# Patient Record
Sex: Male | Born: 1955
Health system: Southern US, Community
[De-identification: ages and names within clinical notes are randomized; demographics above are authoritative.]

## PROBLEM LIST (undated history)

## (undated) DIAGNOSIS — Z5189 Encounter for other specified aftercare: Secondary | ICD-10-CM

## (undated) DIAGNOSIS — F329 Major depressive disorder, single episode, unspecified: Secondary | ICD-10-CM

## (undated) DIAGNOSIS — M1990 Unspecified osteoarthritis, unspecified site: Secondary | ICD-10-CM

## (undated) DIAGNOSIS — K219 Gastro-esophageal reflux disease without esophagitis: Secondary | ICD-10-CM

## (undated) DIAGNOSIS — N189 Chronic kidney disease, unspecified: Secondary | ICD-10-CM

## (undated) DIAGNOSIS — D649 Anemia, unspecified: Secondary | ICD-10-CM

## (undated) DIAGNOSIS — F419 Anxiety disorder, unspecified: Secondary | ICD-10-CM

## (undated) DIAGNOSIS — T7840XA Allergy, unspecified, initial encounter: Secondary | ICD-10-CM

## (undated) DIAGNOSIS — I10 Essential (primary) hypertension: Secondary | ICD-10-CM

## (undated) DIAGNOSIS — E785 Hyperlipidemia, unspecified: Secondary | ICD-10-CM

## (undated) DIAGNOSIS — H269 Unspecified cataract: Secondary | ICD-10-CM

## (undated) HISTORY — DX: Anxiety disorder, unspecified: F41.9

## (undated) HISTORY — DX: Hyperlipidemia, unspecified: E78.5

## (undated) HISTORY — DX: Allergy, unspecified, initial encounter: T78.40XA

## (undated) HISTORY — DX: Gastro-esophageal reflux disease without esophagitis: K21.9

## (undated) HISTORY — DX: Chronic kidney disease, unspecified: N18.9

## (undated) HISTORY — DX: Major depressive disorder, single episode, unspecified: F32.9

## (undated) HISTORY — DX: Unspecified osteoarthritis, unspecified site: M19.90

## (undated) HISTORY — DX: Anemia, unspecified: D64.9

## (undated) HISTORY — DX: Encounter for other specified aftercare: Z51.89

## (undated) HISTORY — PX: COLON SURGERY: SHX602

## (undated) HISTORY — DX: Unspecified cataract: H26.9

---
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
## 1999-11-09 DIAGNOSIS — F32A Depression, unspecified: Secondary | ICD-10-CM

## 1999-11-09 HISTORY — DX: Depression, unspecified: F32.A

## 2003-11-09 DIAGNOSIS — I10 Essential (primary) hypertension: Secondary | ICD-10-CM

## 2003-11-09 HISTORY — DX: Essential (primary) hypertension: I10

## 2004-11-08 HISTORY — PX: HERNIA REPAIR: SHX51

## 2011-05-08 ENCOUNTER — Emergency Department (HOSPITAL_COMMUNITY)
Admission: EM | Admit: 2011-05-08 | Discharge: 2011-05-09 | Disposition: A | Discharge status: Home / Self Care | Payer: Self-pay | Attending: Emergency Medicine | Admitting: Emergency Medicine

## 2011-05-08 DIAGNOSIS — L255 Unspecified contact dermatitis due to plants, except food: Secondary | ICD-10-CM | POA: Insufficient documentation

## 2011-05-08 DIAGNOSIS — T622X1A Toxic effect of other ingested (parts of) plant(s), accidental (unintentional), initial encounter: Secondary | ICD-10-CM | POA: Insufficient documentation

## 2011-05-08 DIAGNOSIS — I10 Essential (primary) hypertension: Secondary | ICD-10-CM | POA: Insufficient documentation

## 2011-05-08 DIAGNOSIS — E119 Type 2 diabetes mellitus without complications: Secondary | ICD-10-CM | POA: Insufficient documentation

## 2011-05-20 ENCOUNTER — Encounter: Payer: Self-pay | Admitting: *Deleted

## 2011-05-20 ENCOUNTER — Emergency Department (HOSPITAL_COMMUNITY)
Admission: EM | Admit: 2011-05-20 | Discharge: 2011-05-20 | Disposition: A | Discharge status: Home / Self Care | Payer: Self-pay | Attending: Emergency Medicine | Admitting: Emergency Medicine

## 2011-05-20 DIAGNOSIS — R739 Hyperglycemia, unspecified: Secondary | ICD-10-CM

## 2011-05-20 DIAGNOSIS — E119 Type 2 diabetes mellitus without complications: Secondary | ICD-10-CM | POA: Insufficient documentation

## 2011-05-20 DIAGNOSIS — I10 Essential (primary) hypertension: Secondary | ICD-10-CM | POA: Insufficient documentation

## 2011-05-20 HISTORY — DX: Essential (primary) hypertension: I10

## 2011-05-20 LAB — GLUCOSE, CAPILLARY
Glucose-Capillary: 306 mg/dL — ABNORMAL HIGH (ref 70–99)
Glucose-Capillary: 278 mg/dL — ABNORMAL HIGH (ref 70–99)

## 2011-05-20 LAB — BASIC METABOLIC PANEL WITH GFR
BUN: 22 mg/dL (ref 6–23)
CO2: 30 meq/L (ref 19–32)
Calcium: 9.7 mg/dL (ref 8.4–10.5)
Chloride: 92 meq/L — ABNORMAL LOW (ref 96–112)
Creatinine, Ser: 1.16 mg/dL (ref 0.50–1.35)
GFR calc Af Amer: >60 mL/min
GFR calc non Af Amer: >60 mL/min
Glucose, Bld: 296 mg/dL — ABNORMAL HIGH (ref 70–99)
Potassium: 4.1 meq/L (ref 3.5–5.1)
Sodium: 130 meq/L — ABNORMAL LOW (ref 135–145)

## 2011-05-20 MED ORDER — SODIUM CHLORIDE 0.9 % IV SOLN: Freq: Once | INTRAVENOUS | Status: DC

## 2011-05-20 MED ORDER — METFORMIN HCL 500 MG PO TABS: 500.0000 mg | ORAL_TABLET | Freq: Two times a day (BID) | ORAL | Status: DC

## 2011-05-20 NOTE — ED Notes (Signed)
Seen  Here x 1 wk ago for poison ivy and given rx for prednisone - since taking, increased blood sugar with increased weakness and blurry vision.  Denies n/v/d.

## 2011-05-20 NOTE — ED Provider Notes (Signed)
History     Chief Complaint  Patient presents with  . Fatigue  . Blurred Vision   HPI Comments: Pt seen in ED 1 week ago for rash, dx poison ivy and put on course of prednisone. Prednisone finished but pt c/o worsening/persistent blurred vision, general weakness, polyuria, and polydipsia during this past week. No current meds for DM, but states his blood sugar has been higher than usual since the prednisone as well. No n/v/d, abd pain, ha, dizziness, cp, or sob. Poison ivy resolved.  Patient is a 55 y.o. male presenting with diabetes problem. The history is provided by the patient.  Diabetes Pertinent negatives for hypoglycemia include no dizziness. Associated symptoms include blurred vision, fatigue, polydipsia, polyuria and weakness. Pertinent negatives for diabetes include no chest pain, no foot paresthesias, no foot ulcerations, no visual change and no weight loss. When asked about current treatments, none were reported.    Past Medical History  Diagnosis Date  . Hypertension   . Diabetes mellitus     History reviewed. No pertinent past surgical history.  History reviewed. No pertinent family history.  History  Substance Use Topics  . Smoking status: Never Smoker   . Smokeless tobacco: Not on file  . Alcohol Use: No      Review of Systems  Constitutional: Positive for fatigue. Negative for weight loss.  HENT: Negative for congestion.   Eyes: Positive for blurred vision.  Respiratory: Negative for cough and shortness of breath.   Cardiovascular: Negative for chest pain.  Gastrointestinal: Negative for nausea, vomiting, abdominal pain and diarrhea.  Genitourinary: Positive for polyuria. Negative for dysuria.  Musculoskeletal: Negative for back pain.  Skin: Negative for rash.  Neurological: Positive for weakness. Negative for dizziness.  Hematological: Positive for polydipsia.  All other systems reviewed and are negative.    Physical Exam  BP 137/101  Pulse 75   Temp(Src) 98.8 F (37.1 C) (Oral)  Resp 16  Ht 5\' 8"  (1.727 m)  Wt 208 lb (94.348 kg)  BMI 31.63 kg/m2  SpO2 94%  Physical Exam  Nursing note and vitals reviewed. Constitutional: He is oriented to person, place, and time. He appears well-developed and well-nourished. No distress.  HENT:  Head: Normocephalic.  Mouth/Throat: Mucous membranes are normal.  Eyes:       Normal appearance  Neck: Normal range of motion. Neck supple.  Cardiovascular: Normal rate and regular rhythm.  Exam reveals no gallop and no friction rub.   No murmur heard. Pulmonary/Chest: Effort normal and breath sounds normal. He has no wheezes. He has no rhonchi. He has no rales.  Abdominal: Soft. There is no tenderness.  Musculoskeletal: Normal range of motion.       Normal appearance  Neurological: He is alert and oriented to person, place, and time.       Motor intact in all extremities  Skin: Skin is warm and dry. No rash noted.       Color normal  Psychiatric: He has a normal mood and affect.    ED Course  Procedures  MDM  pt given iv fluids and cbg repeated  cbg improving and pt feels better, will start on metformin and pt will f/u his pcp        Toy Baker, MD 05/20/11 2158

## 2011-05-20 NOTE — ED Notes (Signed)
Pt states he's feeling much better - blurred vision is gone and is not feeling as weak

## 2011-05-20 NOTE — ED Notes (Signed)
Pt placed on monitor.  

## 2013-04-26 ENCOUNTER — Other Ambulatory Visit (HOSPITAL_COMMUNITY): Payer: Self-pay | Admitting: *Deleted

## 2013-04-26 ENCOUNTER — Ambulatory Visit (HOSPITAL_COMMUNITY)
Admission: RE | Admit: 2013-04-26 | Discharge: 2013-04-26 | Disposition: A | Discharge status: Home / Self Care | Payer: Disability Insurance | Source: Ambulatory Visit | Attending: Physical Medicine and Rehabilitation | Admitting: Physical Medicine and Rehabilitation

## 2013-04-26 DIAGNOSIS — M545 Low back pain, unspecified: Secondary | ICD-10-CM | POA: Insufficient documentation

## 2013-04-26 DIAGNOSIS — M25519 Pain in unspecified shoulder: Secondary | ICD-10-CM | POA: Insufficient documentation

## 2013-04-26 DIAGNOSIS — R937 Abnormal findings on diagnostic imaging of other parts of musculoskeletal system: Secondary | ICD-10-CM | POA: Insufficient documentation

## 2013-04-26 DIAGNOSIS — M25569 Pain in unspecified knee: Secondary | ICD-10-CM | POA: Insufficient documentation

## 2016-02-10 ENCOUNTER — Encounter: Payer: Self-pay | Admitting: Family Medicine

## 2016-02-10 ENCOUNTER — Other Ambulatory Visit: Payer: Self-pay

## 2016-02-10 ENCOUNTER — Ambulatory Visit (INDEPENDENT_AMBULATORY_CARE_PROVIDER_SITE_OTHER): Payer: BLUE CROSS/BLUE SHIELD | Admitting: Family Medicine

## 2016-02-10 VITALS — BP 168/100 | HR 86 | Resp 18 | Ht 69.75 in | Wt 212.0 lb

## 2016-02-10 DIAGNOSIS — I10 Essential (primary) hypertension: Secondary | ICD-10-CM

## 2016-02-10 DIAGNOSIS — E1169 Type 2 diabetes mellitus with other specified complication: Secondary | ICD-10-CM | POA: Insufficient documentation

## 2016-02-10 DIAGNOSIS — Z23 Encounter for immunization: Secondary | ICD-10-CM

## 2016-02-10 DIAGNOSIS — K219 Gastro-esophageal reflux disease without esophagitis: Secondary | ICD-10-CM

## 2016-02-10 DIAGNOSIS — F418 Other specified anxiety disorders: Secondary | ICD-10-CM

## 2016-02-10 DIAGNOSIS — M791 Myalgia, unspecified site: Secondary | ICD-10-CM

## 2016-02-10 DIAGNOSIS — Z125 Encounter for screening for malignant neoplasm of prostate: Secondary | ICD-10-CM

## 2016-02-10 DIAGNOSIS — E559 Vitamin D deficiency, unspecified: Secondary | ICD-10-CM

## 2016-02-10 DIAGNOSIS — E790 Hyperuricemia without signs of inflammatory arthritis and tophaceous disease: Secondary | ICD-10-CM | POA: Diagnosis not present

## 2016-02-10 DIAGNOSIS — Z1159 Encounter for screening for other viral diseases: Secondary | ICD-10-CM

## 2016-02-10 DIAGNOSIS — F419 Anxiety disorder, unspecified: Secondary | ICD-10-CM

## 2016-02-10 DIAGNOSIS — M2550 Pain in unspecified joint: Secondary | ICD-10-CM

## 2016-02-10 DIAGNOSIS — Z139 Encounter for screening, unspecified: Secondary | ICD-10-CM

## 2016-02-10 DIAGNOSIS — E119 Type 2 diabetes mellitus without complications: Secondary | ICD-10-CM

## 2016-02-10 DIAGNOSIS — F329 Major depressive disorder, single episode, unspecified: Secondary | ICD-10-CM

## 2016-02-10 DIAGNOSIS — E669 Obesity, unspecified: Secondary | ICD-10-CM

## 2016-02-10 LAB — HEMOGLOBIN A1C
HEMOGLOBIN A1C: 8.4 % — AB (ref <5.7)
MEAN PLASMA GLUCOSE: 194 mg/dL

## 2016-02-10 LAB — COMPLETE METABOLIC PANEL WITH GFR
ALT: 14 U/L (ref 9–46)
AST: 12 U/L (ref 10–35)
Albumin: 4.1 g/dL (ref 3.6–5.1)
Alkaline Phosphatase: 67 U/L (ref 40–115)
BUN: 18 mg/dL (ref 7–25)
CHLORIDE: 104 mmol/L (ref 98–110)
CO2: 28 mmol/L (ref 20–31)
CREATININE: 1.09 mg/dL (ref 0.70–1.33)
Calcium: 9.2 mg/dL (ref 8.6–10.3)
GFR, Est African American: 85 mL/min (ref >=60)
GFR, Est Non African American: 74 mL/min (ref >=60)
GLUCOSE: 138 mg/dL — AB (ref 65–99)
POTASSIUM: 4.4 mmol/L (ref 3.5–5.3)
SODIUM: 139 mmol/L (ref 135–146)
Total Bilirubin: 0.7 mg/dL (ref 0.2–1.2)
Total Protein: 6.9 g/dL (ref 6.1–8.1)

## 2016-02-10 LAB — CBC
HCT: 43.8 % (ref 38.5–50.0)
Hemoglobin: 14.4 g/dL (ref 13.2–17.1)
MCH: 28.7 pg (ref 27.0–33.0)
MCHC: 32.9 g/dL (ref 32.0–36.0)
MCV: 87.3 fL (ref 80.0–100.0)
MPV: 9 fL (ref 7.5–12.5)
PLATELETS: 292 10*3/uL (ref 140–400)
RBC: 5.02 MIL/uL (ref 4.20–5.80)
RDW: 14.7 % (ref 11.0–15.0)
WBC: 7 10*3/uL (ref 3.8–10.8)

## 2016-02-10 LAB — LIPID PANEL
CHOL/HDL RATIO: 2.9 ratio (ref <=5.0)
CHOLESTEROL: 110 mg/dL — AB (ref 125–200)
HDL: 38 mg/dL — ABNORMAL LOW (ref >=40)
LDL Cholesterol: 50 mg/dL (ref <130)
Triglycerides: 110 mg/dL (ref <150)
VLDL: 22 mg/dL (ref <30)

## 2016-02-10 LAB — URIC ACID: Uric Acid, Serum: 6.8 mg/dL (ref 4.0–7.8)

## 2016-02-10 LAB — RHEUMATOID FACTOR: Rhuematoid fact SerPl-aCnc: 59 IU/mL — ABNORMAL HIGH (ref <=14)

## 2016-02-10 LAB — TSH: TSH: 1.61 m[IU]/L (ref 0.40–4.50)

## 2016-02-10 MED ORDER — BUSPIRONE HCL 5 MG PO TABS: 5.0000 mg | ORAL_TABLET | Freq: Two times a day (BID) | ORAL | Status: DC

## 2016-02-10 MED ORDER — FLUOXETINE HCL 20 MG PO TABS: 20.0000 mg | ORAL_TABLET | Freq: Every day | ORAL | Status: DC

## 2016-02-10 MED ORDER — AMLODIPINE BESYLATE 10 MG PO TABS: 10.0000 mg | ORAL_TABLET | Freq: Every day | ORAL | Status: DC

## 2016-02-10 NOTE — Patient Instructions (Addendum)
F/u in 4 weeks , call if you need me sooner  New medication for blood pressure, amlodipine 10 mg dailty  New  For depression and anxiety are fluoxetine and buspar  EKG and urine to be checked today  You are referred for diabetic eye exam  Labs today  Thank you  for choosing [facility]. We consider it a privelige to serve you.  Delivering excellent health care in a caring and  compassionate way is our goal.  Partnering with you,  so that together we can achieve this goal is our strategy.   Pneumonia 23 vaccine today

## 2016-02-11 LAB — MICROALBUMIN / CREATININE URINE RATIO
Creatinine, Urine: 177 mg/dL (ref 20–370)
Microalb Creat Ratio: 629 mcg/mg creat — ABNORMAL HIGH (ref <30)
Microalb, Ur: 111.4 mg/dL

## 2016-02-11 LAB — HEPATITIS C ANTIBODY: HCV AB: REACTIVE — AB

## 2016-02-11 LAB — HIV ANTIBODY (ROUTINE TESTING W REFLEX): HIV: NONREACTIVE

## 2016-02-11 LAB — VITAMIN D 25 HYDROXY (VIT D DEFICIENCY, FRACTURES): VIT D 25 HYDROXY: 13 ng/mL — AB (ref 30–100)

## 2016-02-11 LAB — SEDIMENTATION RATE: Sed Rate: 1 mm/hr (ref 0–20)

## 2016-02-11 LAB — PSA: PSA: 1.65 ng/mL (ref <=4.00)

## 2016-02-12 ENCOUNTER — Encounter: Payer: Self-pay | Admitting: Family Medicine

## 2016-02-12 DIAGNOSIS — M2550 Pain in unspecified joint: Secondary | ICD-10-CM | POA: Insufficient documentation

## 2016-02-12 DIAGNOSIS — K219 Gastro-esophageal reflux disease without esophagitis: Secondary | ICD-10-CM | POA: Insufficient documentation

## 2016-02-12 DIAGNOSIS — Z23 Encounter for immunization: Secondary | ICD-10-CM | POA: Insufficient documentation

## 2016-02-12 LAB — HEPATITIS C RNA QUANTITATIVE: HCV Quantitative: NOT DETECTED IU/mL (ref <15)

## 2016-02-12 NOTE — Assessment & Plan Note (Signed)
Reports being diagnosed in the past with rheumatoid arthritis, c/o pain in multiple joints. Will refer to rheumatologist for evaluation and mangement

## 2016-02-12 NOTE — Assessment & Plan Note (Signed)
Has been treated in the past with fluoxetine and buspar , medication resumed

## 2016-02-12 NOTE — Assessment & Plan Note (Signed)
Controlled, no change in medication  

## 2016-02-12 NOTE — Assessment & Plan Note (Addendum)
Uncontrolled DASH diet and commitment to daily physical activity for a minimum of 30 minutes discussed and encouraged, as a part of hypertension management. The importance of attaining a healthy weight is also discussed. EKG: NSR, no ischemia, mild LVH  BP/Weight 02/10/2016 [id number]  Systolic BP [id number]  Diastolic BP 123XX123 76  Wt. (Lbs) 212 208  BMI 30.63 31.63

## 2016-02-12 NOTE — Assessment & Plan Note (Addendum)
Uncontrolled, pt will be contacted with dose change once results are available Norman Peters is reminded of the importance of commitment to daily physical activity for 30 minutes or more, as able and the need to limit carbohydrate intake to 30 to 60 grams per meal to help with blood sugar control.   The need to take medication as prescribed, test blood sugar as directed, and to call between visits if there is a concern that blood sugar is uncontrolled is also discussed.   Norman Peters is reminded of the importance of daily foot exam, annual eye examination, and good blood sugar, blood pressure and cholesterol control.  Diabetic Labs Latest Ref Rng 02/10/2016 05/20/2011  HbA1c <5.7 % 8.4(H) -  Microalbumin Not estab mg/dL 111.4 -  Micro/Creat Ratio <30 mcg/mg creat 629(H) -  Chol 125 - 200 mg/dL 110(L) -  HDL >=40 mg/dL 38(L) -  Calc LDL <130 mg/dL 50 -  Triglycerides <150 mg/dL 110 -  Creatinine 0.70 - 1.33 mg/dL 1.09 1.16   BP/Weight 02/10/2016 [id number]  Systolic BP XX123456 [id number]  Diastolic BP 123XX123 76  Wt. (Lbs) 212 208  BMI 30.63 31.63   No flowsheet data found.

## 2016-02-12 NOTE — Assessment & Plan Note (Signed)
After obtaining informed consent, the vaccine is  administered by LPN.  

## 2016-02-12 NOTE — Progress Notes (Signed)
Subjective:    Patient ID: Norman Peters, male    DOB: 1955/12/25, 60 y.o.   MRN: [id number]  HPI Pt in to establish care. Medical h/o depression, anxiety, past nicotine ,drug and alcohol use, currently has c/o depression and anxiety, has hypertension and diabetes and c/o sebre joint pain and stiffness affecting multiple joints Denies polyuria, polydipsia, blurred vision , or hypoglycemic episodes.   Review of Systems See HPI Denies recent fever or chills. Denies sinus pressure, nasal congestion, ear pain or sore throat. Denies chest congestion, productive cough or wheezing. Denies chest pains, palpitations and leg swelling Denies abdominal pain, nausea, vomiting,diarrhea or constipation.   Denies dysuria, frequency, hesitancy or incontinence.  Denies headaches, seizures, numbness, or tingling.  Denies skin break down or rash.        Objective:   Physical Exam  BP 168/100 mmHg  Pulse 86  Resp 18  Ht 5' 9.75" (1.772 m)  Wt 212 lb (96.163 kg)  BMI 30.63 kg/m2  SpO2 96% Patient alert and oriented and in no cardiopulmonary distress.  HEENT: No facial asymmetry, EOMI,   oropharynx pink and moist.  Neck supple no JVD, no mass.  Chest: Clear to auscultation bilaterally.  CVS: S1, S2 no murmurs, no S3.Regular rate.  ABD: Soft non tender.   Ext: No edema  MS: decreased  ROM spine, shoulders, hips and knees.  Skin: Intact, no ulcerations or rash noted.  Psych: Good eye contact, normal affect. Memory intact not anxious mildly depressed appearing.  CNS: CN 2-12 intact, power,  normal throughout.no focal deficits noted.       Assessment & Plan:  Hypertension goal BP (blood pressure) < 130/80 Uncontrolled DASH diet and commitment to daily physical activity for a minimum of 30 minutes discussed and encouraged, as a part of hypertension management. The importance of attaining a healthy weight is also discussed. EKG: NSR, no ischemia, mild LVH  BP/Weight 02/10/2016  [id number]  Systolic BP [id number] [id number]  Diastolic BP 123XX123 76  Wt. (Lbs) 212 208  BMI 30.63 31.63        Diabetes mellitus type 2 in obese (HCC) Uncontrolled, pt will be contacted with dose change once results are available Mr. Neer is reminded of the importance of commitment to daily physical activity for 30 minutes or more, as able and the need to limit carbohydrate intake to 30 to 60 grams per meal to help with blood sugar control.   The need to take medication as prescribed, test blood sugar as directed, and to call between visits if there is a concern that blood sugar is uncontrolled is also discussed.   Mr. Habetz is reminded of the importance of daily foot exam, annual eye examination, and good blood sugar, blood pressure and cholesterol control.  Diabetic Labs Latest Ref Rng 02/10/2016 05/20/2011  HbA1c <5.7 % 8.4(H) -  Microalbumin Not estab mg/dL 111.4 -  Micro/Creat Ratio <30 mcg/mg creat 629(H) -  Chol 125 - 200 mg/dL 110(L) -  HDL >=40 mg/dL 38(L) -  Calc LDL <130 mg/dL 50 -  Triglycerides <150 mg/dL 110 -  Creatinine 0.70 - 1.33 mg/dL 1.09 1.16   BP/Weight 02/10/2016 [id number]  Systolic BP XX123456 [id number]  Diastolic BP 123XX123 76  Wt. (Lbs) 212 208  BMI 30.63 31.63   No flowsheet data found.       Anxiety and depression Has been treated in the past with fluoxetine and buspar , medication resumed  Pain in joint involving multiple sites Reports being diagnosed  in the past with rheumatoid arthritis, c/o pain in multiple joints. Will refer to rheumatologist for evaluation and mangement

## 2016-02-26 ENCOUNTER — Other Ambulatory Visit: Payer: Self-pay

## 2016-02-26 DIAGNOSIS — E1169 Type 2 diabetes mellitus with other specified complication: Secondary | ICD-10-CM

## 2016-02-26 DIAGNOSIS — E669 Obesity, unspecified: Principal | ICD-10-CM

## 2016-02-26 MED ORDER — GLIPIZIDE 10 MG PO TABS: 10.0000 mg | ORAL_TABLET | Freq: Two times a day (BID) | ORAL | Status: DC

## 2016-03-08 ENCOUNTER — Telehealth: Payer: Self-pay | Admitting: Family Medicine

## 2016-03-08 NOTE — Telephone Encounter (Signed)
Noted  

## 2016-03-08 NOTE — Telephone Encounter (Signed)
Patient is stating that he has received labs results

## 2016-03-18 ENCOUNTER — Encounter: Payer: Self-pay | Admitting: Family Medicine

## 2016-03-18 ENCOUNTER — Other Ambulatory Visit: Payer: Self-pay | Admitting: Family Medicine

## 2016-03-18 ENCOUNTER — Ambulatory Visit (HOSPITAL_COMMUNITY)
Admission: RE | Admit: 2016-03-18 | Discharge: 2016-03-18 | Disposition: A | Discharge status: Home / Self Care | Payer: BLUE CROSS/BLUE SHIELD | Source: Ambulatory Visit | Attending: Family Medicine | Admitting: Family Medicine

## 2016-03-18 ENCOUNTER — Encounter (INDEPENDENT_AMBULATORY_CARE_PROVIDER_SITE_OTHER): Payer: Self-pay | Admitting: *Deleted

## 2016-03-18 ENCOUNTER — Ambulatory Visit (INDEPENDENT_AMBULATORY_CARE_PROVIDER_SITE_OTHER): Payer: BLUE CROSS/BLUE SHIELD | Admitting: Family Medicine

## 2016-03-18 VITALS — BP 150/94 | HR 78 | Resp 16 | Ht 70.0 in | Wt 211.4 lb

## 2016-03-18 DIAGNOSIS — F418 Other specified anxiety disorders: Secondary | ICD-10-CM

## 2016-03-18 DIAGNOSIS — M5440 Lumbago with sciatica, unspecified side: Secondary | ICD-10-CM | POA: Insufficient documentation

## 2016-03-18 DIAGNOSIS — K219 Gastro-esophageal reflux disease without esophagitis: Secondary | ICD-10-CM

## 2016-03-18 DIAGNOSIS — M5430 Sciatica, unspecified side: Secondary | ICD-10-CM

## 2016-03-18 DIAGNOSIS — Z1211 Encounter for screening for malignant neoplasm of colon: Secondary | ICD-10-CM

## 2016-03-18 DIAGNOSIS — E119 Type 2 diabetes mellitus without complications: Secondary | ICD-10-CM | POA: Diagnosis not present

## 2016-03-18 DIAGNOSIS — M2550 Pain in unspecified joint: Secondary | ICD-10-CM

## 2016-03-18 DIAGNOSIS — M545 Low back pain: Secondary | ICD-10-CM | POA: Diagnosis not present

## 2016-03-18 DIAGNOSIS — E669 Obesity, unspecified: Secondary | ICD-10-CM

## 2016-03-18 DIAGNOSIS — I10 Essential (primary) hypertension: Secondary | ICD-10-CM

## 2016-03-18 DIAGNOSIS — F329 Major depressive disorder, single episode, unspecified: Secondary | ICD-10-CM

## 2016-03-18 DIAGNOSIS — F419 Anxiety disorder, unspecified: Secondary | ICD-10-CM

## 2016-03-18 DIAGNOSIS — E1169 Type 2 diabetes mellitus with other specified complication: Secondary | ICD-10-CM

## 2016-03-18 MED ORDER — GABAPENTIN 300 MG PO CAPS: 300.0000 mg | ORAL_CAPSULE | Freq: Every day | ORAL | Status: DC

## 2016-03-18 MED ORDER — SPIRONOLACTONE 50 MG PO TABS: 50.0000 mg | ORAL_TABLET | Freq: Every day | ORAL | Status: DC

## 2016-03-18 NOTE — Assessment & Plan Note (Signed)
Uncontrolled, add spironolactone DASH diet and commitment to daily physical activity for a minimum of 30 minutes discussed and encouraged, as a part of hypertension management. The importance of attaining a healthy weight is also discussed.  BP/Weight 03/18/2016 02/10/2016 [id number]  Systolic BP Q000111Q [id number]  Diastolic BP 94 123XX123 76  Wt. (Lbs) 211.4 212 208  BMI 30.33 30.63 31.63

## 2016-03-18 NOTE — Assessment & Plan Note (Signed)
Improved, continue  meds 

## 2016-03-18 NOTE — Progress Notes (Signed)
Subjective:    Patient ID: Norman Peters, male    DOB: February 09, 1956, 60 y.o.   MRN: [id number]  HPI   [name]     MRN: [id number]      DOB: November 26, 1955   HPI [name] is here for follow up and re-evaluation of chronic medical conditions, medication management and review of any available recent lab and radiology data.  Preventive health is updated, specifically  Cancer screening and Immunization.   . The PT denies any adverse reactions to current medications since the last visit. Reports fasting sugars around 110, had one low blood sugar episode but realized that he had taken his med and not eaten C/o generalized joint pain, uncontrolled   ROS Denies recent fever or chills. Denies sinus pressure, nasal congestion, ear pain or sore throat. Denies chest congestion, productive cough or wheezing. Denies chest pains, palpitations and leg swelling Denies abdominal pain, nausea, vomiting,diarrhea or constipation.   Denies dysuria, frequency, hesitancy or incontinence. C/o generalized joint pain, swelling and limitation in mobility.Disturbs sleep and had near all recently. Requests cane Denies headaches, seizures, numbness, or tingling. Denies uncontrolled  depression, anxiety or insomnia. Denies skin break down or rash.   PE  BP 150/94 mmHg  Pulse 78  Resp 16  Ht 5\' 10"  (1.778 m)  Wt 211 lb 6.4 oz (95.89 kg)  BMI 30.33 kg/m2  SpO2 98%  Patient alert and oriented and in no cardiopulmonary distress.Much improved affect, smiling, states feels much improved HEENT: No facial asymmetry, EOMI,   oropharynx pink and moist.  Neck supple no JVD, no mass.  Chest: Clear to auscultation bilaterally.  CVS: S1, S2 no murmurs, no S3.Regular rate.  ABD: Soft non tender.   Ext: No edema  MS: decreased ROM spine, shoulders, hips and knees.  Skin: Intact, no ulcerations or rash noted.  Psych: Good eye contact, normal affect. Memory intact not anxious or depressed appearing.  CNS: CN  2-12 intact, power,  normal throughout.no focal deficits noted.   Assessment & Plan  Diabetes mellitus type 2 in obese (HCC) Uncontrolled, but improved on increased  Medication , blood sugar has improved with current dose of meds, FBG avg around 110  [name] is reminded of the importance of commitment to daily physical activity for 30 minutes or more, as able and the need to limit carbohydrate intake to 30 to 60 grams per meal to help with blood sugar control.   The need to take medication as prescribed, test blood sugar as directed, and to call between visits if there is a concern that blood sugar is uncontrolled is also discussed.   Mr. [name] is reminded of the importance of daily foot exam, annual eye examination, and good blood sugar, blood pressure and cholesterol control.  Diabetic Labs Latest Ref Rng 02/10/2016 05/20/2011  HbA1c <5.7 % 8.4(H) -  Microalbumin Not estab mg/dL 111.4 -  Micro/Creat Ratio <30 mcg/mg creat 629(H) -  Chol 125 - 200 mg/dL 110(L) -  HDL >=40 mg/dL 38(L) -  Calc LDL <130 mg/dL 50 -  Triglycerides <150 mg/dL 110 -  Creatinine 0.70 - 1.33 mg/dL 1.09 1.16   BP/Weight 03/18/2016 02/10/2016 [id number]  Systolic BP [id number] [id number]  Diastolic BP 80 123XX123 76  Wt. (Lbs) 211.4 212 208  BMI 30.33 30.63 31.63   Foot/eye exam completion dates 02/10/2016  Foot Form Completion Done         Anxiety and depression Improved, continue meds  Hypertension goal BP (blood pressure) <  130/80 Uncontrolled, add spironolactone DASH diet and commitment to daily physical activity for a minimum of 30 minutes discussed and encouraged, as a part of hypertension management. The importance of attaining a healthy weight is also discussed.  BP/Weight 03/18/2016 02/10/2016 [id number]  Systolic BP Q000111Q [id number] [id number]  Diastolic BP 94 123XX123 76  Wt. (Lbs) 211.4 212 208  BMI 30.33 30.63 31.63        Pain in joint involving multiple sites X rays ordered of affected joints, both shoulders, both  knees and low back Fall precautions discussed rx for cane provided Start bed time gabapentin  Back pain of lumbar region with sciatica Script for cane and x ray of lumbar spine Start bed time gabapentin  GERD (gastroesophageal reflux disease) Controlled, no change in medication   Obesity Deteriorated. Patient re-educated about  the importance of commitment to a  minimum of 150 minutes of exercise per week.  The importance of healthy food choices with portion control discussed. Encouraged to start a food diary, count calories and to consider  joining a support group. Sample diet sheets offered. Goals set by the patient for the next several months.   Weight /BMI 03/18/2016 02/10/2016 05/20/2011  WEIGHT 211 lb 6.4 oz 212 lb 208 lb  HEIGHT 5\' 10"  5' 9.75" 5\' 8"   BMI 30.33 kg/m2 30.63 kg/m2 31.63 kg/m2    Current exercise per week 90 mins        Review of Systems     Objective:   Physical Exam        Assessment & Plan:

## 2016-03-18 NOTE — Assessment & Plan Note (Signed)
Uncontrolled, but improved on increased  Medication , blood sugar has improved with current dose of meds, FBG avg around 110  Norman Peters is reminded of the importance of commitment to daily physical activity for 30 minutes or more, as able and the need to limit carbohydrate intake to 30 to 60 grams per meal to help with blood sugar control.   The need to take medication as prescribed, test blood sugar as directed, and to call between visits if there is a concern that blood sugar is uncontrolled is also discussed.   Norman Peters is reminded of the importance of daily foot exam, annual eye examination, and good blood sugar, blood pressure and cholesterol control.  Diabetic Labs Latest Ref Rng 02/10/2016 05/20/2011  HbA1c <5.7 % 8.4(H) -  Microalbumin Not estab mg/dL 111.4 -  Micro/Creat Ratio <30 mcg/mg creat 629(H) -  Chol 125 - 200 mg/dL 110(L) -  HDL >=40 mg/dL 38(L) -  Calc LDL <130 mg/dL 50 -  Triglycerides <150 mg/dL 110 -  Creatinine 0.70 - 1.33 mg/dL 1.09 1.16   BP/Weight 03/18/2016 02/10/2016 [id number]  Systolic BP [id number] [id number]  Diastolic BP 80 123XX123 76  Wt. (Lbs) 211.4 212 208  BMI 30.33 30.63 31.63   Foot/eye exam completion dates 02/10/2016  Foot Form Completion Done

## 2016-03-18 NOTE — Patient Instructions (Addendum)
Nurse BP check June 13 or later that week, bP is still high  Annual physical exam in early September  Non fast chem 7 and EGFR and hBA1C 3 days before appt  New additional medication for blood pressure is spironolactone one daily, take in the morning with amlodipine  New for arthritis pain is gabapentin one at bedtime, this is sent to your pharmacy.Script for walking cane today, and be careful not to fall! Also take tylenol 500 mg once or twice daily  You are referred for a colonoscopy to Dr Laural Golden , his office will mail you a letter to call in for appt  Xrays are being ordered for both knees, lumbar spine and both shopulders, you may get them today  Thank you  for choosing [facility]. We consider it a privelige to serve you.  Delivering excellent health care in a caring and  compassionate way is our goal.  Partnering with you,  so that together we can achieve this goal is our strategy.

## 2016-03-21 DIAGNOSIS — E669 Obesity, unspecified: Secondary | ICD-10-CM | POA: Insufficient documentation

## 2016-03-21 NOTE — Assessment & Plan Note (Signed)
X rays ordered of affected joints, both shoulders, both knees and low back Fall precautions discussed rx for cane provided Start bed time gabapentin

## 2016-03-21 NOTE — Assessment & Plan Note (Signed)
Controlled, no change in medication  

## 2016-03-21 NOTE — Assessment & Plan Note (Signed)
Script for cane and x ray of lumbar spine Start bed time gabapentin

## 2016-03-21 NOTE — Assessment & Plan Note (Signed)
Deteriorated. Patient re-educated about  the importance of commitment to a  minimum of 150 minutes of exercise per week.  The importance of healthy food choices with portion control discussed. Encouraged to start a food diary, count calories and to consider  joining a support group. Sample diet sheets offered. Goals set by the patient for the next several months.   Weight /BMI 03/18/2016 02/10/2016 05/20/2011  WEIGHT 211 lb 6.4 oz 212 lb 208 lb  HEIGHT 5\' 10"  5' 9.75" 5\' 8"   BMI 30.33 kg/m2 30.63 kg/m2 31.63 kg/m2    Current exercise per week 90 mins

## 2016-03-25 ENCOUNTER — Other Ambulatory Visit (INDEPENDENT_AMBULATORY_CARE_PROVIDER_SITE_OTHER): Payer: Self-pay | Admitting: *Deleted

## 2016-03-25 DIAGNOSIS — Z1211 Encounter for screening for malignant neoplasm of colon: Secondary | ICD-10-CM

## 2016-04-21 ENCOUNTER — Ambulatory Visit: Payer: BLUE CROSS/BLUE SHIELD

## 2016-04-21 VITALS — BP 124/84

## 2016-04-21 DIAGNOSIS — I10 Essential (primary) hypertension: Secondary | ICD-10-CM

## 2016-04-21 LAB — HM DIABETES EYE EXAM

## 2016-04-21 NOTE — Progress Notes (Signed)
BP much improved. Advised to stay on the same meds and keep his follow up appt

## 2016-05-14 ENCOUNTER — Other Ambulatory Visit (INDEPENDENT_AMBULATORY_CARE_PROVIDER_SITE_OTHER): Payer: Self-pay | Admitting: *Deleted

## 2016-05-14 ENCOUNTER — Encounter (INDEPENDENT_AMBULATORY_CARE_PROVIDER_SITE_OTHER): Payer: Self-pay | Admitting: *Deleted

## 2016-05-14 NOTE — Telephone Encounter (Signed)
Patient needs trilyte 

## 2016-05-17 MED ORDER — PEG 3350-KCL-NA BICARB-NACL 420 G PO SOLR: 4000.0000 mL | Freq: Once | ORAL | Status: DC

## 2016-06-01 ENCOUNTER — Other Ambulatory Visit: Payer: Self-pay | Admitting: Family Medicine

## 2016-06-01 DIAGNOSIS — F419 Anxiety disorder, unspecified: Principal | ICD-10-CM

## 2016-06-01 DIAGNOSIS — F329 Major depressive disorder, single episode, unspecified: Secondary | ICD-10-CM

## 2016-06-12 ENCOUNTER — Other Ambulatory Visit: Payer: Self-pay | Admitting: Family Medicine

## 2016-06-12 DIAGNOSIS — F32A Depression, unspecified: Secondary | ICD-10-CM

## 2016-06-12 DIAGNOSIS — F419 Anxiety disorder, unspecified: Principal | ICD-10-CM

## 2016-06-12 DIAGNOSIS — F329 Major depressive disorder, single episode, unspecified: Secondary | ICD-10-CM

## 2016-06-16 ENCOUNTER — Telehealth (INDEPENDENT_AMBULATORY_CARE_PROVIDER_SITE_OTHER): Payer: Self-pay | Admitting: *Deleted

## 2016-06-16 NOTE — Telephone Encounter (Signed)
Referring MD/PCP: simpson   Procedure: tcs  Reason/Indication:  screening  Has patient had this procedure before?  no  If so, when, by whom and where?    Is there a family history of colon cancer?  no  Who?  What age when diagnosed?    Is patient diabetic?   yes      Does patient have prosthetic heart valve or mechanical valve?  no  Do you have a pacemaker?  no  Has patient ever had endocarditis? no  Has patient had joint replacement within last 12 months?  no  Does patient tend to be constipated or take laxatives? no  Does patient have a history of alcohol/drug use?  no  Is patient on Coumadin, Plavix and/or Aspirin? no  Medications: amlodipine 10 mg daily, buspirone 5 mg bid, fluoxetine 20 mg daily, gabapentin 300 mg daily, glipizide 10 mg bid, metformin 500 mg bid, rantidine 300 mg prn, spironolactone 50 mg daily, vit d daily  Allergies: ibuprofen, lisinopril  Medication Adjustment: hold glipizide & metformin evening before & morning of  Procedure date & time: 07/07/16 at 1030

## 2016-06-17 IMAGING — DX DG SHOULDER 2+V*L*
4 series · 4 of 4 positions shown · non-contrast
Comparison: 04/26/2013

CLINICAL DATA: Pain in joints

EXAM:
LEFT SHOULDER - 2+ VIEW

[shoulder grashey]
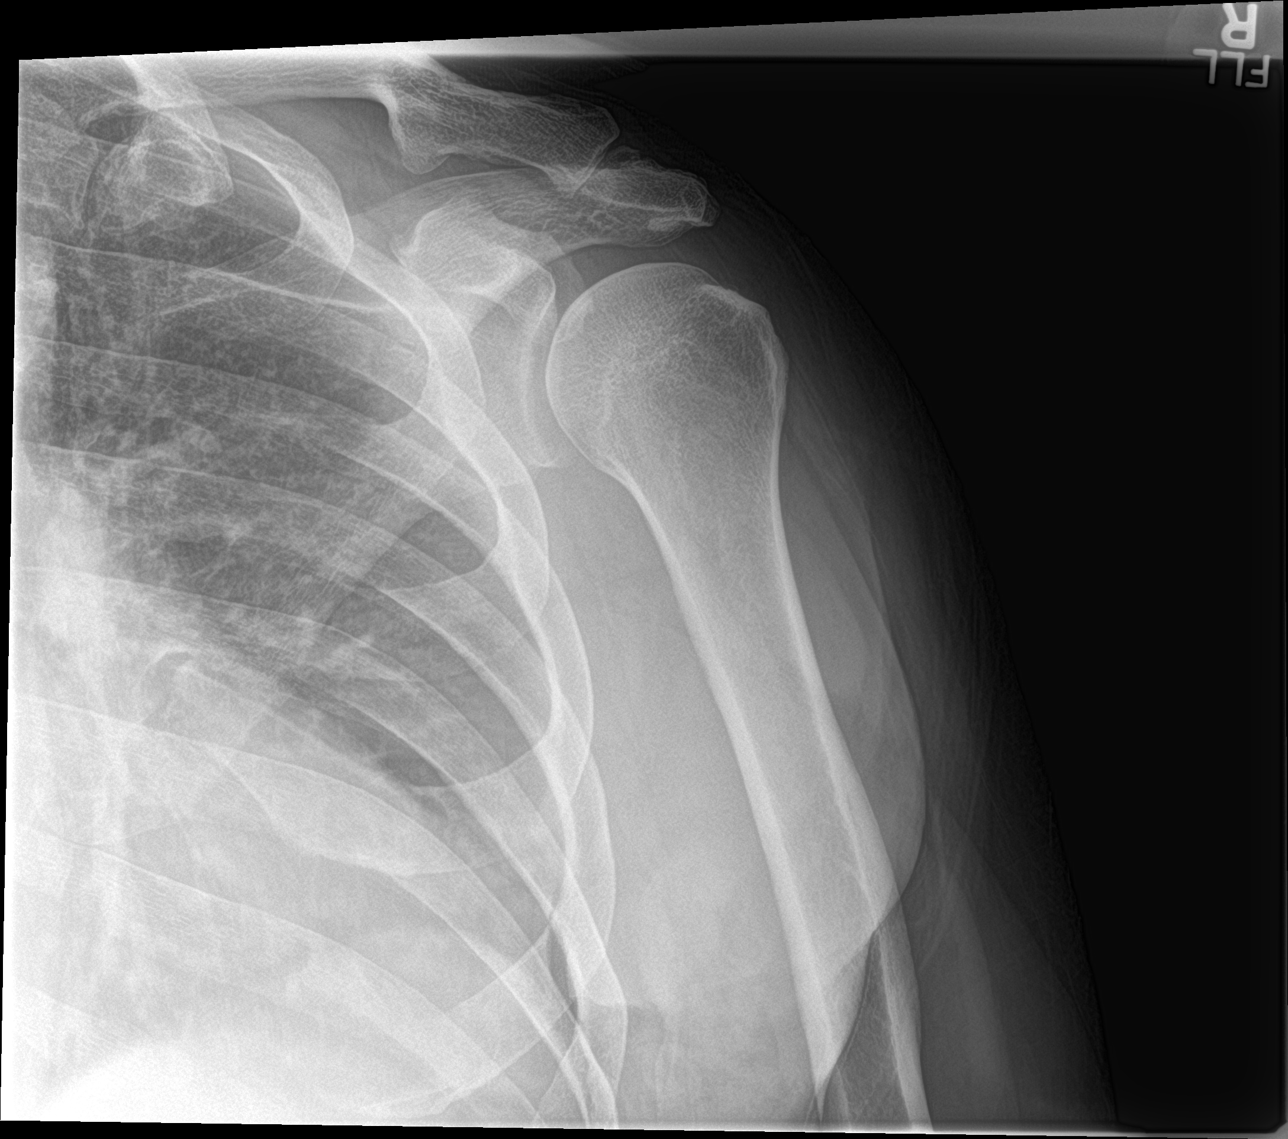

[shoulder y view]
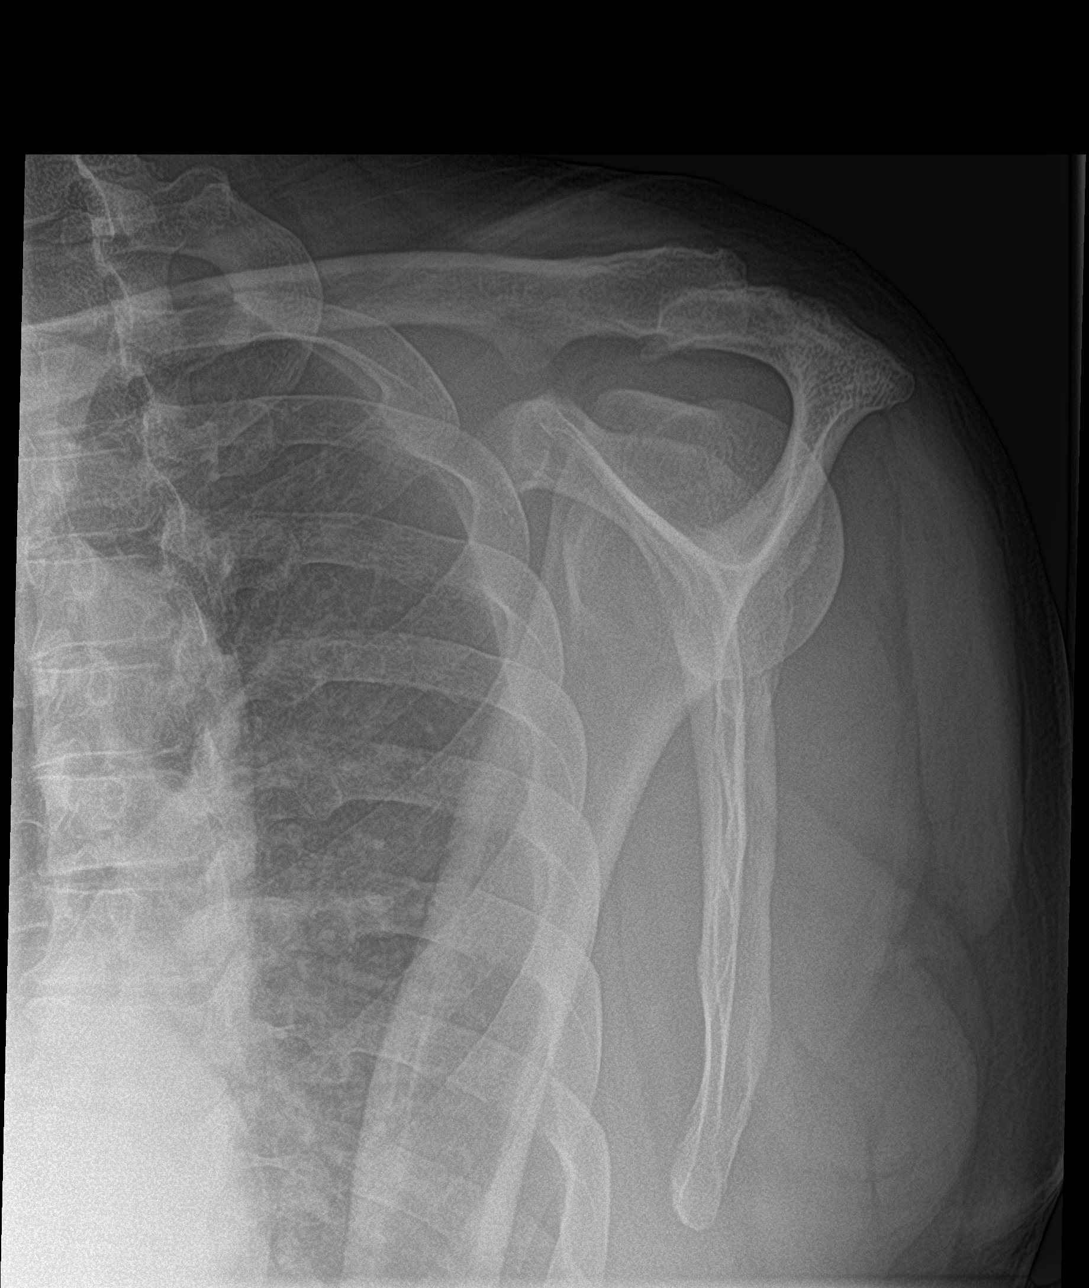

[shoulder ap neutral]
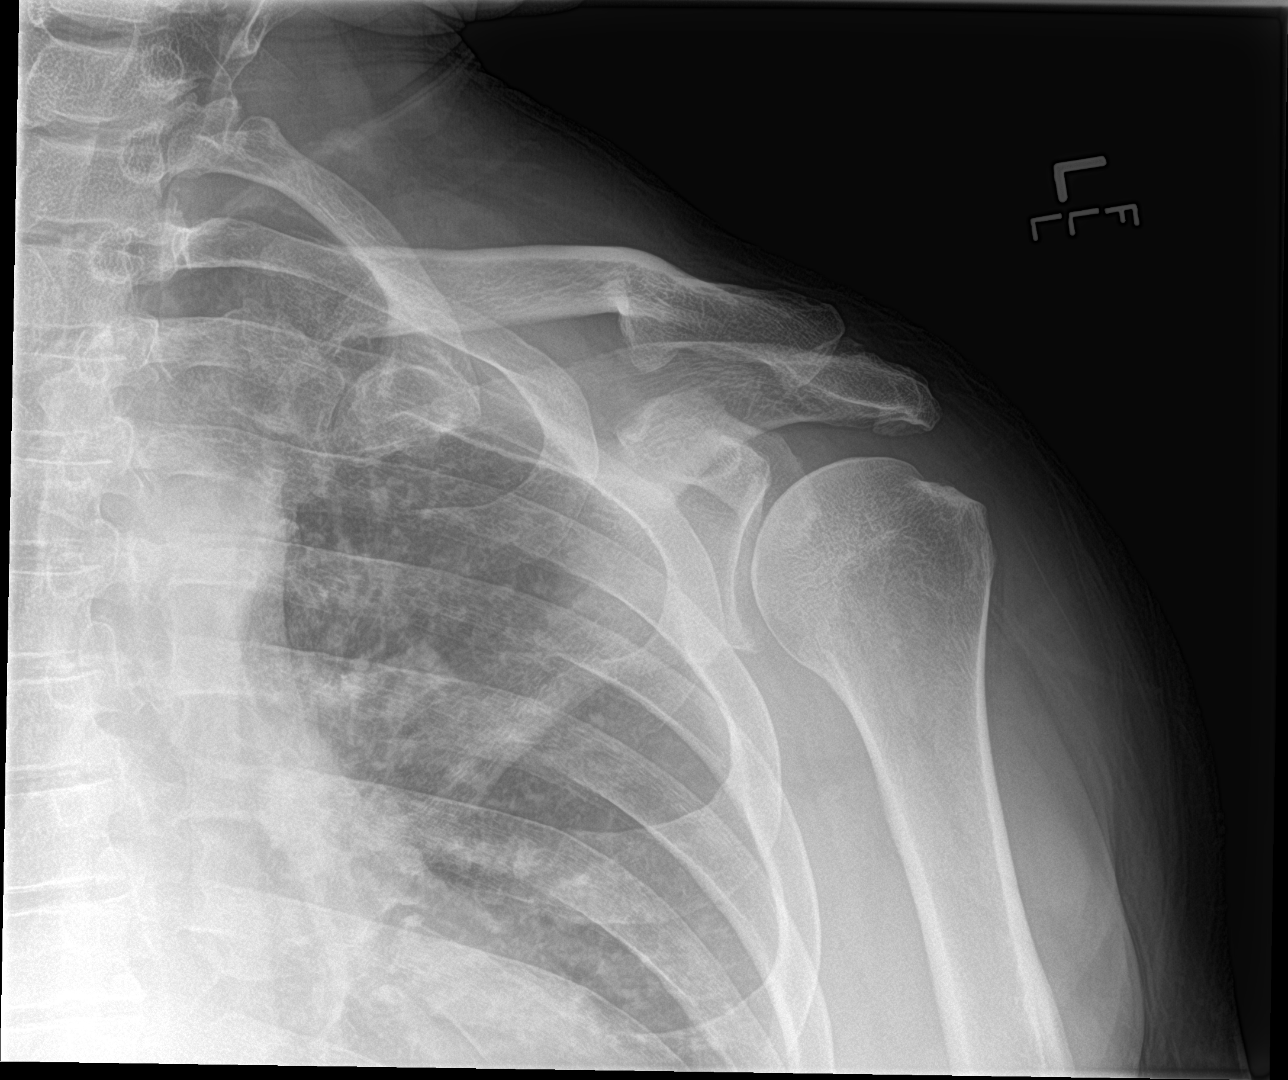

[shoulder axillary]
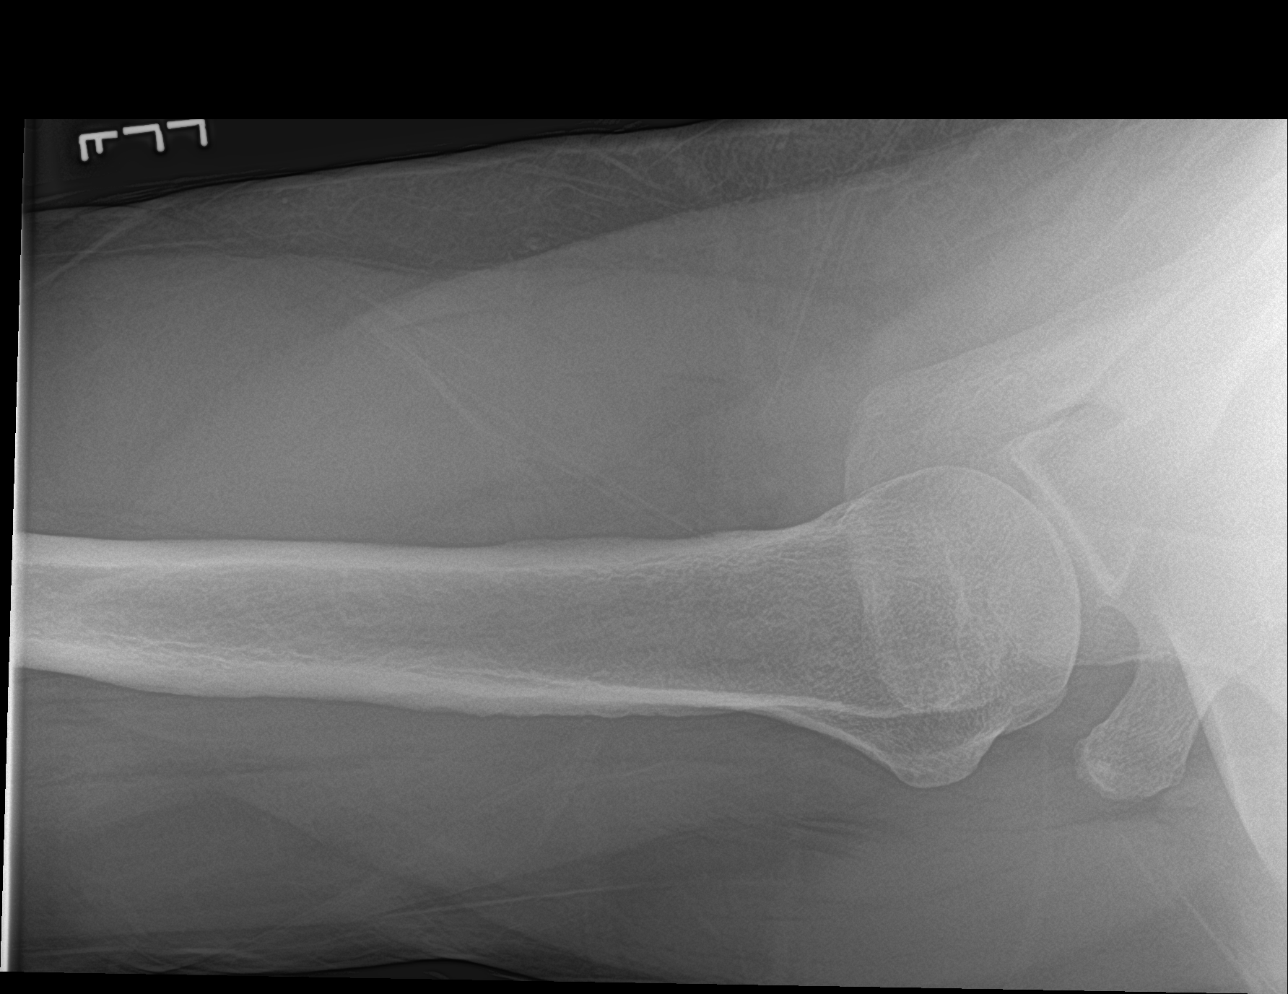

[4 of 4 positions shown; findings below may reference images not displayed]

FINDINGS: Osseous demineralization.

Minimal degenerative changes LEFT AC joint.

No acute fracture, dislocation or bone destruction.

LEFT ribs unremarkable.
IMPRESSION: No acute abnormalities.

Degenerative changes LEFT AC joint.

## 2016-06-17 IMAGING — DX DG SHOULDER 2+V*R*
4 series · 4 of 4 positions shown · non-contrast
Comparison: None

CLINICAL DATA: Pain in joints

EXAM:
RIGHT SHOULDER - 2+ VIEW

[shoulder grashey (1 of 2)]
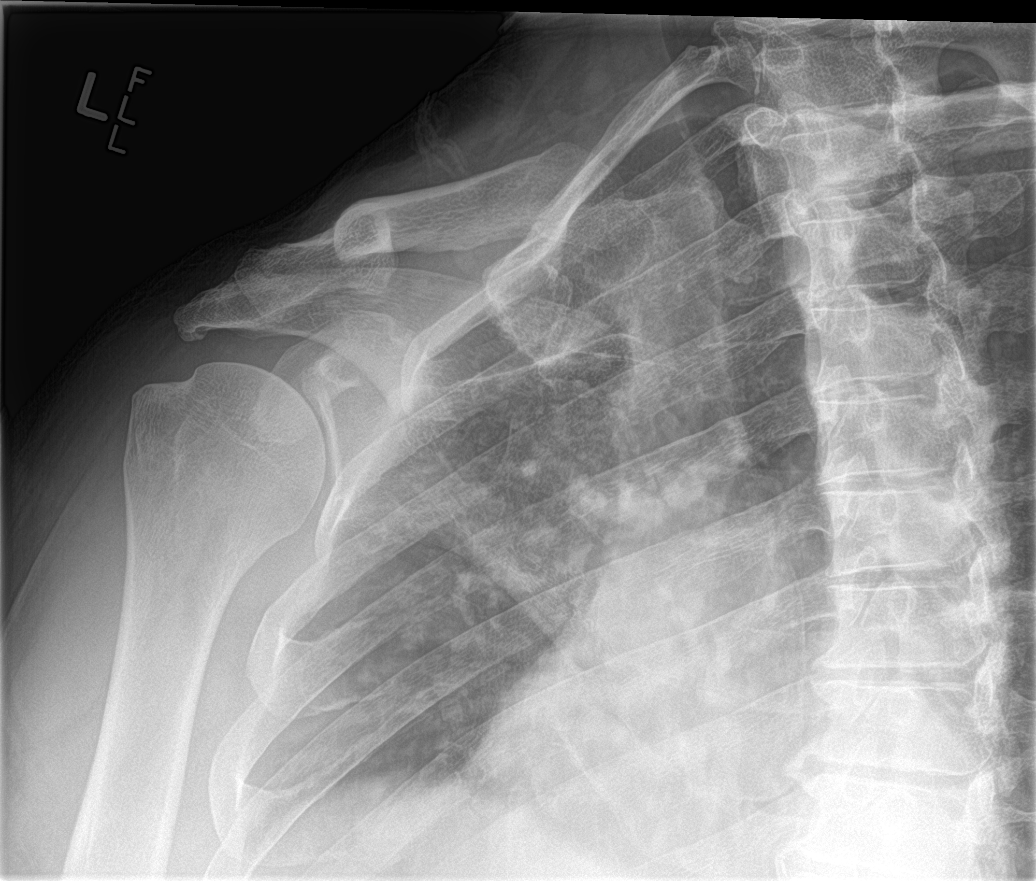

[shoulder y view]
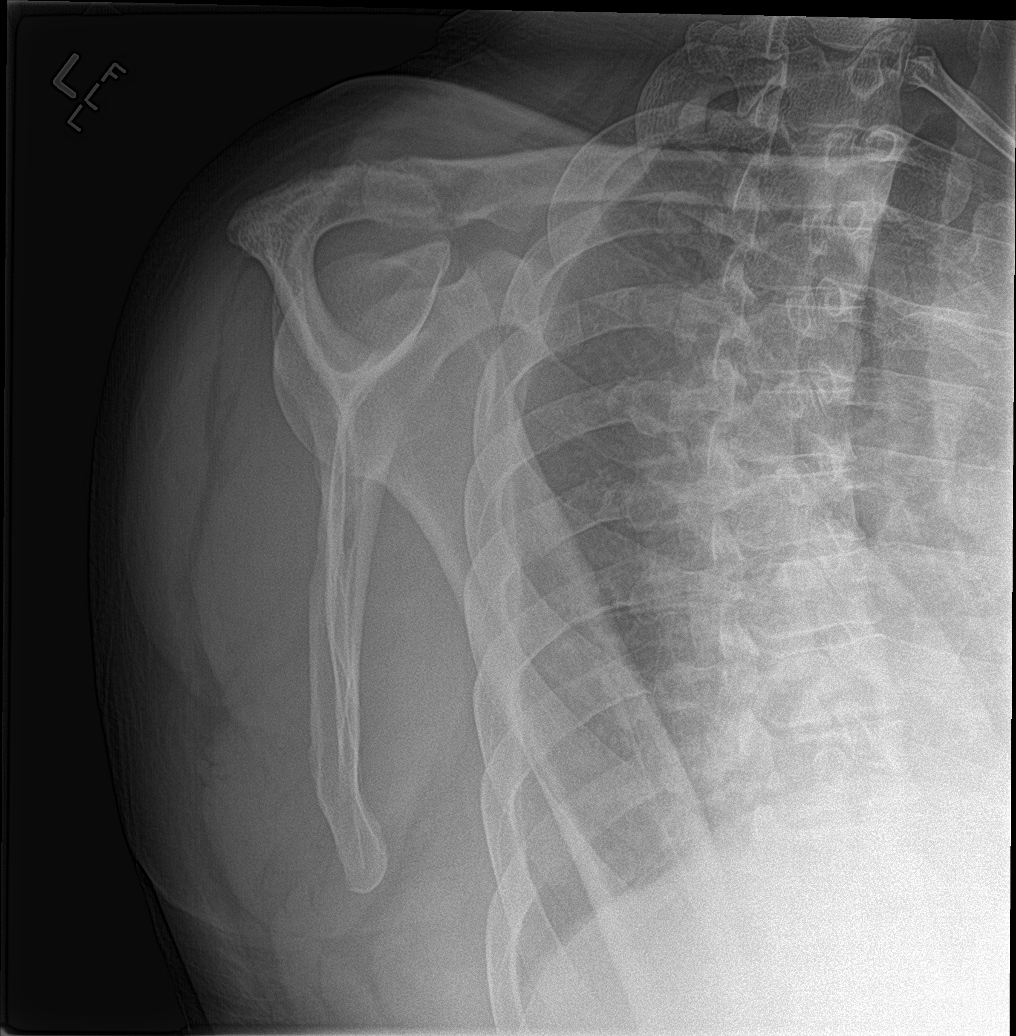

[shoulder grashey (2 of 2)]
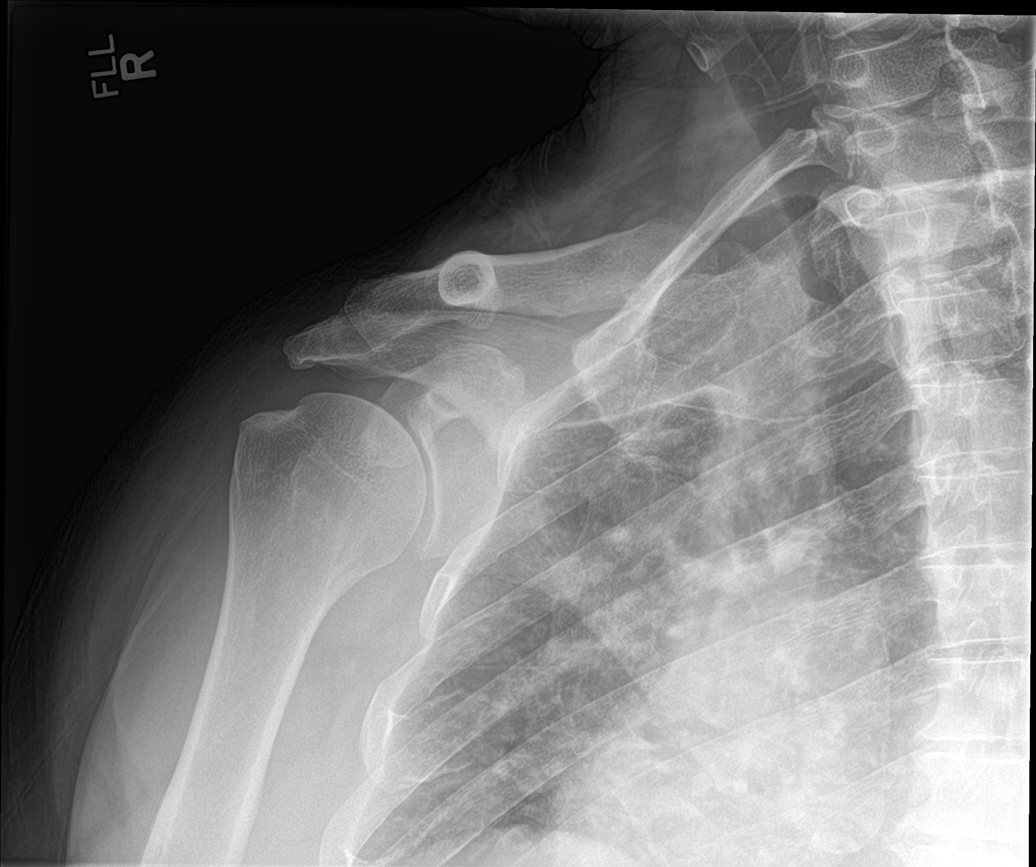

[shoulder axillary]
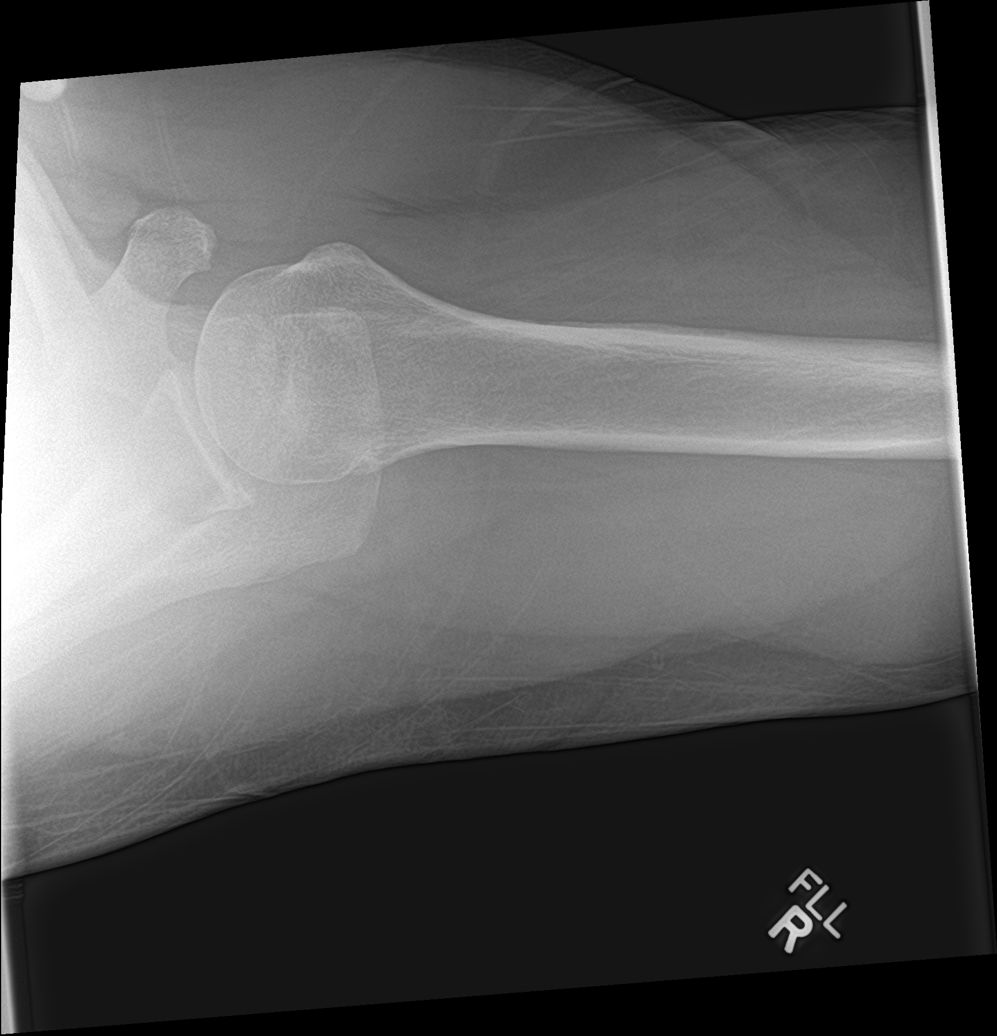

[4 of 4 positions shown; findings below may reference images not displayed]

FINDINGS: Osseous mineralization mildly decreased.

AC joint alignment normal.

Inferior acromial spur formation.

No acute fracture, dislocation, or bone destruction.

Minimal glenohumeral joint space narrowing.

Visualized RIGHT ribs intact.
IMPRESSION: Mild RIGHT glenohumeral degenerative changes.

## 2016-06-17 IMAGING — DX DG LUMBAR SPINE COMPLETE 4+V
5 series · 5 of 5 positions shown · non-contrast
Comparison: 04/26/2013

CLINICAL DATA: Pain joints, low back pain, no injury

EXAM:
LUMBAR SPINE - COMPLETE 4+ VIEW

[l-spine ap]
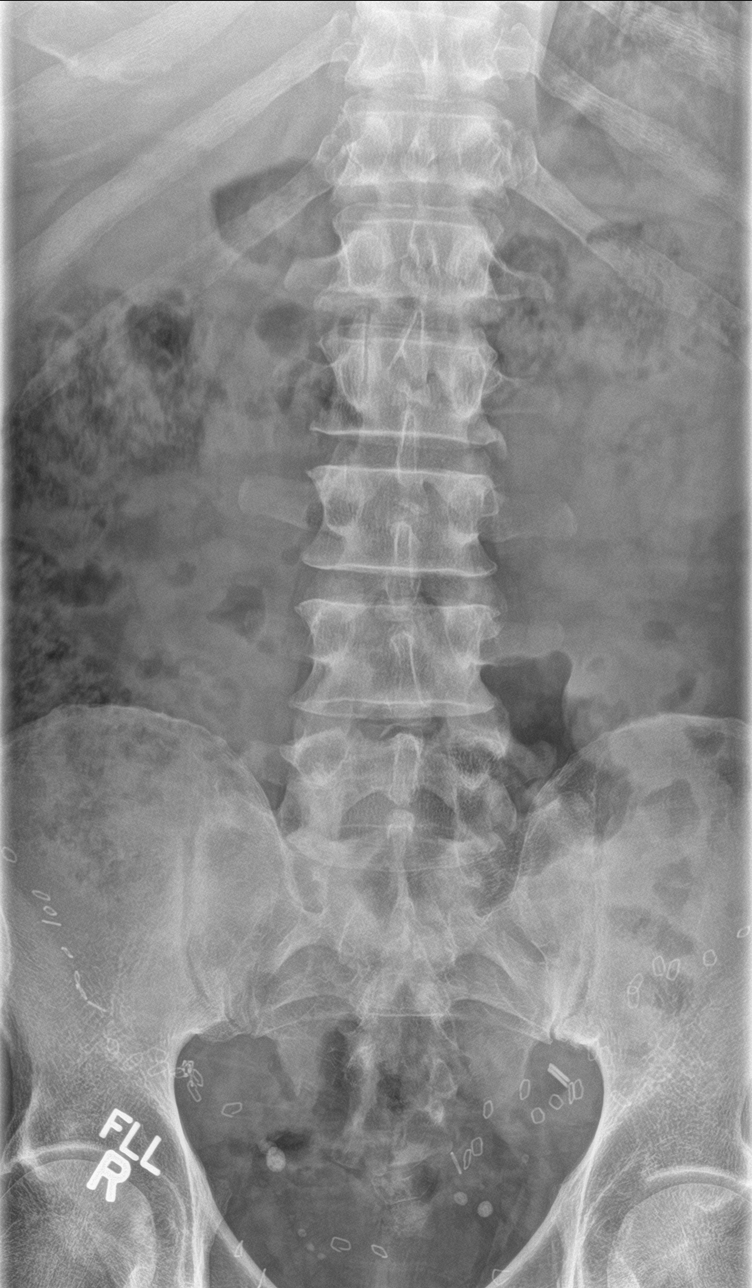

[l-spine obl (1 of 2)]
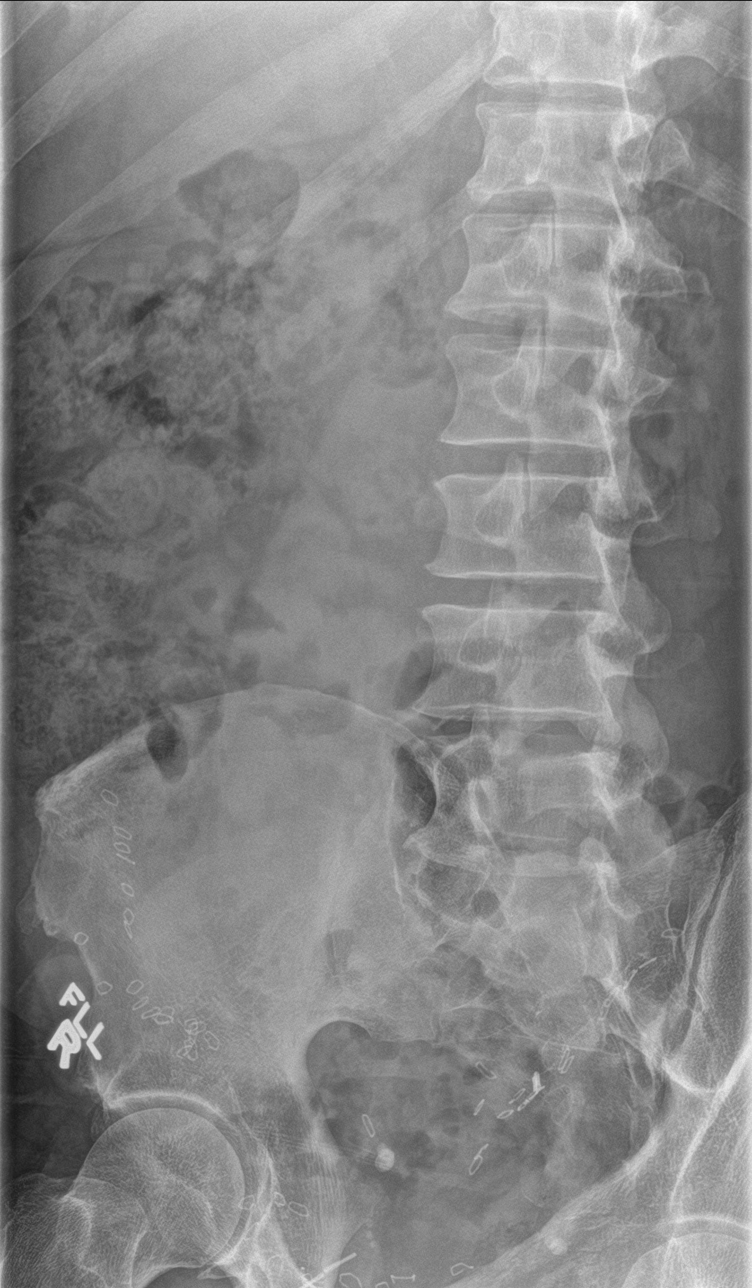

[l-spine obl (2 of 2)]
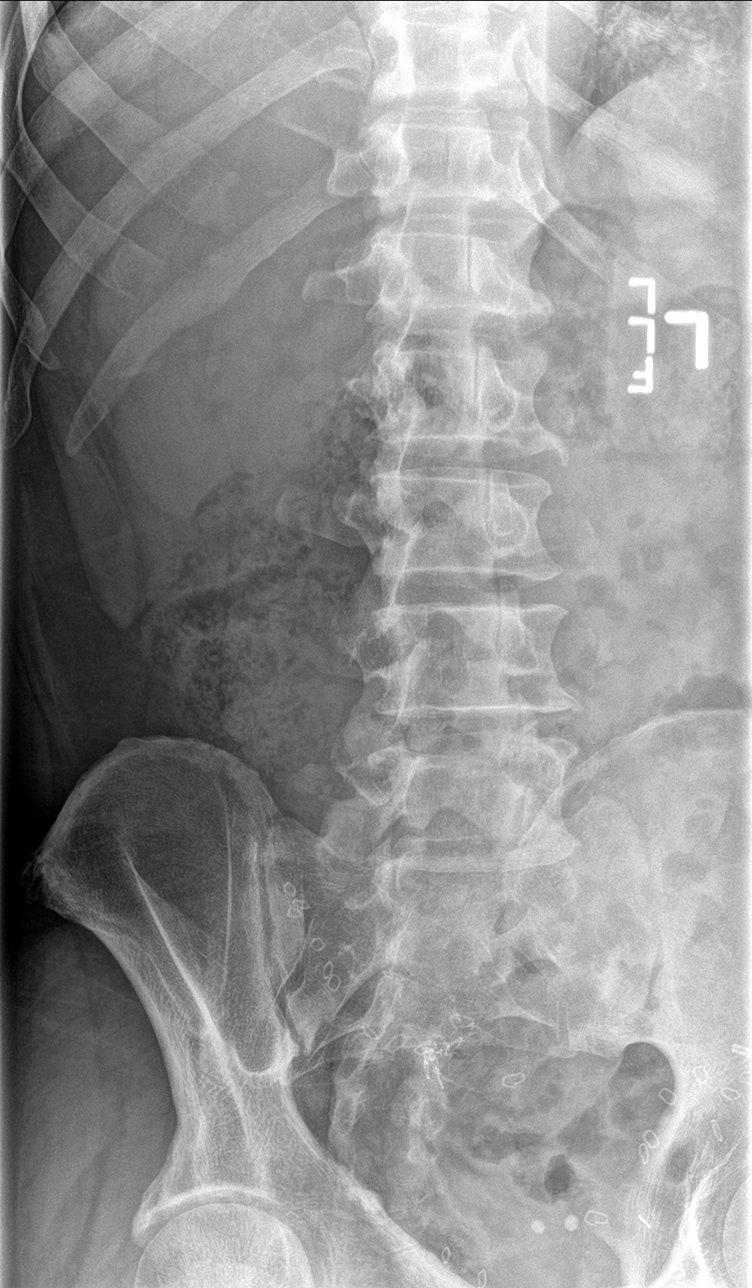

[l-spine lat]
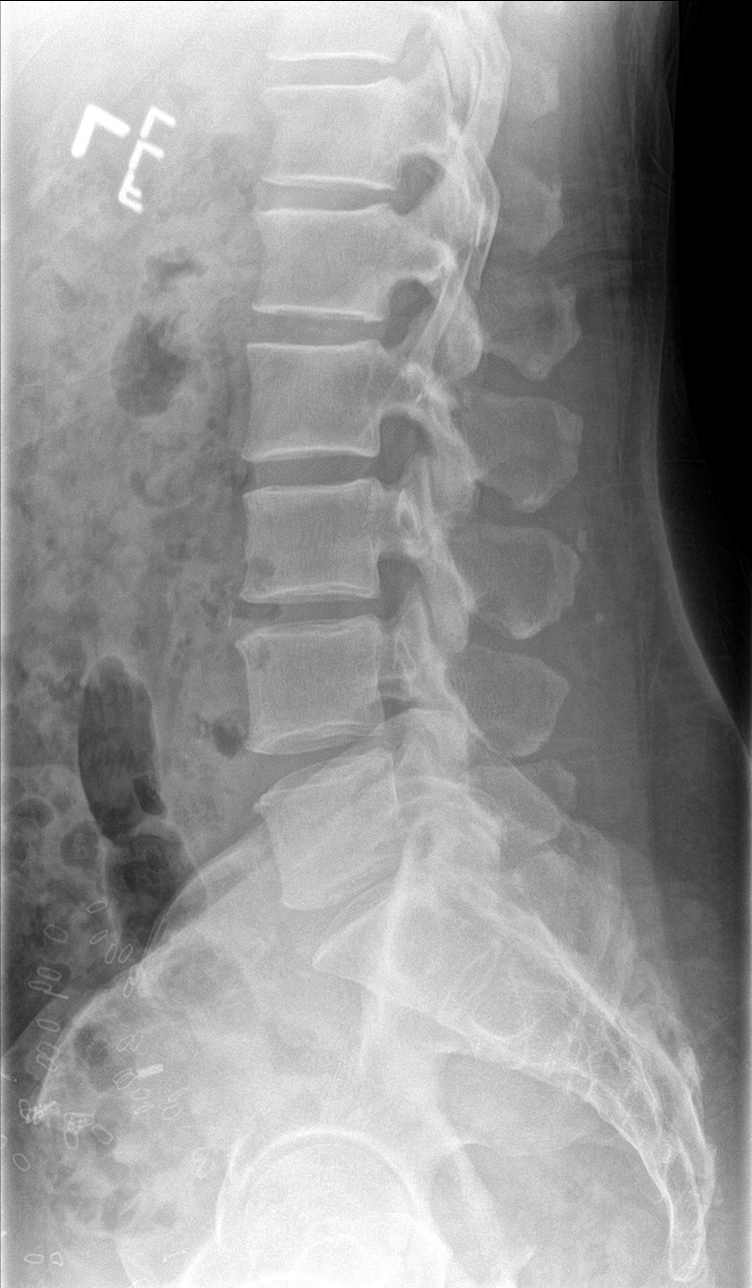

[l-spine spot]
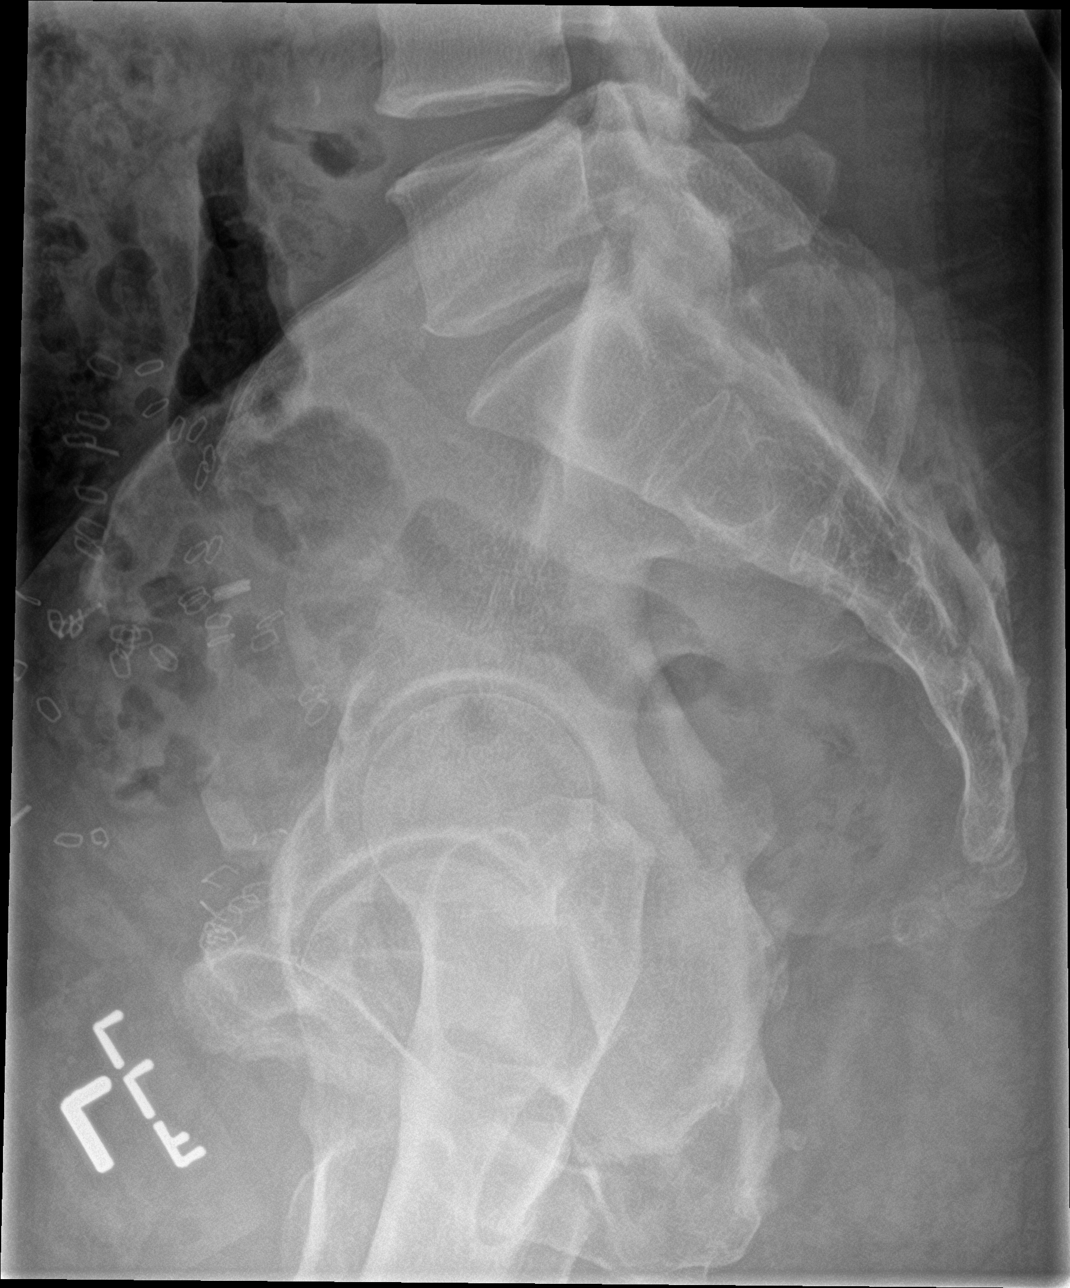

[5 of 5 positions shown; findings below may reference images not displayed]

FINDINGS: 5 non-rib-bearing lumbar vertebra.

Bones appear demineralized.

Vertebral body and disc space heights maintained.

No acute fracture, subluxation or bone destruction.

No spondylolysis.

SI joints symmetric.

Clips in pelvis bilaterally.
IMPRESSION: No acute abnormalities.

## 2016-06-23 NOTE — Telephone Encounter (Signed)
agree

## 2016-07-07 ENCOUNTER — Ambulatory Visit (HOSPITAL_COMMUNITY)
Admission: RE | Admit: 2016-07-07 | Discharge: 2016-07-07 | Disposition: A | Discharge status: Home / Self Care | Payer: BLUE CROSS/BLUE SHIELD | Source: Ambulatory Visit | Attending: Internal Medicine | Admitting: Internal Medicine

## 2016-07-07 ENCOUNTER — Encounter (HOSPITAL_COMMUNITY)
Admission: RE | Disposition: A | Discharge status: Home / Self Care | Payer: Self-pay | Source: Ambulatory Visit | Attending: Internal Medicine

## 2016-07-07 ENCOUNTER — Encounter (HOSPITAL_COMMUNITY): Payer: Self-pay | Admitting: *Deleted

## 2016-07-07 DIAGNOSIS — E119 Type 2 diabetes mellitus without complications: Secondary | ICD-10-CM | POA: Insufficient documentation

## 2016-07-07 DIAGNOSIS — K219 Gastro-esophageal reflux disease without esophagitis: Secondary | ICD-10-CM | POA: Diagnosis not present

## 2016-07-07 DIAGNOSIS — Z7984 Long term (current) use of oral hypoglycemic drugs: Secondary | ICD-10-CM | POA: Diagnosis not present

## 2016-07-07 DIAGNOSIS — F329 Major depressive disorder, single episode, unspecified: Secondary | ICD-10-CM | POA: Insufficient documentation

## 2016-07-07 DIAGNOSIS — D127 Benign neoplasm of rectosigmoid junction: Secondary | ICD-10-CM | POA: Diagnosis not present

## 2016-07-07 DIAGNOSIS — Z79899 Other long term (current) drug therapy: Secondary | ICD-10-CM | POA: Diagnosis not present

## 2016-07-07 DIAGNOSIS — Z1211 Encounter for screening for malignant neoplasm of colon: Secondary | ICD-10-CM

## 2016-07-07 DIAGNOSIS — I10 Essential (primary) hypertension: Secondary | ICD-10-CM | POA: Diagnosis not present

## 2016-07-07 DIAGNOSIS — K635 Polyp of colon: Secondary | ICD-10-CM | POA: Diagnosis not present

## 2016-07-07 DIAGNOSIS — Z87891 Personal history of nicotine dependence: Secondary | ICD-10-CM | POA: Insufficient documentation

## 2016-07-07 DIAGNOSIS — K644 Residual hemorrhoidal skin tags: Secondary | ICD-10-CM | POA: Insufficient documentation

## 2016-07-07 HISTORY — PX: COLONOSCOPY: SHX5424

## 2016-07-07 LAB — GLUCOSE, CAPILLARY: Glucose-Capillary: 143 mg/dL — ABNORMAL HIGH (ref 65–99)

## 2016-07-07 SURGERY — COLONOSCOPY
Anesthesia: Moderate Sedation

## 2016-07-07 MED ORDER — MEPERIDINE HCL 50 MG/ML IJ SOLN
INTRAMUSCULAR | Status: DC
Start: 2016-07-07 — End: 2016-07-07
  Filled 2016-07-07: qty 1

## 2016-07-07 MED ORDER — STERILE WATER FOR IRRIGATION IR SOLN
Status: DC | PRN
  Administered 2016-07-07: 2.5 mL

## 2016-07-07 MED ORDER — MEPERIDINE HCL 50 MG/ML IJ SOLN
INTRAMUSCULAR | Status: DC | PRN
  Administered 2016-07-07: 25 mg
  Administered 2016-07-07: 25 mg via INTRAVENOUS

## 2016-07-07 MED ORDER — MIDAZOLAM HCL 5 MG/5ML IJ SOLN
INTRAMUSCULAR | Status: AC
  Filled 2016-07-07: qty 10

## 2016-07-07 MED ORDER — MIDAZOLAM HCL 5 MG/5ML IJ SOLN
INTRAMUSCULAR | Status: DC | PRN
  Administered 2016-07-07: 2 mg via INTRAVENOUS
  Administered 2016-07-07: 3 mg via INTRAVENOUS
  Administered 2016-07-07: 2 mg via INTRAVENOUS

## 2016-07-07 MED ORDER — SODIUM CHLORIDE 0.9 % IV SOLN
INTRAVENOUS | Status: DC
  Administered 2016-07-07: 09:00:00 via INTRAVENOUS

## 2016-07-07 NOTE — H&P (Signed)
Norman Peters is an 60 y.o. male.   Chief Complaint: Patient is here for colonoscopy. HPI: Patient is 60 year old African-American male who is in for screening colonoscopy. He denies abdominal pain change in bowel habits or rectal bleeding. This is patient's first exam. Family history is negative for CRC.  Past Medical History:  Diagnosis Date  . Depression 2001  . Diabetes mellitus 2010  . GERD (gastroesophageal reflux disease)   . Hypertension 2005    Past Surgical History:  Procedure Laterality Date  . FINGER SURGERY Left 1974   fifth , trauma on job, had to replace/repair finger  . HERNIA REPAIR  [id number]   x3, umbilical and bilateral hernia    Family History  Problem Relation Age of Onset  . Stroke Mother    Social History:  reports that he quit smoking about 5 years ago. He has never used smokeless tobacco. He reports that he does not drink alcohol or use drugs.  Allergies:  Allergies  Allergen Reactions  . Ibuprofen   . Lisinopril Cough  . Strawberry [Berry]     Medications Prior to Admission  Medication Sig Dispense Refill  . amLODipine (NORVASC) 10 MG tablet Take 1 tablet (10 mg total) by mouth daily. 30 tablet 5  . busPIRone (BUSPAR) 5 MG tablet TAKE ONE TABLET BY MOUTH TWICE DAILY 60 tablet 3  . Cholecalciferol (VITAMIN D3) 400 units CAPS Take 2 capsules by mouth daily.    Marland Kitchen FLUoxetine (PROZAC) 20 MG tablet TAKE ONE TABLET BY MOUTH ONCE DAILY 30 tablet 3  . gabapentin (NEURONTIN) 300 MG capsule Take 1 capsule (300 mg total) by mouth at bedtime. 30 capsule 5  . glipiZIDE (GLUCOTROL) 10 MG tablet Take 1 tablet (10 mg total) by mouth 2 (two) times daily before a meal. 60 tablet 3  . metFORMIN (GLUCOPHAGE) 1000 MG tablet Take 1,000 mg by mouth 2 (two) times daily with a meal.    . polyethylene glycol-electrolytes (TRILYTE) 420 g solution Take 4,000 mLs by mouth once. 4000 mL 0  . ranitidine (ZANTAC) 300 MG tablet Take 300 mg by mouth at bedtime.    Marland Kitchen spironolactone  (ALDACTONE) 50 MG tablet Take 1 tablet (50 mg total) by mouth daily. 30 tablet 5    Results for orders placed or performed during the hospital encounter of 07/07/16 (from the past 48 hour(s))  Glucose, capillary     Status: Abnormal   Collection Time: 07/07/16  9:26 AM  Result Value Ref Range   Glucose-Capillary 143 (H) 65 - 99 mg/dL   No results found.  ROS  Blood pressure 139/86, pulse 79, temperature 98.4 F (36.9 C), temperature source Oral, resp. rate 14, height 5\' 8"  (1.727 m), weight 200 lb (90.7 kg), SpO2 96 %. Physical Exam  Constitutional: He appears well-developed and well-nourished.  HENT:  Mouth/Throat: Oropharynx is clear and moist.  Eyes: Conjunctivae are normal. No scleral icterus.  Neck: No thyromegaly present.  Cardiovascular: Normal rate, regular rhythm and normal heart sounds.   No murmur heard. Respiratory: Effort normal and breath sounds normal.  GI: Soft. He exhibits no distension and no mass. There is no tenderness.  Musculoskeletal: He exhibits no edema.  Lymphadenopathy:    He has no cervical adenopathy.  Neurological: He is alert.  Skin: Skin is warm and dry.     Assessment/Plan Average risk screening colonoscopy.  Hildred Laser, MD 07/07/2016, 10:23 AM

## 2016-07-07 NOTE — Discharge Instructions (Signed)
Resume aspirin on 07/08/2016 at usual dose. Resume usual medications and diet. Resume usual diet. No driving for 24 hours. Physician will call with biopsy results.     Colonoscopy, Care After These instructions give you information on caring for yourself after your procedure. Your doctor may also give you more specific instructions. Call your doctor if you have any problems or questions after your procedure. HOME CARE  Do not drive for 24 hours.  Do not sign important papers or use machinery for 24 hours.  You may shower.  You may go back to your usual activities, but go slower for the first 24 hours.  Take rest breaks often during the first 24 hours.  Walk around or use warm packs on your belly (abdomen) if you have belly cramping or gas.  Drink enough fluids to keep your pee (urine) clear or pale yellow.  Resume your normal diet. Avoid heavy or fried foods.  Avoid drinking alcohol for 24 hours or as told by your doctor.  Only take medicines as told by your doctor. If a tissue sample (biopsy) was taken during the procedure:   Do not take aspirin or blood thinners for 7 days, or as told by your doctor.  Do not drink alcohol for 7 days, or as told by your doctor.  Eat soft foods for the first 24 hours. GET HELP IF: You still have a small amount of blood in your poop (stool) 2-3 days after the procedure. GET HELP RIGHT AWAY IF:  You have more than a small amount of blood in your poop.  You see clumps of tissue (blood clots) in your poop.  Your belly is puffy (swollen).  You feel sick to your stomach (nauseous) or throw up (vomit).  You have a fever.  You have belly pain that gets worse and medicine does not help. MAKE SURE YOU:  Understand these instructions.  Will watch your condition.  Will get help right away if you are not doing well or get worse.   This information is not intended to replace advice given to you by your health care provider. Make sure  you discuss any questions you have with your health care provider.   Document Released: 11/27/2010 Document Revised: 10/30/2013 Document Reviewed: 07/02/2013 Elsevier Interactive Patient Education 2016 Elsevier Inc.   Colon Polyps Polyps are lumps of extra tissue growing inside the body. Polyps can grow in the large intestine (colon). Most colon polyps are noncancerous (benign). However, some colon polyps can become cancerous over time. Polyps that are larger than a pea may be harmful. To be safe, caregivers remove and test all polyps. CAUSES  Polyps form when mutations in the genes cause your cells to grow and divide even though no more tissue is needed. RISK FACTORS There are a number of risk factors that can increase your chances of getting colon polyps. They include:  Being older than 50 years.  Family history of colon polyps or colon cancer.  Long-term colon diseases, such as colitis or Crohn disease.  Being overweight.  Smoking.  Being inactive.  Drinking too much alcohol. SYMPTOMS  Most small polyps do not cause symptoms. If symptoms are present, they may include:  Blood in the stool. The stool may look dark red or black.  Constipation or diarrhea that lasts longer than 1 week. DIAGNOSIS People often do not know they have polyps until their caregiver finds them during a regular checkup. Your caregiver can use 4 tests to check for polyps:  Digital rectal exam. The caregiver wears gloves and feels inside the rectum. This test would find polyps only in the rectum.  Barium enema. The caregiver puts a liquid called barium into your rectum before taking X-rays of your colon. Barium makes your colon look white. Polyps are dark, so they are easy to see in the X-ray pictures.  Sigmoidoscopy. A thin, flexible tube (sigmoidoscope) is placed into your rectum. The sigmoidoscope has a light and tiny camera in it. The caregiver uses the sigmoidoscope to look at the last third of your  colon.  Colonoscopy. This test is like sigmoidoscopy, but the caregiver looks at the entire colon. This is the most common method for finding and removing polyps. TREATMENT  Any polyps will be removed during a sigmoidoscopy or colonoscopy. The polyps are then tested for cancer. PREVENTION  To help lower your risk of getting more colon polyps:  Eat plenty of fruits and vegetables. Avoid eating fatty foods.  Do not smoke.  Avoid drinking alcohol.  Exercise every day.  Lose weight if recommended by your caregiver.  Eat plenty of calcium and folate. Foods that are rich in calcium include milk, cheese, and broccoli. Foods that are rich in folate include chickpeas, kidney beans, and spinach. HOME CARE INSTRUCTIONS Keep all follow-up appointments as directed by your caregiver. You may need periodic exams to check for polyps. SEEK MEDICAL CARE IF: You notice bleeding during a bowel movement.   This information is not intended to replace advice given to you by your health care provider. Make sure you discuss any questions you have with your health care provider.   Document Released: 07/21/2004 Document Revised: 11/15/2014 Document Reviewed: 01/04/2012 Elsevier Interactive Patient Education [employer].

## 2016-07-07 NOTE — Op Note (Signed)
[facility] Patient Name: Norman Peters Procedure Date: 07/07/2016 10:22 AM MRN: [id number] Date of Birth: [birth year] Attending MD: Hildred Laser , MD CSN: [id number] Age: 60 Admit Type: Outpatient Procedure:                Colonoscopy Indications:              Screening for colorectal malignant neoplasm Providers:                Hildred Laser, MD, Lurline Del, RN, Isabella Stalling,                            Technician Referring MD:             Norwood Levo. Simpson MD, MD Medicines:                Meperidine 50 mg IV, Midazolam 7 mg IV Complications:            No immediate complications. Estimated Blood Loss:     Estimated blood loss was minimal. Procedure:                Pre-Anesthesia Assessment:                           - Prior to the procedure, a History and Physical                            was performed, and patient medications and                            allergies were reviewed. The patient's tolerance of                            previous anesthesia was also reviewed. The risks                            and benefits of the procedure and the sedation                            options and risks were discussed with the patient.                            All questions were answered, and informed consent                            was obtained. Prior Anticoagulants: The patient                            last took aspirin 1 day prior to the procedure. ASA                            Grade Assessment: III - A patient with severe                            systemic disease. After reviewing the risks and  benefits, the patient was deemed in satisfactory                            condition to undergo the procedure.                           After obtaining informed consent, the colonoscope                            was passed under direct vision. Throughout the                            procedure, the patient's blood pressure, pulse, and                      oxygen saturations were monitored continuously. The                            [id number]) scope was introduced through                            the anus and advanced to the the cecum, identified                            by appendiceal orifice and ileocecal valve. The                            colonoscopy was performed without difficulty. The                            patient tolerated the procedure well. The quality                            of the bowel preparation was good. The ileocecal                            valve, appendiceal orifice, and rectum were                            photographed. Scope In: 10:34:22 AM Scope Out: 10:55:39 AM Scope Withdrawal Time: 0 hours 16 minutes 55 seconds  Total Procedure Duration: 0 hours 21 minutes 17 seconds  Findings:      A 5 mm polyp was found in the recto-sigmoid colon. The polyp was       sessile. The polyp was removed with a cold snare. Resection and       retrieval were complete.      External hemorrhoids were found during retroflexion. The hemorrhoids       were small. Impression:               - One 5 mm polyp at the recto-sigmoid colon,                            removed with a cold snare. Resected and retrieved.                           -  External hemorrhoids. Moderate Sedation:      Moderate (conscious) sedation was administered by the endoscopy nurse       and supervised by the endoscopist. The following parameters were       monitored: oxygen saturation, heart rate, blood pressure, CO2       capnography and response to care. Total physician intraservice time was       27 minutes. Recommendation:           - Patient has a contact number available for                            emergencies. The signs and symptoms of potential                            delayed complications were discussed with the                            patient. Return to normal activities tomorrow.                             Written discharge instructions were provided to the                            patient.                           - Resume previous diet today.                           - Continue present medications.                           - Resume aspirin at prior dose tomorrow.                           - Await pathology results.                           - Repeat colonoscopy for surveillance based on                            pathology results. Procedure Code(s):        --- Professional ---                           [id number], Colonoscopy, flexible; with removal of                            tumor(s), polyp(s), or other lesion(s) by snare                            technique                           99152, Moderate sedation services provided by the  same physician or other qualified health care                            professional performing the diagnostic or                            therapeutic service that the sedation supports,                            requiring the presence of an independent trained                            observer to assist in the monitoring of the                            patient's level of consciousness and physiological                            status; initial 15 minutes of intraservice time,                            patient age 52 years or older                           [phone], Moderate sedation services; each additional                            15 minutes intraservice time Diagnosis Code(s):        --- Professional ---                           Z12.11, Encounter for screening for malignant                            neoplasm of colon                           D12.7, Benign neoplasm of rectosigmoid junction                           K64.4, Residual hemorrhoidal skin tags CPT copyright 2016 American Medical Association. All rights reserved. The codes documented in this report are preliminary and upon coder review may  be  revised to meet current compliance requirements. Hildred Laser, MD Hildred Laser, MD 07/07/2016 11:02:26 AM This report has been signed electronically. Number of Addenda: 0

## 2016-07-13 ENCOUNTER — Encounter (HOSPITAL_COMMUNITY): Payer: Self-pay | Admitting: Internal Medicine

## 2016-07-14 ENCOUNTER — Ambulatory Visit (INDEPENDENT_AMBULATORY_CARE_PROVIDER_SITE_OTHER): Payer: BLUE CROSS/BLUE SHIELD | Admitting: Family Medicine

## 2016-07-14 ENCOUNTER — Encounter: Payer: Self-pay | Admitting: Family Medicine

## 2016-07-14 VITALS — BP 130/80 | HR 89 | Resp 16 | Ht 68.0 in | Wt 205.0 lb

## 2016-07-14 DIAGNOSIS — Z0000 Encounter for general adult medical examination without abnormal findings: Secondary | ICD-10-CM

## 2016-07-14 DIAGNOSIS — Z23 Encounter for immunization: Secondary | ICD-10-CM

## 2016-07-14 NOTE — Progress Notes (Signed)
   Norman Peters     MRN: [id number]      DOB: 08/28/1956   HPI: Patient is in for annual physical exam. No other health concerns are expressed or addressed at the visit. Recent labs, if available are reviewed. Immunization is reviewed , and  updated if needed.    PE; Pleasant male, alert and oriented x 3, in no cardio-pulmonary distress. Afebrile. HEENT No facial trauma or asymetry. Sinuses non tender. EOMI, pupils equally reactive to light. External ears normal, tympanic membranes clear. Oropharynx moist, no exudate. Neck: supple, no adenopathy,JVD or thyromegaly.No bruits.  Chest: Clear to ascultation bilaterally.No crackles or wheezes. Non tender to palpation  Breast: No asymetry,no masses. No nipple discharge or inversion. No axillary or supraclavicular adenopathy  Cardiovascular system; Heart sounds normal,  S1 and  S2 ,no S3.  No murmur, or thrill. Apical beat not displaced Peripheral pulses normal.  Abdomen: Soft, non tender, no organomegaly or masses. No bruits. Bowel sounds normal. No guarding, tenderness or rebound.  Rectal:  Normal sphincter tone. . Prostate smooth and firm    Musculoskeletal exam: Full ROM of spine, hips , knees, decreased in shoulders No deformity ,swelling or crepitus noted. No muscle wasting or atrophy.   Neurologic: Cranial nerves 2 to 12 intact. Power, tone ,sensation and reflexes normal throughout. No disturbance in gait. No tremor.  Skin: Intact, no ulceration, erythema , scaling or rash noted. Pigmentation normal throughout  Psych; Normal mood and affect. Judgement and concentration normal   Assessment & Plan:  Annual physical exam Annual exam as documented. Counseling done  re healthy lifestyle involving commitment to 150 minutes exercise per week, heart healthy diet, and attaining healthy weight.The importance of adequate sleep also discussed. Regular seat belt use and home safety, is also discussed. Changes  in health habits are decided on by the patient with goals and time frames  set for achieving them. Immunization and cancer screening needs are specifically addressed at this visit.   Need for prophylactic vaccination and inoculation against influenza After obtaining informed consent, the vaccine is  administered by LPN.

## 2016-07-14 NOTE — Assessment & Plan Note (Signed)
After obtaining informed consent, the vaccine is  administered by LPN.  

## 2016-07-14 NOTE — Patient Instructions (Signed)
F/u in 4.5 month, call if you need me sooner  Flu vaccine today  HBA1C, chem 7 and eGFR today  It is important that you exercise regularly at least 30 minutes 5 times a week. If you develop chest pain, have severe difficulty breathing, or feel very tired, stop exercising immediately and seek medical attention    ALL the best with Math for your GED, you WILL get through!  Thank you  for choosing [facility]. We consider it a privelige to serve you.  Delivering excellent health care in a caring and  compassionate way is our goal.  Partnering with you,  so that together we can achieve this goal is our strategy.

## 2016-07-14 NOTE — Assessment & Plan Note (Signed)

## 2016-07-30 ENCOUNTER — Other Ambulatory Visit: Payer: Self-pay

## 2016-07-30 MED ORDER — METFORMIN HCL 1000 MG PO TABS: 1000.0000 mg | ORAL_TABLET | Freq: Two times a day (BID) | ORAL | 4 refills | Status: DC

## 2016-08-08 ENCOUNTER — Other Ambulatory Visit: Payer: Self-pay | Admitting: Family Medicine

## 2016-08-08 DIAGNOSIS — I10 Essential (primary) hypertension: Secondary | ICD-10-CM

## 2016-08-24 ENCOUNTER — Other Ambulatory Visit: Payer: Self-pay

## 2016-08-24 DIAGNOSIS — E669 Obesity, unspecified: Principal | ICD-10-CM

## 2016-08-24 DIAGNOSIS — E1169 Type 2 diabetes mellitus with other specified complication: Secondary | ICD-10-CM

## 2016-08-24 MED ORDER — GLUCOSE BLOOD VI STRP
ORAL_STRIP | 0 refills | Status: DC
Start: 2016-08-24 — End: 2016-08-24

## 2016-08-24 MED ORDER — GLUCOSE BLOOD VI STRP: ORAL_STRIP | 0 refills | Status: DC

## 2016-08-25 ENCOUNTER — Other Ambulatory Visit: Payer: Self-pay

## 2016-08-25 MED ORDER — GLUCOSE BLOOD VI STRP: ORAL_STRIP | 0 refills | Status: DC

## 2016-08-31 ENCOUNTER — Other Ambulatory Visit: Payer: Self-pay

## 2016-08-31 MED ORDER — FLUOXETINE HCL 20 MG PO CAPS: 20.0000 mg | ORAL_CAPSULE | Freq: Every day | ORAL | 5 refills | Status: DC

## 2016-09-06 ENCOUNTER — Other Ambulatory Visit: Payer: Self-pay | Admitting: Family Medicine

## 2016-09-06 DIAGNOSIS — I10 Essential (primary) hypertension: Secondary | ICD-10-CM

## 2016-09-15 ENCOUNTER — Other Ambulatory Visit: Payer: Self-pay

## 2016-09-15 DIAGNOSIS — E1169 Type 2 diabetes mellitus with other specified complication: Secondary | ICD-10-CM

## 2016-09-15 DIAGNOSIS — F419 Anxiety disorder, unspecified: Secondary | ICD-10-CM

## 2016-09-15 DIAGNOSIS — M5440 Lumbago with sciatica, unspecified side: Secondary | ICD-10-CM

## 2016-09-15 DIAGNOSIS — F329 Major depressive disorder, single episode, unspecified: Secondary | ICD-10-CM

## 2016-09-15 DIAGNOSIS — I10 Essential (primary) hypertension: Secondary | ICD-10-CM

## 2016-09-15 DIAGNOSIS — E669 Obesity, unspecified: Secondary | ICD-10-CM

## 2016-09-15 DIAGNOSIS — F32A Depression, unspecified: Secondary | ICD-10-CM

## 2016-09-15 MED ORDER — METFORMIN HCL 1000 MG PO TABS: 1000.0000 mg | ORAL_TABLET | Freq: Two times a day (BID) | ORAL | 1 refills | Status: DC

## 2016-09-15 MED ORDER — BUSPIRONE HCL 5 MG PO TABS: 5.0000 mg | ORAL_TABLET | Freq: Two times a day (BID) | ORAL | 1 refills | Status: DC

## 2016-09-15 MED ORDER — AMLODIPINE BESYLATE 10 MG PO TABS: 10.0000 mg | ORAL_TABLET | Freq: Every day | ORAL | 1 refills | Status: DC

## 2016-09-15 MED ORDER — SPIRONOLACTONE 50 MG PO TABS: 50.0000 mg | ORAL_TABLET | Freq: Every day | ORAL | 1 refills | Status: DC

## 2016-09-15 MED ORDER — GLIPIZIDE 10 MG PO TABS: 10.0000 mg | ORAL_TABLET | Freq: Two times a day (BID) | ORAL | 1 refills | Status: DC

## 2016-09-15 MED ORDER — FLUOXETINE HCL 20 MG PO CAPS: 20.0000 mg | ORAL_CAPSULE | Freq: Every day | ORAL | 1 refills | Status: DC

## 2016-09-15 MED ORDER — GABAPENTIN 300 MG PO CAPS: 300.0000 mg | ORAL_CAPSULE | Freq: Every day | ORAL | 1 refills | Status: DC

## 2016-10-06 ENCOUNTER — Encounter: Payer: Self-pay | Admitting: Family Medicine

## 2016-10-06 ENCOUNTER — Ambulatory Visit (INDEPENDENT_AMBULATORY_CARE_PROVIDER_SITE_OTHER): Payer: Commercial Managed Care - HMO | Admitting: Family Medicine

## 2016-10-06 VITALS — BP 140/80 | HR 82 | Resp 16 | Ht 68.0 in | Wt 215.0 lb

## 2016-10-06 DIAGNOSIS — E1169 Type 2 diabetes mellitus with other specified complication: Secondary | ICD-10-CM

## 2016-10-06 DIAGNOSIS — E6609 Other obesity due to excess calories: Secondary | ICD-10-CM | POA: Diagnosis not present

## 2016-10-06 DIAGNOSIS — I10 Essential (primary) hypertension: Secondary | ICD-10-CM

## 2016-10-06 DIAGNOSIS — Z6832 Body mass index (BMI) 32.0-32.9, adult: Secondary | ICD-10-CM

## 2016-10-06 DIAGNOSIS — F329 Major depressive disorder, single episode, unspecified: Secondary | ICD-10-CM

## 2016-10-06 DIAGNOSIS — E669 Obesity, unspecified: Secondary | ICD-10-CM | POA: Diagnosis not present

## 2016-10-06 DIAGNOSIS — E559 Vitamin D deficiency, unspecified: Secondary | ICD-10-CM | POA: Diagnosis not present

## 2016-10-06 DIAGNOSIS — F418 Other specified anxiety disorders: Secondary | ICD-10-CM

## 2016-10-06 DIAGNOSIS — F419 Anxiety disorder, unspecified: Secondary | ICD-10-CM

## 2016-10-06 DIAGNOSIS — M62830 Muscle spasm of back: Secondary | ICD-10-CM | POA: Insufficient documentation

## 2016-10-06 LAB — LIPID PANEL
CHOL/HDL RATIO: 3.3 ratio (ref <5.0)
CHOLESTEROL: 120 mg/dL (ref <200)
HDL: 36 mg/dL — AB (ref >40)
LDL Cholesterol: 48 mg/dL (ref <100)
TRIGLYCERIDES: 182 mg/dL — AB (ref <150)
VLDL: 36 mg/dL — ABNORMAL HIGH (ref <30)

## 2016-10-06 LAB — COMPLETE METABOLIC PANEL WITH GFR
ALBUMIN: 4.2 g/dL (ref 3.6–5.1)
ALT: 12 U/L (ref 9–46)
AST: 11 U/L (ref 10–35)
Alkaline Phosphatase: 66 U/L (ref 40–115)
BILIRUBIN TOTAL: 0.5 mg/dL (ref 0.2–1.2)
BUN: 17 mg/dL (ref 7–25)
CALCIUM: 9.5 mg/dL (ref 8.6–10.3)
CO2: 26 mmol/L (ref 20–31)
CREATININE: 1.27 mg/dL — AB (ref 0.70–1.25)
Chloride: 100 mmol/L (ref 98–110)
GFR, EST AFRICAN AMERICAN: 71 mL/min (ref >=60)
GFR, Est Non African American: 61 mL/min (ref >=60)
Glucose, Bld: 140 mg/dL — ABNORMAL HIGH (ref 65–99)
Potassium: 5 mmol/L (ref 3.5–5.3)
Sodium: 136 mmol/L (ref 135–146)
TOTAL PROTEIN: 7 g/dL (ref 6.1–8.1)

## 2016-10-06 LAB — HEMOGLOBIN A1C
Hgb A1c MFr Bld: 7.3 % — ABNORMAL HIGH (ref <5.7)
Mean Plasma Glucose: 163 mg/dL

## 2016-10-06 MED ORDER — CYCLOBENZAPRINE HCL 10 MG PO TABS: 10.0000 mg | ORAL_TABLET | Freq: Three times a day (TID) | ORAL | 0 refills | Status: DC | PRN

## 2016-10-06 NOTE — Assessment & Plan Note (Signed)
Acute onset following near fall and injury, pain mild to moderate. Imaging study not indicated. Short term use of muscle relaxant and tylenol, call if worsening or not improving

## 2016-10-06 NOTE — Assessment & Plan Note (Signed)
Controlled, no change in medication  

## 2016-10-06 NOTE — Patient Instructions (Addendum)
Welcome to medicare in 3.5 month, call if you need me sooner  Labs today, Vit d, lipid, hBA1c, cmp and eGFR  Please directly contact [facility] staff member for help   Muscle relaxant sent for pulled muscle in back, and use tylenol 500 mg one twice daily  Please work on good  health habits so that your health will improve. 1. Commitment to daily physical activity for 30 to 60  minutes, if you are able to do this.  2. Commitment to wise food choices. Aim for half of your  food intake to be vegetable and fruit, one quarter starchy foods, and one quarter protein. Try to eat on a regular schedule  3 meals per day, snacking between meals should be limited to vegetables or fruits or small portions of nuts. 64 ounces of water per day is generally recommended, unless you have specific health conditions, like heart failure or kidney failure where you will need to limit fluid intake.  3. Commitment to sufficient and a  good quality of physical and mental rest daily, generally between 6 to 8 hours per day.  WITH PERSISTANCE AND PERSEVERANCE, THE IMPOSSIBLE , BECOMES THE NORM!  Thank you  for choosing [facility]. We consider it a privelige to serve you.  Delivering excellent health care in a caring and  compassionate way is our goal.  Partnering with you,  so that together we can achieve this goal is our strategy.

## 2016-10-06 NOTE — Assessment & Plan Note (Signed)
UnControlled, not at goal no change in medication, weight loss needed DASH diet and commitment to daily physical activity for a minimum of 30 minutes discussed and encouraged, as a part of hypertension management. The importance of attaining a healthy weight is also discussed.  BP/Weight 10/06/2016 07/14/2016 07/07/2016 04/21/2016 03/18/2016 02/10/2016 [id number]  Systolic BP [id number] A999333 A999333 Q000111Q [id number] A999333  Diastolic BP 80 80 88 84 94 100 76  Wt. (Lbs) 215 205 200 - 211.4 212 208  BMI 39.32 31.17 30.41 - 30.33 30.63 31.63

## 2016-10-06 NOTE — Progress Notes (Signed)
Norman Peters     MRN: [id number]      DOB: 17-Jan-1956   HPI Norman Peters is here fell down about 4 steps at home, he slid , fell on his buttock, and grabbed  The banister rail with right upper extremity and since then pain has remained and seems to be worsening No upper ext weakness and numbness, rated at a 6 , disturbs his sleep C/o episodes of low blood sugar when he takes glipizide, has more often been taking half the dose and reports fasting sugar around 160, will wait on hBA1C to make med chnage  ROS Denies recent fever or chills. Denies sinus pressure, nasal congestion, ear pain or sore throat. Denies chest congestion, productive cough or wheezing. Denies chest pains, palpitations and leg swelling Denies abdominal pain, nausea, vomiting,diarrhea or constipation.   Denies dysuria, frequency, hesitancy or incontinence. . Denies skin break down or rash.   PE  BP 140/80   Pulse 82   Resp 16   Ht 5\' 8"  (1.727 m)   Wt 215 lb (97.5 kg)   SpO2 96%   BMI 32.69 kg/m     Patient alert and oriented and in no cardiopulmonary distress.Patinet in mild pain  HEENT: No facial asymmetry, EOMI,   oropharynx pink and moist.  Neck supple no JVD, no mass.  Chest: Clear to auscultation bilaterally.  CVS: S1, S2 no murmurs, no S3.Regular rate.  ABD: Soft non tender.   Ext: No edema  MS: Adequate ROM spine, shoulders, hips and knees.Tender over mid and upper back with muscle spasm Skin: Intact, no ulcerations or rash noted.  Psych: Good eye contact, normal affect. Memory intact not anxious or depressed appearing.  CNS: CN 2-12 intact, power,  normal throughout.no focal deficits noted.   Assessment & Plan  Spasm of thoracic back muscle Acute onset following near fall and injury, pain mild to moderate. Imaging study not indicated. Short term use of muscle relaxant and tylenol, call if worsening or not improving  Hypertension goal BP (blood pressure) < 130/80 UnControlled, not at  goal no change in medication, weight loss needed DASH diet and commitment to daily physical activity for a minimum of 30 minutes discussed and encouraged, as a part of hypertension management. The importance of attaining a healthy weight is also discussed.  BP/Weight 10/06/2016 07/14/2016 07/07/2016 04/21/2016 03/18/2016 02/10/2016 [id number]  Systolic BP [id number] A999333 A999333 Q000111Q XX123456 A999333  Diastolic BP 80 80 88 84 94 100 76  Wt. (Lbs) 215 205 200 - 211.4 212 208  BMI 39.32 31.17 30.41 - 30.33 30.63 31.63       Diabetes mellitus type 2 in obese [facility]) Norman Peters is reminded of the importance of commitment to daily physical activity for 30 minutes or more, as able and the need to limit carbohydrate intake to 30 to 60 grams per meal to help with blood sugar control.   The need to take medication as prescribed, test blood sugar as directed, and to call between visits if there is a concern that blood sugar is uncontrolled is also discussed.   Norman Peters is reminded of the importance of daily foot exam, annual eye examination, and good blood sugar, blood pressure and cholesterol control. Uncontrolled, updated lab past due, notes he often takes only 10 mg glipizide as 20 mg causes him to "bottom out"  Diabetic Labs Latest Ref Rng & Units 02/10/2016 05/20/2011  HbA1c <5.7 % 8.4(H) -  Microalbumin Not estab mg/dL 111.4 -  Micro/Creat Ratio <30 mcg/mg creat 629(H) -  Chol 125 - 200 mg/dL 110(L) -  HDL >=40 mg/dL 38(L) -  Calc LDL <130 mg/dL 50 -  Triglycerides <150 mg/dL 110 -  Creatinine 0.70 - 1.33 mg/dL 1.09 1.16   BP/Weight 10/06/2016 07/14/2016 07/07/2016 04/21/2016 03/18/2016 02/10/2016 [id number]  Systolic BP [id number] A999333 A999333 Q000111Q XX123456 A999333  Diastolic BP 80 80 88 84 94 100 76  Wt. (Lbs) 215 205 200 - 211.4 212 208  BMI 39.32 31.17 30.41 - 30.33 30.63 31.63   Foot/eye exam completion dates Latest Ref Rng & Units 04/21/2016 02/10/2016  Eye Exam No Retinopathy No Retinopathy -  Foot Form Completion - - Done         Obesity Deteriorated. Patient re-educated about  the importance of commitment to a  minimum of 150 minutes of exercise per week.  The importance of healthy food choices with portion control discussed. Encouraged to start a food diary, count calories and to consider  joining a support group. Sample diet sheets offered. Goals set by the patient for the next several months.   Weight /BMI 10/06/2016 07/14/2016 07/07/2016  WEIGHT 215 lb 205 lb 200 lb  HEIGHT 5\' 8"  5\' 8"  5\' 8"   BMI 32.69 kg/m2 31.17 kg/m2 30.41 kg/m2        Anxiety and depression Controlled, no change in medication

## 2016-10-06 NOTE — Assessment & Plan Note (Addendum)
Deteriorated. Patient re-educated about  the importance of commitment to a  minimum of 150 minutes of exercise per week.  The importance of healthy food choices with portion control discussed. Encouraged to start a food diary, count calories and to consider  joining a support group. Sample diet sheets offered. Goals set by the patient for the next several months.   Weight /BMI 10/06/2016 07/14/2016 07/07/2016  WEIGHT 215 lb 205 lb 200 lb  HEIGHT 5\' 8"  5\' 8"  5\' 8"   BMI 32.69 kg/m2 31.17 kg/m2 30.41 kg/m2

## 2016-10-06 NOTE — Assessment & Plan Note (Signed)
Norman Peters is reminded of the importance of commitment to daily physical activity for 30 minutes or more, as able and the need to limit carbohydrate intake to 30 to 60 grams per meal to help with blood sugar control.   The need to take medication as prescribed, test blood sugar as directed, and to call between visits if there is a concern that blood sugar is uncontrolled is also discussed.   Norman Peters is reminded of the importance of daily foot exam, annual eye examination, and good blood sugar, blood pressure and cholesterol control. Uncontrolled, updated lab past due, notes he often takes only 10 mg glipizide as 20 mg causes him to "bottom out"  Diabetic Labs Latest Ref Rng & Units 02/10/2016 05/20/2011  HbA1c <5.7 % 8.4(H) -  Microalbumin Not estab mg/dL 111.4 -  Micro/Creat Ratio <30 mcg/mg creat 629(H) -  Chol 125 - 200 mg/dL 110(L) -  HDL >=40 mg/dL 38(L) -  Calc LDL <130 mg/dL 50 -  Triglycerides <150 mg/dL 110 -  Creatinine 0.70 - 1.33 mg/dL 1.09 1.16   BP/Weight 10/06/2016 07/14/2016 07/07/2016 04/21/2016 03/18/2016 02/10/2016 [id number]  Systolic BP [id number]  Diastolic BP 80 80 88 84 94 100 76  Wt. (Lbs) 215 205 200 - 211.4 212 208  BMI 39.32 31.17 30.41 - 30.33 30.63 31.63   Foot/eye exam completion dates Latest Ref Rng & Units 04/21/2016 02/10/2016  Eye Exam No Retinopathy No Retinopathy -  Foot Form Completion - - Done

## 2016-10-07 ENCOUNTER — Other Ambulatory Visit: Payer: Self-pay | Admitting: Family Medicine

## 2016-10-07 LAB — VITAMIN D 25 HYDROXY (VIT D DEFICIENCY, FRACTURES): VIT D 25 HYDROXY: 36 ng/mL (ref 30–100)

## 2016-10-08 ENCOUNTER — Telehealth: Payer: Self-pay

## 2016-10-08 ENCOUNTER — Other Ambulatory Visit: Payer: Self-pay

## 2016-10-08 DIAGNOSIS — E1169 Type 2 diabetes mellitus with other specified complication: Secondary | ICD-10-CM

## 2016-10-08 DIAGNOSIS — I10 Essential (primary) hypertension: Secondary | ICD-10-CM

## 2016-10-08 DIAGNOSIS — E669 Obesity, unspecified: Secondary | ICD-10-CM

## 2016-10-08 MED ORDER — GLIPIZIDE ER 10 MG PO TB24: 10.0000 mg | ORAL_TABLET | Freq: Every day | ORAL | 5 refills | Status: DC

## 2016-10-08 MED ORDER — PRAVASTATIN SODIUM 20 MG PO TABS: 20.0000 mg | ORAL_TABLET | Freq: Every day | ORAL | 5 refills | Status: DC

## 2016-10-08 NOTE — Telephone Encounter (Signed)
-----  Message from Fayrene Helper, MD sent at 10/07/2016 11:44 AM EST ----- Needs to reduce glipizide 10 mg to one daily, he has been doing this most of the time so keep ion that dose , we just need to change and let pharmacy know , script is printed, continue metformin as before twice daily Needs statin for heart protection and cholesterol , I am entering that historically also Vit d improved, keep taking supplement Needs rept fasting lipid, cmp and eGFR and hBA1C in approx 4 months Med changes are entered historically , pls print and send , tx

## 2016-10-25 ENCOUNTER — Other Ambulatory Visit: Payer: Self-pay | Admitting: Family Medicine

## 2016-10-27 ENCOUNTER — Other Ambulatory Visit: Payer: Self-pay | Admitting: Family Medicine

## 2016-11-24 ENCOUNTER — Ambulatory Visit: Payer: BLUE CROSS/BLUE SHIELD | Admitting: Family Medicine

## 2017-01-03 ENCOUNTER — Telehealth: Payer: Self-pay | Admitting: Family Medicine

## 2017-01-03 NOTE — Telephone Encounter (Signed)
Norman Peters is asking for a refill on his medications sent to [facility], please advise?

## 2017-01-06 ENCOUNTER — Other Ambulatory Visit: Payer: Self-pay

## 2017-01-06 DIAGNOSIS — I10 Essential (primary) hypertension: Secondary | ICD-10-CM

## 2017-01-06 DIAGNOSIS — M5440 Lumbago with sciatica, unspecified side: Secondary | ICD-10-CM

## 2017-01-06 MED ORDER — FLUOXETINE HCL 20 MG PO CAPS: 20.0000 mg | ORAL_CAPSULE | Freq: Every day | ORAL | 1 refills | Status: DC

## 2017-01-06 MED ORDER — GABAPENTIN 300 MG PO CAPS: 300.0000 mg | ORAL_CAPSULE | Freq: Every day | ORAL | 1 refills | Status: DC

## 2017-01-06 MED ORDER — METFORMIN HCL 1000 MG PO TABS: 1000.0000 mg | ORAL_TABLET | Freq: Two times a day (BID) | ORAL | 1 refills | Status: DC

## 2017-01-06 MED ORDER — AMLODIPINE BESYLATE 10 MG PO TABS: 10.0000 mg | ORAL_TABLET | Freq: Every day | ORAL | 1 refills | Status: DC

## 2017-01-06 MED ORDER — SPIRONOLACTONE 50 MG PO TABS: 50.0000 mg | ORAL_TABLET | Freq: Every day | ORAL | 1 refills | Status: DC

## 2017-01-06 NOTE — Telephone Encounter (Signed)
Refills sent

## 2017-01-13 ENCOUNTER — Telehealth: Payer: Self-pay | Admitting: Family Medicine

## 2017-01-13 ENCOUNTER — Other Ambulatory Visit: Payer: Self-pay

## 2017-01-13 DIAGNOSIS — F419 Anxiety disorder, unspecified: Principal | ICD-10-CM

## 2017-01-13 DIAGNOSIS — F329 Major depressive disorder, single episode, unspecified: Secondary | ICD-10-CM

## 2017-01-13 MED ORDER — METFORMIN HCL 1000 MG PO TABS: 1000.0000 mg | ORAL_TABLET | Freq: Two times a day (BID) | ORAL | 1 refills | Status: DC

## 2017-01-13 MED ORDER — BUSPIRONE HCL 5 MG PO TABS: 5.0000 mg | ORAL_TABLET | Freq: Two times a day (BID) | ORAL | 1 refills | Status: DC

## 2017-01-13 NOTE — Telephone Encounter (Signed)
Yes meds have been sent

## 2017-01-13 NOTE — Telephone Encounter (Signed)
Norman Peters is asking if his Buspar and Metformin got called into the pharmacy yesterday, please advise?

## 2017-01-25 ENCOUNTER — Ambulatory Visit (INDEPENDENT_AMBULATORY_CARE_PROVIDER_SITE_OTHER): Payer: Medicare HMO | Admitting: Family Medicine

## 2017-01-25 ENCOUNTER — Encounter: Payer: Self-pay | Admitting: Family Medicine

## 2017-01-25 VITALS — BP 128/80 | HR 86 | Resp 16 | Ht 68.0 in | Wt 211.0 lb

## 2017-01-25 DIAGNOSIS — Z125 Encounter for screening for malignant neoplasm of prostate: Secondary | ICD-10-CM

## 2017-01-25 DIAGNOSIS — Z87891 Personal history of nicotine dependence: Secondary | ICD-10-CM

## 2017-01-25 DIAGNOSIS — Z0000 Encounter for general adult medical examination without abnormal findings: Secondary | ICD-10-CM | POA: Diagnosis not present

## 2017-01-25 DIAGNOSIS — I10 Essential (primary) hypertension: Secondary | ICD-10-CM

## 2017-01-25 DIAGNOSIS — E1169 Type 2 diabetes mellitus with other specified complication: Secondary | ICD-10-CM

## 2017-01-25 DIAGNOSIS — E669 Obesity, unspecified: Secondary | ICD-10-CM

## 2017-01-25 NOTE — Assessment & Plan Note (Signed)
Welcome to medicare as documented. ? ?

## 2017-01-25 NOTE — Patient Instructions (Signed)
Annual physical exam in 6 months, call if you need me before  Fasting lipid, cmp and eGFR, hBA1Cand pSA April 5 or after  Start aspirin 81 mg one daily  You are referred for [place] of aorta due to your smoking history , we will call with appt info  Thank you  for choosing [facility]. We consider it a privelige to serve you.  Delivering excellent health care in a caring and  compassionate way is our goal.  Partnering with you,  so that together we can achieve this goal is our strategy.   Use tylenol 500 mg one daily for left shoulder pain.  Area in back is a mole, do not scratch  Commit to daily exercise for 30 minutes and healthy eating

## 2017-01-25 NOTE — Progress Notes (Signed)
Letter given  

## 2017-01-25 NOTE — Progress Notes (Signed)
Preventive Screening-Counseling & Management   Patient present here today for a welcome to medicare  wellness visit.   Current Problems (verified)   Medications Prior to Visit Allergies (verified)   PAST HISTORY  Family History (VERIFIED)  Social History Married, disabled. Worked in [employer]. One child. Former smoker.   Risk Factors  Current exercise habits: Lifts dumbells at home but limited now due to left arm pain that started Sunday   Dietary issues discussed: Encouraged to limit fried and fatty foods and red meat and increase intake of fruits and vegetables    Cardiac risk factors: Type 2 DM,   Depression Screen  (Note: if answer to either of the following is "Yes", a more complete depression screening is indicated)   Over the past two weeks, have you felt down, depressed or hopeless? sometimes Over the past two weeks, have you felt little interest or pleasure in doing things? sometimes Have you lost interest or pleasure in daily life? No  Do you often feel hopeless? No  Do you cry easily over simple problems? No   Activities of Daily Living  In your present state of health, do you have any difficulty performing the following activities?  Driving?: No Managing money?: No Feeding yourself?:No Getting from bed to chair?:No Climbing a flight of stairs?:limited due to chronic back and knee pain Preparing food and eating?:No Bathing or showering?:No Getting dressed?:No Getting to the toilet?:No Using the toilet?:No Moving around from place to place?: uses cane often when walking   Fall Risk Assessment In the past year have you fallen or had a near fall?:twice  Are you currently taking any medications that make you dizzy?:No   Hearing Difficulties: No Do you often ask people to speak up or repeat themselves?:No Do you experience ringing or noises in your ears?:No Do you have difficulty understanding soft or whispered voices?:No  Cognitive  Testing  Alert? Yes Normal Appearance?Yes  Oriented to person? Yes Place? Yes  Time? Yes  Displays appropriate judgment?Yes  Can read the correct time from a watch face? yes Are you having problems remembering things?sometimes he forgets  Advanced Directives have been discussed with the patient?Yes. Will give brochure    List the Names of Other Physician/Practitioners you currently use:  Dr Gershon Crane ([facility])   Indicate any recent Medical Services you may have received from other than [facility] providers in the past year (date may be approximate).     Medicare Attestation  I have personally reviewed:  The patient's medical and social history  Their use of alcohol, tobacco or illicit drugs  Their current medications and supplements  The patient's functional ability including ADLs,fall risks, home safety risks, cognitive, and hearing and visual impairment  Diet and physical activities  Evidence for depression or mood disorders  The patient's weight, height, BMI, and visual acuity have been recorded in the chart. I have made referrals, counseling, and provided education to the patient based on review of the above and I have provided the patient with a written personalized care plan for preventive services.    Physical Exam BP 128/80   Pulse 86   Resp 16   Ht 5\' 8"  (1.727 m)   Wt 211 lb (95.7 kg)   SpO2 97%   BMI 32.08 kg/m    Assessment & Plan:  Welcome to Medicare preventive visit Welcome to medicare as documented

## 2017-02-10 DIAGNOSIS — I10 Essential (primary) hypertension: Secondary | ICD-10-CM | POA: Diagnosis not present

## 2017-02-10 DIAGNOSIS — Z125 Encounter for screening for malignant neoplasm of prostate: Secondary | ICD-10-CM | POA: Diagnosis not present

## 2017-02-10 DIAGNOSIS — E1169 Type 2 diabetes mellitus with other specified complication: Secondary | ICD-10-CM | POA: Diagnosis not present

## 2017-02-10 DIAGNOSIS — E669 Obesity, unspecified: Secondary | ICD-10-CM | POA: Diagnosis not present

## 2017-02-10 LAB — LIPID PANEL
CHOL/HDL RATIO: 2.8 ratio (ref <5.0)
CHOLESTEROL: 89 mg/dL (ref <200)
HDL: 32 mg/dL — ABNORMAL LOW (ref >40)
LDL CALC: 23 mg/dL (ref <100)
Triglycerides: 169 mg/dL — ABNORMAL HIGH (ref <150)
VLDL: 34 mg/dL — AB (ref <30)

## 2017-02-10 LAB — CBC
HCT: 42.9 % (ref 38.5–50.0)
HEMOGLOBIN: 14 g/dL (ref 13.2–17.1)
MCH: 30.1 pg (ref 27.0–33.0)
MCHC: 32.6 g/dL (ref 32.0–36.0)
MCV: 92.3 fL (ref 80.0–100.0)
MPV: 8.7 fL (ref 7.5–12.5)
PLATELETS: 305 10*3/uL (ref 140–400)
RBC: 4.65 MIL/uL (ref 4.20–5.80)
RDW: 13.9 % (ref 11.0–15.0)
WBC: 8.5 10*3/uL (ref 3.8–10.8)

## 2017-02-10 LAB — COMPLETE METABOLIC PANEL WITH GFR
ALT: 13 U/L (ref 9–46)
AST: 10 U/L (ref 10–35)
Albumin: 3.9 g/dL (ref 3.6–5.1)
Alkaline Phosphatase: 72 U/L (ref 40–115)
BUN: 13 mg/dL (ref 7–25)
CHLORIDE: 103 mmol/L (ref 98–110)
CO2: 29 mmol/L (ref 20–31)
Calcium: 9.2 mg/dL (ref 8.6–10.3)
Creat: 1.26 mg/dL — ABNORMAL HIGH (ref 0.70–1.25)
GFR, EST NON AFRICAN AMERICAN: 62 mL/min (ref >=60)
GFR, Est African American: 71 mL/min (ref >=60)
GLUCOSE: 173 mg/dL — AB (ref 65–99)
POTASSIUM: 4.3 mmol/L (ref 3.5–5.3)
SODIUM: 140 mmol/L (ref 135–146)
TOTAL PROTEIN: 6.8 g/dL (ref 6.1–8.1)
Total Bilirubin: 0.5 mg/dL (ref 0.2–1.2)

## 2017-02-10 LAB — PSA: PSA: 1.2 ng/mL (ref <=4.0)

## 2017-02-11 LAB — HEMOGLOBIN A1C
Hgb A1c MFr Bld: 9.2 % — ABNORMAL HIGH (ref <5.7)
MEAN PLASMA GLUCOSE: 217 mg/dL

## 2017-02-14 ENCOUNTER — Other Ambulatory Visit: Payer: Self-pay | Admitting: Family Medicine

## 2017-02-14 ENCOUNTER — Other Ambulatory Visit: Payer: Self-pay

## 2017-02-14 MED ORDER — GLIPIZIDE ER 5 MG PO TB24: 5.0000 mg | ORAL_TABLET | Freq: Every day | ORAL | 4 refills | Status: DC

## 2017-03-08 ENCOUNTER — Other Ambulatory Visit: Payer: Self-pay

## 2017-04-13 ENCOUNTER — Other Ambulatory Visit: Payer: Self-pay

## 2017-04-14 ENCOUNTER — Other Ambulatory Visit: Payer: Self-pay

## 2017-04-14 MED ORDER — GLIPIZIDE ER 10 MG PO TB24: 10.0000 mg | ORAL_TABLET | Freq: Every day | ORAL | 1 refills | Status: DC

## 2017-04-14 MED ORDER — GLIPIZIDE ER 5 MG PO TB24: 5.0000 mg | ORAL_TABLET | Freq: Every day | ORAL | 1 refills | Status: DC

## 2017-05-02 ENCOUNTER — Telehealth: Payer: Self-pay | Admitting: *Deleted

## 2017-05-02 DIAGNOSIS — E669 Obesity, unspecified: Secondary | ICD-10-CM

## 2017-05-02 DIAGNOSIS — E1169 Type 2 diabetes mellitus with other specified complication: Secondary | ICD-10-CM

## 2017-05-02 DIAGNOSIS — I10 Essential (primary) hypertension: Secondary | ICD-10-CM

## 2017-05-02 NOTE — Telephone Encounter (Signed)
Patient left a message on machine stating he received a call about labs a a1c and kidney check, patient is requesting a call back. [phone]

## 2017-05-03 NOTE — Telephone Encounter (Signed)
HRANDIBe needs his HBA1C, chem 7 and EGFR drawn on 05/13/2017 non fASTING PLEASE LET HOIM KNOW 

## 2017-05-06 ENCOUNTER — Other Ambulatory Visit: Payer: Self-pay | Admitting: Family Medicine

## 2017-05-06 NOTE — Telephone Encounter (Signed)
Message left and labs sent

## 2017-05-06 NOTE — Addendum Note (Signed)
Addended by: Eual Fines on: 05/06/2017 10:43 AM   Modules accepted: Orders

## 2017-05-14 DIAGNOSIS — E1169 Type 2 diabetes mellitus with other specified complication: Secondary | ICD-10-CM | POA: Diagnosis not present

## 2017-05-14 DIAGNOSIS — I10 Essential (primary) hypertension: Secondary | ICD-10-CM | POA: Diagnosis not present

## 2017-05-14 DIAGNOSIS — E669 Obesity, unspecified: Secondary | ICD-10-CM | POA: Diagnosis not present

## 2017-05-14 LAB — LIPID PANEL
CHOL/HDL RATIO: 3 ratio (ref <5.0)
Cholesterol: 99 mg/dL (ref <200)
HDL: 33 mg/dL — AB (ref >40)
LDL Cholesterol: 27 mg/dL (ref <100)
TRIGLYCERIDES: 196 mg/dL — AB (ref <150)
VLDL: 39 mg/dL — ABNORMAL HIGH (ref <30)

## 2017-05-15 LAB — CBC
HCT: 43.2 % (ref 38.5–50.0)
Hemoglobin: 14.2 g/dL (ref 13.2–17.1)
MCH: 30.3 pg (ref 27.0–33.0)
MCHC: 32.9 g/dL (ref 32.0–36.0)
MCV: 92.1 fL (ref 80.0–100.0)
MPV: 8.8 fL (ref 7.5–12.5)
PLATELETS: 307 10*3/uL (ref 140–400)
RBC: 4.69 MIL/uL (ref 4.20–5.80)
RDW: 14.7 % (ref 11.0–15.0)
WBC: 8.6 10*3/uL (ref 3.8–10.8)

## 2017-05-17 LAB — HEMOGLOBIN A1C
Hgb A1c MFr Bld: 10.1 % — ABNORMAL HIGH (ref <5.7)
Mean Plasma Glucose: 243 mg/dL

## 2017-05-25 ENCOUNTER — Encounter: Payer: Self-pay | Admitting: Family Medicine

## 2017-05-25 ENCOUNTER — Ambulatory Visit (INDEPENDENT_AMBULATORY_CARE_PROVIDER_SITE_OTHER): Payer: Medicare HMO | Admitting: Family Medicine

## 2017-05-25 ENCOUNTER — Other Ambulatory Visit (HOSPITAL_COMMUNITY)
Admission: AD | Admit: 2017-05-25 | Discharge: 2017-05-25 | Disposition: A | Discharge status: Home / Self Care | Payer: Medicare HMO | Source: Skilled Nursing Facility | Attending: Family Medicine | Admitting: Family Medicine

## 2017-05-25 VITALS — BP 132/82 | HR 93 | Temp 96.6°F | Resp 16 | Ht 68.0 in | Wt 210.8 lb

## 2017-05-25 DIAGNOSIS — F419 Anxiety disorder, unspecified: Secondary | ICD-10-CM | POA: Diagnosis not present

## 2017-05-25 DIAGNOSIS — I10 Essential (primary) hypertension: Secondary | ICD-10-CM | POA: Diagnosis not present

## 2017-05-25 DIAGNOSIS — K219 Gastro-esophageal reflux disease without esophagitis: Secondary | ICD-10-CM | POA: Diagnosis not present

## 2017-05-25 DIAGNOSIS — E1169 Type 2 diabetes mellitus with other specified complication: Secondary | ICD-10-CM

## 2017-05-25 DIAGNOSIS — F329 Major depressive disorder, single episode, unspecified: Secondary | ICD-10-CM | POA: Diagnosis not present

## 2017-05-25 DIAGNOSIS — E669 Obesity, unspecified: Secondary | ICD-10-CM | POA: Diagnosis not present

## 2017-05-25 DIAGNOSIS — M5440 Lumbago with sciatica, unspecified side: Secondary | ICD-10-CM

## 2017-05-25 DIAGNOSIS — E785 Hyperlipidemia, unspecified: Secondary | ICD-10-CM | POA: Diagnosis not present

## 2017-05-25 DIAGNOSIS — E6609 Other obesity due to excess calories: Secondary | ICD-10-CM | POA: Diagnosis not present

## 2017-05-25 MED ORDER — GLIPIZIDE 10 MG PO TABS: 10.0000 mg | ORAL_TABLET | Freq: Two times a day (BID) | ORAL | 1 refills | Status: DC

## 2017-05-25 MED ORDER — SPIRONOLACTONE 50 MG PO TABS: 50.0000 mg | ORAL_TABLET | Freq: Every day | ORAL | 1 refills | Status: DC

## 2017-05-25 MED ORDER — GLIPIZIDE 10 MG PO TABS: 10.0000 mg | ORAL_TABLET | Freq: Two times a day (BID) | ORAL | 3 refills | Status: DC

## 2017-05-25 MED ORDER — METFORMIN HCL 1000 MG PO TABS: 1000.0000 mg | ORAL_TABLET | Freq: Two times a day (BID) | ORAL | 1 refills | Status: DC

## 2017-05-25 MED ORDER — PRAVASTATIN SODIUM 20 MG PO TABS: 20.0000 mg | ORAL_TABLET | Freq: Every day | ORAL | 1 refills | Status: DC

## 2017-05-25 MED ORDER — AMLODIPINE BESYLATE 10 MG PO TABS: 10.0000 mg | ORAL_TABLET | Freq: Every day | ORAL | 1 refills | Status: DC

## 2017-05-25 MED ORDER — BUSPIRONE HCL 5 MG PO TABS: 5.0000 mg | ORAL_TABLET | Freq: Two times a day (BID) | ORAL | 1 refills | Status: DC

## 2017-05-25 NOTE — Patient Instructions (Addendum)
F/u in 3 month, pls get HBA1C, chem 7 and eGFR 1 week before visit , non fast  Glucotrol increased to 10 mg one twice daily  Metformin already at maximum dose  Microalb from office today  You are referred to [place] are referred to diabetic educator VERY IMPORTANT to go!, wife's number [phone] or your number  It is important that you exercise regularly at least 30 minutes 5 times a week. If you develop chest pain, have severe difficulty breathing, or feel very tired, stop exercising immediately and seek medical attention     Pls keep eye exam appt  Test and record TWICE daily blood sugar   Call if too high   Fasting 100 to 130  Bedtime  150 to 180.  Nothing but water after 8 pm  Only drink water   It is important that you exercise regularly at least 30 minutes 5 times a week. If you develop chest pain, have severe difficulty breathing, or feel very tired, stop exercising immediately and seek medical attention   .Thank you  for choosing [facility]. We consider it a privelige to serve you.  Delivering excellent health care in a caring and  compassionate way is our goal.  Partnering with you,  so that together we can achieve this goal is our strategy.

## 2017-05-27 LAB — MICROALBUMIN / CREATININE URINE RATIO
Creatinine, Urine: 159.9 mg/dL
MICROALB UR: 564.4 ug/mL — AB
Microalb Creat Ratio: 353 mg/g creat — ABNORMAL HIGH (ref 0.0–30.0)

## 2017-05-29 ENCOUNTER — Encounter: Payer: Self-pay | Admitting: Family Medicine

## 2017-05-29 DIAGNOSIS — E782 Mixed hyperlipidemia: Secondary | ICD-10-CM | POA: Insufficient documentation

## 2017-05-29 DIAGNOSIS — E785 Hyperlipidemia, unspecified: Secondary | ICD-10-CM | POA: Insufficient documentation

## 2017-05-29 NOTE — Assessment & Plan Note (Signed)
Controlled, no change in medication DASH diet and commitment to daily physical activity for a minimum of 30 minutes discussed and encouraged, as a part of hypertension management. The importance of attaining a healthy weight is also discussed.  BP/Weight 05/25/2017 01/25/2017 10/06/2016 07/14/2016 07/07/2016 04/21/2016 4/44/6190  Systolic BP 122 241 146 431 427 670 110  Diastolic BP 82 80 80 80 88 84 94  Wt. (Lbs) 210.75 211 215 205 200 - 211.4  BMI 32.04 32.08 32.69 31.17 30.41 - 30.33

## 2017-05-29 NOTE — Assessment & Plan Note (Signed)
Controlled, no change in medication  

## 2017-05-29 NOTE — Assessment & Plan Note (Addendum)
Unchanged Patient re-educated about  the importance of commitment to a  minimum of 150 minutes of exercise per week.  The importance of healthy food choices with portion control discussed. Encouraged to start a food diary, count calories and to consider  joining a support group. Sample diet sheets offered. Goals set by the patient for the next several months.   Weight /BMI 05/25/2017 01/25/2017 10/06/2016  WEIGHT 210 lb 12 oz 211 lb 215 lb  HEIGHT 5\' 8"  5\' 8"  5\' 8"   BMI 32.04 kg/m2 32.08 kg/m2 32.69 kg/m2

## 2017-05-29 NOTE — Assessment & Plan Note (Signed)
Hyperlipidemia:Low fat diet discussed and encouraged.   Lipid Panel  Lab Results  Component Value Date   CHOL 99 05/14/2017   HDL 33 (L) 05/14/2017   LDLCALC 27 05/14/2017   TRIG 196 (H) 05/14/2017   CHOLHDL 3.0 05/14/2017   Uncontrolled , needs to reduce fried and fatty foods

## 2017-05-29 NOTE — Assessment & Plan Note (Addendum)
Norman Peters is reminded of the importance of commitment to daily physical activity for 30 minutes or more, as able and the need to limit carbohydrate intake to 30 to 60 grams per meal to help with blood sugar control.   The need to take medication as prescribed, test blood sugar as directed, and to call between visits if there is a concern that blood sugar is uncontrolled is also discussed.   Norman Peters is reminded of the importance of daily foot exam, annual eye examination, and good blood sugar, blood pressure and cholesterol control. Uncontrolled increased dose of glucotrol and refer to DIABETIC EDUSATOR, PT TO CALL WITH QUESTIONS AND BLOOD SUGAR RANGES REVIEWED IN DETAIL  Diabetic Labs Latest Ref Rng & Units 05/25/2017 05/14/2017 02/10/2017 10/06/2016 02/10/2016  HbA1c <5.7 % - 10.1(H) 9.2(H) 7.3(H) 8.4(H)  Microalbumin Not Estab. ug/mL 564.4(H) - - - 111.4  Micro/Creat Ratio 0.0 - 30.0 mg/g creat 353.0(H) - - - 629(H)  Chol <200 mg/dL - 99 89 120 110(L)  HDL >40 mg/dL - 33(L) 32(L) 36(L) 38(L)  Calc LDL <100 mg/dL - 27 23 48 50  Triglycerides <150 mg/dL - 196(H) 169(H) 182(H) 110  Creatinine 0.70 - 1.25 mg/dL - - 1.26(H) 1.27(H) 1.09   BP/Weight 05/25/2017 01/25/2017 10/06/2016 07/14/2016 07/07/2016 04/21/2016 5/67/0141  Systolic BP 030 131 438 887 579 728 206  Diastolic BP 82 80 80 80 88 84 94  Wt. (Lbs) 210.75 211 215 205 200 - 211.4  BMI 32.04 32.08 32.69 31.17 30.41 - 30.33   Foot/eye exam completion dates Latest Ref Rng & Units 05/25/2017 04/21/2016  Eye Exam No Retinopathy - No Retinopathy  Foot Form Completion - Done -

## 2017-05-29 NOTE — Progress Notes (Signed)
Norman Peters     MRN: [id number]      DOB: 02-Dec-1955   HPI Norman Peters is here for follow up and re-evaluation of chronic medical conditions, medication management and review of any available recent lab and radiology data.  Preventive health is updated, specifically  Cancer screening and Immunization.   The PT denies any adverse reactions to current medications since the last visit.  There are no new concerns.  There are no specific complaints  Denies polyuria, polydipsia, blurred vision , or hypoglycemic episodes.   ROS Denies recent fever or chills. Denies sinus pressure, nasal congestion, ear pain or sore throat. Denies chest congestion, productive cough or wheezing. Denies chest pains, palpitations and leg swelling Denies abdominal pain, nausea, vomiting,diarrhea or constipation.   Denies dysuria, frequency, hesitancy or incontinence. Denies joint pain, swelling and limitation in mobility. Denies headaches, seizures, numbness, or tingling. Denies depression, anxiety or insomnia. Denies skin break down or rash.   PE  BP 132/82 (BP Location: Left Arm, Patient Position: Sitting, Cuff Size: Normal)   Pulse 93   Temp (!) 96.6 F (35.9 C) (Other (Comment))   Resp 16   Ht 5\' 8"  (1.727 m)   Wt 210 lb 12 oz (95.6 kg)   SpO2 98%   BMI 32.04 kg/m   Patient alert and oriented and in no cardiopulmonary distress.  HEENT: No facial asymmetry, EOMI,   oropharynx pink and moist.  Neck supple no JVD, no mass.  Chest: Clear to auscultation bilaterally.  CVS: S1, S2 no murmurs, no S3.Regular rate.  ABD: Soft non tender.   Ext: No edema  MS: Adequate ROM spine, shoulders, hips and knees.  Skin: Intact, no ulcerations or rash noted.  Psych: Good eye contact, normal affect. Memory intact not anxious or depressed appearing.  CNS: CN 2-12 intact, power,  normal throughout.no focal deficits noted.   Assessment & Plan  Hypertension goal BP (blood pressure) < 130/80 Controlled,  no change in medication DASH diet and commitment to daily physical activity for a minimum of 30 minutes discussed and encouraged, as a part of hypertension management. The importance of attaining a healthy weight is also discussed.  BP/Weight 05/25/2017 01/25/2017 10/06/2016 07/14/2016 07/07/2016 04/21/2016 3/71/0626  Systolic BP 948 546 270 350 093 818 299  Diastolic BP 82 80 80 80 88 84 94  Wt. (Lbs) 210.75 211 215 205 200 - 211.4  BMI 32.04 32.08 32.69 31.17 30.41 - 30.33       Diabetes mellitus type 2 in obese [facility]) Norman Peters is reminded of the importance of commitment to daily physical activity for 30 minutes or more, as able and the need to limit carbohydrate intake to 30 to 60 grams per meal to help with blood sugar control.   The need to take medication as prescribed, test blood sugar as directed, and to call between visits if there is a concern that blood sugar is uncontrolled is also discussed.   Norman Peters is reminded of the importance of daily foot exam, annual eye examination, and good blood sugar, blood pressure and cholesterol control. Uncontrolled increased dose of glucotrol and refer to DIABETIC EDUSATOR, PT TO CALL WITH QUESTIONS AND BLOOD SUGAR RANGES REVIEWED IN DETAIL  Diabetic Labs Latest Ref Rng & Units 05/25/2017 05/14/2017 02/10/2017 10/06/2016 02/10/2016  HbA1c <5.7 % - 10.1(H) 9.2(H) 7.3(H) 8.4(H)  Microalbumin Not Estab. ug/mL 564.4(H) - - - 111.4  Micro/Creat Ratio 0.0 - 30.0 mg/g creat 353.0(H) - - - 629(H)  Chol <  200 mg/dL - 99 89 120 110(L)  HDL >40 mg/dL - 33(L) 32(L) 36(L) 38(L)  Calc LDL <100 mg/dL - 27 23 48 50  Triglycerides <150 mg/dL - 196(H) 169(H) 182(H) 110  Creatinine 0.70 - 1.25 mg/dL - - 1.26(H) 1.27(H) 1.09   BP/Weight 05/25/2017 01/25/2017 10/06/2016 07/14/2016 07/07/2016 04/21/2016 0/92/3300  Systolic BP 762 263 335 456 256 389 373  Diastolic BP 82 80 80 80 88 84 94  Wt. (Lbs) 210.75 211 215 205 200 - 211.4  BMI 32.04 32.08 32.69 31.17 30.41 - 30.33    Foot/eye exam completion dates Latest Ref Rng & Units 05/25/2017 04/21/2016  Eye Exam No Retinopathy - No Retinopathy  Foot Form Completion - Done -        Obesity Unchanged Patient re-educated about  the importance of commitment to a  minimum of 150 minutes of exercise per week.  The importance of healthy food choices with portion control discussed. Encouraged to start a food diary, count calories and to consider  joining a support group. Sample diet sheets offered. Goals set by the patient for the next several months.   Weight /BMI 05/25/2017 01/25/2017 10/06/2016  WEIGHT 210 lb 12 oz 211 lb 215 lb  HEIGHT 5\' 8"  5\' 8"  5\' 8"   BMI 32.04 kg/m2 32.08 kg/m2 32.69 kg/m2      Anxiety and depression Controlled, no change in medication   Back pain of lumbar region with sciatica Controlled, no change in medication   GERD (gastroesophageal reflux disease) Controlled, no change in medication   Hyperlipidemia LDL goal <100 Hyperlipidemia:Low fat diet discussed and encouraged.   Lipid Panel  Lab Results  Component Value Date   CHOL 99 05/14/2017   HDL 33 (L) 05/14/2017   LDLCALC 27 05/14/2017   TRIG 196 (H) 05/14/2017   CHOLHDL 3.0 05/14/2017   Uncontrolled , needs to reduce fried and fatty foods

## 2017-06-06 ENCOUNTER — Telehealth: Payer: Self-pay

## 2017-06-06 DIAGNOSIS — E1169 Type 2 diabetes mellitus with other specified complication: Secondary | ICD-10-CM

## 2017-06-06 DIAGNOSIS — E669 Obesity, unspecified: Principal | ICD-10-CM

## 2017-06-06 NOTE — Telephone Encounter (Signed)
referred to diabetic ed

## 2017-07-07 ENCOUNTER — Encounter: Payer: Medicare HMO | Attending: Family Medicine | Admitting: Nutrition

## 2017-07-07 VITALS — Ht 68.0 in | Wt 202.0 lb

## 2017-07-07 DIAGNOSIS — E118 Type 2 diabetes mellitus with unspecified complications: Secondary | ICD-10-CM

## 2017-07-07 DIAGNOSIS — Z713 Dietary counseling and surveillance: Secondary | ICD-10-CM | POA: Insufficient documentation

## 2017-07-07 DIAGNOSIS — E119 Type 2 diabetes mellitus without complications: Secondary | ICD-10-CM | POA: Diagnosis not present

## 2017-07-07 DIAGNOSIS — E669 Obesity, unspecified: Secondary | ICD-10-CM

## 2017-07-07 DIAGNOSIS — E1165 Type 2 diabetes mellitus with hyperglycemia: Secondary | ICD-10-CM

## 2017-07-07 DIAGNOSIS — IMO0002 Reserved for concepts with insufficient information to code with codable children: Secondary | ICD-10-CM

## 2017-07-07 NOTE — Progress Notes (Signed)
Diabetes Self-Management Education  Visit Type: First/Initial  Appt. Start Time: 130Appt. End Time: 1430  07/07/2017  [name], identified by name and date of birth, is a 61 y.o. male with a diagnosis of Diabetes: Type 2. Sees Dr. Moshe Peters. Had DM for 10+ years. Didn't take care of his DM for a long time. Has cut out sweets, sodas, junk food. Eating things more baked and broiled now. Eating better quality foods. BS have improved very nicely into the low 100's. He feels better.    Working on increasing exercising.  He is motivated to continue to eat healthier to improve his DM. Testing 3-4 times per day. BS are WNL now. ON Metformin 1000 mg BID and Glipizide 5 mg BID.  Making excellent progress. Avg BS 140's.   ASSESSMENT  Height 5\' 8"  (1.727 m), weight 202 lb (91.6 kg). Body mass index is 30.71 kg/m.      Diabetes Self-Management Education - 07/07/17 1403      Visit Information   Visit Type First/Initial     Initial Visit   Diabetes Type Type 2   Are you currently following a meal plan? No   Are you taking your medications as prescribed? Yes   Date Diagnosed 2008     Health Coping   How would you rate your overall health? Good     Psychosocial Assessment   Patient Belief/Attitude about Diabetes Motivated to manage diabetes   Self-care barriers Low literacy;Impaired vision   Other persons present Patient   Patient Concerns Nutrition/Meal planning;Medication;Monitoring;Healthy Lifestyle;Weight Control   Special Needs None   Preferred Learning Style No preference indicated   Learning Readiness Ready   How often do you need to have someone help you when you read instructions, pamphlets, or other written materials from your doctor or pharmacy? 3 - Sometimes   What is the last grade level you completed in school? 10     Pre-Education Assessment   Patient understands the diabetes disease and treatment process. Needs Instruction   Patient understands incorporating  nutritional management into lifestyle. Needs Instruction   Patient undertands incorporating physical activity into lifestyle. Needs Instruction   Patient understands using medications safely. Needs Instruction   Patient understands monitoring blood glucose, interpreting and using results Needs Instruction   Patient understands prevention, detection, and treatment of acute complications. Needs Instruction   Patient understands prevention, detection, and treatment of chronic complications. Needs Instruction   Patient understands how to develop strategies to address psychosocial issues. Needs Instruction   Patient understands how to develop strategies to promote health/change behavior. Needs Instruction     Complications   Last HgB A1C per patient/outside source 10.1 %   How often do you check your blood sugar? 1-2 times/day   Fasting Blood glucose range (mg/dL) 70-129   Postprandial Blood glucose range (mg/dL) 130-179   Number of hypoglycemic episodes per month 0   Number of hyperglycemic episodes per week 0   Have you had a dilated eye exam in the past 12 months? No  GOIng in the next month   Have you had a dental exam in the past 12 months? No   Are you checking your feet? No   How many days per week are you checking your feet? 7     Dietary Intake   Breakfast SKipped   Lunch watermelon,  saltine crackers,  water or cystal light   Dinner Chicken legs 2, boiled, sardines and crackers -8-10, water   Snack (evening) sometimes  popcorn   Beverage(s) water . crystal light     Exercise   Exercise Type ADL's;Light (walking / raking leaves)   How many days per week to you exercise? 2   How many minutes per day do you exercise? 30   Total minutes per week of exercise 60      Individualized Plan for Diabetes Self-Management Training:   Learning Objective:  Patient will have a greater understanding of diabetes self-management. Patient education plan is to attend individual and/or group  sessions per assessed needs and concerns.   Plan:   Patient Instructions  Goals 1. Follow My Plate 2. Eat 3-4 carb choices per meal 3. Keep drinking only water-5 bottles 4. Increase fresh fruits and vegetables. 5. Walk 30 minutes  3-4 times per week. Prevent low blood sugars Take medication of Metformin after breakfast and after dinner Keep testing blood sugar before breakfast and bedtime and bring bring and log sheets to appts.   Expected Outcomes:  Demonstrated interest in learning. Expect positive outcomes  Education material provided: Living Well with Diabetes, A1C conversion sheet, Meal plan card, My Plate and Carbohydrate counting sheet  If problems or questions, patient to contact team via:  Phone and Email  Future DSME appointment:  3 months

## 2017-07-07 NOTE — Patient Instructions (Signed)
Goals 1. Follow My Plate 2. Eat 3-4 carb choices per meal 3. Keep drinking only water-5 bottles 4. Increase fresh fruits and vegetables. 5. Walk 30 minutes  3-4 times per week. Prevent low blood sugars Take medication of Metformin after breakfast and after dinner Keep testing blood sugar before breakfast and bedtime and bring bring and log sheets to appts.

## 2017-07-13 ENCOUNTER — Ambulatory Visit: Payer: Self-pay | Admitting: Podiatry

## 2017-07-25 ENCOUNTER — Ambulatory Visit: Payer: Medicare HMO | Admitting: Family Medicine

## 2017-08-03 DIAGNOSIS — H5212 Myopia, left eye: Secondary | ICD-10-CM | POA: Diagnosis not present

## 2017-08-03 DIAGNOSIS — H524 Presbyopia: Secondary | ICD-10-CM | POA: Diagnosis not present

## 2017-08-03 DIAGNOSIS — H2513 Age-related nuclear cataract, bilateral: Secondary | ICD-10-CM | POA: Diagnosis not present

## 2017-08-03 DIAGNOSIS — E119 Type 2 diabetes mellitus without complications: Secondary | ICD-10-CM | POA: Diagnosis not present

## 2017-08-03 DIAGNOSIS — H52201 Unspecified astigmatism, right eye: Secondary | ICD-10-CM | POA: Diagnosis not present

## 2017-08-12 ENCOUNTER — Encounter: Payer: Self-pay | Admitting: Podiatry

## 2017-08-12 ENCOUNTER — Ambulatory Visit (INDEPENDENT_AMBULATORY_CARE_PROVIDER_SITE_OTHER): Payer: Medicare HMO | Admitting: Podiatry

## 2017-08-12 VITALS — BP 142/84 | HR 81

## 2017-08-12 DIAGNOSIS — M79674 Pain in right toe(s): Secondary | ICD-10-CM

## 2017-08-12 DIAGNOSIS — L608 Other nail disorders: Secondary | ICD-10-CM | POA: Diagnosis not present

## 2017-08-12 DIAGNOSIS — E119 Type 2 diabetes mellitus without complications: Secondary | ICD-10-CM | POA: Diagnosis not present

## 2017-08-12 DIAGNOSIS — M79675 Pain in left toe(s): Secondary | ICD-10-CM | POA: Diagnosis not present

## 2017-08-12 DIAGNOSIS — B351 Tinea unguium: Secondary | ICD-10-CM

## 2017-08-12 NOTE — Progress Notes (Signed)
   Subjective:    Patient ID: Norman Peters, male    DOB: 1956-04-29, 60 y.o.   MRN: [id number]  HPI this patient presents the office for an evaluation of his diabetic feet.  He states he has diabetes and only an occasional numbness and tingling in his feet.  He states that his nails have grown thick and long and become painful walking and wearing his shoes.  Patient states he is unable to self treat.  Review of Systems  HENT: Positive for rhinorrhea.   Musculoskeletal: Positive for back pain.       Joint pain  Allergic/Immunologic: Positive for food allergies.       Objective:   Physical Exam General Appearance  Alert, conversant and in no acute stress.  Vascular  Dorsalis pedis and posterior pulses are palpable  bilaterally.  Capillary return is within normal limits  Bilaterally. Temperature is within normal limits  Bilaterally  Neurologic  Senn-Weinstein monofilament wire test within normal limits  bilaterally. Muscle power  Within normal limits bilaterally.  Nails Thick disfigured discolored nails with subungual debride bilaterally from hallux to fifth toes bilaterally. Pincer toenails  B/L. No evidence of bacterial infection or drainage bilaterally.  Orthopedic  No limitations of motion of motion feet bilaterally.  No crepitus or effusions noted.  No bony pathology or digital deformities noted.  Skin  normotropic skin with no porokeratosis noted bilaterally.  No signs of infections or ulcers noted.          Assessment & Plan:  Onychomycosis  B/L  Diabetes with no foot complications.  IE  Debridement of nails.  RTC 3 months.   Gardiner Barefoot DPM

## 2017-08-18 ENCOUNTER — Ambulatory Visit: Payer: Medicare HMO | Admitting: Nutrition

## 2017-08-19 ENCOUNTER — Telehealth: Payer: Self-pay | Admitting: Family Medicine

## 2017-08-19 NOTE — Telephone Encounter (Signed)
Patient called re the Rx that he receives from [facility]  He got the metformin , amlodipine, and spironolactone  They left out Prozac 20 mg cap  and Glucotrol 10 mg tab.  (bother generic)     Any questions please call patient cb  [phone]

## 2017-08-22 MED ORDER — GLIPIZIDE 10 MG PO TABS: 10.0000 mg | ORAL_TABLET | Freq: Two times a day (BID) | ORAL | 1 refills | Status: DC

## 2017-08-22 MED ORDER — FLUOXETINE HCL 20 MG PO CAPS: 20.0000 mg | ORAL_CAPSULE | Freq: Every day | ORAL | 1 refills | Status: DC

## 2017-08-24 ENCOUNTER — Other Ambulatory Visit: Payer: Self-pay | Admitting: Family Medicine

## 2017-08-25 ENCOUNTER — Encounter: Payer: Self-pay | Admitting: Family Medicine

## 2017-08-25 ENCOUNTER — Ambulatory Visit (INDEPENDENT_AMBULATORY_CARE_PROVIDER_SITE_OTHER): Payer: Medicare HMO | Admitting: Family Medicine

## 2017-08-25 ENCOUNTER — Other Ambulatory Visit: Payer: Self-pay | Admitting: Family Medicine

## 2017-08-25 VITALS — BP 136/82 | HR 78 | Temp 98.7°F | Resp 16 | Ht 68.0 in | Wt 208.2 lb

## 2017-08-25 DIAGNOSIS — E669 Obesity, unspecified: Secondary | ICD-10-CM

## 2017-08-25 DIAGNOSIS — E785 Hyperlipidemia, unspecified: Secondary | ICD-10-CM

## 2017-08-25 DIAGNOSIS — Z23 Encounter for immunization: Secondary | ICD-10-CM | POA: Diagnosis not present

## 2017-08-25 DIAGNOSIS — I10 Essential (primary) hypertension: Secondary | ICD-10-CM | POA: Diagnosis not present

## 2017-08-25 DIAGNOSIS — E1169 Type 2 diabetes mellitus with other specified complication: Secondary | ICD-10-CM

## 2017-08-25 DIAGNOSIS — F419 Anxiety disorder, unspecified: Secondary | ICD-10-CM

## 2017-08-25 DIAGNOSIS — F329 Major depressive disorder, single episode, unspecified: Secondary | ICD-10-CM

## 2017-08-25 DIAGNOSIS — F32A Depression, unspecified: Secondary | ICD-10-CM

## 2017-08-25 NOTE — Progress Notes (Signed)
Zackari Ruane     MRN: [id number]      DOB: [birth year]   HPI Mr. Kolodziejski is here for follow up and re-evaluation of chronic medical conditions, medication management and review of any available recent lab and radiology data.  Preventive health is updated, specifically  Cancer screening and Immunization.   Questions or concerns regarding consultations or procedures which the PT has had in the interim are  addressed. The PT denies any adverse reactions to current medications since the last visit.  Has had some low blood sugar episodes, tests twice daily and records, bedtime sugars 140 but snacks after this every night ROS Denies recent fever or chills. Denies sinus pressure, nasal congestion, ear pain or sore throat. Denies chest congestion, productive cough or wheezing. Denies chest pains, palpitations and leg swelling Denies abdominal pain, nausea, vomiting,diarrhea or constipation.   Denies dysuria, frequency, hesitancy or incontinence. Denies joint pain, swelling and limitation in mobility. Denies headaches, seizures, numbness, or tingling. Denies depression, anxiety or insomnia. Denies skin break down or rash.   PE  BP 136/82 (BP Location: Left Arm, Patient Position: Sitting, Cuff Size: Normal)   Pulse 78   Temp 98.7 F (37.1 C) (Other (Comment))   Resp 16   Ht 5\' 8"  (1.727 m)   Wt 208 lb 4 oz (94.5 kg)   SpO2 98%   BMI 31.66 kg/m   Patient alert and oriented and in no cardiopulmonary distress.  HEENT: No facial asymmetry, EOMI,   oropharynx pink and moist.  Neck supple no JVD, no mass.  Chest: Clear to auscultation bilaterally.  CVS: S1, S2 no murmurs, no S3.Regular rate.  ABD: Soft non tender.   Ext: No edema  MS: Adequate ROM spine, shoulders, hips and knees.  Skin: Intact, no ulcerations or rash noted.  Psych: Good eye contact, normal affect. Memory intact not anxious or depressed appearing.  CNS: CN 2-12 intact, power,  normal throughout.no focal deficits  noted.   Assessment & Plan  Hypertension goal BP (blood pressure) < 130/80 Optimal control, no med change DASH diet and commitment to daily physical activity for a minimum of 30 minutes discussed and encouraged, as a part of hypertension management. The importance of attaining a healthy weight is also discussed.  BP/Weight 08/25/2017 08/12/2017 07/07/2017 05/25/2017 01/25/2017 82/42/3536 11/11/4313  Systolic BP 400 867 - 619 509 326 712  Diastolic BP 82 84 - 82 80 80 80  Wt. (Lbs) 208.25 - 202 210.75 211 215 205  BMI 31.66 - 30.71 32.04 32.08 32.69 31.17       Hyperlipidemia LDL goal <100 Hyperlipidemia:Low fat diet discussed and encouraged.   Lipid Panel  Lab Results  Component Value Date   CHOL 99 05/14/2017   HDL 33 (L) 05/14/2017   LDLCALC 27 05/14/2017   TRIG 196 (H) 05/14/2017   CHOLHDL 3.0 05/14/2017   Updated lab needed at/ before next visit. Not at goal, needs to reduce fat intake and increase activity  '  Obesity Deteriorated. Patient re-educated about  the importance of commitment to a  minimum of 150 minutes of exercise per week.  The importance of healthy food choices with portion control discussed. Encouraged to start a food diary, count calories and to consider  joining a support group. Sample diet sheets offered. Goals set by the patient for the next several months.   Weight /BMI 08/25/2017 07/07/2017 05/25/2017  WEIGHT 208 lb 4 oz 202 lb 210 lb 12 oz  HEIGHT 5\' 8"  5\' 8"   5\' 8"   BMI 31.66 kg/m2 30.71 kg/m2 32.04 kg/m2      Diabetes mellitus type 2 in obese (HCC) Improved, but still not at goal, will continue to work with diabetic educator , nomedication changes yet Mr. Breithaupt is reminded of the importance of commitment to daily physical activity for 30 minutes or more, as able and the need to limit carbohydrate intake to 30 to 60 grams per meal to help with blood sugar control.   The need to take medication as prescribed, test blood sugar as directed,  and to call between visits if there is a concern that blood sugar is uncontrolled is also discussed.   Mr. Mroczka is reminded of the importance of daily foot exam, annual eye examination, and good blood sugar, blood pressure and cholesterol control.  Diabetic Labs Latest Ref Rng & Units 08/25/2017 05/25/2017 05/14/2017 02/10/2017 10/06/2016  HbA1c <5.7 % of total Hgb 8.5(H) - 10.1(H) 9.2(H) 7.3(H)  Microalbumin Not Estab. ug/mL - 564.4(H) - - -  Micro/Creat Ratio 0.0 - 30.0 mg/g creat - 353.0(H) - - -  Chol <200 mg/dL - - 99 89 120  HDL >40 mg/dL - - 33(L) 32(L) 36(L)  Calc LDL <100 mg/dL - - 27 23 48  Triglycerides <150 mg/dL - - 196(H) 169(H) 182(H)  Creatinine 0.70 - 1.25 mg/dL 1.23 - - 1.26(H) 1.27(H)   BP/Weight 08/25/2017 08/12/2017 07/07/2017 05/25/2017 01/25/2017 12/45/8099 06/10/3824  Systolic BP 053 976 - 734 193 790 240  Diastolic BP 82 84 - 82 80 80 80  Wt. (Lbs) 208.25 - 202 210.75 211 215 205  BMI 31.66 - 30.71 32.04 32.08 32.69 31.17   Foot/eye exam completion dates Latest Ref Rng & Units 05/25/2017 04/21/2016  Eye Exam No Retinopathy - No Retinopathy  Foot Form Completion - Done -        Anxiety and depression Controlled, no change in medication

## 2017-08-25 NOTE — Patient Instructions (Addendum)
Annual physical exam in 3.5  Months, call if you nee me before  HBA1C, chem 7 and EGFr today ( if possible)  Fasting lipid, cmp and EGFR and hBA1C  1 week before appointment in 3.5 months   Flu vaccine today  Please monitor bedtiome snacks and standardize what you eat.  We will call with lab result  You seem to be doing much better  Thanks for choosing [facility], we consider it a privelige to serve you.  It is important that you exercise regularly at least 30 minutes 5 times a week. If you develop chest pain, have severe difficulty breathing, or feel very tired, stop exercising immediately and seek medical attention     

## 2017-08-26 LAB — COMPLETE METABOLIC PANEL WITH GFR
AG RATIO: 1.4 (calc) (ref 1.0–2.5)
ALBUMIN MSPROF: 4.2 g/dL (ref 3.6–5.1)
ALKALINE PHOSPHATASE (APISO): 74 U/L (ref 40–115)
ALT: 17 U/L (ref 9–46)
AST: 11 U/L (ref 10–35)
BUN: 14 mg/dL (ref 7–25)
CO2: 28 mmol/L (ref 20–32)
CREATININE: 1.23 mg/dL (ref 0.70–1.25)
Calcium: 9.8 mg/dL (ref 8.6–10.3)
Chloride: 101 mmol/L (ref 98–110)
GFR, Est African American: 73 mL/min/{1.73_m2} (ref >=60)
GFR, Est Non African American: 63 mL/min/{1.73_m2} (ref >=60)
GLOBULIN: 3 g/dL (ref 1.9–3.7)
Glucose, Bld: 182 mg/dL — ABNORMAL HIGH (ref 65–139)
POTASSIUM: 4.5 mmol/L (ref 3.5–5.3)
SODIUM: 138 mmol/L (ref 135–146)
Total Bilirubin: 0.7 mg/dL (ref 0.2–1.2)
Total Protein: 7.2 g/dL (ref 6.1–8.1)

## 2017-08-26 LAB — HEMOGLOBIN A1C
EAG (MMOL/L): 10.9 (calc)
HEMOGLOBIN A1C: 8.5 %{Hb} — AB (ref <5.7)
MEAN PLASMA GLUCOSE: 197 (calc)

## 2017-08-28 NOTE — Assessment & Plan Note (Signed)
Hyperlipidemia:Low fat diet discussed and encouraged.   Lipid Panel  Lab Results  Component Value Date   CHOL 99 05/14/2017   HDL 33 (L) 05/14/2017   LDLCALC 27 05/14/2017   TRIG 196 (H) 05/14/2017   CHOLHDL 3.0 05/14/2017   Updated lab needed at/ before next visit. Not at goal, needs to reduce fat intake and increase activity  '

## 2017-08-28 NOTE — Assessment & Plan Note (Signed)
Deteriorated. Patient re-educated about  the importance of commitment to a  minimum of 150 minutes of exercise per week.  The importance of healthy food choices with portion control discussed. Encouraged to start a food diary, count calories and to consider  joining a support group. Sample diet sheets offered. Goals set by the patient for the next several months.   Weight /BMI 08/25/2017 07/07/2017 05/25/2017  WEIGHT 208 lb 4 oz 202 lb 210 lb 12 oz  HEIGHT 5\' 8"  5\' 8"  5\' 8"   BMI 31.66 kg/m2 30.71 kg/m2 32.04 kg/m2

## 2017-08-28 NOTE — Assessment & Plan Note (Signed)
Improved, but still not at goal, will continue to work with diabetic educator , nomedication changes yet Norman Peters is reminded of the importance of commitment to daily physical activity for 30 minutes or more, as able and the need to limit carbohydrate intake to 30 to 60 grams per meal to help with blood sugar control.   The need to take medication as prescribed, test blood sugar as directed, and to call between visits if there is a concern that blood sugar is uncontrolled is also discussed.   Norman Peters is reminded of the importance of daily foot exam, annual eye examination, and good blood sugar, blood pressure and cholesterol control.  Diabetic Labs Latest Ref Rng & Units 08/25/2017 05/25/2017 05/14/2017 02/10/2017 10/06/2016  HbA1c <5.7 % of total Hgb 8.5(H) - 10.1(H) 9.2(H) 7.3(H)  Microalbumin Not Estab. ug/mL - 564.4(H) - - -  Micro/Creat Ratio 0.0 - 30.0 mg/g creat - 353.0(H) - - -  Chol <200 mg/dL - - 99 89 120  HDL >40 mg/dL - - 33(L) 32(L) 36(L)  Calc LDL <100 mg/dL - - 27 23 48  Triglycerides <150 mg/dL - - 196(H) 169(H) 182(H)  Creatinine 0.70 - 1.25 mg/dL 1.23 - - 1.26(H) 1.27(H)   BP/Weight 08/25/2017 08/12/2017 07/07/2017 05/25/2017 01/25/2017 00/86/7619 5/0/9326  Systolic BP 712 458 - 099 833 825 053  Diastolic BP 82 84 - 82 80 80 80  Wt. (Lbs) 208.25 - 202 210.75 211 215 205  BMI 31.66 - 30.71 32.04 32.08 32.69 31.17   Foot/eye exam completion dates Latest Ref Rng & Units 05/25/2017 04/21/2016  Eye Exam No Retinopathy - No Retinopathy  Foot Form Completion - Done -

## 2017-08-28 NOTE — Assessment & Plan Note (Signed)
Optimal control, no med change DASH diet and commitment to daily physical activity for a minimum of 30 minutes discussed and encouraged, as a part of hypertension management. The importance of attaining a healthy weight is also discussed.  BP/Weight 08/25/2017 08/12/2017 07/07/2017 05/25/2017 01/25/2017 28/31/5176 11/14/735  Systolic BP 106 269 - 485 462 703 500  Diastolic BP 82 84 - 82 80 80 80  Wt. (Lbs) 208.25 - 202 210.75 211 215 205  BMI 31.66 - 30.71 32.04 32.08 32.69 31.17

## 2017-08-28 NOTE — Assessment & Plan Note (Signed)
Controlled, no change in medication  

## 2017-08-29 ENCOUNTER — Ambulatory Visit: Payer: Medicare HMO | Admitting: Nutrition

## 2017-09-02 ENCOUNTER — Other Ambulatory Visit: Payer: Self-pay | Admitting: Family Medicine

## 2017-09-08 ENCOUNTER — Ambulatory Visit: Payer: Medicare HMO | Admitting: Nutrition

## 2017-10-18 ENCOUNTER — Other Ambulatory Visit: Payer: Self-pay | Admitting: Family Medicine

## 2017-10-18 DIAGNOSIS — I10 Essential (primary) hypertension: Secondary | ICD-10-CM

## 2017-10-18 DIAGNOSIS — F419 Anxiety disorder, unspecified: Principal | ICD-10-CM

## 2017-10-18 DIAGNOSIS — F329 Major depressive disorder, single episode, unspecified: Secondary | ICD-10-CM

## 2017-11-11 ENCOUNTER — Ambulatory Visit: Payer: Medicare HMO | Admitting: Podiatry

## 2017-11-17 ENCOUNTER — Telehealth: Payer: Self-pay | Admitting: Family Medicine

## 2017-11-17 NOTE — Telephone Encounter (Signed)
CALLED PATIENT TO R/S CPE PER R/S LIST FOR 12/01/17

## 2017-11-21 ENCOUNTER — Encounter: Payer: Self-pay | Admitting: Family Medicine

## 2017-11-21 ENCOUNTER — Other Ambulatory Visit: Payer: Self-pay

## 2017-11-21 ENCOUNTER — Ambulatory Visit (INDEPENDENT_AMBULATORY_CARE_PROVIDER_SITE_OTHER): Payer: Medicare HMO | Admitting: Family Medicine

## 2017-11-21 VITALS — BP 124/82 | HR 94 | Resp 16 | Ht 68.0 in | Wt 211.0 lb

## 2017-11-21 DIAGNOSIS — I10 Essential (primary) hypertension: Secondary | ICD-10-CM

## 2017-11-21 DIAGNOSIS — F32A Depression, unspecified: Secondary | ICD-10-CM

## 2017-11-21 DIAGNOSIS — E669 Obesity, unspecified: Secondary | ICD-10-CM

## 2017-11-21 DIAGNOSIS — Z1211 Encounter for screening for malignant neoplasm of colon: Secondary | ICD-10-CM | POA: Diagnosis not present

## 2017-11-21 DIAGNOSIS — F419 Anxiety disorder, unspecified: Secondary | ICD-10-CM

## 2017-11-21 DIAGNOSIS — E785 Hyperlipidemia, unspecified: Secondary | ICD-10-CM

## 2017-11-21 DIAGNOSIS — E1169 Type 2 diabetes mellitus with other specified complication: Secondary | ICD-10-CM | POA: Diagnosis not present

## 2017-11-21 DIAGNOSIS — Z0000 Encounter for general adult medical examination without abnormal findings: Secondary | ICD-10-CM | POA: Diagnosis not present

## 2017-11-21 DIAGNOSIS — F329 Major depressive disorder, single episode, unspecified: Secondary | ICD-10-CM

## 2017-11-21 DIAGNOSIS — F418 Other specified anxiety disorders: Secondary | ICD-10-CM | POA: Diagnosis not present

## 2017-11-21 LAB — POC HEMOCCULT BLD/STL (OFFICE/1-CARD/DIAGNOSTIC): FECAL OCCULT BLD: NEGATIVE

## 2017-11-21 MED ORDER — GLIPIZIDE 10 MG PO TABS: 10.0000 mg | ORAL_TABLET | Freq: Two times a day (BID) | ORAL | 1 refills | Status: DC

## 2017-11-21 MED ORDER — FLUOXETINE HCL 20 MG PO CAPS: 20.0000 mg | ORAL_CAPSULE | Freq: Every day | ORAL | 1 refills | Status: DC

## 2017-11-21 MED ORDER — FLUOXETINE HCL 10 MG PO CAPS: 10.0000 mg | ORAL_CAPSULE | Freq: Every day | ORAL | 3 refills | Status: DC

## 2017-11-21 MED ORDER — PRAVASTATIN SODIUM 20 MG PO TABS: 20.0000 mg | ORAL_TABLET | Freq: Every day | ORAL | 1 refills | Status: DC

## 2017-11-21 NOTE — Assessment & Plan Note (Signed)
Uncontrolled  PHQ 9 score of 18, increase fluopxetine to 30 mg and refer to therapist

## 2017-11-21 NOTE — Patient Instructions (Addendum)
F/u early March, call if you need me sooner  Increase fluoxetine to 30 mg daily, you will get 10 mg capsule additionally sent from [facility] , take with the 20 mg dose   Please watch food choice so blood sugar improves, or else you will need insulin  Drink only water and start exercise 30 minutes daily  You are referred to therapy , the office will call you  Fasting lipid, cmp and eGFR, HBA1C and TSH 5 days before next visit  Thank you  for choosing [facility]. We consider it a privelige to serve you.  Delivering excellent health care in a caring and  compassionate way is our goal.  Partnering with you,  so that together we can achieve this goal is our strategy.

## 2017-11-21 NOTE — Assessment & Plan Note (Signed)

## 2017-11-21 NOTE — Progress Notes (Signed)
Norman Peters     MRN: [id number]      DOB: 05-07-1956   HPI: Patient is in for annual physical exam. Discussed and assessed depression , marital discord, and uncontrolled blood sugar , FBG , ranging from 160 best , often 250, states he overeats because of stress , depression.  Immunization is reviewed , and  Is up to date    PE; BP 124/82   Pulse 94   Resp 16   Ht 5\' 8"  (1.727 m)   Wt 211 lb (95.7 kg)   SpO2 94%   BMI 32.08 kg/m   Pleasant male, alert and oriented x 3, in no cardio-pulmonary distress. Afebrile. HEENT No facial trauma or asymetry. Sinuses non tender. EOMI, pupils equally reactive to light. External ears normal, tympanic membranes clear. Oropharynx moist, no exudate. Neck: supple, no adenopathy,JVD or thyromegaly.No bruits.  Chest: Clear to ascultation bilaterally.No crackles or wheezes. Non tender to palpation  Breast: No asymetry,no masses. No nipple discharge or inversion. No axillary or supraclavicular adenopathy  Cardiovascular system; Heart sounds normal,  S1 and  S2 ,no S3.  No murmur, or thrill. Apical beat not displaced Peripheral pulses normal.  Abdomen: Soft, non tender, no organomegaly or masses. No bruits. Bowel sounds normal. No guarding, tenderness or rebound.  Rectal:  Normal sphincter tone. No hemorrhoids or  masses. guaiac negative stool. Prostate smooth and firm    Musculoskeletal exam: Full ROM of spine, hips , shoulders and knees. No deformity ,swelling or crepitus noted. No muscle wasting or atrophy.   Neurologic: Cranial nerves 2 to 12 intact. Power, tone ,sensation and reflexes normal throughout. No disturbance in gait. No tremor.  Skin: Intact, no ulceration, erythema , scaling or rash noted. Pigmentation normal throughout  Psych; Flat  mood and affect. Judgement and concentration normal  PHQ  9 score of 18, therapy referral, increased fluoxetine dose , and m note to spouse, not suicidal  Or  homicidal   Assessment & Plan:  Annual physical exam Annual exam as documented. Counseling done  re healthy lifestyle involving commitment to 150 minutes exercise per week, heart healthy diet, and attaining healthy weight.The importance of adequate sleep also discussed. Regular seat belt use and home safety, is also discussed. Changes in health habits are decided on by the patient with goals and time frames  set for achieving them. Immunization and cancer screening needs are specifically addressed at this visit.   Diabetes mellitus type 2 in obese (HCC) Uncontrolled, due to poor eating, will check HBA1c early March Norman Peters is reminded of the importance of commitment to daily physical activity for 30 minutes or more, as able and the need to limit carbohydrate intake to 30 to 60 grams per meal to help with blood sugar control.   The need to take medication as prescribed, test blood sugar as directed, and to call between visits if there is a concern that blood sugar is uncontrolled is also discussed.   Norman Peters is reminded of the importance of daily foot exam, annual eye examination, and good blood sugar, blood pressure and cholesterol control.  Diabetic Labs Latest Ref Rng & Units 08/25/2017 05/25/2017 05/14/2017 02/10/2017 10/06/2016  HbA1c <5.7 % of total Hgb 8.5(H) - 10.1(H) 9.2(H) 7.3(H)  Microalbumin Not Estab. ug/mL - 564.4(H) - - -  Micro/Creat Ratio 0.0 - 30.0 mg/g creat - 353.0(H) - - -  Chol <200 mg/dL - - 99 89 120  HDL >40 mg/dL - - 33(L) 32(L) 36(L)  Calc  LDL <100 mg/dL - - 27 23 48  Triglycerides <150 mg/dL - - 196(H) 169(H) 182(H)  Creatinine 0.70 - 1.25 mg/dL 1.23 - - 1.26(H) 1.27(H)   BP/Weight 11/21/2017 08/25/2017 08/12/2017 07/07/2017 05/25/2017 01/25/2017 40/34/7425  Systolic BP 956 387 564 - 332 951 884  Diastolic BP 82 82 84 - 82 80 80  Wt. (Lbs) 211 208.25 - 202 210.75 211 215  BMI 32.08 31.66 - 30.71 32.04 32.08 32.69   Foot/eye exam completion dates Latest Ref Rng  & Units 05/25/2017 04/21/2016  Eye Exam No Retinopathy - No Retinopathy  Foot Form Completion - Done -        Anxiety and depression Uncontrolled  PHQ 9 score of 18, increase fluopxetine to 30 mg and refer to therapist

## 2017-11-21 NOTE — Assessment & Plan Note (Signed)
Uncontrolled, due to poor eating, will check HBA1c early March Norman Peters is reminded of the importance of commitment to daily physical activity for 30 minutes or more, as able and the need to limit carbohydrate intake to 30 to 60 grams per meal to help with blood sugar control.   The need to take medication as prescribed, test blood sugar as directed, and to call between visits if there is a concern that blood sugar is uncontrolled is also discussed.   Norman Peters is reminded of the importance of daily foot exam, annual eye examination, and good blood sugar, blood pressure and cholesterol control.  Diabetic Labs Latest Ref Rng & Units 08/25/2017 05/25/2017 05/14/2017 02/10/2017 10/06/2016  HbA1c <5.7 % of total Hgb 8.5(H) - 10.1(H) 9.2(H) 7.3(H)  Microalbumin Not Estab. ug/mL - 564.4(H) - - -  Micro/Creat Ratio 0.0 - 30.0 mg/g creat - 353.0(H) - - -  Chol <200 mg/dL - - 99 89 120  HDL >40 mg/dL - - 33(L) 32(L) 36(L)  Calc LDL <100 mg/dL - - 27 23 48  Triglycerides <150 mg/dL - - 196(H) 169(H) 182(H)  Creatinine 0.70 - 1.25 mg/dL 1.23 - - 1.26(H) 1.27(H)   BP/Weight 11/21/2017 08/25/2017 08/12/2017 07/07/2017 05/25/2017 01/25/2017 99/37/1696  Systolic BP 789 381 017 - 510 258 527  Diastolic BP 82 82 84 - 82 80 80  Wt. (Lbs) 211 208.25 - 202 210.75 211 215  BMI 32.08 31.66 - 30.71 32.04 32.08 32.69   Foot/eye exam completion dates Latest Ref Rng & Units 05/25/2017 04/21/2016  Eye Exam No Retinopathy - No Retinopathy  Foot Form Completion - Done -

## 2017-11-22 ENCOUNTER — Telehealth: Payer: Self-pay

## 2017-11-22 NOTE — Telephone Encounter (Signed)
Noted. Will send for results

## 2017-11-22 NOTE — Telephone Encounter (Signed)
Had Appt with EYE Dr 08/03/17.  Pt said Dr Chauncey Cruel needed to know this.

## 2017-12-01 ENCOUNTER — Encounter: Payer: Medicare HMO | Admitting: Family Medicine

## 2018-01-31 DIAGNOSIS — I10 Essential (primary) hypertension: Secondary | ICD-10-CM | POA: Diagnosis not present

## 2018-01-31 DIAGNOSIS — E1169 Type 2 diabetes mellitus with other specified complication: Secondary | ICD-10-CM | POA: Diagnosis not present

## 2018-01-31 DIAGNOSIS — E785 Hyperlipidemia, unspecified: Secondary | ICD-10-CM | POA: Diagnosis not present

## 2018-01-31 DIAGNOSIS — E669 Obesity, unspecified: Secondary | ICD-10-CM | POA: Diagnosis not present

## 2018-02-01 LAB — COMPLETE METABOLIC PANEL WITH GFR
AG RATIO: 1.5 (calc) (ref 1.0–2.5)
ALT: 11 U/L (ref 9–46)
AST: 10 U/L (ref 10–35)
Albumin: 4.3 g/dL (ref 3.6–5.1)
Alkaline phosphatase (APISO): 68 U/L (ref 40–115)
BILIRUBIN TOTAL: 0.6 mg/dL (ref 0.2–1.2)
BUN: 16 mg/dL (ref 7–25)
CHLORIDE: 103 mmol/L (ref 98–110)
CO2: 28 mmol/L (ref 20–32)
Calcium: 9.7 mg/dL (ref 8.6–10.3)
Creat: 1.16 mg/dL (ref 0.70–1.25)
GFR, Est African American: 78 mL/min/{1.73_m2} (ref >=60)
GFR, Est Non African American: 68 mL/min/{1.73_m2} (ref >=60)
Globulin: 2.8 g/dL (calc) (ref 1.9–3.7)
Glucose, Bld: 152 mg/dL — ABNORMAL HIGH (ref 65–99)
POTASSIUM: 4.7 mmol/L (ref 3.5–5.3)
Sodium: 136 mmol/L (ref 135–146)
Total Protein: 7.1 g/dL (ref 6.1–8.1)

## 2018-02-01 LAB — LIPID PANEL
CHOL/HDL RATIO: 2.8 (calc) (ref <5.0)
Cholesterol: 94 mg/dL (ref <200)
HDL: 34 mg/dL — AB (ref >40)
LDL CHOLESTEROL (CALC): 37 mg/dL
NON-HDL CHOLESTEROL (CALC): 60 mg/dL (ref <130)
TRIGLYCERIDES: 151 mg/dL — AB (ref <150)

## 2018-02-01 LAB — HEMOGLOBIN A1C
Hgb A1c MFr Bld: 8.4 % of total Hgb — ABNORMAL HIGH (ref <5.7)
Mean Plasma Glucose: 194 (calc)
eAG (mmol/L): 10.8 (calc)

## 2018-02-01 LAB — TSH: TSH: 1.34 mIU/L (ref 0.40–4.50)

## 2018-02-06 ENCOUNTER — Encounter: Payer: Self-pay | Admitting: Family Medicine

## 2018-02-06 ENCOUNTER — Ambulatory Visit (INDEPENDENT_AMBULATORY_CARE_PROVIDER_SITE_OTHER): Payer: Medicare HMO | Admitting: Family Medicine

## 2018-02-06 VITALS — BP 120/80 | HR 86 | Resp 16 | Ht 68.0 in | Wt 210.0 lb

## 2018-02-06 DIAGNOSIS — F329 Major depressive disorder, single episode, unspecified: Secondary | ICD-10-CM | POA: Diagnosis not present

## 2018-02-06 DIAGNOSIS — E6609 Other obesity due to excess calories: Secondary | ICD-10-CM

## 2018-02-06 DIAGNOSIS — E785 Hyperlipidemia, unspecified: Secondary | ICD-10-CM | POA: Diagnosis not present

## 2018-02-06 DIAGNOSIS — F32A Depression, unspecified: Secondary | ICD-10-CM

## 2018-02-06 DIAGNOSIS — F419 Anxiety disorder, unspecified: Secondary | ICD-10-CM | POA: Diagnosis not present

## 2018-02-06 DIAGNOSIS — I10 Essential (primary) hypertension: Secondary | ICD-10-CM

## 2018-02-06 DIAGNOSIS — Z125 Encounter for screening for malignant neoplasm of prostate: Secondary | ICD-10-CM | POA: Diagnosis not present

## 2018-02-06 DIAGNOSIS — E1169 Type 2 diabetes mellitus with other specified complication: Secondary | ICD-10-CM | POA: Diagnosis not present

## 2018-02-06 DIAGNOSIS — E669 Obesity, unspecified: Secondary | ICD-10-CM

## 2018-02-06 MED ORDER — GLIPIZIDE 10 MG PO TABS: 10.0000 mg | ORAL_TABLET | Freq: Every day | ORAL | 3 refills | Status: DC

## 2018-02-06 MED ORDER — PRAVASTATIN SODIUM 10 MG PO TABS: 10.0000 mg | ORAL_TABLET | Freq: Every day | ORAL | 3 refills | Status: DC

## 2018-02-06 MED ORDER — GLIPIZIDE 5 MG PO TABS: ORAL_TABLET | ORAL | 3 refills | Status: DC

## 2018-02-06 NOTE — Patient Instructions (Addendum)
Wellness with nurse past due, please schedule   MD follow up  In 3.5 month  Non fasting HBA1C, chem 7 and  eGFR and PSA 1 week before MD visit  Change in dose of  Glipizide , reduce evening glipizide to 5 mg tablet 1 every evening, continue 10 mg tablet  One every morning, and metformin as before  Dose of cholesterol, medication is reduced when you next get it  May test twice  Daily, fasting and bedtime  Goal for fasting blood sugar ranges from 90 to 130 and 2 hours after any meal or at bedtime should be between 140 to 170.   It is important that you exercise regularly at least 30 minutes 5 times a week. If you develop chest pain, have severe difficulty breathing, or feel very tired, stop exercising immediately and seek medical attention   Continue to control portions for weight loss  Continue  Conversations and positive interactions, help is also a very god thing when we need it

## 2018-02-06 NOTE — Progress Notes (Signed)
Norman Peters     MRN: [id number]      DOB: 1956/06/05   HPI [name] is here for follow up and re-evaluation of chronic medical conditions, medication management and review of any available recent lab and radiology data.  Preventive health is updated, specifically  Cancer screening and Immunization.   Questions or concerns regarding consultations or procedures which the PT has had in the interim are  addressed. The PT denies any adverse reactions to current medications since the last visit.  PTc/o low blood sugar some mornings in February , therefore does not want to continue high dose of glipizide  ROS Denies recent fever or chills. Denies sinus pressure, nasal congestion, ear pain or sore throat. Denies chest congestion, productive cough or wheezing. Denies chest pains, palpitations and leg swelling Denies abdominal pain, nausea, vomiting,diarrhea or constipation.   Denies dysuria, frequency, hesitancy or incontinence. Denies joint pain, swelling and limitation in mobility. Denies headaches, seizures, numbness, or tingling. Denies uncontrolled  depression, anxiety or insomnia.Starting to be less withdrawn from the community and gets out more now. Reports self consciousness about tardive dyskinesia, which is barely visible and which he should not have as he is not on medication to cause this Denies skin break down or rash.   PE  BP 140/80   Pulse 86   Resp 16   Ht '5\' 8"'  (1.727 m)   Wt 210 lb (95.3 kg)   SpO2 97%   BMI 31.93 kg/m   Patient alert and oriented and in no cardiopulmonary distress.  HEENT: No facial asymmetry, EOMI,   oropharynx pink and moist.  Neck supple no JVD, no mass.  Chest: Clear to auscultation bilaterally.  CVS: S1, S2 no murmurs, no S3.Regular rate.  ABD: Soft non tender.   Ext: No edema  MS: Adequate ROM spine, shoulders, hips and knees.  Skin: Intact, no ulcerations or rash noted.  Psych: Good eye contact, normal affect. Memory intact not  anxious or depressed appearing.  CNS: CN 2-12 intact, power,  normal throughout.no focal deficits noted.   Assessment & Plan  Hypertension goal BP (blood pressure) < 130/80 Controlled, no change in medication DASH diet and commitment to daily physical activity for a minimum of 30 minutes discussed and encouraged, as a part of hypertension management. The importance of attaining a healthy weight is also discussed.  BP/Weight 02/06/2018 11/21/2017 08/25/2017 08/12/2017 07/07/2017 05/25/2017 12/20/9415  Systolic BP 408 144 818 563 - 149 702  Diastolic BP 80 82 82 84 - 82 80  Wt. (Lbs) 210 211 208.25 - 202 210.75 211  BMI 31.93 32.08 31.66 - 30.71 32.04 32.08       Hyperlipidemia LDL goal <100 Hyperlipidemia:Low fat diet discussed and encouraged.   Lipid Panel  Lab Results  Component Value Date   CHOL 94 01/31/2018   HDL 34 (L) 01/31/2018   LDLCALC 37 01/31/2018   TRIG 151 (H) 01/31/2018   CHOLHDL 2.8 01/31/2018    Needs to reduce fat and increase exercise, not at goal, crestor dose is reduced   Obesity Deteriorated. Patient re-educated about  the importance of commitment to a  minimum of 150 minutes of exercise per week.  The importance of healthy food choices with portion control discussed. Encouraged to start a food diary, count calories and to consider  joining a support group. Sample diet sheets offered. Goals set by the patient for the next several months.   Weight /BMI 02/06/2018 11/21/2017 08/25/2017  WEIGHT 210 lb 211  lb 208 lb 4 oz  HEIGHT '5\' 8"'  '5\' 8"'  '5\' 8"'   BMI 31.93 kg/m2 32.08 kg/m2 31.66 kg/m2      Diabetes mellitus type 2 in obese (HCC)  Controlled but need to reduce dose of glipizide as describes hypoglycemia. ' [name] is reminded of the importance of commitment to daily physical activity for 30 minutes or more, as able and the need to limit carbohydrate intake to 30 to 60 grams per meal to help with blood sugar control.   The need to take medication  as prescribed, test blood sugar as directed, and to call between visits if there is a concern that blood sugar is uncontrolled is also discussed.   [name] is reminded of the importance of daily foot exam, annual eye examination, and good blood sugar, blood pressure and cholesterol control.  Diabetic Labs Latest Ref Rng & Units 01/31/2018 08/25/2017 05/25/2017 05/14/2017 02/10/2017  HbA1c <5.7 % of total Hgb 8.4(H) 8.5(H) - 10.1(H) 9.2(H)  Microalbumin Not Estab. ug/mL - - 564.4(H) - -  Micro/Creat Ratio 0.0 - 30.0 mg/g creat - - 353.0(H) - -  Chol <200 mg/dL 94 - - 99 89  HDL >40 mg/dL 34(L) - - 33(L) 32(L)  Calc LDL mg/dL (calc) 37 - - 27 23  Triglycerides <150 mg/dL 151(H) - - 196(H) 169(H)  Creatinine 0.70 - 1.25 mg/dL 1.16 1.23 - - 1.26(H)   BP/Weight 02/06/2018 11/21/2017 08/25/2017 08/12/2017 07/07/2017 05/25/2017 3/61/4431  Systolic BP 540 086 761 950 - 932 671  Diastolic BP 80 82 82 84 - 82 80  Wt. (Lbs) 210 211 208.25 - 202 210.75 211  BMI 31.93 32.08 31.66 - 30.71 32.04 32.08   Foot/eye exam completion dates Latest Ref Rng & Units 05/25/2017 04/21/2016  Eye Exam No Retinopathy - No Retinopathy  Foot Form Completion - Done -        Anxiety and depression Controlled on medication. Would benefit from psychotherapy but not interested

## 2018-02-10 ENCOUNTER — Encounter: Payer: Self-pay | Admitting: Family Medicine

## 2018-02-10 NOTE — Assessment & Plan Note (Addendum)
Hyperlipidemia:Low fat diet discussed and encouraged.   Lipid Panel  Lab Results  Component Value Date   CHOL 94 01/31/2018   HDL 34 (L) 01/31/2018   LDLCALC 37 01/31/2018   TRIG 151 (H) 01/31/2018   CHOLHDL 2.8 01/31/2018    Needs to reduce fat and increase exercise, not at goal, crestor dose is reduced

## 2018-02-10 NOTE — Assessment & Plan Note (Signed)
Controlled on medication. Would benefit from psychotherapy but not interested

## 2018-02-10 NOTE — Assessment & Plan Note (Signed)
Controlled, no change in medication DASH diet and commitment to daily physical activity for a minimum of 30 minutes discussed and encouraged, as a part of hypertension management. The importance of attaining a healthy weight is also discussed.  BP/Weight 02/06/2018 11/21/2017 08/25/2017 08/12/2017 07/07/2017 05/25/2017 0/35/2481  Systolic BP 859 093 112 162 - 446 950  Diastolic BP 80 82 82 84 - 82 80  Wt. (Lbs) 210 211 208.25 - 202 210.75 211  BMI 31.93 32.08 31.66 - 30.71 32.04 32.08

## 2018-02-10 NOTE — Assessment & Plan Note (Signed)
Deteriorated. Patient re-educated about  the importance of commitment to a  minimum of 150 minutes of exercise per week.  The importance of healthy food choices with portion control discussed. Encouraged to start a food diary, count calories and to consider  joining a support group. Sample diet sheets offered. Goals set by the patient for the next several months.   Weight /BMI 02/06/2018 11/21/2017 08/25/2017  WEIGHT 210 lb 211 lb 208 lb 4 oz  HEIGHT 5\' 8"  5\' 8"  5\' 8"   BMI 31.93 kg/m2 32.08 kg/m2 31.66 kg/m2

## 2018-02-10 NOTE — Assessment & Plan Note (Signed)
Controlled but need to reduce dose of glipizide as describes hypoglycemia. ' [name] is reminded of the importance of commitment to daily physical activity for 30 minutes or more, as able and the need to limit carbohydrate intake to 30 to 60 grams per meal to help with blood sugar control.   The need to take medication as prescribed, test blood sugar as directed, and to call between visits if there is a concern that blood sugar is uncontrolled is also discussed.   [name] is reminded of the importance of daily foot exam, annual eye examination, and good blood sugar, blood pressure and cholesterol control.  Diabetic Labs Latest Ref Rng & Units 01/31/2018 08/25/2017 05/25/2017 05/14/2017 02/10/2017  HbA1c <5.7 % of total Hgb 8.4(H) 8.5(H) - 10.1(H) 9.2(H)  Microalbumin Not Estab. ug/mL - - 564.4(H) - -  Micro/Creat Ratio 0.0 - 30.0 mg/g creat - - 353.0(H) - -  Chol <200 mg/dL 94 - - 99 89  HDL >40 mg/dL 34(L) - - 33(L) 32(L)  Calc LDL mg/dL (calc) 37 - - 27 23  Triglycerides <150 mg/dL 151(H) - - 196(H) 169(H)  Creatinine 0.70 - 1.25 mg/dL 1.16 1.23 - - 1.26(H)   BP/Weight 02/06/2018 11/21/2017 08/25/2017 08/12/2017 07/07/2017 05/25/2017 11/22/7260  Systolic BP 035 597 416 384 - 536 468  Diastolic BP 80 82 82 84 - 82 80  Wt. (Lbs) 210 211 208.25 - 202 210.75 211  BMI 31.93 32.08 31.66 - 30.71 32.04 32.08   Foot/eye exam completion dates Latest Ref Rng & Units 05/25/2017 04/21/2016  Eye Exam No Retinopathy - No Retinopathy  Foot Form Completion - Done -

## 2018-03-07 ENCOUNTER — Other Ambulatory Visit: Payer: Self-pay | Admitting: Family Medicine

## 2018-03-07 DIAGNOSIS — I10 Essential (primary) hypertension: Secondary | ICD-10-CM

## 2018-03-07 DIAGNOSIS — F419 Anxiety disorder, unspecified: Principal | ICD-10-CM

## 2018-03-07 DIAGNOSIS — F329 Major depressive disorder, single episode, unspecified: Secondary | ICD-10-CM

## 2018-03-10 ENCOUNTER — Ambulatory Visit: Payer: Medicare HMO

## 2018-03-24 ENCOUNTER — Ambulatory Visit (INDEPENDENT_AMBULATORY_CARE_PROVIDER_SITE_OTHER): Payer: Medicare HMO

## 2018-03-24 VITALS — BP 132/82 | HR 86 | Resp 16 | Ht 68.0 in | Wt 213.0 lb

## 2018-03-24 DIAGNOSIS — Z0000 Encounter for general adult medical examination without abnormal findings: Secondary | ICD-10-CM | POA: Diagnosis not present

## 2018-03-24 NOTE — Progress Notes (Signed)
Subjective:   Crawford Tamura is a 62 y.o. male who presents for Medicare Annual/Subsequent preventive examination.  Review of Systems:   Cardiac Risk Factors include: advanced age (>29men, >89 women);diabetes mellitus;dyslipidemia;male gender;hypertension;obesity (BMI >30kg/m2);smoking/ tobacco exposure     Objective:    Vitals: BP 132/82   Pulse 86   Resp 16   Ht 5\' 8"  (1.727 m)   Wt 213 lb (96.6 kg)   SpO2 95%   BMI 32.39 kg/m   Body mass index is 32.39 kg/m.  Advanced Directives 07/07/2017 05/25/2017 07/07/2016  Does Patient Have a Medical Advance Directive? No No No  Would patient like information on creating a medical advance directive? - Yes (Inpatient - patient defers creating a medical advance directive at this time) No - patient declined information    Tobacco Social History   Tobacco Use  Smoking Status Former Smoker  . Last attempt to quit: 01/20/2011  . Years since quitting: 7.1  Smokeless Tobacco Never Used     Counseling given: Not Answered   Clinical Intake:  Pre-visit preparation completed: Yes  Pain : 0-10 Pain Score: 5  Pain Type: Chronic pain Pain Location: Knee Pain Orientation: Right, Left Pain Onset: More than a month ago Pain Frequency: Intermittent Effect of Pain on Daily Activities: tries to exercise on treadmill but seems to make pain worse     Nutritional Status: BMI > 30  Obese Diabetes: Yes CBG done?: No Did pt. bring in CBG monitor from home?: No  How often do you need to have someone help you when you read instructions, pamphlets, or other written materials from your doctor or pharmacy?: 2 - Rarely What is the last grade level you completed in school?: 10th grade   Interpreter Needed?: No  Information entered by :: brandi hudy LPN   Past Medical History:  Diagnosis Date  . Anxiety   . Arthritis   . Depression 2001  . Diabetes mellitus 2010  . GERD (gastroesophageal reflux disease)   . Hyperlipidemia   . Hypertension  2005   Past Surgical History:  Procedure Laterality Date  . COLONOSCOPY N/A 07/07/2016   Procedure: COLONOSCOPY;  Surgeon: Rogene Houston, MD;  Location: [facility];  Service: Endoscopy;  Laterality: N/A;  1030  . FINGER SURGERY Left 1974   fifth , trauma on job, had to replace/repair finger  . HERNIA REPAIR  8891   x3, umbilical and bilateral hernia   Family History  Problem Relation Age of Onset  . Stroke Mother    Social History   Socioeconomic History  . Marital status: Married    Spouse name: Not on file  . Number of children: Not on file  . Years of education: Not on file  . Highest education level: Not on file  Occupational History  . Not on file  Social Needs  . Financial resource strain: Not on file  . Food insecurity:    Worry: Not on file    Inability: Not on file  . Transportation needs:    Medical: Not on file    Non-medical: Not on file  Tobacco Use  . Smoking status: Former Smoker    Last attempt to quit: 01/20/2011    Years since quitting: 7.1  . Smokeless tobacco: Never Used  Substance and Sexual Activity  . Alcohol use: No  . Drug use: No  . Sexual activity: Not Currently  Lifestyle  . Physical activity:    Days per week: Not on file  Minutes per session: Not on file  . Stress: Not on file  Relationships  . Social connections:    Talks on phone: Not on file    Gets together: Not on file    Attends religious service: Not on file    Active member of club or organization: Not on file    Attends meetings of clubs or organizations: Not on file    Relationship status: Not on file  Other Topics Concern  . Not on file  Social History Narrative  . Not on file    Outpatient Encounter Medications as of 03/24/2018  Medication Sig  . amLODipine (NORVASC) 10 MG tablet TAKE 1 TABLET EVERY DAY  . aspirin EC 81 MG tablet Take 81 mg by mouth daily.  . busPIRone (BUSPAR) 5 MG tablet TAKE 1 TABLET TWICE DAILY  . Cholecalciferol (VITAMIN D3) 400 units  CAPS Take 2 capsules by mouth daily.  Marland Kitchen FLUoxetine (PROZAC) 10 MG capsule Take 1 capsule (10 mg total) by mouth daily.  Marland Kitchen FLUoxetine (PROZAC) 20 MG capsule Take 1 capsule (20 mg total) by mouth daily.  Marland Kitchen glipiZIDE (GLUCOTROL) 10 MG tablet Take 1 tablet (10 mg total) by mouth daily before breakfast.  . glipiZIDE (GLUCOTROL) 5 MG tablet One tablet every evening with dinner  . metFORMIN (GLUCOPHAGE) 1000 MG tablet TAKE 1 TABLET TWICE DAILY WITH A MEAL  . pravastatin (PRAVACHOL) 10 MG tablet Take 1 tablet (10 mg total) by mouth daily.  Marland Kitchen spironolactone (ALDACTONE) 50 MG tablet TAKE 1 TABLET EVERY DAY  . TRUE METRIX BLOOD GLUCOSE TEST test strip USE  TO  TEST ONE TIME DAILY  . TRUEPLUS LANCETS 28G MISC USE  TO  TEST ONE TIME DAILY   No facility-administered encounter medications on file as of 03/24/2018.     Activities of Daily Living In your present state of health, do you have any difficulty performing the following activities: 03/24/2018  Hearing? N  Vision? N  Difficulty concentrating or making decisions? N  Walking or climbing stairs? Y  Dressing or bathing? N  Doing errands, shopping? N  Preparing Food and eating ? N  Using the Toilet? N  In the past six months, have you accidently leaked urine? N  Do you have problems with loss of bowel control? N  Managing your Medications? N  Managing your Finances? N  Housekeeping or managing your Housekeeping? N  Some recent data might be hidden    Patient Care Team: Fayrene Helper, MD as PCP - General (Family Medicine)   Assessment:   This is a routine wellness examination for Isiaah.  Exercise Activities and Dietary recommendations Current Exercise Habits: Home exercise routine, Type of exercise: treadmill, Time (Minutes): 20, Frequency (Times/Week): 3(limited from knee pain ), Weekly Exercise (Minutes/Week): 60, Intensity: Moderate  Goals    None      Fall Risk Fall Risk  02/06/2018 07/07/2017 05/25/2017 01/25/2017  Falls in the  past year? Yes No No Yes  Number falls in past yr: 1 - - 2 or more  Injury with Fall? - - - No   Is the patient's home free of loose throw rugs in walkways, pet beds, electrical cords, etc?   yes      Grab bars in the bathroom? yes      Handrails on the stairs?   yes      Adequate lighting?   yes    Depression Screen PHQ 2/9 Scores 11/21/2017 07/07/2017 05/25/2017 01/25/2017  PHQ - 2  Score 6 0 2 2  PHQ- 9 Score 18 - 7 4    Cognitive Function        Immunization History  Administered Date(s) Administered  . Influenza,inj,Quad PF,6+ Mos 07/14/2016, 08/25/2017  . Pneumococcal Polysaccharide-23 02/10/2016    Qualifies for Shingles Vaccine?  Check insurance   Screening Tests Health Maintenance  Topic Date Due  . URINE MICROALBUMIN  05/25/2018  . FOOT EXAM  05/29/2018  . INFLUENZA VACCINE  06/08/2018  . HEMOGLOBIN A1C  08/03/2018  . OPHTHALMOLOGY EXAM  08/17/2018  . PNEUMOCOCCAL POLYSACCHARIDE VACCINE (2) 02/09/2021  . TETANUS/TDAP  01/24/2024  . COLONOSCOPY  07/07/2026  . Hepatitis C Screening  Completed  . HIV Screening  Completed   Cancer Screenings: Lung: Low Dose CT Chest recommended if Age 65-80 years, 30 pack-year currently smoking OR have quit w/in 15years. Patient does qualify. Colorectal: due 2027     Plan:     I have personally reviewed and noted the following in the patient's chart:   . Medical and social history . Use of alcohol, tobacco or illicit drugs  . Current medications and supplements . Functional ability and status . Nutritional status . Physical activity . Advanced directives . List of other physicians . Hospitalizations, surgeries, and ER visits in previous 12 months . Vitals . Screenings to include cognitive, depression, and falls . Referrals and appointments  In addition, I have reviewed and discussed with patient certain preventive protocols, quality metrics, and best practice recommendations. A written personalized care plan for  preventive services as well as general preventive health recommendations were provided to patient.     Kate Sable, LPN, LPN  12/16/221

## 2018-03-24 NOTE — Patient Instructions (Signed)
[name] , Thank you for taking time to come for your Medicare Wellness Visit. I appreciate your ongoing commitment to your health goals. Please review the following plan we discussed and let me know if I can assist you in the future.   Screening recommendations/referrals: Colonoscopy: Due 2027 Recommended yearly ophthalmology/optometry visit for glaucoma screening and checkup Recommended yearly dental visit for hygiene and checkup  Vaccinations: Influenza vaccine: Done  Pneumococcal vaccine: Done  Tdap vaccine: Done Shingles vaccine: ask insurance   Advanced directives: yes, form given  Conditions/risks identified: done  Next appointment: 05/22/2018  Preventive Care 40-64 Years, Male Preventive care refers to lifestyle choices and visits with your health care provider that can promote health and wellness. What does preventive care include?  A yearly physical exam. This is also called an annual well check.  Dental exams once or twice a year.  Routine eye exams. Ask your health care provider how often you should have your eyes checked.  Personal lifestyle choices, including:  Daily care of your teeth and gums.  Regular physical activity.  Eating a healthy diet.  Avoiding tobacco and drug use.  Limiting alcohol use.  Practicing safe sex.  Taking low-dose aspirin every day starting at age 21. What happens during an annual well check? The services and screenings done by your health care provider during your annual well check will depend on your age, overall health, lifestyle risk factors, and family history of disease. Counseling  Your health care provider may ask you questions about your:  Alcohol use.  Tobacco use.  Drug use.  Emotional well-being.  Home and relationship well-being.  Sexual activity.  Eating habits.  Work and work Statistician. Screening  You may have the following tests or measurements:  Height, weight, and BMI.  Blood  pressure.  Lipid and cholesterol levels. These may be checked every 5 years, or more frequently if you are over 44 years old.  Skin check.  Lung cancer screening. You may have this screening every year starting at age 87 if you have a 30-pack-year history of smoking and currently smoke or have quit within the past 15 years.  Fecal occult blood test (FOBT) of the stool. You may have this test every year starting at age 47.  Flexible sigmoidoscopy or colonoscopy. You may have a sigmoidoscopy every 5 years or a colonoscopy every 10 years starting at age 68.  Prostate cancer screening. Recommendations will vary depending on your family history and other risks.  Hepatitis C blood test.  Hepatitis B blood test.  Sexually transmitted disease (STD) testing.  Diabetes screening. This is done by checking your blood sugar (glucose) after you have not eaten for a while (fasting). You may have this done every 1-3 years. Discuss your test results, treatment options, and if necessary, the need for more tests with your health care provider. Vaccines  Your health care provider may recommend certain vaccines, such as:  Influenza vaccine. This is recommended every year.  Tetanus, diphtheria, and acellular pertussis (Tdap, Td) vaccine. You may need a Td booster every 10 years.  Zoster vaccine. You may need this after age 64.  Pneumococcal 13-valent conjugate (PCV13) vaccine. You may need this if you have certain conditions and have not been vaccinated.  Pneumococcal polysaccharide (PPSV23) vaccine. You may need one or two doses if you smoke cigarettes or if you have certain conditions. Talk to your health care provider about which screenings and vaccines you need and how often you need them. This  information is not intended to replace advice given to you by your health care provider. Make sure you discuss any questions you have with your health care provider. Document Released: 11/21/2015 Document  Revised: 07/14/2016 Document Reviewed: 08/26/2015 Elsevier Interactive Patient Education  2017 [place] Prevention in the Home Falls can cause injuries. They can happen to people of all ages. There are many things you can do to make your home safe and to help prevent falls. What can I do on the outside of my home?  Regularly fix the edges of walkways and driveways and fix any cracks.  Remove anything that might make you trip as you walk through a door, such as a raised step or threshold.  Trim any bushes or trees on the path to your home.  Use bright outdoor lighting.  Clear any walking paths of anything that might make someone trip, such as rocks or tools.  Regularly check to see if handrails are loose or broken. Make sure that both sides of any steps have handrails.  Any raised decks and porches should have guardrails on the edges.  Have any leaves, snow, or ice cleared regularly.  Use sand or salt on walking paths during winter.  Clean up any spills in your garage right away. This includes oil or grease spills. What can I do in the bathroom?  Use night lights.  Install grab bars by the toilet and in the tub and shower. Do not use towel bars as grab bars.  Use non-skid mats or decals in the tub or shower.  If you need to sit down in the shower, use a plastic, non-slip stool.  Keep the floor dry. Clean up any water that spills on the floor as soon as it happens.  Remove soap buildup in the tub or shower regularly.  Attach bath mats securely with double-sided non-slip rug tape.  Do not have throw rugs and other things on the floor that can make you trip. What can I do in the bedroom?  Use night lights.  Make sure that you have a light by your bed that is easy to reach.  Do not use any sheets or blankets that are too big for your bed. They should not hang down onto the floor.  Have a firm chair that has side arms. You can use this for support while you  get dressed.  Do not have throw rugs and other things on the floor that can make you trip. What can I do in the kitchen?  Clean up any spills right away.  Avoid walking on wet floors.  Keep items that you use a lot in easy-to-reach places.  If you need to reach something above you, use a strong step stool that has a grab bar.  Keep electrical cords out of the way.  Do not use floor polish or wax that makes floors slippery. If you must use wax, use non-skid floor wax.  Do not have throw rugs and other things on the floor that can make you trip. What can I do with my stairs?  Do not leave any items on the stairs.  Make sure that there are handrails on both sides of the stairs and use them. Fix handrails that are broken or loose. Make sure that handrails are as long as the stairways.  Check any carpeting to make sure that it is firmly attached to the stairs. Fix any carpet that is loose or worn.  Avoid having throw  rugs at the top or bottom of the stairs. If you do have throw rugs, attach them to the floor with carpet tape.  Make sure that you have a light switch at the top of the stairs and the bottom of the stairs. If you do not have them, ask someone to add them for you. What else can I do to help prevent falls?  Wear shoes that:  Do not have high heels.  Have rubber bottoms.  Are comfortable and fit you well.  Are closed at the toe. Do not wear sandals.  If you use a stepladder:  Make sure that it is fully opened. Do not climb a closed stepladder.  Make sure that both sides of the stepladder are locked into place.  Ask someone to hold it for you, if possible.  Clearly mark and make sure that you can see:  Any grab bars or handrails.  First and last steps.  Where the edge of each step is.  Use tools that help you move around (mobility aids) if they are needed. These include:  Canes.  Walkers.  Scooters.  Crutches.  Turn on the lights when you go into  a dark area. Replace any light bulbs as soon as they burn out.  Set up your furniture so you have a clear path. Avoid moving your furniture around.  If any of your floors are uneven, fix them.  If there are any pets around you, be aware of where they are.  Review your medicines with your doctor. Some medicines can make you feel dizzy. This can increase your chance of falling. Ask your doctor what other things that you can do to help prevent falls. This information is not intended to replace advice given to you by your health care provider. Make sure you discuss any questions you have with your health care provider. Document Released: 08/21/2009 Document Revised: 04/01/2016 Document Reviewed: 11/29/2014 Elsevier Interactive Patient Education  2017 [employer].

## 2018-03-27 ENCOUNTER — Telehealth: Payer: Self-pay | Admitting: Family Medicine

## 2018-03-27 ENCOUNTER — Other Ambulatory Visit: Payer: Self-pay | Admitting: Family Medicine

## 2018-03-27 MED ORDER — CEPHALEXIN 500 MG PO CAPS: 500.0000 mg | ORAL_CAPSULE | Freq: Three times a day (TID) | ORAL | 0 refills | Status: DC

## 2018-03-27 NOTE — Telephone Encounter (Signed)
I spoke to the pt he is aware that keflex is sent to the pharmacy, plans to see the dentist

## 2018-03-27 NOTE — Telephone Encounter (Signed)
Pt was here Friday for AWV and complained of bad pain in hs teeth. Stated he is needing to get them pulled but he can't get to his dentist in [place] right now and will try as soon as possible but is asking that you send antibiotic until he is able to do this

## 2018-03-27 NOTE — Telephone Encounter (Signed)
Patient is requesting antibiotic for tooth pain.  He said you have giving him this in the past.

## 2018-04-11 ENCOUNTER — Other Ambulatory Visit: Payer: Self-pay | Admitting: Family Medicine

## 2018-04-20 ENCOUNTER — Telehealth: Payer: Self-pay | Admitting: Family Medicine

## 2018-04-20 NOTE — Telephone Encounter (Signed)
Patient requesting refill for cephalexin 500mg  for severe tooth pain  [phone] Send to [facility] in [place] I also gave him the name/# to my dentist.

## 2018-04-21 ENCOUNTER — Other Ambulatory Visit: Payer: Self-pay | Admitting: Family Medicine

## 2018-04-21 MED ORDER — CEPHALEXIN 500 MG PO CAPS: 500.0000 mg | ORAL_CAPSULE | Freq: Three times a day (TID) | ORAL | 0 refills | Status: DC

## 2018-04-21 NOTE — Telephone Encounter (Signed)
Script sent  

## 2018-04-27 ENCOUNTER — Telehealth: Payer: Self-pay | Admitting: Family Medicine

## 2018-04-27 NOTE — Telephone Encounter (Signed)
[facility], he is scheduled 05/04/18 at 7:30am. He wants you to know they changed his antibiotic to clindamycin 300mg 

## 2018-04-28 NOTE — Telephone Encounter (Signed)
noted 

## 2018-05-22 ENCOUNTER — Ambulatory Visit (INDEPENDENT_AMBULATORY_CARE_PROVIDER_SITE_OTHER): Payer: Medicare HMO | Admitting: Family Medicine

## 2018-05-22 ENCOUNTER — Encounter: Payer: Self-pay | Admitting: Family Medicine

## 2018-05-22 ENCOUNTER — Other Ambulatory Visit: Payer: Self-pay | Admitting: Family Medicine

## 2018-05-22 ENCOUNTER — Other Ambulatory Visit: Payer: Self-pay

## 2018-05-22 VITALS — BP 124/80 | HR 84 | Resp 12 | Ht 68.0 in | Wt 204.0 lb

## 2018-05-22 DIAGNOSIS — E785 Hyperlipidemia, unspecified: Secondary | ICD-10-CM

## 2018-05-22 DIAGNOSIS — I10 Essential (primary) hypertension: Secondary | ICD-10-CM | POA: Diagnosis not present

## 2018-05-22 DIAGNOSIS — F419 Anxiety disorder, unspecified: Secondary | ICD-10-CM | POA: Diagnosis not present

## 2018-05-22 DIAGNOSIS — F329 Major depressive disorder, single episode, unspecified: Secondary | ICD-10-CM | POA: Diagnosis not present

## 2018-05-22 DIAGNOSIS — E1169 Type 2 diabetes mellitus with other specified complication: Secondary | ICD-10-CM | POA: Diagnosis not present

## 2018-05-22 DIAGNOSIS — E669 Obesity, unspecified: Secondary | ICD-10-CM | POA: Diagnosis not present

## 2018-05-22 DIAGNOSIS — E6609 Other obesity due to excess calories: Secondary | ICD-10-CM | POA: Diagnosis not present

## 2018-05-22 DIAGNOSIS — Z125 Encounter for screening for malignant neoplasm of prostate: Secondary | ICD-10-CM | POA: Diagnosis not present

## 2018-05-22 NOTE — Patient Instructions (Addendum)
F/u with MD in 4 months, call if you need me sooner, flu vaccine at that visit  Please get labs when you leave here today,   Please get Fasting labs 1 week before next visit, you will get that sheet today,  keep it for next visit, lipid, cmp and EGFr, and hBA1C, microalb,CBC  Resume exercise.. Ear  Exam  Is norma;l, no abnormal sound s on neck exam  Foot pain and heel pain  Is likely plantar fascitis  And you may heel spur,   Best exercise for you is while sitting because of knees, eg bike riding  Congrats on 8 pound weight loss   Bedtime sugar 130 to 180  Fasting / before any meal 90 to 130

## 2018-05-22 NOTE — Progress Notes (Signed)
Norman Peters     MRN: [id number]      DOB: 07-Jul-1956   HPI Norman Peters is here for follow up and re-evaluation of chronic medical conditions, medication management and review of any available recent lab and radiology data.  Preventive health is updated, specifically  Cancer screening and Immunization.   Questions or concerns regarding consultations or procedures which the PT has had in the interim are  addressed. The PT denies any adverse reactions to current medications since the last visit.  c/o bilateral foot pain esp on the right heel after doing yard work , and after sitting for a while Started 3 weeks ago C/o hearing his heart beating in the left ear x  3 weeks at night   Blood sugars improved. Walking less often Checking blood sugars up to 6 times , has had lows down to  62  ROS Denies recent fever or chills. Denies sinus pressure, nasal congestion, ear pain or sore throat. Denies chest congestion, productive cough or wheezing. Denies chest pains, palpitations and leg swelling Denies abdominal pain, nausea, vomiting,diarrhea or constipation.   Denies dysuria, frequency, hesitancy or incontinence. Denies joint pain, swelling and limitation in mobility. Denies headaches, seizures, numbness, or tingling. Denies uncontrolled  depression, anxiety or insomnia. Denies skin break down or rash.   PE  BP 124/80 (BP Location: Left Arm, Patient Position: Sitting, Cuff Size: Large)   Pulse 84   Ht 5\' 8"  (1.727 m)   Wt 204 lb (92.5 kg)   SpO2 95%   BMI 31.02 kg/m   Patient alert and oriented and in no cardiopulmonary distress.  HEENT: No facial asymmetry, EOMI,   oropharynx pink and moist.  Neck supple no JVD, no mass.  Chest: Clear to auscultation bilaterally.  CVS: S1, S2 no murmurs, no S3.Regular rate.  ABD: Soft non tender.   Ext: No edema  MS: Adequate ROM spine, shoulders, hips and knees.  Skin: Intact, no ulcerations or rash noted.  Psych: Good eye contact,  normal affect. Memory intact not anxious or depressed appearing.  CNS: CN 2-12 intact, power,  normal throughout.no focal deficits noted.   Assessment & Plan  Hypertension goal BP (blood pressure) < 130/80 Controlled, no change in medication DASH diet and commitment to daily physical activity for a minimum of 30 minutes discussed and encouraged, as a part of hypertension management. The importance of attaining a healthy weight is also discussed.  BP/Weight 05/22/2018 03/24/2018 02/06/2018 11/21/2017 08/25/2017 08/12/2017 07/25/9149  Systolic BP 569 794 801 655 374 827 -  Diastolic BP 80 82 80 82 82 84 -  Wt. (Lbs) 204 213 210 211 208.25 - 202  BMI 31.02 32.39 31.93 32.08 31.66 - 30.71       Diabetes mellitus type 2 in obese [facility]) Norman Peters is reminded of the importance of commitment to daily physical activity for 30 minutes or more, as able and the need to limit carbohydrate intake to 30 to 60 grams per meal to help with blood sugar control.   The need to take medication as prescribed, test blood sugar as directed, and to call between visits if there is a concern that blood sugar is uncontrolled is also discussed.   Norman Peters is reminded of the importance of daily foot exam, annual eye examination, and good blood sugar, blood pressure and cholesterol control. Uncontrolled ands deteriorated despite pt report of improved blood sugar, refer back to educator, uncertain if he is able to use his meter correctly also  Diabetic Labs Latest Ref Rng & Units 05/22/2018 01/31/2018 08/25/2017 05/25/2017 05/14/2017  HbA1c <5.7 % of total Hgb 8.7(H) 8.4(H) 8.5(H) - 10.1(H)  Microalbumin Not Estab. ug/mL - - - 564.4(H) -  Micro/Creat Ratio 0.0 - 30.0 mg/g creat - - - 353.0(H) -  Chol <200 mg/dL - 94 - - 99  HDL >40 mg/dL - 34(L) - - 33(L)  Calc LDL mg/dL (calc) - 37 - - 27  Triglycerides <150 mg/dL - 151(H) - - 196(H)  Creatinine 0.70 - 1.25 mg/dL 1.27(H) 1.16 1.23 - -   BP/Weight 05/22/2018 03/24/2018  02/06/2018 11/21/2017 08/25/2017 08/12/2017 07/10/4096  Systolic BP 353 299 242 683 419 622 -  Diastolic BP 80 82 80 82 82 84 -  Wt. (Lbs) 204 213 210 211 208.25 - 202  BMI 31.02 32.39 31.93 32.08 31.66 - 30.71   Foot/eye exam completion dates Latest Ref Rng & Units 05/22/2018 05/25/2017  Eye Exam No Retinopathy - -  Foot Form Completion - Done Done        Obesity Improved, he is applauded on this Patient re-educated about  the importance of commitment to a  minimum of 150 minutes of exercise per week.  The importance of healthy food choices with portion control discussed. Encouraged to start a food diary, count calories and to consider  joining a support group. Sample diet sheets offered. Goals set by the patient for the next several months.   Weight /BMI 05/22/2018 03/24/2018 02/06/2018  WEIGHT 204 lb 213 lb 210 lb  HEIGHT 5\' 8"  5\' 8"  5\' 8"   BMI 31.02 kg/m2 32.39 kg/m2 31.93 kg/m2      Hyperlipidemia LDL goal <100 Hyperlipidemia:Low fat diet discussed and encouraged.   Lipid Panel  Lab Results  Component Value Date   CHOL 94 01/31/2018   HDL 34 (L) 01/31/2018   LDLCALC 37 01/31/2018   TRIG 151 (H) 01/31/2018   CHOLHDL 2.8 01/31/2018   Updated lab needed at/ before next visit.     Anxiety and depression Improved and controlled currently

## 2018-05-23 LAB — BASIC METABOLIC PANEL WITH GFR
BUN/Creatinine Ratio: 14 (calc) (ref 6–22)
BUN: 18 mg/dL (ref 7–25)
CO2: 27 mmol/L (ref 20–32)
CREATININE: 1.27 mg/dL — AB (ref 0.70–1.25)
Calcium: 10.3 mg/dL (ref 8.6–10.3)
Chloride: 99 mmol/L (ref 98–110)
GFR, EST AFRICAN AMERICAN: 70 mL/min/{1.73_m2} (ref >=60)
GFR, EST NON AFRICAN AMERICAN: 60 mL/min/{1.73_m2} (ref >=60)
Glucose, Bld: 137 mg/dL (ref 65–139)
POTASSIUM: 4.9 mmol/L (ref 3.5–5.3)
Sodium: 135 mmol/L (ref 135–146)

## 2018-05-23 LAB — PSA: PSA: 3.2 ng/mL (ref <=4.0)

## 2018-05-23 LAB — HEMOGLOBIN A1C
EAG (MMOL/L): 11.2 (calc)
HEMOGLOBIN A1C: 8.7 %{Hb} — AB (ref <5.7)
MEAN PLASMA GLUCOSE: 203 (calc)

## 2018-05-26 ENCOUNTER — Telehealth: Payer: Self-pay

## 2018-05-26 DIAGNOSIS — E1169 Type 2 diabetes mellitus with other specified complication: Secondary | ICD-10-CM

## 2018-05-26 DIAGNOSIS — E669 Obesity, unspecified: Principal | ICD-10-CM

## 2018-05-26 NOTE — Telephone Encounter (Signed)
Referral for diabetic education entered for uncontrolled diabetes per Dr.Simpson

## 2018-05-28 ENCOUNTER — Encounter: Payer: Self-pay | Admitting: Family Medicine

## 2018-05-28 NOTE — Assessment & Plan Note (Signed)
Improved, he is applauded on this Patient re-educated about  the importance of commitment to a  minimum of 150 minutes of exercise per week.  The importance of healthy food choices with portion control discussed. Encouraged to start a food diary, count calories and to consider  joining a support group. Sample diet sheets offered. Goals set by the patient for the next several months.   Weight /BMI 05/22/2018 03/24/2018 02/06/2018  WEIGHT 204 lb 213 lb 210 lb  HEIGHT 5\' 8"  5\' 8"  5\' 8"   BMI 31.02 kg/m2 32.39 kg/m2 31.93 kg/m2

## 2018-05-28 NOTE — Assessment & Plan Note (Signed)
Hyperlipidemia:Low fat diet discussed and encouraged.   Lipid Panel  Lab Results  Component Value Date   CHOL 94 01/31/2018   HDL 34 (L) 01/31/2018   LDLCALC 37 01/31/2018   TRIG 151 (H) 01/31/2018   CHOLHDL 2.8 01/31/2018   Updated lab needed at/ before next visit.

## 2018-05-28 NOTE — Assessment & Plan Note (Signed)
Controlled, no change in medication DASH diet and commitment to daily physical activity for a minimum of 30 minutes discussed and encouraged, as a part of hypertension management. The importance of attaining a healthy weight is also discussed.  BP/Weight 05/22/2018 03/24/2018 02/06/2018 11/21/2017 08/25/2017 08/12/2017 2/56/3893  Systolic BP 734 287 681 157 262 035 -  Diastolic BP 80 82 80 82 82 84 -  Wt. (Lbs) 204 213 210 211 208.25 - 202  BMI 31.02 32.39 31.93 32.08 31.66 - 30.71

## 2018-05-28 NOTE — Assessment & Plan Note (Signed)
Improved and controlled currently

## 2018-05-28 NOTE — Assessment & Plan Note (Signed)
[name] is reminded of the importance of commitment to daily physical activity for 30 minutes or more, as able and the need to limit carbohydrate intake to 30 to 60 grams per meal to help with blood sugar control.   The need to take medication as prescribed, test blood sugar as directed, and to call between visits if there is a concern that blood sugar is uncontrolled is also discussed.   [name] is reminded of the importance of daily foot exam, annual eye examination, and good blood sugar, blood pressure and cholesterol control. Uncontrolled ands deteriorated despite pt report of improved blood sugar, refer back to educator, uncertain if he is able to use his meter correctly also  Diabetic Labs Latest Ref Rng & Units 05/22/2018 01/31/2018 08/25/2017 05/25/2017 05/14/2017  HbA1c <5.7 % of total Hgb 8.7(H) 8.4(H) 8.5(H) - 10.1(H)  Microalbumin Not Estab. ug/mL - - - 564.4(H) -  Micro/Creat Ratio 0.0 - 30.0 mg/g creat - - - 353.0(H) -  Chol <200 mg/dL - 94 - - 99  HDL >40 mg/dL - 34(L) - - 33(L)  Calc LDL mg/dL (calc) - 37 - - 27  Triglycerides <150 mg/dL - 151(H) - - 196(H)  Creatinine 0.70 - 1.25 mg/dL 1.27(H) 1.16 1.23 - -   BP/Weight 05/22/2018 03/24/2018 02/06/2018 11/21/2017 08/25/2017 08/12/2017 04/04/7823  Systolic BP 235 361 443 154 008 676 -  Diastolic BP 80 82 80 82 82 84 -  Wt. (Lbs) 204 213 210 211 208.25 - 202  BMI 31.02 32.39 31.93 32.08 31.66 - 30.71   Foot/eye exam completion dates Latest Ref Rng & Units 05/22/2018 05/25/2017  Eye Exam No Retinopathy - -  Foot Form Completion - Done Done

## 2018-06-12 ENCOUNTER — Other Ambulatory Visit: Payer: Self-pay | Admitting: Family Medicine

## 2018-06-28 ENCOUNTER — Encounter: Payer: Medicare HMO | Attending: Family Medicine | Admitting: Nutrition

## 2018-06-28 ENCOUNTER — Encounter: Payer: Self-pay | Admitting: Nutrition

## 2018-06-28 VITALS — Ht 68.0 in | Wt 206.0 lb

## 2018-06-28 DIAGNOSIS — Z6831 Body mass index (BMI) 31.0-31.9, adult: Secondary | ICD-10-CM | POA: Insufficient documentation

## 2018-06-28 DIAGNOSIS — E1169 Type 2 diabetes mellitus with other specified complication: Secondary | ICD-10-CM | POA: Insufficient documentation

## 2018-06-28 DIAGNOSIS — E669 Obesity, unspecified: Secondary | ICD-10-CM | POA: Diagnosis not present

## 2018-06-28 DIAGNOSIS — E1165 Type 2 diabetes mellitus with hyperglycemia: Secondary | ICD-10-CM

## 2018-06-28 DIAGNOSIS — Z713 Dietary counseling and surveillance: Secondary | ICD-10-CM | POA: Diagnosis not present

## 2018-06-28 DIAGNOSIS — E118 Type 2 diabetes mellitus with unspecified complications: Secondary | ICD-10-CM

## 2018-06-28 DIAGNOSIS — IMO0002 Reserved for concepts with insufficient information to code with codable children: Secondary | ICD-10-CM

## 2018-06-28 NOTE — Progress Notes (Signed)
Diabetes Self-Management Education  Visit Type:  F/u  Appt. Start Time: 130Appt. End Time: 1430  06/28/2018  [name], identified by name and date of birth, is a 62 y.o. male with a diagnosis of Diabetes:  . Sees Dr. Moshe Cipro.  Glipizide 10 mg in am and 5 mg pm Metformin 100o mg BID. Changes made:  Been exercising an hour a day using the treadmill. Has cut down on sweets, bread, and eaitng brown rice, drinking more water and increasing more fresh vegetables and fruit.  7 day avg 117 mg/dl, 14 day 142 LO/VF64 day 137 mg/dl. Making great progress. Last A1C 8.7% but should be much better next time. Feels better. He notes he tends to get some lower blood sugar before lunch and sometimes in the middle of the night.  Gained 2 lbs He would benefit from reducing Glipizide and increasing Metformin if kidney function is better for greater BS control and wt loss.  Lab Results  Component Value Date   HGBA1C 8.7 (H) 05/22/2018     ASSESSMENT  Wt Readings from Last 3 Encounters:  06/28/18 206 lb (93.4 kg)  05/22/18 204 lb (92.5 kg)  03/24/18 213 lb (96.6 kg)   Ht Readings from Last 3 Encounters:  06/28/18 5\' 8"  (1.727 m)  05/22/18 5\' 8"  (1.727 m)  03/24/18 5\' 8"  (1.727 m)   Body mass index is 31.32 kg/m. @BMIFA @ Facility age limit for growth percentiles is 20 years. Facility age limit for growth percentiles is 20 years. Lipid Panel     Component Value Date/Time   CHOL 94 01/31/2018 0916   TRIG 151 (H) 01/31/2018 0916   HDL 34 (L) 01/31/2018 0916   CHOLHDL 2.8 01/31/2018 0916   VLDL 39 (H) 05/14/2017 0831   LDLCALC 37 01/31/2018 0916   CMP Latest Ref Rng & Units 05/22/2018 01/31/2018 08/25/2017  Glucose 65 - 139 mg/dL 137 152(H) 182(H)  BUN 7 - 25 mg/dL 18 16 14   Creatinine 0.70 - 1.25 mg/dL 1.27(H) 1.16 1.23  Sodium 135 - 146 mmol/L 135 136 138  Potassium 3.5 - 5.3 mmol/L 4.9 4.7 4.5  Chloride 98 - 110 mmol/L 99 103 101  CO2 20 - 32 mmol/L 27 28 28   Calcium 8.6 - 10.3  mg/dL 10.3 9.7 9.8  Total Protein 6.1 - 8.1 g/dL - 7.1 7.2  Total Bilirubin 0.2 - 1.2 mg/dL - 0.6 0.7  Alkaline Phos 40 - 115 U/L - - -  AST 10 - 35 U/L - 10 11  ALT 9 - 46 U/L - 11 17       Individualized Plan for Diabetes Self-Management Training:   Learning Objective:  Patient will have a greater understanding of diabetes self-management. Patient education plan is to attend individual and/or group sessions per assessed needs and concerns.   Plan:  Goals 1. Cut out sweetts 2. Increase exercise 3. Test twice a day, in am and before bed Get A1C down to 7% or less.   Expected Outcomes:    Improved self management of his DM.  Education material provided: Living Well with Diabetes, A1C conversion sheet, Meal plan card, My Plate and Carbohydrate counting sheet  If problems or questions, patient to contact team via:  Phone and Email  Future DSME appointment:  3 months

## 2018-06-28 NOTE — Patient Instructions (Addendum)
Goals 1. Cut out sweetts 2. Increase exercise 3. Test twice a day, in am and before bed Get A1C down to 7% or less.

## 2018-07-24 ENCOUNTER — Telehealth: Payer: Self-pay | Admitting: Family Medicine

## 2018-07-24 ENCOUNTER — Other Ambulatory Visit: Payer: Self-pay | Admitting: Family Medicine

## 2018-07-24 MED ORDER — TRAMADOL HCL 50 MG PO TABS: ORAL_TABLET | ORAL | 0 refills | Status: DC

## 2018-07-24 NOTE — Telephone Encounter (Signed)
Pt stopped by to advise that he is still hurting on both Feet, hands, and knees, we made an appt for Oct 2, but was wanting to know if you could call him in something,  In the morning they hurt bad, and takes him about 10-15 mins to get moving, then during the day if he stops and sits is the samething

## 2018-07-24 NOTE — Telephone Encounter (Signed)
Tramadol once daily as needed is prescribed, and I recommend using ES tylenol one daily also. He may need to see a rheumatologist , will discuss at visit.Please let him know

## 2018-07-26 NOTE — Telephone Encounter (Signed)
Pt aware.

## 2018-07-31 ENCOUNTER — Other Ambulatory Visit: Payer: Self-pay | Admitting: Family Medicine

## 2018-08-09 ENCOUNTER — Ambulatory Visit: Payer: Medicare HMO | Admitting: Family Medicine

## 2018-08-10 ENCOUNTER — Ambulatory Visit: Payer: Medicare HMO | Admitting: Family Medicine

## 2018-08-30 ENCOUNTER — Ambulatory Visit (INDEPENDENT_AMBULATORY_CARE_PROVIDER_SITE_OTHER): Payer: Medicare HMO | Admitting: Family Medicine

## 2018-08-30 ENCOUNTER — Encounter: Payer: Self-pay | Admitting: Family Medicine

## 2018-08-30 ENCOUNTER — Other Ambulatory Visit: Payer: Self-pay

## 2018-08-30 VITALS — BP 120/74 | HR 91 | Resp 12 | Ht 68.0 in | Wt 206.1 lb

## 2018-08-30 DIAGNOSIS — R079 Chest pain, unspecified: Secondary | ICD-10-CM | POA: Insufficient documentation

## 2018-08-30 DIAGNOSIS — Z23 Encounter for immunization: Secondary | ICD-10-CM

## 2018-08-30 DIAGNOSIS — E1169 Type 2 diabetes mellitus with other specified complication: Secondary | ICD-10-CM | POA: Diagnosis not present

## 2018-08-30 DIAGNOSIS — R0789 Other chest pain: Secondary | ICD-10-CM | POA: Diagnosis not present

## 2018-08-30 DIAGNOSIS — E669 Obesity, unspecified: Secondary | ICD-10-CM

## 2018-08-30 DIAGNOSIS — E6609 Other obesity due to excess calories: Secondary | ICD-10-CM

## 2018-08-30 DIAGNOSIS — E785 Hyperlipidemia, unspecified: Secondary | ICD-10-CM | POA: Diagnosis not present

## 2018-08-30 DIAGNOSIS — I10 Essential (primary) hypertension: Secondary | ICD-10-CM | POA: Diagnosis not present

## 2018-08-30 DIAGNOSIS — M940 Chondrocostal junction syndrome [Tietze]: Secondary | ICD-10-CM | POA: Diagnosis not present

## 2018-08-30 NOTE — Patient Instructions (Signed)
F/u as before, call if you need me sooner  Flu vaccine and microalb today from office  [facility] shows no acute heart damage  Take tylenol one tablet twice daily for next 1 week, and stop upper body exercise during this time  You are referred to cardiology, your EKG  Shows no sign of heart damage

## 2018-08-30 NOTE — Assessment & Plan Note (Signed)
Pt

## 2018-08-30 NOTE — Progress Notes (Signed)
Bobie Kistler     MRN: [id number]      DOB: Aug 24, 1956   HPI Mr. Montz is here forfwith a 1 week h/o intermittent substernal and left sided chest pain, worse when he turns the steering wheel  For the first time, concerned about heart attack. No h/o nausea, light headedness, vomit, no f/h of premature CAd, however he is high risk for CAD based on co morbidities.  Denies polyuria, polydipsia, blurred vision , or hypoglycemic episodes. Blood sugar averaging over 160, most mornings still   ROS Denies recent fever or chills. Denies sinus pressure, nasal congestion, ear pain or sore throat. Denies chest congestion, productive cough or wheezing. Denies PND, orthopnea, palpitations and leg swelling Denies abdominal pain, nausea, vomiting,diarrhea or constipation.   Denies dysuria, frequency, hesitancy or incontinence.c/o chronic  joint pain, swelling and limitation in mobility. Denies headaches, seizures, numbness, or tingling. Denies uncontrolled depression, anxiety or insomnia. Denies skin break down or rash.   PE  BP 120/74 (BP Location: Left Arm, Patient Position: Sitting, Cuff Size: Large)   Pulse 91   Resp 12   Ht 5\' 8"  (1.727 m)   Wt 206 lb 1.3 oz (93.5 kg)   SpO2 94% Comment: room air  BMI 31.33 kg/m   Patient alert and oriented and in no cardiopulmonary distress.  HEENT: No facial asymmetry, EOMI,   oropharynx pink and moist.  Neck supple no JVD, no mass.  Chest: Clear to auscultation bilaterally.Reproducible anterior left intercostal junction pain CC2 , 3 4  CVS: S1, S2 no murmurs, no S3.Regular rate. EKG: NSR, rate 91, no LVH , no ischemic changes  ABD: Soft non tender.   Ext: No edema  MS: decreased  ROM spine, shoulders, hips and knees.  Skin: Intact, no ulcerations or rash noted.  Psych: Good eye contact, normal affect. Memory intact not anxious or depressed appearing.  CNS: CN 2-12 intact, power,  normal throughout.no focal deficits noted.   Assessment &  Plan  Chest pain of uncertain etiology Pt   Costochondritis, acute Tylenol for pain relief and discontinue upper body exercise for next 1 week Normal eKG  Need for immunization against influenza After obtaining informed consent, the vaccine is  administered by LPN.   Chest pain with moderate risk of acute coronary syndrome History and exam consistent with costochondritis, however based on co morbidities, pt at moderate risk for CAD, needs cardiology evaluation no  Echo on file, will refer to cardiollogy  Hypertension goal BP (blood pressure) < 130/80 Controlled, no change in medication DASH diet and commitment to daily physical activity for a minimum of 30 minutes discussed and encouraged, as a part of hypertension management. The importance of attaining a healthy weight is also discussed.  BP/Weight 08/30/2018 06/28/2018 05/22/2018 03/24/2018 02/06/2018 11/21/2017 40/98/1191  Systolic BP 478 - 295 621 308 657 846  Diastolic BP 74 - 80 82 80 82 82  Wt. (Lbs) 206.08 206 204 213 210 211 208.25  BMI 31.33 31.32 31.02 32.39 31.93 32.08 31.66       Diabetes mellitus type 2 in obese [facility]) Uncontrolled Mr. Brayman is reminded of the importance of commitment to daily physical activity for 30 minutes or more, as able and the need to limit carbohydrate intake to 30 to 60 grams per meal to help with blood sugar control.   The need to take medication as prescribed, test blood sugar as directed, and to call between visits if there is a concern that blood sugar is uncontrolled  is also discussed.   Mr. Iten is reminded of the importance of daily foot exam, annual eye examination, and good blood sugar, blood pressure and cholesterol control. Updated lab needed at/ before next visit.   Diabetic Labs Latest Ref Rng & Units 08/31/2018 05/22/2018 01/31/2018 08/25/2017 05/25/2017  HbA1c <5.7 % of total Hgb - 8.7(H) 8.4(H) 8.5(H) -  Microalbumin Not Estab. ug/mL 844.2(H) - - - 564.4(H)  Micro/Creat Ratio  0.0 - 30.0 mg/g creat 304.9(H) - - - 353.0(H)  Chol <200 mg/dL - - 94 - -  HDL >40 mg/dL - - 34(L) - -  Calc LDL mg/dL (calc) - - 37 - -  Triglycerides <150 mg/dL - - 151(H) - -  Creatinine 0.70 - 1.25 mg/dL - 1.27(H) 1.16 1.23 -   BP/Weight 08/30/2018 06/28/2018 05/22/2018 03/24/2018 02/06/2018 11/21/2017 81/38/8719  Systolic BP 597 - 471 855 015 868 257  Diastolic BP 74 - 80 82 80 82 82  Wt. (Lbs) 206.08 206 204 213 210 211 208.25  BMI 31.33 31.32 31.02 32.39 31.93 32.08 31.66   Foot/eye exam completion dates Latest Ref Rng & Units 05/22/2018 05/25/2017  Eye Exam No Retinopathy - -  Foot Form Completion - Done Done        Obesity Unchanged Patient re-educated about  the importance of commitment to a  minimum of 150 minutes of exercise per week.  The importance of healthy food choices with portion control discussed. Encouraged to start a food diary, count calories and to consider  joining a support group. Sample diet sheets offered. Goals set by the patient for the next several months.   Weight /BMI 08/30/2018 06/28/2018 05/22/2018  WEIGHT 206 lb 1.3 oz 206 lb 204 lb  HEIGHT 5\' 8"  5\' 8"  5\' 8"   BMI 31.33 kg/m2 31.32 kg/m2 31.02 kg/m2

## 2018-08-30 NOTE — Assessment & Plan Note (Addendum)
Tylenol for pain relief and discontinue upper body exercise for next 1 week Normal eKG

## 2018-08-30 NOTE — Assessment & Plan Note (Signed)
After obtaining informed consent, the vaccine is  administered by LPN.  

## 2018-08-31 ENCOUNTER — Other Ambulatory Visit (HOSPITAL_COMMUNITY)
Admission: RE | Admit: 2018-08-31 | Discharge: 2018-08-31 | Disposition: A | Discharge status: Home / Self Care | Payer: Medicare HMO | Source: Other Acute Inpatient Hospital | Attending: Family Medicine | Admitting: Family Medicine

## 2018-08-31 DIAGNOSIS — E119 Type 2 diabetes mellitus without complications: Secondary | ICD-10-CM | POA: Diagnosis not present

## 2018-09-01 ENCOUNTER — Other Ambulatory Visit: Payer: Self-pay

## 2018-09-01 LAB — MICROALBUMIN / CREATININE URINE RATIO
Creatinine, Urine: 276.9 mg/dL
Microalb Creat Ratio: 304.9 mg/g{creat} — ABNORMAL HIGH (ref 0.0–30.0)
Microalb, Ur: 844.2 ug/mL — ABNORMAL HIGH

## 2018-09-01 MED ORDER — GLUCOSE BLOOD VI STRP: ORAL_STRIP | 3 refills | Status: DC

## 2018-09-03 ENCOUNTER — Encounter: Payer: Self-pay | Admitting: Family Medicine

## 2018-09-03 DIAGNOSIS — R079 Chest pain, unspecified: Secondary | ICD-10-CM | POA: Insufficient documentation

## 2018-09-03 NOTE — Assessment & Plan Note (Signed)
Unchanged Patient re-educated about  the importance of commitment to a  minimum of 150 minutes of exercise per week.  The importance of healthy food choices with portion control discussed. Encouraged to start a food diary, count calories and to consider  joining a support group. Sample diet sheets offered. Goals set by the patient for the next several months.   Weight /BMI 08/30/2018 06/28/2018 05/22/2018  WEIGHT 206 lb 1.3 oz 206 lb 204 lb  HEIGHT 5\' 8"  5\' 8"  5\' 8"   BMI 31.33 kg/m2 31.32 kg/m2 31.02 kg/m2

## 2018-09-03 NOTE — Assessment & Plan Note (Signed)
Uncontrolled Norman Peters is reminded of the importance of commitment to daily physical activity for 30 minutes or more, as able and the need to limit carbohydrate intake to 30 to 60 grams per meal to help with blood sugar control.   The need to take medication as prescribed, test blood sugar as directed, and to call between visits if there is a concern that blood sugar is uncontrolled is also discussed.   Norman Peters is reminded of the importance of daily foot exam, annual eye examination, and good blood sugar, blood pressure and cholesterol control. Updated lab needed at/ before next visit.   Diabetic Labs Latest Ref Rng & Units 08/31/2018 05/22/2018 01/31/2018 08/25/2017 05/25/2017  HbA1c <5.7 % of total Hgb - 8.7(H) 8.4(H) 8.5(H) -  Microalbumin Not Estab. ug/mL 844.2(H) - - - 564.4(H)  Micro/Creat Ratio 0.0 - 30.0 mg/g creat 304.9(H) - - - 353.0(H)  Chol <200 mg/dL - - 94 - -  HDL >40 mg/dL - - 34(L) - -  Calc LDL mg/dL (calc) - - 37 - -  Triglycerides <150 mg/dL - - 151(H) - -  Creatinine 0.70 - 1.25 mg/dL - 1.27(H) 1.16 1.23 -   BP/Weight 08/30/2018 06/28/2018 05/22/2018 03/24/2018 02/06/2018 11/21/2017 77/09/6578  Systolic BP 038 - 333 832 919 166 060  Diastolic BP 74 - 80 82 80 82 82  Wt. (Lbs) 206.08 206 204 213 210 211 208.25  BMI 31.33 31.32 31.02 32.39 31.93 32.08 31.66   Foot/eye exam completion dates Latest Ref Rng & Units 05/22/2018 05/25/2017  Eye Exam No Retinopathy - -  Foot Form Completion - Done Done

## 2018-09-03 NOTE — Assessment & Plan Note (Signed)
Controlled, no change in medication DASH diet and commitment to daily physical activity for a minimum of 30 minutes discussed and encouraged, as a part of hypertension management. The importance of attaining a healthy weight is also discussed.  BP/Weight 08/30/2018 06/28/2018 05/22/2018 03/24/2018 02/06/2018 11/21/2017 37/12/3015  Systolic BP 209 - 106 816 619 694 098  Diastolic BP 74 - 80 82 80 82 82  Wt. (Lbs) 206.08 206 204 213 210 211 208.25  BMI 31.33 31.32 31.02 32.39 31.93 32.08 31.66

## 2018-09-03 NOTE — Assessment & Plan Note (Signed)
History and exam consistent with costochondritis, however based on co morbidities, pt at moderate risk for CAD, needs cardiology evaluation no  Echo on file, will refer to cardiollogy

## 2018-09-04 ENCOUNTER — Other Ambulatory Visit: Payer: Self-pay | Admitting: Family Medicine

## 2018-09-07 ENCOUNTER — Other Ambulatory Visit: Payer: Self-pay | Admitting: Family Medicine

## 2018-09-12 ENCOUNTER — Other Ambulatory Visit: Payer: Self-pay | Admitting: Family Medicine

## 2018-09-12 MED ORDER — TRAMADOL HCL 50 MG PO TABS: ORAL_TABLET | ORAL | 3 refills | Status: DC

## 2018-09-19 ENCOUNTER — Telehealth: Payer: Self-pay | Admitting: Family Medicine

## 2018-09-19 DIAGNOSIS — E785 Hyperlipidemia, unspecified: Secondary | ICD-10-CM | POA: Diagnosis not present

## 2018-09-19 DIAGNOSIS — E1169 Type 2 diabetes mellitus with other specified complication: Secondary | ICD-10-CM | POA: Diagnosis not present

## 2018-09-19 DIAGNOSIS — I10 Essential (primary) hypertension: Secondary | ICD-10-CM | POA: Diagnosis not present

## 2018-09-19 DIAGNOSIS — E669 Obesity, unspecified: Secondary | ICD-10-CM | POA: Diagnosis not present

## 2018-09-19 NOTE — Telephone Encounter (Signed)
Spoke with patient and let him know he has a script at the pharmacy. Pt verbalized understanding.

## 2018-09-19 NOTE — Telephone Encounter (Signed)
Norman Peters is requesting a refill on his traMADol (ULTRAM) 50 MG tablet  Sent to [facility] in [place], please advise?

## 2018-09-20 LAB — COMPLETE METABOLIC PANEL WITH GFR
AG Ratio: 1.4 (calc) (ref 1.0–2.5)
ALT: 12 U/L (ref 9–46)
AST: 10 U/L (ref 10–35)
Albumin: 4.1 g/dL (ref 3.6–5.1)
Alkaline phosphatase (APISO): 73 U/L (ref 40–115)
BILIRUBIN TOTAL: 0.8 mg/dL (ref 0.2–1.2)
BUN: 13 mg/dL (ref 7–25)
CALCIUM: 9.8 mg/dL (ref 8.6–10.3)
CHLORIDE: 99 mmol/L (ref 98–110)
CO2: 29 mmol/L (ref 20–32)
Creat: 1.21 mg/dL (ref 0.70–1.25)
GFR, EST AFRICAN AMERICAN: 74 mL/min/{1.73_m2} (ref >=60)
GFR, EST NON AFRICAN AMERICAN: 64 mL/min/{1.73_m2} (ref >=60)
GLUCOSE: 199 mg/dL — AB (ref 65–99)
Globulin: 3 g/dL (calc) (ref 1.9–3.7)
Potassium: 4.6 mmol/L (ref 3.5–5.3)
Sodium: 136 mmol/L (ref 135–146)
TOTAL PROTEIN: 7.1 g/dL (ref 6.1–8.1)

## 2018-09-20 LAB — LIPID PANEL
CHOLESTEROL: 108 mg/dL (ref <200)
HDL: 36 mg/dL — ABNORMAL LOW (ref >40)
LDL CHOLESTEROL (CALC): 47 mg/dL
Non-HDL Cholesterol (Calc): 72 mg/dL (calc) (ref <130)
Total CHOL/HDL Ratio: 3 (calc) (ref <5.0)
Triglycerides: 172 mg/dL — ABNORMAL HIGH (ref <150)

## 2018-09-20 LAB — CBC
HEMATOCRIT: 39.4 % (ref 38.5–50.0)
Hemoglobin: 13.7 g/dL (ref 13.2–17.1)
MCH: 32.2 pg (ref 27.0–33.0)
MCHC: 34.8 g/dL (ref 32.0–36.0)
MCV: 92.7 fL (ref 80.0–100.0)
MPV: 8.9 fL (ref 7.5–12.5)
Platelets: 308 10*3/uL (ref 140–400)
RBC: 4.25 10*6/uL (ref 4.20–5.80)
RDW: 14.5 % (ref 11.0–15.0)
WBC: 8.8 10*3/uL (ref 3.8–10.8)

## 2018-09-20 LAB — HEMOGLOBIN A1C
EAG (MMOL/L): 11.6 (calc)
Hgb A1c MFr Bld: 8.9 % of total Hgb — ABNORMAL HIGH (ref <5.7)
Mean Plasma Glucose: 209 (calc)

## 2018-09-20 LAB — MICROALBUMIN, URINE: MICROALB UR: 83.1 mg/dL

## 2018-09-25 ENCOUNTER — Encounter: Payer: Self-pay | Admitting: Family Medicine

## 2018-09-25 ENCOUNTER — Ambulatory Visit (INDEPENDENT_AMBULATORY_CARE_PROVIDER_SITE_OTHER): Payer: Medicare HMO | Admitting: Family Medicine

## 2018-09-25 VITALS — BP 130/80 | HR 93 | Resp 16 | Ht 68.0 in | Wt 207.0 lb

## 2018-09-25 DIAGNOSIS — Z6832 Body mass index (BMI) 32.0-32.9, adult: Secondary | ICD-10-CM

## 2018-09-25 DIAGNOSIS — E1169 Type 2 diabetes mellitus with other specified complication: Secondary | ICD-10-CM

## 2018-09-25 DIAGNOSIS — F419 Anxiety disorder, unspecified: Secondary | ICD-10-CM | POA: Diagnosis not present

## 2018-09-25 DIAGNOSIS — I10 Essential (primary) hypertension: Secondary | ICD-10-CM | POA: Diagnosis not present

## 2018-09-25 DIAGNOSIS — E6609 Other obesity due to excess calories: Secondary | ICD-10-CM

## 2018-09-25 DIAGNOSIS — E669 Obesity, unspecified: Secondary | ICD-10-CM | POA: Diagnosis not present

## 2018-09-25 DIAGNOSIS — F329 Major depressive disorder, single episode, unspecified: Secondary | ICD-10-CM | POA: Diagnosis not present

## 2018-09-25 DIAGNOSIS — F32A Depression, unspecified: Secondary | ICD-10-CM

## 2018-09-25 MED ORDER — AMLODIPINE BESYLATE 10 MG PO TABS: 10.0000 mg | ORAL_TABLET | Freq: Every day | ORAL | 1 refills | Status: DC

## 2018-09-25 MED ORDER — METFORMIN HCL 1000 MG PO TABS: ORAL_TABLET | ORAL | 1 refills | Status: DC

## 2018-09-25 MED ORDER — EMPAGLIFLOZIN 25 MG PO TABS: 25.0000 mg | ORAL_TABLET | Freq: Every day | ORAL | 11 refills | Status: DC

## 2018-09-25 MED ORDER — GLIPIZIDE 10 MG PO TABS: 10.0000 mg | ORAL_TABLET | Freq: Two times a day (BID) | ORAL | 1 refills | Status: DC

## 2018-09-25 MED ORDER — SPIRONOLACTONE 50 MG PO TABS: 50.0000 mg | ORAL_TABLET | Freq: Every day | ORAL | 1 refills | Status: DC

## 2018-09-25 MED ORDER — BUSPIRONE HCL 5 MG PO TABS: 5.0000 mg | ORAL_TABLET | Freq: Two times a day (BID) | ORAL | 1 refills | Status: DC

## 2018-09-25 MED ORDER — FLUOXETINE HCL 20 MG PO CAPS: 20.0000 mg | ORAL_CAPSULE | Freq: Every day | ORAL | 0 refills | Status: DC

## 2018-09-25 NOTE — Assessment & Plan Note (Signed)
Notes increased anxiety and stress, now that 4 additional family members currently live in his home, encouraged to take the situation in stride , and put energy into good health habits  No change in medication

## 2018-09-25 NOTE — Progress Notes (Signed)
Norman Peters     MRN: [id number]      DOB: 1956-01-11   HPI Norman Peters is here for follow up and re-evaluation of chronic medical conditions, medication management and review of any available recent lab and radiology data.  Preventive health is updated, specifically  Cancer screening and Immunization.   Questions or concerns regarding consultations or procedures which the PT has had in the interim are  addressed. The PT denies any adverse reactions to current medications since the last visit.  Increased stress and poor eating and food choice , since his  [name], 4 total have moved in Denies polyuria, polydipsia, blurred vision , or hypoglycemic episodes. C/o bilateral foot pain and numbness   ROS Denies recent fever or chills. Denies sinus pressure, nasal congestion, ear pain or sore throat. Denies chest congestion, productive cough or wheezing. Denies chest pains, palpitations and leg swelling Denies abdominal pain, nausea, vomiting,diarrhea or constipation.   Denies dysuria, frequency, hesitancy or incontinence. Denies skin break down or rash.   PE  BP 130/80   Pulse 93   Resp 16   Ht 5\' 8"  (1.727 m)   Wt 207 lb (93.9 kg)   SpO2 94%   BMI 31.47 kg/m   Patient alert and oriented and in no cardiopulmonary distress.  HEENT: No facial asymmetry, EOMI,   oropharynx pink and moist.  Neck supple no JVD, no mass.  Chest: Clear to auscultation bilaterally.  CVS: S1, S2 no murmurs, no S3.Regular rate.  ABD: Soft non tender.   Ext: No edema  MS: Adequate ROM spine, shoulders, hips and knees.  Skin: Intact, no ulcerations or rash noted.  Psych: Good eye contact, normal affect. Memory intact not anxious or depressed appearing.  CNS: CN 2-12 intact, power,  normal throughout.no focal deficits noted.   Assessment & Plan  Hypertension goal BP (blood pressure) < 130/80 Controlled, no change in medication DASH diet and commitment to daily physical activity for a  minimum of 30 minutes discussed and encouraged, as a part of hypertension management. The importance of attaining a healthy weight is also discussed.  BP/Weight 09/25/2018 08/30/2018 06/28/2018 05/22/2018 03/24/2018 02/06/2018 7/82/9562  Systolic BP 130 865 - 784 696 295 284  Diastolic BP 80 74 - 80 82 80 82  Wt. (Lbs) 207 206.08 206 204 213 210 211  BMI 31.47 31.33 31.32 31.02 32.39 31.93 32.08       Obesity Unchanged Patient re-educated about  the importance of commitment to a  minimum of 150 minutes of exercise per week.  The importance of healthy food choices with portion control discussed. Encouraged to start a food diary, count calories and to consider  joining a support group. Sample diet sheets offered. Goals set by the patient for the next several months.   Weight /BMI 09/25/2018 08/30/2018 06/28/2018  WEIGHT 207 lb 206 lb 1.3 oz 206 lb  HEIGHT 5\' 8"  5\' 8"  5\' 8"   BMI 31.47 kg/m2 31.33 kg/m2 31.32 kg/m2      Anxiety and depression Notes increased anxiety and stress, now that 4 additional family members currently live in his home, encouraged to take the situation in stride , and put energy into good health habits  No change in medication  Diabetes mellitus type 2 in obese ([place]) Deteriorated and uncontrolled [name] is reminded of the importance of commitment to daily physical activity for 30 minutes or more, as able and the need to limit carbohydrate intake to 30 to 60 grams per  meal to help with blood sugar control.   The need to take medication as prescribed, test blood sugar as directed, and to call between visits if there is a concern that blood sugar is uncontrolled is also discussed.   [name] is reminded of the importance of daily foot exam, annual eye examination, and good blood sugar, blood pressure and cholesterol control.  Diabetic Labs Latest Ref Rng & Units 09/19/2018 08/31/2018 05/22/2018 01/31/2018 08/25/2017  HbA1c <5.7 % of total Hgb 8.9(H) - 8.7(H)  8.4(H) 8.5(H)  Microalbumin mg/dL 83.1 844.2(H) - - -  Micro/Creat Ratio 0.0 - 30.0 mg/g creat - 304.9(H) - - -  Chol <200 mg/dL 108 - - 94 -  HDL >40 mg/dL 36(L) - - 34(L) -  Calc LDL mg/dL (calc) 47 - - 37 -  Triglycerides <150 mg/dL 172(H) - - 151(H) -  Creatinine 0.70 - 1.25 mg/dL 1.21 - 1.27(H) 1.16 1.23   BP/Weight 09/25/2018 08/30/2018 06/28/2018 05/22/2018 03/24/2018 02/06/2018 01/25/2333  Systolic BP 356 861 - 683 729 021 115  Diastolic BP 80 74 - 80 82 80 82  Wt. (Lbs) 207 206.08 206 204 213 210 211  BMI 31.47 31.33 31.32 31.02 32.39 31.93 32.08   Foot/eye exam completion dates Latest Ref Rng & Units 05/22/2018 05/25/2017  Eye Exam No Retinopathy - -  Foot Form Completion - Done Done

## 2018-09-25 NOTE — Assessment & Plan Note (Signed)
Deteriorated and uncontrolled Norman Peters is reminded of the importance of commitment to daily physical activity for 30 minutes or more, as able and the need to limit carbohydrate intake to 30 to 60 grams per meal to help with blood sugar control.   The need to take medication as prescribed, test blood sugar as directed, and to call between visits if there is a concern that blood sugar is uncontrolled is also discussed.   Norman Peters is reminded of the importance of daily foot exam, annual eye examination, and good blood sugar, blood pressure and cholesterol control.  Diabetic Labs Latest Ref Rng & Units 09/19/2018 08/31/2018 05/22/2018 01/31/2018 08/25/2017  HbA1c <5.7 % of total Hgb 8.9(H) - 8.7(H) 8.4(H) 8.5(H)  Microalbumin mg/dL 83.1 844.2(H) - - -  Micro/Creat Ratio 0.0 - 30.0 mg/g creat - 304.9(H) - - -  Chol <200 mg/dL 108 - - 94 -  HDL >40 mg/dL 36(L) - - 34(L) -  Calc LDL mg/dL (calc) 47 - - 37 -  Triglycerides <150 mg/dL 172(H) - - 151(H) -  Creatinine 0.70 - 1.25 mg/dL 1.21 - 1.27(H) 1.16 1.23   BP/Weight 09/25/2018 08/30/2018 06/28/2018 05/22/2018 03/24/2018 02/06/2018 6/31/4970  Systolic BP 263 785 - 885 027 741 287  Diastolic BP 80 74 - 80 82 80 82  Wt. (Lbs) 207 206.08 206 204 213 210 211  BMI 31.47 31.33 31.32 31.02 32.39 31.93 32.08   Foot/eye exam completion dates Latest Ref Rng & Units 05/22/2018 05/25/2017  Eye Exam No Retinopathy - -  Foot Form Completion - Done Done

## 2018-09-25 NOTE — Assessment & Plan Note (Signed)
Unchanged Patient re-educated about  the importance of commitment to a  minimum of 150 minutes of exercise per week.  The importance of healthy food choices with portion control discussed. Encouraged to start a food diary, count calories and to consider  joining a support group. Sample diet sheets offered. Goals set by the patient for the next several months.   Weight /BMI 09/25/2018 08/30/2018 06/28/2018  WEIGHT 207 lb 206 lb 1.3 oz 206 lb  HEIGHT 5\' 8"  5\' 8"  5\' 8"   BMI 31.47 kg/m2 31.33 kg/m2 31.32 kg/m2

## 2018-09-25 NOTE — Assessment & Plan Note (Signed)
Controlled, no change in medication DASH diet and commitment to daily physical activity for a minimum of 30 minutes discussed and encouraged, as a part of hypertension management. The importance of attaining a healthy weight is also discussed.  BP/Weight 09/25/2018 08/30/2018 06/28/2018 05/22/2018 03/24/2018 02/06/2018 0/98/1191  Systolic BP 478 295 - 621 308 657 846  Diastolic BP 80 74 - 80 82 80 82  Wt. (Lbs) 207 206.08 206 204 213 210 211  BMI 31.47 31.33 31.32 31.02 32.39 31.93 32.08

## 2018-09-25 NOTE — Patient Instructions (Addendum)
Physical exam with MD in 13 weeks, call if you need me sooner  Increase gl;ipizide to 10 mg one two times daily, you may take glipizide 5 mg tablets TWO twice dail till done, then ONLY the 10 mg tablets one twice daily  Burning and pain in feet will improve with your blood sugar  NEED to change food choice  For your your diabetes to be controlled  Please schedule your eye exam at " [facility]" , [address] [place] ( [employer])    HBA1C , non fast chem 7 and EGFR in 12 weeks   Goal for fasting blood sugar ranges from 90 to 130 and 2 hours after any meal or at bedtime should be between 130 to 180.  It is important that you exercise regularly at least 30 minutes 5 times a week. If you develop chest pain, have severe difficulty breathing, or feel very tired, stop exercising immediately and seek medical attention

## 2018-10-03 ENCOUNTER — Ambulatory Visit: Payer: Medicare HMO | Admitting: Nutrition

## 2018-10-17 NOTE — Progress Notes (Signed)
Cardiology Office Note  Date: 10/18/2018   ID: Norman Peters, DOB 1956/07/31, MRN [id number]  PCP: Fayrene Helper, MD  Consulting Cardiologist: Rozann Lesches, MD   Chief Complaint  Patient presents with  . Chest Pain    History of Present Illness: Norman Peters is a 62 y.o. male referred for cardiology consultation by Dr. Moshe Cipro for evaluation of chest pain.  He describes an episode of left-sided chest soreness that occurred approximately 2 months ago in the setting of exercise.  [facility] the [facility] center, walks on the treadmill and sometimes uses a stationary bicycle.  He was using more arm weights and upper body exercises at that point.  It sounds like this may have been related to musculoskeletal soreness, symptoms went away after about a week, he was using Tylenol and tramadol.  Cardiac risk factors include age and gender, hypertension, hyperlipidemia, and type 2 diabetes mellitus.  I reviewed his recent ECG.  He has not undergone any previous ischemic testing.  Current medications include aspirin, Norvasc, Aldactone, and Pravachol.  Recent LDL 47.  Past Medical History:  Diagnosis Date  . Anxiety   . Arthritis   . Depression 2001  . Diabetes mellitus 2010  . GERD (gastroesophageal reflux disease)   . Hyperlipidemia   . Hypertension 2005    Past Surgical History:  Procedure Laterality Date  . COLONOSCOPY N/A 07/07/2016   Procedure: COLONOSCOPY;  Surgeon: Rogene Houston, MD;  Location: [facility];  Service: Endoscopy;  Laterality: N/A;  1030  . FINGER SURGERY Left 1974   fifth , trauma on job, had to replace/repair finger  . HERNIA REPAIR  2694   x3, umbilical and bilateral hernia    Current Outpatient Medications  Medication Sig Dispense Refill  . amLODipine (NORVASC) 10 MG tablet Take 1 tablet (10 mg total) by mouth daily. 90 tablet 1  . aspirin EC 81 MG tablet Take 81 mg by mouth daily.    . busPIRone (BUSPAR) 5 MG tablet Take 1 tablet (5 mg total)  by mouth 2 (two) times daily. 180 tablet 1  . Cholecalciferol (VITAMIN D3) 400 units CAPS Take 2 capsules by mouth daily.    Marland Kitchen FLUoxetine (PROZAC) 10 MG capsule TAKE 1 CAPSULE EVERY DAY (DOSE INCREASE, 30 MG TOTAL) 90 capsule 3  . FLUoxetine (PROZAC) 20 MG capsule Take 1 capsule (20 mg total) by mouth daily. 90 capsule 0  . glipiZIDE (GLUCOTROL) 10 MG tablet Take 1 tablet (10 mg total) by mouth 2 (two) times daily before a meal. 180 tablet 1  . glucose blood (TRUE METRIX BLOOD GLUCOSE TEST) test strip USE  TO  TEST ONE TIME DAILY 100 each 3  . metFORMIN (GLUCOPHAGE) 1000 MG tablet TAKE 1 TABLET TWICE DAILY WITH A MEAL 180 tablet 1  . pravastatin (PRAVACHOL) 10 MG tablet Take 1 tablet (10 mg total) by mouth daily. 90 tablet 3  . spironolactone (ALDACTONE) 50 MG tablet Take 1 tablet (50 mg total) by mouth daily. 90 tablet 1  . traMADol (ULTRAM) 50 MG tablet TAKE 1 TABLET BY MOUTH ONCE DAILY AS NEEDED FOR  UNCONTROLLED  JOINT  PAIN 30 tablet 0  . TRUEPLUS LANCETS 28G MISC USE  TO  TEST ONE TIME DAILY 100 each 3   No current facility-administered medications for this visit.    Allergies:  Ibuprofen; Lisinopril; Other; and Strawberry [berry]   Social History: The patient  reports that he quit smoking about 7 years ago. He has  never used smokeless tobacco. He reports that he does not drink alcohol or use drugs.   Family History: The patient's family history includes Stroke in his mother.   ROS:  Please see the history of present illness. Otherwise, complete review of systems is positive for bilateral arthritic knee pain.  All other systems are reviewed and negative.   Physical Exam: VS:  BP 124/82 (BP Location: Right Arm)   Pulse 90   Ht 5\' 8"  (1.727 m)   Wt 208 lb 9.6 oz (94.6 kg)   SpO2 93%   BMI 31.72 kg/m , BMI Body mass index is 31.72 kg/m.  Wt Readings from Last 3 Encounters:  10/18/18 208 lb 9.6 oz (94.6 kg)  09/25/18 207 lb (93.9 kg)  08/30/18 206 lb 1.3 oz (93.5 kg)      General: Patient appears comfortable at rest.  Using a cane. HEENT: Conjunctiva and lids normal, oropharynx clear. Neck: Supple, no elevated JVP or carotid bruits, no thyromegaly. Lungs: Clear to auscultation, nonlabored breathing at rest. Cardiac: Regular rate and rhythm, no S3 or significant systolic murmur, no pericardial rub. Abdomen: Soft, nontender, bowel sounds present. Extremities: No pitting edema, distal pulses 2+. Skin: Warm and dry. Musculoskeletal: No kyphosis. Neuropsychiatric: Alert and oriented x3, affect grossly appropriate.  ECG: I personally reviewed the tracing from 08/30/2018 which showed sinus rhythm with lead motion artifact.  Recent Labwork: 01/31/2018: TSH 1.34 09/19/2018: ALT 12; AST 10; BUN 13; Creat 1.21; Hemoglobin 13.7; Platelets 308; Potassium 4.6; Sodium 136     Component Value Date/Time   CHOL 108 09/19/2018 0845   TRIG 172 (H) 09/19/2018 0845   HDL 36 (L) 09/19/2018 0845   CHOLHDL 3.0 09/19/2018 0845   VLDL 39 (H) 05/14/2017 0831   LDLCALC 47 09/19/2018 0845   Assessment and Plan:  1.  History of chest pain in a 62 year old male with hypertension, type 2 diabetes mellitus, and hyperlipidemia.  ECG shows no specific changes from October, LDL 47 on statin therapy.  Although the symptoms he experienced based on discussion today sound like they could have been musculoskeletal and inflammatory, he has not undergone any prior ischemic evaluation and with his current pretest probability for CAD, plan is to obtain a [place] for screening.  2.  Essential hypertension, on Aldactone and Norvasc.  3.  Mixed hyperlipidemia on Pravachol with recent LDL 47.  4.  Type 2 diabetes mellitus on Glucotrol and Glucophage.  Hemoglobin A1c 8.9.  Current medicines were reviewed with the patient today.   Orders Placed This Encounter  Procedures  . NM Myocar Multi W/Spect W/Wall Motion / EF    Disposition: Call with test results.  Signed, Satira Sark, MD, [facility] 10/18/2018 9:23 AM    [facility]. [address], [place], [place] [address] Phone: [phone]; Fax: [phone]

## 2018-10-18 ENCOUNTER — Encounter: Payer: Self-pay | Admitting: Cardiology

## 2018-10-18 ENCOUNTER — Ambulatory Visit: Payer: Medicare HMO | Admitting: Cardiology

## 2018-10-18 VITALS — BP 124/82 | HR 90 | Ht 68.0 in | Wt 208.6 lb

## 2018-10-18 DIAGNOSIS — E1165 Type 2 diabetes mellitus with hyperglycemia: Secondary | ICD-10-CM | POA: Diagnosis not present

## 2018-10-18 DIAGNOSIS — I10 Essential (primary) hypertension: Secondary | ICD-10-CM

## 2018-10-18 DIAGNOSIS — R072 Precordial pain: Secondary | ICD-10-CM | POA: Diagnosis not present

## 2018-10-18 DIAGNOSIS — E782 Mixed hyperlipidemia: Secondary | ICD-10-CM | POA: Diagnosis not present

## 2018-10-18 NOTE — Patient Instructions (Signed)
Medication Instructions:  Your physician recommends that you continue on your current medications as directed. Please refer to the Current Medication list given to you today.   Labwork: none  Testing/Procedures: Your physician has requested that you have a lexiscan myoview. For further information please visit [email or web address]. Please follow instruction sheet, as given.    Follow-Up: Your physician recommends that you schedule a follow-up appointment in: as needed - will call with test results    Any Other Special Instructions Will Be Listed Below (If Applicable).     If you need a refill on your cardiac medications before your next appointment, please call your pharmacy.

## 2018-11-10 ENCOUNTER — Encounter (HOSPITAL_COMMUNITY): Payer: Medicare HMO

## 2018-11-22 ENCOUNTER — Ambulatory Visit: Payer: Medicare HMO | Admitting: Nutrition

## 2018-11-22 NOTE — Progress Notes (Deleted)
Diabetes Self-Management Education  Visit Type:  F/u  Appt. Start Time: 1400Appt. End Time: 1430  11/22/2018  [name], identified by name and date of birth, is a 63 y.o. male with a diagnosis of Diabetes:  Marland Kitchen            Sees Dr. Moshe Cipro.  Glipizide 10 mg in am and 5 mg pm Metformin 100o mg BID. Changes made:  Been exercising an hour a day using the treadmill. Has cut down on sweets, bread, and eaitng brown rice, drinking more water and increasing more fresh vegetables and fruit.  7 day avg 117 mg/dl, 14 day 142 RK/YH06 day 137 mg/dl. Making great progress. Last A1C 8.7% but should be much better next time. Feels better. He notes he tends to get some lower blood sugar before lunch and sometimes in the middle of the night.  Gained 2 lbs He would benefit from reducing Glipizide and increasing Metformin if kidney function is better for greater BS control and wt loss.  Lab Results  Component Value Date   HGBA1C 8.9 (H) 09/19/2018     ASSESSMENT  Wt Readings from Last 3 Encounters:  10/18/18 208 lb 9.6 oz (94.6 kg)  09/25/18 207 lb (93.9 kg)  08/30/18 206 lb 1.3 oz (93.5 kg)   Ht Readings from Last 3 Encounters:  10/18/18 5\' 8"  (1.727 m)  09/25/18 5\' 8"  (1.727 m)  08/30/18 5\' 8"  (1.727 m)   There is no height or weight on file to calculate BMI. @BMIFA @ Facility age limit for growth percentiles is 20 years. Facility age limit for growth percentiles is 20 years. Lipid Panel     Component Value Date/Time   CHOL 108 09/19/2018 0845   TRIG 172 (H) 09/19/2018 0845   HDL 36 (L) 09/19/2018 0845   CHOLHDL 3.0 09/19/2018 0845   VLDL 39 (H) 05/14/2017 0831   LDLCALC 47 09/19/2018 0845   CMP Latest Ref Rng & Units 09/19/2018 05/22/2018 01/31/2018  Glucose 65 - 99 mg/dL 199(H) 137 152(H)  BUN 7 - 25 mg/dL 13 18 16   Creatinine 0.70 - 1.25 mg/dL 1.21 1.27(H) 1.16  Sodium 135 - 146 mmol/L 136 135 136  Potassium 3.5 - 5.3 mmol/L 4.6 4.9 4.7  Chloride 98 - 110 mmol/L 99 99  103  CO2 20 - 32 mmol/L 29 27 28   Calcium 8.6 - 10.3 mg/dL 9.8 10.3 9.7  Total Protein 6.1 - 8.1 g/dL 7.1 - 7.1  Total Bilirubin 0.2 - 1.2 mg/dL 0.8 - 0.6  Alkaline Phos 40 - 115 U/L - - -  AST 10 - 35 U/L 10 - 10  ALT 9 - 46 U/L 12 - 11       Individualized Plan for Diabetes Self-Management Training:   Learning Objective:  Patient will have a greater understanding of diabetes self-management. Patient education plan is to attend individual and/or group sessions per assessed needs and concerns.   Plan:  Goals 1. Cut out sweetts 2. Increase exercise 3. Test twice a day, in am and before bed Get A1C down to 7% or less.   Expected Outcomes:    Improved self management of his DM.  Education material provided: Living Well with Diabetes, A1C conversion sheet, Meal plan card, My Plate and Carbohydrate counting sheet  If problems or questions, patient to contact team via:  Phone and Email  Future DSME appointment:  3 months

## 2018-11-23 ENCOUNTER — Encounter: Payer: Self-pay | Admitting: *Deleted

## 2018-11-29 DIAGNOSIS — E669 Obesity, unspecified: Secondary | ICD-10-CM | POA: Diagnosis not present

## 2018-11-29 DIAGNOSIS — I10 Essential (primary) hypertension: Secondary | ICD-10-CM | POA: Diagnosis not present

## 2018-11-29 DIAGNOSIS — E1169 Type 2 diabetes mellitus with other specified complication: Secondary | ICD-10-CM | POA: Diagnosis not present

## 2018-11-30 LAB — BASIC METABOLIC PANEL WITH GFR
BUN / CREAT RATIO: 11 (calc) (ref 6–22)
BUN: 17 mg/dL (ref 7–25)
CHLORIDE: 98 mmol/L (ref 98–110)
CO2: 28 mmol/L (ref 20–32)
Calcium: 9.7 mg/dL (ref 8.6–10.3)
Creat: 1.56 mg/dL — ABNORMAL HIGH (ref 0.70–1.25)
GFR, EST AFRICAN AMERICAN: 54 mL/min/{1.73_m2} — AB (ref >=60)
GFR, Est Non African American: 47 mL/min/{1.73_m2} — ABNORMAL LOW (ref >=60)
Glucose, Bld: 220 mg/dL — ABNORMAL HIGH (ref 65–139)
POTASSIUM: 4.6 mmol/L (ref 3.5–5.3)
Sodium: 137 mmol/L (ref 135–146)

## 2018-11-30 LAB — HEMOGLOBIN A1C
HEMOGLOBIN A1C: 9.1 %{Hb} — AB (ref <5.7)
MEAN PLASMA GLUCOSE: 214 (calc)
eAG (mmol/L): 11.9 (calc)

## 2018-12-11 ENCOUNTER — Encounter: Payer: Medicare HMO | Admitting: Family Medicine

## 2018-12-21 ENCOUNTER — Encounter: Payer: Medicare HMO | Admitting: Family Medicine

## 2019-01-04 ENCOUNTER — Ambulatory Visit (INDEPENDENT_AMBULATORY_CARE_PROVIDER_SITE_OTHER): Payer: Medicare HMO | Admitting: Family Medicine

## 2019-01-04 ENCOUNTER — Encounter: Payer: Self-pay | Admitting: Family Medicine

## 2019-01-04 VITALS — BP 120/84 | HR 90 | Temp 99.2°F | Resp 15 | Ht 68.0 in | Wt 207.0 lb

## 2019-01-04 DIAGNOSIS — E669 Obesity, unspecified: Secondary | ICD-10-CM

## 2019-01-04 DIAGNOSIS — Z0000 Encounter for general adult medical examination without abnormal findings: Secondary | ICD-10-CM | POA: Diagnosis not present

## 2019-01-04 DIAGNOSIS — I10 Essential (primary) hypertension: Secondary | ICD-10-CM | POA: Diagnosis not present

## 2019-01-04 DIAGNOSIS — J320 Chronic maxillary sinusitis: Secondary | ICD-10-CM | POA: Diagnosis not present

## 2019-01-04 DIAGNOSIS — E785 Hyperlipidemia, unspecified: Secondary | ICD-10-CM | POA: Diagnosis not present

## 2019-01-04 DIAGNOSIS — J40 Bronchitis, not specified as acute or chronic: Secondary | ICD-10-CM | POA: Diagnosis not present

## 2019-01-04 DIAGNOSIS — E1169 Type 2 diabetes mellitus with other specified complication: Secondary | ICD-10-CM

## 2019-01-04 LAB — COMPLETE METABOLIC PANEL WITH GFR
AG Ratio: 1.3 (calc) (ref 1.0–2.5)
ALBUMIN MSPROF: 4.2 g/dL (ref 3.6–5.1)
ALKALINE PHOSPHATASE (APISO): 78 U/L (ref 35–144)
ALT: 13 U/L (ref 9–46)
AST: 10 U/L (ref 10–35)
BILIRUBIN TOTAL: 0.4 mg/dL (ref 0.2–1.2)
BUN / CREAT RATIO: 15 (calc) (ref 6–22)
BUN: 19 mg/dL (ref 7–25)
CHLORIDE: 99 mmol/L (ref 98–110)
CO2: 28 mmol/L (ref 20–32)
Calcium: 10.2 mg/dL (ref 8.6–10.3)
Creat: 1.3 mg/dL — ABNORMAL HIGH (ref 0.70–1.25)
GFR, EST AFRICAN AMERICAN: 68 mL/min/{1.73_m2} (ref >=60)
GFR, Est Non African American: 58 mL/min/{1.73_m2} — ABNORMAL LOW (ref >=60)
GLOBULIN: 3.3 g/dL (ref 1.9–3.7)
GLUCOSE: 233 mg/dL — AB (ref 65–99)
Potassium: 4.7 mmol/L (ref 3.5–5.3)
Sodium: 136 mmol/L (ref 135–146)
TOTAL PROTEIN: 7.5 g/dL (ref 6.1–8.1)

## 2019-01-04 LAB — LIPID PANEL
Cholesterol: 112 mg/dL (ref <200)
HDL: 33 mg/dL — ABNORMAL LOW (ref >=40)
LDL CHOLESTEROL (CALC): 46 mg/dL
Non-HDL Cholesterol (Calc): 79 mg/dL (calc) (ref <130)
TRIGLYCERIDES: 313 mg/dL — AB (ref <150)
Total CHOL/HDL Ratio: 3.4 (calc) (ref <5.0)

## 2019-01-04 MED ORDER — GLIPIZIDE 10 MG PO TABS: 10.0000 mg | ORAL_TABLET | Freq: Two times a day (BID) | ORAL | 1 refills | Status: DC

## 2019-01-04 MED ORDER — AMLODIPINE BESYLATE 10 MG PO TABS: 10.0000 mg | ORAL_TABLET | Freq: Every day | ORAL | 1 refills | Status: DC

## 2019-01-04 MED ORDER — PRAVASTATIN SODIUM 10 MG PO TABS: 10.0000 mg | ORAL_TABLET | Freq: Every day | ORAL | 1 refills | Status: DC

## 2019-01-04 MED ORDER — SPIRONOLACTONE 50 MG PO TABS: 50.0000 mg | ORAL_TABLET | Freq: Every day | ORAL | 1 refills | Status: DC

## 2019-01-04 MED ORDER — PENICILLIN V POTASSIUM 500 MG PO TABS: 500.0000 mg | ORAL_TABLET | Freq: Three times a day (TID) | ORAL | 0 refills | Status: DC

## 2019-01-04 MED ORDER — FLUOXETINE HCL 20 MG PO CAPS: 20.0000 mg | ORAL_CAPSULE | Freq: Every day | ORAL | 1 refills | Status: DC

## 2019-01-04 NOTE — Progress Notes (Signed)
   Norman Peters     MRN: [id number]      DOB: April 28, 1956   HPI: Patient is in for annual physical exam. 1 week h/o head and chest congestion with light colored sputum and chest tightness, had been very fatigued Needs to schedule and keep appt with Endo who he was referred to and needs as blood sugar is uncontrolled, also needs to reschedule with Cardiology    PE; BP 120/84   Pulse 90   Temp 99.2 F (37.3 C) (Oral)   Resp 15   Ht 5\' 8"  (1.727 m)   Wt 207 lb (93.9 kg)   SpO2 96%   BMI 31.47 kg/m   Pleasant male, alert and oriented x 3, in no cardio-pulmonary distress. Afebrile. HEENT No facial trauma or asymetry. Sinuses non tender. EOMI, pupils equally reactive to light. External ears normal, tympanic membranes clear. Oropharynx moist, no exudate. Neck: supple, no adenopathy,JVD or thyromegaly.No bruits. Left maxillary sinus tender  Chest: Decreased though adequate air entry , scattered crackles in bases , no wheezes Non tender to palpation  Breast: No asymetry,no masses. No nipple discharge or inversion. No axillary or supraclavicular adenopathy  Cardiovascular system; Heart sounds normal,  S1 and  S2 ,no S3.  No murmur, or thrill. Apical beat not displaced Peripheral pulses normal.  Abdomen: Soft, non tender, no organomegaly or masses. No bruits. Bowel sounds normal. No guarding, tenderness or rebound.    Musculoskeletal exam: Full ROM of spine, hips , shoulders and knees. No deformity ,swelling or crepitus noted. No muscle wasting or atrophy.   Neurologic: Cranial nerves 2 to 12 intact. Power, tone ,sensation and reflexes normal throughout. No disturbance in gait. No tremor.  Skin: Intact, no ulceration, erythema , scaling or rash noted. Pigmentation normal throughout  Psych; Normal mood and affect. Judgement and concentration normal   Assessment & Plan:  Left maxillary sinusitis Penicillin prescribed  Annual physical exam Annual exam as  documented. Counseling done  re healthy lifestyle involving commitment to 150 minutes exercise per week, heart healthy diet, and attaining healthy weight.The importance of adequate sleep also discussed. Regular seat belt use and home safety, is also discussed. Changes in health habits are decided on by the patient with goals and time frames  set for achieving them. Immunization and cancer screening needs are specifically addressed at this visit.   Diabetes mellitus type 2 in obese ([facility]) Uncontrolled , needs to establish with Endo an has been referred in the past , still has not keep appt , needs to, will refer again  Bronchitis 2 week h/o symptoms , no improvement and debilitating, penicillin and decongestant prescribed

## 2019-01-04 NOTE — Assessment & Plan Note (Addendum)
Penicillin prescribed 

## 2019-01-04 NOTE — Patient Instructions (Signed)
F/U last week in April, call if you need me before   Please help pt with rescheduling stress test if able , he may get lab next door and return, if unable I have  Asked him to go to the Cardiology office today and reschedule  You are treated for left maxillary sinusitis and bronchitis , penicillin is prescribed for 1 week cmp , lipid and EGFR today   Non fast HBa1C, chem 7 and EGFR 4/23 or shortly after  It is important that you exercise regularly at least 30 minutes 5 times a week. If you develop chest pain, have severe difficulty breathing, or feel very tired, stop exercising immediately and seek medical attention  Thanks for choosing [facility], we consider it a privelige to serve you.  

## 2019-01-05 ENCOUNTER — Encounter: Payer: Self-pay | Admitting: Family Medicine

## 2019-01-05 NOTE — Assessment & Plan Note (Signed)

## 2019-01-05 NOTE — Assessment & Plan Note (Signed)
Uncontrolled , needs to establish with Endo an has been referred in the past , still has not keep appt , needs to, will refer again

## 2019-01-05 NOTE — Assessment & Plan Note (Signed)
2 week h/o symptoms , no improvement and debilitating, penicillin and decongestant prescribed

## 2019-01-08 ENCOUNTER — Encounter (HOSPITAL_COMMUNITY)
Admission: RE | Admit: 2019-01-08 | Discharge: 2019-01-08 | Disposition: A | Discharge status: Home / Self Care | Payer: Medicare HMO | Source: Ambulatory Visit | Attending: Cardiology | Admitting: Cardiology

## 2019-01-08 ENCOUNTER — Encounter (HOSPITAL_BASED_OUTPATIENT_CLINIC_OR_DEPARTMENT_OTHER)
Admission: RE | Admit: 2019-01-08 | Discharge: 2019-01-08 | Disposition: A | Discharge status: Home / Self Care | Payer: Medicare HMO | Source: Ambulatory Visit | Attending: Cardiology | Admitting: Cardiology

## 2019-01-08 DIAGNOSIS — R072 Precordial pain: Secondary | ICD-10-CM | POA: Insufficient documentation

## 2019-01-08 LAB — NM MYOCAR MULTI W/SPECT W/WALL MOTION / EF
CHL CUP RESTING HR STRESS: 81 {beats}/min
LV dias vol: 78 mL (ref 62–150)
LVSYSVOL: 38 mL
Peak HR: 114 {beats}/min
RATE: 0.43
SDS: 5
SRS: 2
SSS: 7
TID: 1.05

## 2019-01-08 MED ORDER — TECHNETIUM TC 99M TETROFOSMIN IV KIT
10.0000 | PACK | Freq: Once | INTRAVENOUS | Status: AC | PRN
  Administered 2019-01-08: 10.83 via INTRAVENOUS

## 2019-01-08 MED ORDER — TECHNETIUM TC 99M TETROFOSMIN IV KIT
30.0000 | PACK | Freq: Once | INTRAVENOUS | Status: AC | PRN
  Administered 2019-01-08: 30.22 via INTRAVENOUS

## 2019-01-08 MED ORDER — REGADENOSON 0.4 MG/5ML IV SOLN
INTRAVENOUS | Status: AC
  Administered 2019-01-08: 0.4 mg via INTRAVENOUS
  Filled 2019-01-08: qty 5

## 2019-01-08 MED ORDER — SODIUM CHLORIDE 0.9% FLUSH
INTRAVENOUS | Status: AC
  Administered 2019-01-08: 10 mL via INTRAVENOUS
  Filled 2019-01-08: qty 10

## 2019-01-16 ENCOUNTER — Encounter: Payer: Self-pay | Admitting: Family Medicine

## 2019-01-16 ENCOUNTER — Telehealth: Payer: Self-pay

## 2019-01-16 ENCOUNTER — Ambulatory Visit (HOSPITAL_COMMUNITY)
Admission: RE | Admit: 2019-01-16 | Discharge: 2019-01-16 | Disposition: A | Discharge status: Home / Self Care | Payer: Medicare HMO | Source: Ambulatory Visit | Attending: Family Medicine | Admitting: Family Medicine

## 2019-01-16 ENCOUNTER — Ambulatory Visit (INDEPENDENT_AMBULATORY_CARE_PROVIDER_SITE_OTHER): Payer: Medicare HMO | Admitting: Family Medicine

## 2019-01-16 VITALS — BP 140/84 | HR 93 | Temp 99.4°F | Resp 14 | Ht 68.0 in | Wt 207.1 lb

## 2019-01-16 DIAGNOSIS — R509 Fever, unspecified: Secondary | ICD-10-CM | POA: Diagnosis not present

## 2019-01-16 DIAGNOSIS — R05 Cough: Secondary | ICD-10-CM | POA: Diagnosis not present

## 2019-01-16 DIAGNOSIS — R0989 Other specified symptoms and signs involving the circulatory and respiratory systems: Secondary | ICD-10-CM | POA: Insufficient documentation

## 2019-01-16 MED ORDER — GUAIFENESIN ER 600 MG PO TB12: 600.0000 mg | ORAL_TABLET | Freq: Two times a day (BID) | ORAL | 0 refills | Status: AC

## 2019-01-16 MED ORDER — DEXTROMETHORPHAN POLISTIREX ER 30 MG/5ML PO SUER: 60.0000 mg | Freq: Two times a day (BID) | ORAL | 0 refills | Status: DC

## 2019-01-16 NOTE — Patient Instructions (Signed)
    Thank you for coming into the office today. I appreciate the opportunity to provide you with the care for your health and wellness.   Please get xray today. Will call in medication pending the results.  Please pick up medications today at pharmacy and start them.  If xray normal, and still not well by Thursday call back.   DRINKS LOTS OF WATER.  It was a pleasure to see you and I look forward to continuing to work together on your health and well-being. Please do not hesitate to call the office if you need care or have questions about your care.  Have a wonderful day and week.  With Gratitude,  Cherly Beach, DNP, AGNP-BC

## 2019-01-16 NOTE — Progress Notes (Signed)
Acute Office Visit  Subjective:    Patient ID: Norman Peters, male    DOB: 1956-01-22, 63 y.o.   MRN: [id number]  Chief Complaint  Patient presents with  . Cough    since monday night  . Wheezing    HPI Norman Peters is a 63 year old male patient of Dr. Griffin Dakin who presents today with ongoing cough secondary to previous sinusitis.  Norman Peters reports that Monday night he started having cough, With wheezing.  The cough has been ongoing for 2 days.  Reports that he might be being a little bit better today.  Reports that this cough started about 3 to 5 days after he stopped taking penicillin for which she was receiving and taking secondary to having a sinusitis infection.  Reports that her feelings in wooded areas are making it a little bit worse.  Reports that Hall's cough drops have helped.  He finished taking the cough syrup he had previously.  And finish the penicillin in full course.  Reports that he was feeling a little bit better before he got sick again.  Cough is worse at night.  But it does occur throughout the day.  Discomfort has 6 out of 10 coughing.  Reports that he is not feeling as tired as he was.  Reports that he has white to clear discharge from his nasal passages and chest when he coughs.  Biggest complaint today is the ongoing cough.  He denies feeling feverish and chilly. Denies having chest pain, palpitations, leg swelling, wheezing, shortness of breath.  Does report having some left-sided mild facial comfort in the maxillary sinuses.  But overall feels much improved compared to where he was.     Past Medical History:  Diagnosis Date  . Anxiety   . Arthritis   . Depression 2001  . Diabetes mellitus 2010  . GERD (gastroesophageal reflux disease)   . Hyperlipidemia   . Hypertension 2005    Past Surgical History:  Procedure Laterality Date  . COLONOSCOPY N/A 07/07/2016   Procedure: COLONOSCOPY;  Surgeon: Rogene Houston, MD;  Location: [facility];  Service:  Endoscopy;  Laterality: N/A;  1030  . FINGER SURGERY Left 1974   fifth , trauma on job, had to replace/repair finger  . HERNIA REPAIR  6440   x3, umbilical and bilateral hernia    Family History  Problem Relation Age of Onset  . Stroke Mother     Social History   Socioeconomic History  . Marital status: Married    Spouse name: Not on file  . Number of children: Not on file  . Years of education: Not on file  . Highest education level: Not on file  Occupational History  . Not on file  Social Needs  . Financial resource strain: Not on file  . Food insecurity:    Worry: Not on file    Inability: Not on file  . Transportation needs:    Medical: Not on file    Non-medical: Not on file  Tobacco Use  . Smoking status: Former Smoker    Last attempt to quit: 01/20/2011    Years since quitting: 7.9  . Smokeless tobacco: Never Used  Substance and Sexual Activity  . Alcohol use: No  . Drug use: No  . Sexual activity: Not Currently  Lifestyle  . Physical activity:    Days per week: Not on file    Minutes per session: Not on file  . Stress: Not on file  Relationships  .  Social connections:    Talks on phone: Not on file    Gets together: Not on file    Attends religious service: Not on file    Active member of club or organization: Not on file    Attends meetings of clubs or organizations: Not on file    Relationship status: Not on file  . Intimate partner violence:    Fear of current or ex partner: Not on file    Emotionally abused: Not on file    Physically abused: Not on file    Forced sexual activity: Not on file  Other Topics Concern  . Not on file  Social History Narrative  . Not on file    Outpatient Medications Prior to Visit  Medication Sig Dispense Refill  . amLODipine (NORVASC) 10 MG tablet Take 1 tablet (10 mg total) by mouth daily. 90 tablet 1  . aspirin EC 81 MG tablet Take 81 mg by mouth daily.    . busPIRone (BUSPAR) 5 MG tablet Take 1 tablet (5 mg  total) by mouth 2 (two) times daily. 180 tablet 1  . Cholecalciferol (VITAMIN D3) 400 units CAPS Take 2 capsules by mouth daily.    Marland Kitchen FLUoxetine (PROZAC) 10 MG capsule TAKE 1 CAPSULE EVERY DAY (DOSE INCREASE, 30 MG TOTAL) 90 capsule 3  . FLUoxetine (PROZAC) 20 MG capsule Take 1 capsule (20 mg total) by mouth daily. 90 capsule 1  . glipiZIDE (GLUCOTROL) 10 MG tablet Take 1 tablet (10 mg total) by mouth 2 (two) times daily before a meal. 180 tablet 1  . glucose blood (TRUE METRIX BLOOD GLUCOSE TEST) test strip USE  TO  TEST ONE TIME DAILY 100 each 3  . metFORMIN (GLUCOPHAGE) 1000 MG tablet TAKE 1 TABLET TWICE DAILY WITH A MEAL 180 tablet 1  . pravastatin (PRAVACHOL) 10 MG tablet Take 1 tablet (10 mg total) by mouth daily. 90 tablet 1  . spironolactone (ALDACTONE) 50 MG tablet Take 1 tablet (50 mg total) by mouth daily. 90 tablet 1  . traMADol (ULTRAM) 50 MG tablet TAKE 1 TABLET BY MOUTH ONCE DAILY AS NEEDED FOR  UNCONTROLLED  JOINT  PAIN 30 tablet 0  . TRUEPLUS LANCETS 28G MISC USE  TO  TEST ONE TIME DAILY 100 each 3  . penicillin v potassium (VEETID) 500 MG tablet Take 1 tablet (500 mg total) by mouth 3 (three) times daily. (Patient not taking: Reported on 01/16/2019) 21 tablet 0   No facility-administered medications prior to visit.     Allergies  Allergen Reactions  . Ibuprofen Rash  . Lisinopril Cough  . Other Other (See Comments)  . Strawberry [Berry]     Review of Systems  Constitutional: Negative for chills, fever and malaise/fatigue.  HENT: Positive for congestion and sinus pain. Negative for ear discharge, ear pain, hearing loss and sore throat.        Hoarseness   Eyes: Negative for pain and discharge.  Respiratory: Positive for cough and sputum production. Negative for shortness of breath and wheezing.   Cardiovascular: Negative for chest pain, palpitations and leg swelling.  Gastrointestinal: Negative.  Negative for heartburn.  Musculoskeletal: Negative.   Neurological:  Negative for dizziness, weakness and headaches.  Psychiatric/Behavioral: Negative.   All other systems reviewed and are negative.      Objective:    Physical Exam  Constitutional: He is oriented to person, place, and time. He appears well-developed and well-nourished.  HENT:  Head: Normocephalic.  Right Ear: External ear normal.  Left Ear: External ear normal.  Nose: Nose normal.  Mouth/Throat: Oropharynx is clear and moist.  Eyes: Pupils are equal, round, and reactive to light. Conjunctivae are normal. Right eye exhibits no discharge. Left eye exhibits no discharge.  Neck: Normal range of motion. Neck supple.  Cardiovascular: Normal rate, regular rhythm and normal heart sounds.  Pulmonary/Chest: Effort normal. No accessory muscle usage. No respiratory distress. He has decreased breath sounds in the right lower field and the left lower field. Chest wall is not dull to percussion. He exhibits no mass, no tenderness and no bony tenderness.  Musculoskeletal: Normal range of motion.  Lymphadenopathy:    He has no cervical adenopathy.  Neurological: He is alert and oriented to person, place, and time.  Skin: Skin is warm and dry.  Psychiatric: He has a normal mood and affect. His behavior is normal. Judgment and thought content normal.  Nursing note and vitals reviewed.   BP 140/84   Pulse 93   Resp 14   Ht 5\' 8"  (1.727 m)   Wt 207 lb 1.9 oz (93.9 kg)   SpO2 97% Comment: room air  BMI 31.49 kg/m  Wt Readings from Last 3 Encounters:  01/16/19 207 lb 1.9 oz (93.9 kg)  01/04/19 207 lb (93.9 kg)  10/18/18 208 lb 9.6 oz (94.6 kg)       Assessment & Plan:   1. Chest congestion Beginning chest congestion and cough today here in the office.  Concern for secondary infection.  Grade fever today.  Did educate on can linger after having sinus infection, or respiratory infections.  As these can take 7 to 21 days to clear depending on immune system infection level.  Educated that this  could just be a postinfectious cough.  Will check x-ray to make sure that there is no new onset of pneumonia.  Will prescribe medication accordingly.  Educated to drink lots of water to help thin out secretions.  To take Mucinex and Delsym as needed.  - guaiFENesin (MUCINEX) 600 MG 12 hr tablet; Take 1 tablet (600 mg total) by mouth 2 (two) times daily for 10 days.  Dispense: 20 tablet; Refill: 0 - dextromethorphan (DELSYM) 30 MG/5ML liquid; Take 10 mLs (60 mg total) by mouth 2 (two) times daily.  Dispense: 89 mL; Refill: 0 - DG Chest 2 View  2. Fever in adult  Was on penicillin recently for sinusitis.  Reports completing this in full.  Reports new onset cough about 5 days after completing penicillin course.  Denies feeling feverish and chilly at home.  But does have a low-grade fever today here in the office.  Concerned for secondary infection.  Will be getting x-ray as cough here in the office is rather congested and chesty sounding.  Addendum: Chest xray negative    DG Chest 2 View   Follow-up: As needed.   Perlie Mayo, NP

## 2019-01-16 NOTE — Telephone Encounter (Signed)
Called patient to let him know that he could pick up all the scripts except the antibiotic because we don't have the xray results back yet. He verbalized understanding.

## 2019-02-08 ENCOUNTER — Ambulatory Visit: Payer: Medicare HMO | Admitting: "Endocrinology

## 2019-02-09 ENCOUNTER — Ambulatory Visit: Payer: Medicare HMO | Admitting: "Endocrinology

## 2019-02-21 ENCOUNTER — Encounter: Payer: Self-pay | Admitting: *Deleted

## 2019-02-28 ENCOUNTER — Other Ambulatory Visit: Payer: Self-pay | Admitting: Family Medicine

## 2019-02-28 DIAGNOSIS — F419 Anxiety disorder, unspecified: Principal | ICD-10-CM

## 2019-02-28 DIAGNOSIS — F32A Depression, unspecified: Secondary | ICD-10-CM

## 2019-02-28 DIAGNOSIS — F329 Major depressive disorder, single episode, unspecified: Secondary | ICD-10-CM

## 2019-03-07 ENCOUNTER — Ambulatory Visit (INDEPENDENT_AMBULATORY_CARE_PROVIDER_SITE_OTHER): Payer: Medicare HMO | Admitting: Family Medicine

## 2019-03-07 ENCOUNTER — Other Ambulatory Visit: Payer: Self-pay

## 2019-03-07 ENCOUNTER — Encounter: Payer: Self-pay | Admitting: Family Medicine

## 2019-03-07 DIAGNOSIS — R059 Cough, unspecified: Secondary | ICD-10-CM

## 2019-03-07 DIAGNOSIS — I10 Essential (primary) hypertension: Secondary | ICD-10-CM | POA: Diagnosis not present

## 2019-03-07 DIAGNOSIS — J302 Other seasonal allergic rhinitis: Secondary | ICD-10-CM

## 2019-03-07 DIAGNOSIS — R05 Cough: Secondary | ICD-10-CM

## 2019-03-07 NOTE — Progress Notes (Signed)
Virtual Visit via Telephone Note   This visit type was conducted due to national recommendations for restrictions regarding the COVID-19 Pandemic (e.g. social distancing) in an effort to limit this patient's exposure and mitigate transmission in our community.  Due to his co-morbid illnesses, this patient is at least at moderate risk for complications without adequate follow up.  This format is felt to be most appropriate for this patient at this time.  The patient did not have access to video technology/had technical difficulties with video requiring transitioning to audio format only (telephone).  All issues noted in this document were discussed and addressed.  No physical exam could be performed with this format.    Evaluation Performed:  Follow-up visit  Date:  03/07/2019   ID:  Norman Peters, DOB May 01, 1956, MRN [id number]  Patient Location: Home Provider Location: Other:  Telemedicine  Location of Patient: Home Location of Provider: Telehealth Consent was obtain for visit to be over via telehealth. I verified that I am speaking with the correct person using two identifiers.  PCP:  Norman Helper, MD   Chief Complaint:  Follow Up Chronic Conditions/Allergies  History of Present Illness:    Norman Peters is a 63 y.o. male with history of hypertension, GERD, type 2 diabetes, and obesity.  Norman Peters is a patient of Norman Peters who presents today for review of some chronic conditions along with possible ongoing allergy, cough symptoms.  He reports that he is taking his medication well and without issue.  He was recently treated several times this year secondary to sinus and cough issues.  Reports that he still taking the cough syrup as needed.  Reports that he thinks it might be allergy related.  But he is not taking anything for allergies.  There is also question as to whether or not it could be acid reflux related.  But he reports that it usually happens in the morning time  when he wakes up.  He is not waking up with it as much at night.  Hypertension: Reports that he is taking his medication well and without issue.  Denies having any vision changes, headache, chest pain, palpitations, leg swelling.  Is currently maintained on Norvasc, aspirin, Aldactone, is on statin.  Type 2 diabetes: Reports that he is taking his medication as directed.  Does not have any issues or concerns at this time with hypoglycemia, probably did see a, polyphagia, polyuria.  Currently maintained on glipizide, metformin.  Obesity: Reports that he is not really watching what he eats at this time.  But he is trying to maintain his diet so that his blood sugar stays in control.  Overall Norman Peters reports that he is doing well.  His wife is made him a mask so that when they go out they are safe.  He is trying to continue to practice social distancing as he can.  Denies having any signs or symptoms of infection today on the telephone.  The patient does not have symptoms concerning for COVID-19 infection (fever, chills, cough, or new shortness of breath).   Past Medical, Surgical, Social History, Allergies, and Medications have been Reviewed.   Past Medical History:  Diagnosis Date  . Anxiety   . Arthritis   . Depression 2001  . Diabetes mellitus 2010  . GERD (gastroesophageal reflux disease)   . Hyperlipidemia   . Hypertension 2005   Past Surgical History:  Procedure Laterality Date  . COLONOSCOPY N/A 07/07/2016   Procedure:  COLONOSCOPY;  Surgeon: Rogene Houston, MD;  Location: [facility];  Service: Endoscopy;  Laterality: N/A;  1030  . FINGER SURGERY Left 1974   fifth , trauma on job, had to replace/repair finger  . HERNIA REPAIR  9628   x3, umbilical and bilateral hernia     Current Meds  Medication Sig  . amLODipine (NORVASC) 10 MG tablet Take 1 tablet (10 mg total) by mouth daily.  Marland Kitchen aspirin EC 81 MG tablet Take 81 mg by mouth daily.  . busPIRone (BUSPAR) 5 MG tablet  TAKE 1 TABLET TWICE DAILY  . Cholecalciferol (VITAMIN D3) 400 units CAPS Take 2 capsules by mouth daily.  Marland Kitchen dextromethorphan (DELSYM) 30 MG/5ML liquid Take 10 mLs (60 mg total) by mouth 2 (two) times daily.  Marland Kitchen FLUoxetine (PROZAC) 10 MG capsule TAKE 1 CAPSULE EVERY DAY (DOSE INCREASE, 30 MG TOTAL)  . FLUoxetine (PROZAC) 20 MG capsule Take 1 capsule (20 mg total) by mouth daily.  Marland Kitchen glipiZIDE (GLUCOTROL) 10 MG tablet Take 1 tablet (10 mg total) by mouth 2 (two) times daily before a meal.  . glucose blood (TRUE METRIX BLOOD GLUCOSE TEST) test strip USE  TO  TEST ONE TIME DAILY  . metFORMIN (GLUCOPHAGE) 1000 MG tablet TAKE 1 TABLET TWICE DAILY WITH A MEAL  . pravastatin (PRAVACHOL) 10 MG tablet Take 1 tablet (10 mg total) by mouth daily.  Marland Kitchen spironolactone (ALDACTONE) 50 MG tablet Take 1 tablet (50 mg total) by mouth daily.  . traMADol (ULTRAM) 50 MG tablet TAKE 1 TABLET BY MOUTH ONCE DAILY AS NEEDED FOR  UNCONTROLLED  JOINT  PAIN  . TRUEPLUS LANCETS 28G MISC USE  TO  TEST ONE TIME DAILY     Allergies:   Ibuprofen; Lisinopril; Other; and Strawberry [berry]   Social History   Tobacco Use  . Smoking status: Former Smoker    Last attempt to quit: 01/20/2011    Years since quitting: 8.1  . Smokeless tobacco: Never Used  Substance Use Topics  . Alcohol use: No  . Drug use: No     Family Hx: The patient's family history includes Stroke in his mother.  ROS:   Please see the history of present illness.    All other systems reviewed and are negative.  Labs/Other Tests and Data Reviewed:    Recent Labs: 09/19/2018: Hemoglobin 13.7; Platelets 308 01/04/2019: ALT 13; BUN 19; Creat 1.30; Potassium 4.7; Sodium 136   Recent Lipid Panel Lab Results  Component Value Date/Time   CHOL 112 01/04/2019 10:09 AM   TRIG 313 (H) 01/04/2019 10:09 AM   HDL 33 (L) 01/04/2019 10:09 AM   CHOLHDL 3.4 01/04/2019 10:09 AM   LDLCALC 46 01/04/2019 10:09 AM    Wt Readings from Last 3 Encounters:  01/16/19  207 lb 1.9 oz (93.9 kg)  01/04/19 207 lb (93.9 kg)  10/18/18 208 lb 9.6 oz (94.6 kg)     Objective:    Vital Signs:  There were no vitals taken for this visit.   GEN:  alert RESPIRATORY:  no shortness of breath noted in conversation  PSYCH:  normal affect  Good communication, memory intact   ASSESSMENT & PLAN:    1. Hypertension goal BP (blood pressure) < 130/80 Reports no issues with taking blood pressure medicine.  Reports no issues with having signs and symptoms of elevated BP or out-of-control BP.  We will continue current medication regime.  We will look to reassess this in the office in the coming months.  2. Seasonal allergies Possible development of seasonal allergies.  As he has maintained a cough throughout most of the season.  Being treated with sinusitis earlier this year.  He reports that he still has a cough.  Predominantly he has it sometimes in the morning secondary to drainage in the back of his throat.  Educated him on the use of Claritin and/or Zyrtec for this.  Does not really want to take a medication at this time.  Reports that he has not managed currently.  3. Cough Reports that he still has intermittent ongoing cough.  But thinks that is secondary to allergies.  Educated that this could be related to acid reflux as well.  He is not currently taking anything but does have a history of having GERD.  We will continue to monitor this if the cough continues past the seasonal season and he still has it or starts waking him up more at night will consider putting him on something like a PPI to see if that would help.   Time:   Today, I have spent 10 minutes with the patient with telehealth technology discussing the above problems.     Medication Adjustments/Labs and Tests Ordered: Current medicines are reviewed at length with the patient today.  Concerns regarding medicines are outlined above.   Tests Ordered: No orders of the defined types were placed in this  encounter.   Medication Changes: No orders of the defined types were placed in this encounter.   Disposition:  Follow up in 4 month(s)  Signed, Perlie Mayo, NP  03/07/2019 9:07 AM     [place]

## 2019-03-07 NOTE — Patient Instructions (Addendum)
Thank you for completing your visit via telephone today. I appreciate the opportunity to provide you with the care for your health and wellness. Today we discussed: Overall health and wellness, continued cough, questionable seasonal allergies.  In discussion today we talked about how your cough is kind of been ongoing and continuous.  Whether or not it is related to allergies we are unsure.  You also have a history of having GERD, acid reflux.  Sometimes the only symptom of GERD can be a cough that is chronic.  You can read the attached information and see if some of your symptoms might coincide with that.  I have also attached some information about allergies so that she can kind of help remove those triggers for the cough if possible.  If either of these seem to do the trick let us know so that we can know what might be causing your cough to continue.  We will have you come back into the office in 4 months.  If you need [place] before then please do not hesitate to reach out to [place] where here for you.  Please continue to wear your mask, and practice social distancing during this time.  We are not fully over the virus and we would like to continue to practice measures to keep our community safe.   [place] YOUR HANDS WELL AND FREQUENTLY. AVOID TOUCHING YOUR FACE, UNLESS YOUR HANDS ARE FRESHLY WASHED.  GET FRESH AIR DAILY. STAY HYDRATED WITH WATER.   It was a pleasure to see you and I look forward to continuing to work together on your health and well-being. Please do not hesitate to call the office if you need care or have questions about your care.  Have a wonderful day and week. With Gratitude, Cherly Beach, DNP, AGNP-BC  Allergies can cause a lot of symptoms: watery, itching eyes, runny nose (clear), sneezing, sinus pressure, and headaches. This is not making you contagious to others. You can not spread to others or catch this from others. These symptoms happen after you have been exposed to  something that you are allergic to an allergen. Prevention: The best prevention is to avoid the things that you know you are allergic to, for example smoke (cigarette, cigar, wood); pollens and molds; animal dander; dust mites. And indoor inhalants such as cleaning products or aerosol sprays.  Target your bedroom as allergy free by removing carpets, damp mopping floors weekly, hanging washable curtains instead of blinds, removing books and stuffed animals, using foam pillows, and encasing pillows and mattress in plastic. Do not blow your nose too frequently or too hard. It may cause your eardrum to perforate (tear). Blow through both nostrils at the same time to equalize pressure.  Use tissue when you blow your nose. Dispose of them and then wash your hands. If no tissue is available, do the "elbow sneeze" into the bend of your arm (away from your open hands). Always wash your hands.  If able use the [facility] in the house and car to reduce exposure to pollens. Use an air filtration system in your house or buy a small one for your bedroom. Dust your house often, using a cloth and cleaner or polish that keeps the dust from flying into the air. Allergy testing can be done if you have had allergies for a long time and are not doing well on current treatments. Take your medications as directed. If you find the medications are not working let your healthcare provider  know. It might take more than one medication to control allergies, especially, seasonal ones.     Gastroesophageal Reflux Disease, Adult Gastroesophageal reflux (GER) happens when acid from the stomach flows up into the tube that connects the mouth and the stomach (esophagus). Normally, food travels down the esophagus and stays in the stomach to be digested. With GER, food and stomach acid sometimes move back up into the esophagus. You may have a disease called gastroesophageal reflux disease (GERD) if the reflux:  Happens often.  Causes frequent or  very bad symptoms.  Causes problems such as damage to the esophagus. When this happens, the esophagus becomes sore and swollen (inflamed). Over time, GERD can make small holes (ulcers) in the lining of the esophagus. What are the causes? This condition is caused by a problem with the muscle between the esophagus and the stomach. When this muscle is weak or not normal, it does not close properly to keep food and acid from coming back up from the stomach. The muscle can be weak because of:  Tobacco use.  Pregnancy.  Having a certain type of hernia (hiatal hernia).  Alcohol use.  Certain foods and drinks, such as coffee, chocolate, onions, and peppermint. What increases the risk? You are more likely to develop this condition if you:  Are overweight.  Have a disease that affects your connective tissue.  Use NSAID medicines. What are the signs or symptoms? Symptoms of this condition include:  Heartburn.  Difficult or painful swallowing.  The feeling of having a lump in the throat.  A bitter taste in the mouth.  Bad breath.  Having a lot of saliva.  Having an upset or bloated stomach.  Belching.  Chest pain. Different conditions can cause chest pain. Make sure you see your doctor if you have chest pain.  Shortness of breath or noisy breathing (wheezing).  Ongoing (chronic) cough or a cough at night.  Wearing away of the surface of teeth (tooth enamel).  Weight loss. How is this treated? Treatment will depend on how bad your symptoms are. Your doctor may suggest:  Changes to your diet.  Medicine.  Surgery. Follow these instructions at home: Eating and drinking   Follow a diet as told by your doctor. You may need to avoid foods and drinks such as: ? Coffee and tea (with or without caffeine). ? Drinks that contain alcohol. ? Energy drinks and sports drinks. ? Bubbly (carbonated) drinks or sodas. ? Chocolate and cocoa. ? Peppermint and mint flavorings. ?  Garlic and onions. ? Horseradish. ? Spicy and acidic foods. These include peppers, chili powder, curry powder, vinegar, hot sauces, and BBQ sauce. ? Citrus fruit juices and citrus fruits, such as oranges, lemons, and limes. ? Tomato-based foods. These include red sauce, chili, salsa, and pizza with red sauce. ? Fried and fatty foods. These include donuts, french fries, potato chips, and high-fat dressings. ? High-fat meats. These include hot dogs, rib eye steak, sausage, ham, and bacon. ? High-fat dairy items, such as whole milk, butter, and cream cheese.  Eat small meals often. Avoid eating large meals.  Avoid drinking large amounts of liquid with your meals.  Avoid eating meals during the 2-3 hours before bedtime.  Avoid lying down right after you eat.  Do not exercise right after you eat. Lifestyle   Do not use any products that contain nicotine or tobacco. These include cigarettes, e-cigarettes, and chewing tobacco. If you need help quitting, ask your doctor.  Try to lower  your stress. If you need help doing this, ask your doctor.  If you are overweight, lose an amount of weight that is healthy for you. Ask your doctor about a safe weight loss goal. General instructions  Pay attention to any changes in your symptoms.  Take over-the-counter and prescription medicines only as told by your doctor. Do not take aspirin, ibuprofen, or other NSAIDs unless your doctor says it is okay.  Wear loose clothes. Do not wear anything tight around your waist.  Raise (elevate) the head of your bed about 6 inches (15 cm).  Avoid bending over if this makes your symptoms worse.  Keep all follow-up visits as told by your doctor. This is important. Contact a doctor if:  You have new symptoms.  You lose weight and you do not know why.  You have trouble swallowing or it hurts to swallow.  You have wheezing or a cough that keeps happening.  Your symptoms do not get better with treatment.   You have a hoarse voice. Get help right away if:  You have pain in your arms, neck, jaw, teeth, or back.  You feel sweaty, dizzy, or light-headed.  You have chest pain or shortness of breath.  You throw up (vomit) and your throw-up looks like blood or coffee grounds.  You pass out (faint).  Your poop (stool) is bloody or black.  You cannot swallow, drink, or eat. Summary  If a person has gastroesophageal reflux disease (GERD), food and stomach acid move back up into the esophagus and cause symptoms or problems such as damage to the esophagus.  Treatment will depend on how bad your symptoms are.  Follow a diet as told by your doctor.  Take all medicines only as told by your doctor. This information is not intended to replace advice given to you by your health care provider. Make sure you discuss any questions you have with your health care provider. Document Released: 04/12/2008 Document Revised: 05/03/2018 Document Reviewed: 05/03/2018 Elsevier Interactive Patient Education  2019 [employer].

## 2019-03-15 ENCOUNTER — Ambulatory Visit: Payer: Medicare HMO | Admitting: "Endocrinology

## 2019-03-26 ENCOUNTER — Ambulatory Visit: Payer: Medicare HMO

## 2019-03-26 ENCOUNTER — Ambulatory Visit: Payer: Medicare HMO | Admitting: Family Medicine

## 2019-03-27 ENCOUNTER — Ambulatory Visit: Payer: Medicare HMO | Admitting: Family Medicine

## 2019-03-28 ENCOUNTER — Encounter: Payer: Self-pay | Admitting: Family Medicine

## 2019-03-28 ENCOUNTER — Ambulatory Visit (INDEPENDENT_AMBULATORY_CARE_PROVIDER_SITE_OTHER): Payer: Medicare HMO | Admitting: Family Medicine

## 2019-03-28 VITALS — BP 140/84 | HR 93 | Temp 99.4°F | Resp 14 | Ht 68.0 in | Wt 207.0 lb

## 2019-03-28 DIAGNOSIS — N529 Male erectile dysfunction, unspecified: Secondary | ICD-10-CM

## 2019-03-28 DIAGNOSIS — Z0000 Encounter for general adult medical examination without abnormal findings: Secondary | ICD-10-CM

## 2019-03-28 MED ORDER — TADALAFIL 10 MG PO TABS: 10.0000 mg | ORAL_TABLET | Freq: Every day | ORAL | 1 refills | Status: DC | PRN

## 2019-03-28 NOTE — Progress Notes (Signed)
Subjective:   Norman Peters is a 63 y.o. male who presents for Medicare Annual/Subsequent preventive examination.  Location of Patient: Home Location of Provider: Telehealth Consent was obtain for visit to be over via telehealth. I verified that I am speaking with the correct person using two identifiers.   HPI: Norman Peters is a 63 year old male patient of Dr. Griffin Dakin.  He reports today that overall he is doing well.  For this annual wellness visit.  He does have one concern that he would like to discuss today and that is possible erectile dysfunction.  He reports that he is having trouble becoming aroused, with interaction with his wife.  And would like to see if he can get some help with that.  Reported that he has not taken anything in the past for this.  But has thought about Cialis.  And would like to try this if possible.  Overall denies having any signs and symptoms of cold, cough, fever, chills, chest pain, shortness of breath.  Denies having any headaches, dizziness, weakness, leg swelling.  Review of Systems:   Advanced age, male gender, hypertension, diabetes, obesity BMI greater than 30, dyslipidemia, sedentary lifestyle, history of smoking and tobacco exposure.      Objective:    Vitals: BP 140/84   Pulse 93   Temp 99.4 F (37.4 C)   Resp 14   Ht 5\' 8"  (1.727 m)   Wt 207 lb (93.9 kg)   BMI 31.47 kg/m   Body mass index is 31.47 kg/m.  Advanced Directives 07/07/2017 05/25/2017 07/07/2016  Does Patient Have a Medical Advance Directive? No No No  Would patient like information on creating a medical advance directive? - Yes (Inpatient - patient defers creating a medical advance directive at this time) No - patient declined information    Tobacco Social History   Tobacco Use  Smoking Status Former Smoker  . Last attempt to quit: 01/20/2011  . Years since quitting: 8.1  Smokeless Tobacco Never Used     Counseling given: Yes   Clinical Intake:  Pre-visit  preparation completed: Yes  Pain : No/denies pain Pain Score: 0-No pain     BMI - recorded: 31.47 Nutritional Status: BMI > 30  Obese Nutritional Risks: None Diabetes: Yes CBG done?: No Did pt. bring in CBG monitor from home?: No  How often do you need to have someone help you when you read instructions, pamphlets, or other written materials from your doctor or pharmacy?: 1 - Never What is the last grade level you completed in school?: 10  Interpreter Needed?: No     Past Medical History:  Diagnosis Date  . Anxiety   . Arthritis   . Depression 2001  . Diabetes mellitus 2010  . GERD (gastroesophageal reflux disease)   . Hyperlipidemia   . Hypertension 2005   Past Surgical History:  Procedure Laterality Date  . COLONOSCOPY N/A 07/07/2016   Procedure: COLONOSCOPY;  Surgeon: Rogene Houston, MD;  Location: [facility];  Service: Endoscopy;  Laterality: N/A;  1030  . FINGER SURGERY Left 1974   fifth , trauma on job, had to replace/repair finger  . HERNIA REPAIR  6433   x3, umbilical and bilateral hernia   Family History  Problem Relation Age of Onset  . Stroke Mother    Social History   Socioeconomic History  . Marital status: Married    Spouse name: Not on file  . Number of children: Not on file  . Years of  education: Not on file  . Highest education level: Not on file  Occupational History  . Not on file  Social Needs  . Financial resource strain: Not hard at all  . Food insecurity:    Worry: Never true    Inability: Never true  . Transportation needs:    Medical: No    Non-medical: No  Tobacco Use  . Smoking status: Former Smoker    Last attempt to quit: 01/20/2011    Years since quitting: 8.1  . Smokeless tobacco: Never Used  Substance and Sexual Activity  . Alcohol use: No  . Drug use: No  . Sexual activity: Not Currently  Lifestyle  . Physical activity:    Days per week: 3 days    Minutes per session: 40 min  . Stress: Only a little   Relationships  . Social connections:    Talks on phone: Three times a week    Gets together: Never    Attends religious service: More than 4 times per year    Active member of club or organization: Yes    Attends meetings of clubs or organizations: More than 4 times per year    Relationship status: Married  Other Topics Concern  . Not on file  Social History Narrative  . Not on file    Outpatient Encounter Medications as of 03/28/2019  Medication Sig  . amLODipine (NORVASC) 10 MG tablet Take 1 tablet (10 mg total) by mouth daily.  Marland Kitchen aspirin EC 81 MG tablet Take 81 mg by mouth daily.  . busPIRone (BUSPAR) 5 MG tablet TAKE 1 TABLET TWICE DAILY  . Cholecalciferol (VITAMIN D3) 400 units CAPS Take 2 capsules by mouth daily.  Marland Kitchen dextromethorphan (DELSYM) 30 MG/5ML liquid Take 10 mLs (60 mg total) by mouth 2 (two) times daily.  Marland Kitchen FLUoxetine (PROZAC) 10 MG capsule TAKE 1 CAPSULE EVERY DAY (DOSE INCREASE, 30 MG TOTAL)  . FLUoxetine (PROZAC) 20 MG capsule Take 1 capsule (20 mg total) by mouth daily.  Marland Kitchen glipiZIDE (GLUCOTROL) 10 MG tablet Take 1 tablet (10 mg total) by mouth 2 (two) times daily before a meal.  . glucose blood (TRUE METRIX BLOOD GLUCOSE TEST) test strip USE  TO  TEST ONE TIME DAILY  . metFORMIN (GLUCOPHAGE) 1000 MG tablet TAKE 1 TABLET TWICE DAILY WITH A MEAL  . pravastatin (PRAVACHOL) 10 MG tablet Take 1 tablet (10 mg total) by mouth daily.  Marland Kitchen spironolactone (ALDACTONE) 50 MG tablet Take 1 tablet (50 mg total) by mouth daily.  . traMADol (ULTRAM) 50 MG tablet TAKE 1 TABLET BY MOUTH ONCE DAILY AS NEEDED FOR  UNCONTROLLED  JOINT  PAIN  . TRUEPLUS LANCETS 28G MISC USE  TO  TEST ONE TIME DAILY   No facility-administered encounter medications on file as of 03/28/2019.     Activities of Daily Living In your present state of health, do you have any difficulty performing the following activities: 03/28/2019  Hearing? N  Vision? N  Difficulty concentrating or making decisions? N   Walking or climbing stairs? Y  Comment it hurts to climb stairs because of knees  Dressing or bathing? N  Doing errands, shopping? N  Preparing Food and eating ? N  Using the Toilet? N  In the past six months, have you accidently leaked urine? N  Do you have problems with loss of bowel control? N  Managing your Medications? N  Managing your Finances? N  Housekeeping or managing your Housekeeping? N  Some recent data  might be hidden    Patient Care Team: Fayrene Helper, MD as PCP - General (Family Medicine) Satira Sark, MD as PCP - Cardiology (Cardiology)   Assessment:   This is a routine wellness examination for Norman Peters.  Exercise Activities and Dietary recommendations Current Exercise Habits: Home exercise routine, Type of exercise: walking, Time (Minutes): 30, Frequency (Times/Week): 4, Weekly Exercise (Minutes/Week): 120, Intensity: Moderate, Exercise limited by: orthopedic condition(s)  Goals   None     Fall Risk Fall Risk  03/28/2019 03/07/2019 01/16/2019 01/04/2019 08/30/2018  Falls in the past year? 0 0 0 0 No  Number falls in past yr: - - - 0 -  Injury with Fall? 0 0 0 0 -   Is the patient's home free of loose throw rugs in walkways, pet beds, electrical cords, etc?   yes      Grab bars in the bathroom? no      Handrails on the stairs?   yes      Adequate lighting?   yes  Timed Get Up and Go Performed: unable to assess telemedicine   Depression Screen PHQ 2/9 Scores 03/28/2019 03/07/2019 01/16/2019 01/04/2019  PHQ - 2 Score 0 1 1 2   PHQ- 9 Score 4 - 3 4    Cognitive Function     6CIT Screen 03/28/2019  What Year? 0 points  What month? 0 points  What time? 0 points  Count back from 20 0 points  Months in reverse 0 points  Repeat phrase 0 points  Total Score 0    Immunization History  Administered Date(s) Administered  . Influenza,inj,Quad PF,6+ Mos 07/14/2016, 08/25/2017, 08/30/2018  . Pneumococcal Polysaccharide-23 02/10/2016    Qualifies  for Shingles Vaccine?  Due at 4   Screening Tests Health Maintenance  Topic Date Due  . FOOT EXAM  05/23/2019  . HEMOGLOBIN A1C  05/30/2019  . INFLUENZA VACCINE  06/09/2019  . URINE MICROALBUMIN  09/20/2019  . OPHTHALMOLOGY EXAM  09/26/2019  . TETANUS/TDAP  01/24/2024  . COLONOSCOPY  07/07/2026  . PNEUMOCOCCAL POLYSACCHARIDE VACCINE AGE 16-64 HIGH RISK  Completed  . Hepatitis C Screening  Completed  . HIV Screening  Completed   Cancer Screenings: Lung: Low Dose CT Chest recommended if Age 57-80 years, 30 pack-year currently smoking OR have quit w/in 15years. Patient does qualify. Colorectal:  Due 2027  Additional Screenings:   Hepatitis C Screening: Completed      Plan:      1. Encounter for Medicare annual wellness exam I have personally reviewed and noted the following in the patient's chart:   . Medical and social history . Use of alcohol, tobacco or illicit drugs  . Current medications and supplements . Functional ability and status . Nutritional status . Physical activity . Advanced directives . List of other physicians . Hospitalizations, surgeries, and ER visits in previous 12 months . Vitals . Screenings to include cognitive, depression, and falls . Referrals and appointments  In addition, I have reviewed and discussed with patient certain preventive protocols, quality metrics, and best practice recommendations. A written personalized care plan for preventive services as well as general preventive health recommendations were provided to patient.     2. Erectile dysfunction, unspecified erectile dysfunction type Reports having some difficulty with erection.  Would like to try a medication to help with this.  Educated on the new medication, Cialis, and its side effects.  Provided with education and AVS that will be mailed.  Reviewed side effects, risks and  benefits of medication.   Patient acknowledged agreement and understanding of the plan.   - tadalafil  (CIALIS) 10 MG tablet; Take 1 tablet (10 mg total) by mouth daily as needed for erectile dysfunction.  Dispense: 10 tablet; Refill: 1   I provided 25 minutes of non-face-to-face time during this encounter.  Perlie Mayo, NP  03/28/2019

## 2019-03-28 NOTE — Patient Instructions (Addendum)
[name] , Thank you for taking time to come for your Medicare Wellness Visit. I appreciate your ongoing commitment to your health goals. Please review the following plan we discussed and let me know if I can assist you in the future.   I have also attached information on the new medication that we talked about today during the visit.  Please review all of this information so that you know how to properly take the medication.  Please continue to practice social distancing during this time to keep you, your family, and our community safe.  Screening recommendations/referrals: Colonoscopy: Due 2027 Recommended yearly ophthalmology/optometry visit for glaucoma screening and checkup Recommended yearly dental visit for hygiene and checkup  Vaccinations: Influenza vaccine: Due Fall 2020 Pneumococcal vaccine: Completed one Tdap vaccine: up to date Shingles vaccine: Due at 46 if insurance covers   Advanced directives: Discussed with Dr. Moshe Cipro  Conditions/risks identified: Weight, blood pressure, diabetes  Next appointment: 07/10/2019 at 840 am with Dr Moshe Cipro for 4 month follow up  Preventive Care 40-64 Years, Male Preventive care refers to lifestyle choices and visits with your health care provider that can promote health and wellness. What does preventive care include?  A yearly physical exam. This is also called an annual well check.  Dental exams once or twice a year.  Routine eye exams. Ask your health care provider how often you should have your eyes checked.  Personal lifestyle choices, including:  Daily care of your teeth and gums.  Regular physical activity.  Eating a healthy diet.  Avoiding tobacco and drug use.  Limiting alcohol use.  Practicing safe sex.  Taking low-dose aspirin every day starting at age 50. What happens during an annual well check? The services and screenings done by your health care provider during your annual well check will depend on your  age, overall health, lifestyle risk factors, and family history of disease. Counseling  Your health care provider may ask you questions about your:  Alcohol use.  Tobacco use.  Drug use.  Emotional well-being.  Home and relationship well-being.  Sexual activity.  Eating habits.  Work and work Statistician. Screening  You may have the following tests or measurements:  Height, weight, and BMI.  Blood pressure.  Lipid and cholesterol levels. These may be checked every 5 years, or more frequently if you are over 33 years old.  Skin check.  Lung cancer screening. You may have this screening every year starting at age 59 if you have a 30-pack-year history of smoking and currently smoke or have quit within the past 15 years.  Fecal occult blood test (FOBT) of the stool. You may have this test every year starting at age 53.  Flexible sigmoidoscopy or colonoscopy. You may have a sigmoidoscopy every 5 years or a colonoscopy every 10 years starting at age 49.  Prostate cancer screening. Recommendations will vary depending on your family history and other risks.  Hepatitis C blood test.  Hepatitis B blood test.  Sexually transmitted disease (STD) testing.  Diabetes screening. This is done by checking your blood sugar (glucose) after you have not eaten for a while (fasting). You may have this done every 1-3 years. Discuss your test results, treatment options, and if necessary, the need for more tests with your health care provider. Vaccines  Your health care provider may recommend certain vaccines, such as:  Influenza vaccine. This is recommended every year.  Tetanus, diphtheria, and acellular pertussis (Tdap, Td) vaccine. You may need a Td  booster every 10 years.  Zoster vaccine. You may need this after age 58.  Pneumococcal 13-valent conjugate (PCV13) vaccine. You may need this if you have certain conditions and have not been vaccinated.  Pneumococcal polysaccharide  (PPSV23) vaccine. You may need one or two doses if you smoke cigarettes or if you have certain conditions. Talk to your health care provider about which screenings and vaccines you need and how often you need them. This information is not intended to replace advice given to you by your health care provider. Make sure you discuss any questions you have with your health care provider. Document Released: 11/21/2015 Document Revised: 07/14/2016 Document Reviewed: 08/26/2015 Elsevier Interactive Patient Education  2017 [place] Prevention in the Home Falls can cause injuries. They can happen to people of all ages. There are many things you can do to make your home safe and to help prevent falls. What can I do on the outside of my home?  Regularly fix the edges of walkways and driveways and fix any cracks.  Remove anything that might make you trip as you walk through a door, such as a raised step or threshold.  Trim any bushes or trees on the path to your home.  Use bright outdoor lighting.  Clear any walking paths of anything that might make someone trip, such as rocks or tools.  Regularly check to see if handrails are loose or broken. Make sure that both sides of any steps have handrails.  Any raised decks and porches should have guardrails on the edges.  Have any leaves, snow, or ice cleared regularly.  Use sand or salt on walking paths during winter.  Clean up any spills in your garage right away. This includes oil or grease spills. What can I do in the bathroom?  Use night lights.  Install grab bars by the toilet and in the tub and shower. Do not use towel bars as grab bars.  Use non-skid mats or decals in the tub or shower.  If you need to sit down in the shower, use a plastic, non-slip stool.  Keep the floor dry. Clean up any water that spills on the floor as soon as it happens.  Remove soap buildup in the tub or shower regularly.  Attach bath mats securely  with double-sided non-slip rug tape.  Do not have throw rugs and other things on the floor that can make you trip. What can I do in the bedroom?  Use night lights.  Make sure that you have a light by your bed that is easy to reach.  Do not use any sheets or blankets that are too big for your bed. They should not hang down onto the floor.  Have a firm chair that has side arms. You can use this for support while you get dressed.  Do not have throw rugs and other things on the floor that can make you trip. What can I do in the kitchen?  Clean up any spills right away.  Avoid walking on wet floors.  Keep items that you use a lot in easy-to-reach places.  If you need to reach something above you, use a strong step stool that has a grab bar.  Keep electrical cords out of the way.  Do not use floor polish or wax that makes floors slippery. If you must use wax, use non-skid floor wax.  Do not have throw rugs and other things on the floor that can make you trip.  What can I do with my stairs?  Do not leave any items on the stairs.  Make sure that there are handrails on both sides of the stairs and use them. Fix handrails that are broken or loose. Make sure that handrails are as long as the stairways.  Check any carpeting to make sure that it is firmly attached to the stairs. Fix any carpet that is loose or worn.  Avoid having throw rugs at the top or bottom of the stairs. If you do have throw rugs, attach them to the floor with carpet tape.  Make sure that you have a light switch at the top of the stairs and the bottom of the stairs. If you do not have them, ask someone to add them for you. What else can I do to help prevent falls?  Wear shoes that:  Do not have high heels.  Have rubber bottoms.  Are comfortable and fit you well.  Are closed at the toe. Do not wear sandals.  If you use a stepladder:  Make sure that it is fully opened. Do not climb a closed  stepladder.  Make sure that both sides of the stepladder are locked into place.  Ask someone to hold it for you, if possible.  Clearly mark and make sure that you can see:  Any grab bars or handrails.  First and last steps.  Where the edge of each step is.  Use tools that help you move around (mobility aids) if they are needed. These include:  Canes.  Walkers.  Scooters.  Crutches.  Turn on the lights when you go into a dark area. Replace any light bulbs as soon as they burn out.  Set up your furniture so you have a clear path. Avoid moving your furniture around.  If any of your floors are uneven, fix them.  If there are any pets around you, be aware of where they are.  Review your medicines with your doctor. Some medicines can make you feel dizzy. This can increase your chance of falling. Ask your doctor what other things that you can do to help prevent falls. This information is not intended to replace advice given to you by your health care provider. Make sure you discuss any questions you have with your health care provider. Document Released: 08/21/2009 Document Revised: 04/01/2016 Document Reviewed: 11/29/2014 Elsevier Interactive Patient Education  2017 [place].  Tadalafil tablets (Cialis) What is this medicine? TADALAFIL (tah DA la fil) is used to treat erection problems in men. It is also used for enlargement of the prostate gland in men, a condition called benign prostatic hyperplasia or BPH. This medicine improves urine flow and reduces BPH symptoms. This medicine can also treat both erection problems and BPH when they occur together. This medicine may be used for other purposes; ask your health care provider or pharmacist if you have questions. COMMON BRAND NAME(S): Kathaleen Bury, Cialis What should I tell my health care provider before I take this medicine? They need to know if you have any of these conditions: -bleeding disorders -eye or vision  problems, including a rare inherited eye disease called retinitis pigmentosa -anatomical deformation of the penis, Peyronie's disease, or history of priapism (painful and prolonged erection) -heart disease, angina, a history of heart attack, irregular heart beats, or other heart problems -high or low blood pressure -history of blood diseases, like sickle cell anemia or leukemia -history of stomach bleeding -kidney disease -liver disease -stroke -an unusual or allergic  reaction to tadalafil, other medicines, foods, dyes, or preservatives -pregnant or trying to get pregnant -breast-feeding  How should I use this medicine? Take this medicine by mouth with a glass of water. Follow the directions on the prescription label. You may take this medicine with or without meals. When this medicine is used for erection problems, your doctor may prescribe it to be taken once daily or as needed. If you are taking the medicine as needed, you may be able to have sexual activity 30 minutes after taking it and for up to 36 hours after taking it. Whether you are taking the medicine as needed or once daily, you should not take more than one dose per day. If you are taking this medicine for symptoms of benign prostatic hyperplasia (BPH) or to treat both BPH and an erection problem, take the dose once daily at about the same time each day. Do not take your medicine more often than directed. Talk to your pediatrician regarding the use of this medicine in children. Special care may be needed. Overdosage: If you think you have taken too much of this medicine contact a poison control center or emergency room at once. NOTE: This medicine is only for you. Do not share this medicine with others.  What if I miss a dose? If you are taking this medicine as needed for erection problems, this does not apply. If you miss a dose while taking this medicine once daily for an erection problem, benign prostatic hyperplasia, or both,  take it as soon as you remember, but do not take more than one dose per day.  What may interact with this medicine? Do not take this medicine with any of the following medications: -nitrates like amyl nitrite, isosorbide dinitrate, isosorbide mononitrate, nitroglycerin -other medicines for erectile dysfunction like avanafil, sildenafil, vardenafil -other tadalafil products (Adcirca) -riociguat This medicine may also interact with the following medications: -certain drugs for high blood pressure -certain drugs for the treatment of HIV infection or AIDS -certain drugs used for fungal or yeast infections, like fluconazole, itraconazole, ketoconazole, and voriconazole -certain drugs used for seizures like carbamazepine, phenytoin, and phenobarbital -grapefruit juice -macrolide antibiotics like clarithromycin, erythromycin, troleandomycin -medicines for prostate problems -rifabutin, rifampin or rifapentine This list may not describe all possible interactions. Give your health care provider a list of all the medicines, herbs, non-prescription drugs, or dietary supplements you use. Also tell them if you smoke, drink alcohol, or use illegal drugs. Some items may interact with your medicine.  What should I watch for while using this medicine? If you notice any changes in your vision while taking this drug, call your doctor or health care professional as soon as possible. Stop using this medicine and call your health care provider right away if you have a loss of sight in one or both eyes. Contact your doctor or health care professional right away if the erection lasts longer than 4 hours or if it becomes painful. This may be a sign of serious problem and must be treated right away to prevent permanent damage. If you experience symptoms of nausea, dizziness, chest pain or arm pain upon initiation of sexual activity after taking this medicine, you should refrain from further activity and call your doctor  or health care professional as soon as possible. Do not drink alcohol to excess (examples, 5 glasses of wine or 5 shots of whiskey) when taking this medicine. When taken in excess, alcohol can increase your chances of getting a headache or  getting dizzy, increasing your heart rate or lowering your blood pressure. Using this medicine does not protect you or your partner against HIV infection (the virus that causes AIDS) or other sexually transmitted diseases. What side effects may I notice from receiving this medicine? Side effects that you should report to your doctor or health care professional as soon as possible: -allergic reactions like skin rash, itching or hives, swelling of the face, lips, or tongue -breathing problems -changes in hearing -changes in vision -chest pain -fast, irregular heartbeat -prolonged or painful erection -seizures Side effects that usually do not require medical attention (report to your doctor or health care professional if they continue or are bothersome): -back pain -dizziness -flushing -headache -indigestion -muscle aches -nausea -stuffy or runny nose This list may not describe all possible side effects. Call your doctor for medical advice about side effects. You may report side effects to FDA at 1-800-FDA-1088. Where should I keep my medicine? Keep out of the reach of children. Store at room temperature between 15 and 30 degrees C (59 and 86 degrees F). Throw away any unused medicine after the expiration date. NOTE: This sheet is a summary. It may not cover all possible information. If you have questions about this medicine, talk to your doctor, pharmacist, or health care provider.  2019 Elsevier/Gold Standard (2014-03-15 13:15:49)

## 2019-04-12 ENCOUNTER — Other Ambulatory Visit: Payer: Self-pay | Admitting: Family Medicine

## 2019-05-03 ENCOUNTER — Encounter: Payer: Self-pay | Admitting: Family Medicine

## 2019-05-03 ENCOUNTER — Ambulatory Visit (INDEPENDENT_AMBULATORY_CARE_PROVIDER_SITE_OTHER): Payer: Medicare HMO | Admitting: Family Medicine

## 2019-05-03 VITALS — BP 140/84 | Ht 68.0 in | Wt 207.0 lb

## 2019-05-03 DIAGNOSIS — F329 Major depressive disorder, single episode, unspecified: Secondary | ICD-10-CM | POA: Diagnosis not present

## 2019-05-03 DIAGNOSIS — E663 Overweight: Secondary | ICD-10-CM

## 2019-05-03 DIAGNOSIS — E785 Hyperlipidemia, unspecified: Secondary | ICD-10-CM

## 2019-05-03 DIAGNOSIS — E1165 Type 2 diabetes mellitus with hyperglycemia: Secondary | ICD-10-CM | POA: Diagnosis not present

## 2019-05-03 DIAGNOSIS — I10 Essential (primary) hypertension: Secondary | ICD-10-CM

## 2019-05-03 DIAGNOSIS — F419 Anxiety disorder, unspecified: Secondary | ICD-10-CM

## 2019-05-03 NOTE — Patient Instructions (Signed)
F/U in office with MD next week, needs to start insulin  PLEASE GET Hba1c , CHEM 7 AND egfr TODAY AS WE DISCUSSED  I WILL BE PRESCRIBING INSULIN FOR YOU, AND IT IS IMPORTANT YOU BRING IT TO YOUR VISIT NEXT WEEK  YOUR BLOOD SUGAR REMAINS TOO HIGH FOR YOU TO BE HEALTHY, LOWERING IT TO A NORMAL RANGE IS NECESSARY

## 2019-05-03 NOTE — Assessment & Plan Note (Addendum)
Deterioration with persistent hyperglycemia  Needs insulin and diabetic education.  Best to be managed  By Endo, pt insists wants to work wit PCP, starting lantus 10 units daily, continue current meds, to return with blood sugars 3 to 4 times daily testing for review for possible med adjustment and OV in 4 weeks, he is to call with concerns  HBA1C, chem7 and eGFR today Norman Peters is reminded of the importance of commitment to daily physical activity for 30 minutes or more, as able and the need to limit carbohydrate intake to 30 to 60 grams per meal to help with blood sugar control.   The need to take medication as prescribed, test blood sugar as directed, and to call between visits if there is a concern that blood sugar is uncontrolled is also discussed.   Norman Peters is reminded of the importance of daily foot exam, annual eye examination, and good blood sugar, blood pressure and cholesterol control.  Diabetic Labs Latest Ref Rng & Units 05/03/2019 01/04/2019 11/29/2018 09/19/2018 08/31/2018  HbA1c <5.7 % of total Hgb 11.3(H) - 9.1(H) 8.9(H) -  Microalbumin mg/dL - - - 83.1 844.2(H)  Micro/Creat Ratio 0.0 - 30.0 mg/g creat - - - - 304.9(H)  Chol <200 mg/dL - 112 - 108 -  HDL > OR = 40 mg/dL - 33(L) - 36(L) -  Calc LDL mg/dL (calc) - 46 - 47 -  Triglycerides <150 mg/dL - 313(H) - 172(H) -  Creatinine 0.70 - 1.25 mg/dL 1.26(H) 1.30(H) 1.56(H) 1.21 -   BP/Weight 05/07/2019 05/03/2019 03/28/2019 01/16/2019 01/04/2019 10/18/2018 74/71/8550  Systolic BP 158 682 574 935 521 747 159  Diastolic BP 82 84 84 84 84 82 80  Wt. (Lbs) 212 207 207 207.12 207 208.6 207  BMI 32.23 31.47 31.47 31.49 31.47 31.72 31.47   Foot/eye exam completion dates Latest Ref Rng & Units 05/22/2018 05/25/2017  Eye Exam No Retinopathy - -  Foot Form Completion - Done Done

## 2019-05-04 ENCOUNTER — Other Ambulatory Visit: Payer: Self-pay | Admitting: Family Medicine

## 2019-05-04 LAB — BASIC METABOLIC PANEL WITH GFR
BUN/Creatinine Ratio: 12 (calc) (ref 6–22)
BUN: 15 mg/dL (ref 7–25)
CO2: 27 mmol/L (ref 20–32)
Calcium: 9.6 mg/dL (ref 8.6–10.3)
Chloride: 98 mmol/L (ref 98–110)
Creat: 1.26 mg/dL — ABNORMAL HIGH (ref 0.70–1.25)
GFR, Est African American: 70 mL/min/{1.73_m2} (ref >=60)
GFR, Est Non African American: 60 mL/min/{1.73_m2} (ref >=60)
Glucose, Bld: 231 mg/dL — ABNORMAL HIGH (ref 65–139)
Potassium: 4.7 mmol/L (ref 3.5–5.3)
Sodium: 135 mmol/L (ref 135–146)

## 2019-05-04 LAB — HEMOGLOBIN A1C
Hgb A1c MFr Bld: 11.3 % of total Hgb — ABNORMAL HIGH (ref <5.7)
Mean Plasma Glucose: 278 (calc)
eAG (mmol/L): 15.4 (calc)

## 2019-05-04 LAB — SPECIMEN COMPROMISED

## 2019-05-07 ENCOUNTER — Other Ambulatory Visit: Payer: Self-pay

## 2019-05-07 ENCOUNTER — Ambulatory Visit (INDEPENDENT_AMBULATORY_CARE_PROVIDER_SITE_OTHER): Payer: Medicare HMO | Admitting: Family Medicine

## 2019-05-07 ENCOUNTER — Encounter: Payer: Self-pay | Admitting: Family Medicine

## 2019-05-07 VITALS — BP 130/82 | HR 91 | Temp 97.7°F | Resp 15 | Ht 68.0 in | Wt 212.0 lb

## 2019-05-07 DIAGNOSIS — Z6832 Body mass index (BMI) 32.0-32.9, adult: Secondary | ICD-10-CM

## 2019-05-07 DIAGNOSIS — E1165 Type 2 diabetes mellitus with hyperglycemia: Secondary | ICD-10-CM

## 2019-05-07 DIAGNOSIS — I10 Essential (primary) hypertension: Secondary | ICD-10-CM | POA: Diagnosis not present

## 2019-05-07 DIAGNOSIS — E785 Hyperlipidemia, unspecified: Secondary | ICD-10-CM | POA: Diagnosis not present

## 2019-05-07 DIAGNOSIS — Z794 Long term (current) use of insulin: Secondary | ICD-10-CM | POA: Diagnosis not present

## 2019-05-07 DIAGNOSIS — E6609 Other obesity due to excess calories: Secondary | ICD-10-CM

## 2019-05-07 MED ORDER — INSULIN GLARGINE 100 UNIT/ML SOLOSTAR PEN: 20.0000 [IU] | PEN_INJECTOR | Freq: Every day | SUBCUTANEOUS | 11 refills | Status: DC

## 2019-05-07 NOTE — Assessment & Plan Note (Signed)
Hyperlipidemia:Low fat diet discussed and encouraged.   Lipid Panel  Lab Results  Component Value Date   CHOL 112 01/04/2019   HDL 33 (L) 01/04/2019   LDLCALC 46 01/04/2019   TRIG 313 (H) 01/04/2019   CHOLHDL 3.4 01/04/2019   Needs to reduce fatty foods.ept lab for 3 month follow up

## 2019-05-07 NOTE — Assessment & Plan Note (Signed)
Deteriorated and  Uncontrolled , pt to start insulin , test at least 3 times daily and to call in 1 week with numbers Office f/u in 4 weeks Norman Peters is reminded of the importance of commitment to daily physical activity for 30 minutes or more, as able and the need to limit carbohydrate intake to 30 to 60 grams per meal to help with blood sugar control.   The need to take medication as prescribed, test blood sugar as directed, and to call between visits if there is a concern that blood sugar is uncontrolled is also discussed.   Norman Peters is reminded of the importance of daily foot exam, annual eye examination, and good blood sugar, blood pressure and cholesterol control.  Diabetic Labs Latest Ref Rng & Units 05/03/2019 01/04/2019 11/29/2018 09/19/2018 08/31/2018  HbA1c <5.7 % of total Hgb 11.3(H) - 9.1(H) 8.9(H) -  Microalbumin mg/dL - - - 83.1 844.2(H)  Micro/Creat Ratio 0.0 - 30.0 mg/g creat - - - - 304.9(H)  Chol <200 mg/dL - 112 - 108 -  HDL > OR = 40 mg/dL - 33(L) - 36(L) -  Calc LDL mg/dL (calc) - 46 - 47 -  Triglycerides <150 mg/dL - 313(H) - 172(H) -  Creatinine 0.70 - 1.25 mg/dL 1.26(H) 1.30(H) 1.56(H) 1.21 -   BP/Weight 05/07/2019 05/03/2019 03/28/2019 01/16/2019 01/04/2019 10/18/2018 61/22/4497  Systolic BP 530 051 102 111 735 670 141  Diastolic BP 82 84 84 84 84 82 80  Wt. (Lbs) 212 207 207 207.12 207 208.6 207  BMI 32.23 31.47 31.47 31.49 31.47 31.72 31.47   Foot/eye exam completion dates Latest Ref Rng & Units 05/22/2018 05/25/2017  Eye Exam No Retinopathy - -  Foot Form Completion - Done Done

## 2019-05-07 NOTE — Progress Notes (Signed)
Norman Peters     MRN: [id number]      DOB: [birth year]   HPI [name] is here for follow up and re-evaluation of chronic medical conditions, medication management and review of any available recent lab and radiology data.  Specifically here for management of uncontrolled and deteriorating blood sugar and to start insulin YThough asked, he has not brought log book, states he is testing and most often blood sugar is around 200, and the in house blood sugar test does correlate with his morning blood sugar Denies hypoglycemia, his bedtime snack continues to be something sweet Has not been to diabetic educator for some time but will resume. No regular exercise , c/o knee pain and takes tramadol with some success ROS Denies recent fever or chills. Denies sinus pressure, nasal congestion, ear pain or sore throat. Denies chest congestion, productive cough or wheezing. Denies chest pains, palpitations and leg swelling Denies abdominal pain, nausea, vomiting,diarrhea or constipation.   Denies dysuria, frequency, hesitancy or incontinence. Denies headaches, seizures, numbness, or tingling. Denies depression, anxiety or insomnia. Denies skin break down or rash.   PE  BP 130/82   Pulse 91   Temp 97.7 F (36.5 C) (Temporal)   Resp 15   Ht 5\' 8"  (1.727 m)   Wt 212 lb (96.2 kg)   SpO2 96%   BMI 32.23 kg/m   Patient alert and oriented and in no cardiopulmonary distress.  HEENT: No facial asymmetry, EOMI,   oropharynx pink and moist.  Neck supple no JVD, no mass.  Chest: Clear to auscultation bilaterally.  CVS: S1, S2 no murmurs, no S3.Regular rate.  ABD: Soft non tender.   Ext: No edema  MS: Adequate though reduced  ROM spine, shoulders, hips and knees.  Skin: Intact, no ulcerations or rash noted.  Psych: Good eye contact, normal affect. Memory intact not anxious or depressed appearing.  CNS: CN 2-12 intact, power,  normal throughout.no focal deficits noted.   Assessment &  Plan  Type 2 diabetes mellitus with hyperglycemia (HCC) Deteriorated and  Uncontrolled , pt to start insulin , test at least 3 times daily and to call in 1 week with numbers Office f/u in 4 weeks Mr. Haffey is reminded of the importance of commitment to daily physical activity for 30 minutes or more, as able and the need to limit carbohydrate intake to 30 to 60 grams per meal to help with blood sugar control.   The need to take medication as prescribed, test blood sugar as directed, and to call between visits if there is a concern that blood sugar is uncontrolled is also discussed.   Mr. Tulloch is reminded of the importance of daily foot exam, annual eye examination, and good blood sugar, blood pressure and cholesterol control.  Diabetic Labs Latest Ref Rng & Units 05/03/2019 01/04/2019 11/29/2018 09/19/2018 08/31/2018  HbA1c <5.7 % of total Hgb 11.3(H) - 9.1(H) 8.9(H) -  Microalbumin mg/dL - - - 83.1 844.2(H)  Micro/Creat Ratio 0.0 - 30.0 mg/g creat - - - - 304.9(H)  Chol <200 mg/dL - 112 - 108 -  HDL > OR = 40 mg/dL - 33(L) - 36(L) -  Calc LDL mg/dL (calc) - 46 - 47 -  Triglycerides <150 mg/dL - 313(H) - 172(H) -  Creatinine 0.70 - 1.25 mg/dL 1.26(H) 1.30(H) 1.56(H) 1.21 -   BP/Weight 05/07/2019 05/03/2019 03/28/2019 01/16/2019 01/04/2019 10/18/2018 67/20/9470  Systolic BP 962 836 629 476 546 503 546  Diastolic BP 82 84 84 84  84 82 80  Wt. (Lbs) 212 207 207 207.12 207 208.6 207  BMI 32.23 31.47 31.47 31.49 31.47 31.72 31.47   Foot/eye exam completion dates Latest Ref Rng & Units 05/22/2018 05/25/2017  Eye Exam No Retinopathy - -  Foot Form Completion - Done Done        Hyperlipidemia LDL goal <100 Hyperlipidemia:Low fat diet discussed and encouraged.   Lipid Panel  Lab Results  Component Value Date   CHOL 112 01/04/2019   HDL 33 (L) 01/04/2019   LDLCALC 46 01/04/2019   TRIG 313 (H) 01/04/2019   CHOLHDL 3.4 01/04/2019   Needs to reduce fatty foods.ept lab for 3 month follow up     Obesity  Patient re-educated about  the importance of commitment to a  minimum of 150 minutes of exercise per week as able.  The importance of healthy food choices with portion control discussed, as well as eating regularly and within a 12 hour window most days. The need to choose "clean , [place]" food 50 to 75% of the time is discussed, as well as to make water the primary drink and set a goal of 64 ounces water daily.    Weight /BMI 05/07/2019 05/03/2019 03/28/2019  WEIGHT 212 lb 207 lb 207 lb  HEIGHT 5\' 8"  5\' 8"  5\' 8"   BMI 32.23 kg/m2 31.47 kg/m2 31.47 kg/m2      Hypertension goal BP (blood pressure) < 130/80 Controlled, no change in medication DASH diet and commitment to daily physical activity for a minimum of 30 minutes discussed and encouraged, as a part of hypertension management. The importance of attaining a healthy weight is also discussed.  BP/Weight 05/07/2019 05/03/2019 03/28/2019 01/16/2019 01/04/2019 10/18/2018 09/40/7680  Systolic BP 881 103 159 458 592 924 462  Diastolic BP 82 84 84 84 84 82 80  Wt. (Lbs) 212 207 207 207.12 207 208.6 207  BMI 32.23 31.47 31.47 31.49 31.47 31.72 31.47

## 2019-05-07 NOTE — Patient Instructions (Addendum)
F/U in 4 weeks, with log book  Return to diabetic educator for support and help  Continue  metformin and glipizide as you take them  Start 10 units lantus once daily at 11 pm every night  Test and write down blood sugar before breakfast, 2 hours after lunch , and at 11 pm  Goal for fasting blood sugar ranges from 90 to 130 and 2 hours after any meal or at bedtime should be between 130 to 180.   Call next week for me to review your blood sugar values

## 2019-05-07 NOTE — Progress Notes (Signed)
Norman Peters     MRN: [id number]      DOB: Jan 13, 1956   HPI Norman Peters is here for follow up of uncontrolled diabetes and starting insulin. See prior note which is saved ROS Denies recent fever or chills. Denies sinus pressure, nasal congestion, ear pain or sore throat. Denies chest congestion, productive cough or wheezing. Denies chest pains, palpitations and leg swelling Denies abdominal pain, nausea, vomiting,diarrhea or constipation.   Denies dysuria, frequency, hesitancy or incontinence.  Denies headaches, seizures, numbness, or tingling. Denies depression, anxiety or insomnia. Denies skin break down or rash.   PE  BP 130/82   Pulse 91   Temp 97.7 F (36.5 C) (Temporal)   Resp 15   Ht 5\' 8"  (1.727 m)   Wt 212 lb (96.2 kg)   SpO2 96%   BMI 32.23 kg/m   Patient alert and oriented and in no cardiopulmonary distress.  HEENT: No facial asymmetry, EOMI,   oropharynx pink and moist.  Neck supple no JVD, no mass.  Chest: Clear to auscultation bilaterally.  CVS: S1, S2 no murmurs, no S3.Regular rate.  ABD: Soft non tender.   Ext: No edema  MS: Adequate though reduced  ROM spine, shoulders, hips and knees.  Skin: Intact, no ulcerations or rash noted.  Psych: Good eye contact, normal affect. Memory intact not anxious or depressed appearing.  CNS: CN 2-12 intact, power,  normal throughout.no focal deficits noted.   Assessment & Plan Type 2 diabetes mellitus with hyperglycemia (HCC) Deteriorated and  Uncontrolled , pt to start insulin , test at least 3 times daily and to call in 1 week with numbers Office f/u in 4 weeks Norman Peters is reminded of the importance of commitment to daily physical activity for 30 minutes or more, as able and the need to limit carbohydrate intake to 30 to 60 grams per meal to help with blood sugar control.   The need to take medication as prescribed, test blood sugar as directed, and to call between visits if there is a concern that blood  sugar is uncontrolled is also discussed.   Norman Peters is reminded of the importance of daily foot exam, annual eye examination, and good blood sugar, blood pressure and cholesterol control.  Diabetic Labs Latest Ref Rng & Units 05/03/2019 01/04/2019 11/29/2018 09/19/2018 08/31/2018  HbA1c <5.7 % of total Hgb 11.3(H) - 9.1(H) 8.9(H) -  Microalbumin mg/dL - - - 83.1 844.2(H)  Micro/Creat Ratio 0.0 - 30.0 mg/g creat - - - - 304.9(H)  Chol <200 mg/dL - 112 - 108 -  HDL > OR = 40 mg/dL - 33(L) - 36(L) -  Calc LDL mg/dL (calc) - 46 - 47 -  Triglycerides <150 mg/dL - 313(H) - 172(H) -  Creatinine 0.70 - 1.25 mg/dL 1.26(H) 1.30(H) 1.56(H) 1.21 -   BP/Weight 05/07/2019 05/03/2019 03/28/2019 01/16/2019 01/04/2019 10/18/2018 18/56/3149  Systolic BP 702 637 858 850 277 412 878  Diastolic BP 82 84 84 84 84 82 80  Wt. (Lbs) 212 207 207 207.12 207 208.6 207  BMI 32.23 31.47 31.47 31.49 31.47 31.72 31.47   Foot/eye exam completion dates Latest Ref Rng & Units 05/22/2018 05/25/2017  Eye Exam No Retinopathy - -  Foot Form Completion - Done Done        Hyperlipidemia LDL goal <100 Hyperlipidemia:Low fat diet discussed and encouraged.   Lipid Panel  Lab Results  Component Value Date   CHOL 112 01/04/2019   HDL 33 (L) 01/04/2019  LDLCALC 46 01/04/2019   TRIG 313 (H) 01/04/2019   CHOLHDL 3.4 01/04/2019   Needs to reduce fatty foods.ept lab for 3 month follow up    Obesity  Patient re-educated about  the importance of commitment to a  minimum of 150 minutes of exercise per week as able.  The importance of healthy food choices with portion control discussed, as well as eating regularly and within a 12 hour window most days. The need to choose "clean , [place]" food 50 to 75% of the time is discussed, as well as to make water the primary drink and set a goal of 64 ounces water daily.    Weight /BMI 05/07/2019 05/03/2019 03/28/2019  WEIGHT 212 lb 207 lb 207 lb  HEIGHT 5\' 8"  5\' 8"  5\' 8"   BMI 32.23  kg/m2 31.47 kg/m2 31.47 kg/m2      Hypertension goal BP (blood pressure) < 130/80 Controlled, no change in medication DASH diet and commitment to daily physical activity for a minimum of 30 minutes discussed and encouraged, as a part of hypertension management. The importance of attaining a healthy weight is also discussed.  BP/Weight 05/07/2019 05/03/2019 03/28/2019 01/16/2019 01/04/2019 10/18/2018 54/00/8676  Systolic BP 195 093 267 124 580 998 338  Diastolic BP 82 84 84 84 84 82 80  Wt. (Lbs) 212 207 207 207.12 207 208.6 207  BMI 32.23 31.47 31.47 31.49 31.47 31.72 31.47

## 2019-05-07 NOTE — Assessment & Plan Note (Signed)
Controlled, no change in medication DASH diet and commitment to daily physical activity for a minimum of 30 minutes discussed and encouraged, as a part of hypertension management. The importance of attaining a healthy weight is also discussed.  BP/Weight 05/07/2019 05/03/2019 03/28/2019 01/16/2019 01/04/2019 10/18/2018 00/45/9977  Systolic BP 414 239 532 023 343 568 616  Diastolic BP 82 84 84 84 84 82 80  Wt. (Lbs) 212 207 207 207.12 207 208.6 207  BMI 32.23 31.47 31.47 31.49 31.47 31.72 31.47

## 2019-05-07 NOTE — Assessment & Plan Note (Signed)
  Patient re-educated about  the importance of commitment to a  minimum of 150 minutes of exercise per week as able.  The importance of healthy food choices with portion control discussed, as well as eating regularly and within a 12 hour window most days. The need to choose "clean , [place]" food 50 to 75% of the time is discussed, as well as to make water the primary drink and set a goal of 64 ounces water daily.    Weight /BMI 05/07/2019 05/03/2019 03/28/2019  WEIGHT 212 lb 207 lb 207 lb  HEIGHT 5\' 8"  5\' 8"  5\' 8"   BMI 32.23 kg/m2 31.47 kg/m2 31.47 kg/m2

## 2019-05-12 ENCOUNTER — Other Ambulatory Visit: Payer: Self-pay | Admitting: Family Medicine

## 2019-05-23 ENCOUNTER — Encounter: Payer: Self-pay | Admitting: Family Medicine

## 2019-05-23 DIAGNOSIS — E663 Overweight: Secondary | ICD-10-CM | POA: Insufficient documentation

## 2019-05-23 NOTE — Assessment & Plan Note (Signed)
Hyperlipidemia:Low fat diet discussed and encouraged.   Lipid Panel  Lab Results  Component Value Date   CHOL 112 01/04/2019   HDL 33 (L) 01/04/2019   LDLCALC 46 01/04/2019   TRIG 313 (H) 01/04/2019   CHOLHDL 3.4 01/04/2019   Updated lab needed at/ before next visit. Needs to reduce fatty food and increase exercise

## 2019-05-23 NOTE — Assessment & Plan Note (Signed)
  Patient re-educated about  the importance of commitment to a  minimum of 150 minutes of exercise per week as able.  The importance of healthy food choices with portion control discussed, as well as eating regularly and within a 12 hour window most days. The need to choose "clean , [place]" food 50 to 75% of the time is discussed, as well as to make water the primary drink and set a goal of 64 ounces water daily.    Weight /BMI 05/07/2019 05/03/2019 03/28/2019  WEIGHT 212 lb 207 lb 207 lb  HEIGHT 5\' 8"  5\' 8"  5\' 8"   BMI 32.23 kg/m2 31.47 kg/m2 31.47 kg/m2

## 2019-05-23 NOTE — Assessment & Plan Note (Signed)
Controlled, no change in medication  

## 2019-05-23 NOTE — Progress Notes (Signed)
Norman Peters     MRN: [id number]      DOB: [birth year]   HPI Norman Peters is here for follow up and re-evaluation of chronic medical conditions, in particular uncontrolled diabetes, medication management and review of any available recent lab and radiology data.  Preventive health is updated, specifically  Cancer screening and Immunization.   Questions or concerns regarding consultations or procedures which the PT has had in the interim are  addressed. Strongly encouraged to see Endocrinology since no progress, opts to stay wit PCP and start insulin and will return in 1 week with blood sugar values C/o polyuria, ROS Denies recent fever or chills. Denies sinus pressure, nasal congestion, ear pain or sore throat. Denies chest congestion, productive cough or wheezing. Denies chest pains, palpitations and leg swelling Denies abdominal pain, nausea, vomiting,diarrhea or constipation.   Denies dysuria, frequency, hesitancy or incontinence. C/o chronic  joint pain, swelling and limitation in mobility. Denies headaches, seizures, numbness, or tingling. Denies uncontrolled  depression, anxiety or insomnia. Denies skin break down or rash.   PE  BP 140/84   Ht '5\' 8"'  (1.727 m)   Wt 207 lb (93.9 kg)   BMI 31.47 kg/m   Patient alert and oriented and in no cardiopulmonary distress.  HEENT: No facial asymmetry, EOMI,   oropharynx pink and moist.  Neck supple no JVD, no mass.  Chest: Clear to auscultation bilaterally.  CVS: S1, S2 no murmurs, no S3.Regular rate.  ABD: Soft non tender.   Ext: No edema  MS: Adequate ROM spine, shoulders, hips and knees.  Skin: Intact, no ulcerations or rash noted.  Psych: Good eye contact, normal affect. Memory intact not anxious or depressed appearing.  CNS: CN 2-12 intact, power,  normal throughout.no focal deficits noted.   Assessment & Plan  Type 2 diabetes mellitus with hyperglycemia (HCC) Deterioration with persistent hyperglycemia  Needs  insulin and diabetic education.  Best to be managed  By Endo, pt insists wants to work wit PCP, starting lantus 10 units daily, continue current meds, to return with blood sugars 3 to 4 times daily testing for review for possible med adjustment and OV in 4 weeks, he is to call with concerns  HBA1C, chem7 and eGFR today Norman Peters is reminded of the importance of commitment to daily physical activity for 30 minutes or more, as able and the need to limit carbohydrate intake to 30 to 60 grams per meal to help with blood sugar control.   The need to take medication as prescribed, test blood sugar as directed, and to call between visits if there is a concern that blood sugar is uncontrolled is also discussed.   Norman Peters is reminded of the importance of daily foot exam, annual eye examination, and good blood sugar, blood pressure and cholesterol control.  Diabetic Labs Latest Ref Rng & Units 05/03/2019 01/04/2019 11/29/2018 09/19/2018 08/31/2018  HbA1c <5.7 % of total Hgb 11.3(H) - 9.1(H) 8.9(H) -  Microalbumin mg/dL - - - 83.1 844.2(H)  Micro/Creat Ratio 0.0 - 30.0 mg/g creat - - - - 304.9(H)  Chol <200 mg/dL - 112 - 108 -  HDL > OR = 40 mg/dL - 33(L) - 36(L) -  Calc LDL mg/dL (calc) - 46 - 47 -  Triglycerides <150 mg/dL - 313(H) - 172(H) -  Creatinine 0.70 - 1.25 mg/dL 1.26(H) 1.30(H) 1.56(H) 1.21 -   BP/Weight 05/07/2019 05/03/2019 03/28/2019 01/16/2019 01/04/2019 10/18/2018 46/19/0122  Systolic BP 241 146 431 427 120 124 130  Diastolic BP 82 84 84 84 84 82 80  Wt. (Lbs) 212 207 207 207.12 207 208.6 207  BMI 32.23 31.47 31.47 31.49 31.47 31.72 31.47   Foot/eye exam completion dates Latest Ref Rng & Units 05/22/2018 05/25/2017  Eye Exam No Retinopathy - -  Foot Form Completion - Done Done        Hypertension goal BP (blood pressure) < 130/80 Uncontrolled , not at goal, generally within range re eval in 4 weeks before med adjustment  DASH diet and commitment to daily physical activity for a  minimum of 30 minutes discussed and encouraged, as a part of hypertension management. The importance of attaining a healthy weight is also discussed.  BP/Weight 05/07/2019 05/03/2019 03/28/2019 01/16/2019 01/04/2019 10/18/2018 68/86/4847  Systolic BP 207 218 288 337 445 146 047  Diastolic BP 82 84 84 84 84 82 80  Wt. (Lbs) 212 207 207 207.12 207 208.6 207  BMI 32.23 31.47 31.47 31.49 31.47 31.72 31.47       Hyperlipidemia LDL goal <100 Hyperlipidemia:Low fat diet discussed and encouraged.   Lipid Panel  Lab Results  Component Value Date   CHOL 112 01/04/2019   HDL 33 (L) 01/04/2019   LDLCALC 46 01/04/2019   TRIG 313 (H) 01/04/2019   CHOLHDL 3.4 01/04/2019   Updated lab needed at/ before next visit. Needs to reduce fatty food and increase exercise    Anxiety and depression Controlled, no change in medication   Overweight (BMI 25.0-29.9)  Patient re-educated about  the importance of commitment to a  minimum of 150 minutes of exercise per week as able.  The importance of healthy food choices with portion control discussed, as well as eating regularly and within a 12 hour window most days. The need to choose "clean , [place]" food 50 to 75% of the time is discussed, as well as to make water the primary drink and set a goal of 64 ounces water daily.    Weight /BMI 05/07/2019 05/03/2019 03/28/2019  WEIGHT 212 lb 207 lb 207 lb  HEIGHT '5\' 8"'  '5\' 8"'  '5\' 8"'   BMI 32.23 kg/m2 31.47 kg/m2 31.47 kg/m2

## 2019-05-23 NOTE — Assessment & Plan Note (Signed)
Uncontrolled , not at goal, generally within range re eval in 4 weeks before med adjustment  DASH diet and commitment to daily physical activity for a minimum of 30 minutes discussed and encouraged, as a part of hypertension management. The importance of attaining a healthy weight is also discussed.  BP/Weight 05/07/2019 05/03/2019 03/28/2019 01/16/2019 01/04/2019 10/18/2018 17/49/4496  Systolic BP 759 163 846 659 935 701 779  Diastolic BP 82 84 84 84 84 82 80  Wt. (Lbs) 212 207 207 207.12 207 208.6 207  BMI 32.23 31.47 31.47 31.49 31.47 31.72 31.47

## 2019-06-05 ENCOUNTER — Ambulatory Visit (INDEPENDENT_AMBULATORY_CARE_PROVIDER_SITE_OTHER): Payer: Medicare HMO | Admitting: Family Medicine

## 2019-06-05 ENCOUNTER — Encounter: Payer: Self-pay | Admitting: Family Medicine

## 2019-06-05 ENCOUNTER — Other Ambulatory Visit: Payer: Self-pay

## 2019-06-05 VITALS — BP 128/80 | HR 88 | Temp 98.4°F | Resp 15 | Ht 68.0 in | Wt 212.1 lb

## 2019-06-05 DIAGNOSIS — I10 Essential (primary) hypertension: Secondary | ICD-10-CM

## 2019-06-05 DIAGNOSIS — Z125 Encounter for screening for malignant neoplasm of prostate: Secondary | ICD-10-CM

## 2019-06-05 DIAGNOSIS — E1165 Type 2 diabetes mellitus with hyperglycemia: Secondary | ICD-10-CM

## 2019-06-05 DIAGNOSIS — Z794 Long term (current) use of insulin: Secondary | ICD-10-CM

## 2019-06-05 DIAGNOSIS — E785 Hyperlipidemia, unspecified: Secondary | ICD-10-CM

## 2019-06-05 DIAGNOSIS — E663 Overweight: Secondary | ICD-10-CM

## 2019-06-05 NOTE — Patient Instructions (Addendum)
Follow-up with MD second week in October call if you need me sooner.    Congratulations on much improved blood sugar values continue current doses of medications which are Lantus 10 medic 10 units daily Metformin 1000 mg twice daily and glipizide 10 mg twice daily.  Continue to the test 3 times daily at least and record thank you  Blood work October 1 or 2 to please lipid profile CMP and EGFR hemoglobin A1c, TSH and pSA  Pls provide an additional log book to the patient, doing extremely well! Good foot exam today  Thanks for choosing [facility], we consider it a privelige to serve you. It is important that you exercise regularly at least 30 minutes 5 times a week. If you develop chest pain, have severe difficulty breathing, or feel very tired, stop exercising immediately and seek medical attention

## 2019-06-06 MED ORDER — INSULIN PEN NEEDLE 32G X 4 MM MISC: 3 refills | Status: DC

## 2019-06-10 ENCOUNTER — Encounter: Payer: Self-pay | Admitting: Family Medicine

## 2019-06-10 MED ORDER — TRAMADOL HCL 50 MG PO TABS: ORAL_TABLET | ORAL | 5 refills | Status: DC

## 2019-06-10 NOTE — Progress Notes (Signed)
Norman Peters     MRN: [id number]      DOB: 07-15-1956   HPI Norman Peters is here for follow up and re-evaluation of chronic medical conditions,in particular uncontrolled diabetes, he has his log, has been testing and recording on average 3 times daily. His meter has been  Checked with the meter in the office , and his blood sugar readings range from 120 to 230 , with fairly good control overall. Lowest recorded was 80 once only.Feels  Better , and in more control of his blood sugar. Denies polyuria, polydipsia, blurred vision , or hypoglycemic episodes.  Preventive health is updated, specifically  Cancer screening and Immunization.   Questions or concerns regarding consultations or procedures which the PT has had in the interim are  addressed. The PT denies any adverse reactions to current medications since the last visit.    ROS Denies recent fever or chills. Denies sinus pressure, nasal congestion, ear pain or sore throat. Denies chest congestion, productive cough or wheezing. Denies chest pains, palpitations and leg swelling Denies abdominal pain, nausea, vomiting,diarrhea or constipation.   Denies dysuria, frequency, hesitancy or incontinence. Denies joint pain, swelling and limitation in mobility. Denies headaches, seizures, numbness, or tingling. Denies uncontrolled depression, anxiety or insomnia. Denies skin break down or rash.   PE  BP 128/80   Pulse 88   Temp 98.4 F (36.9 C) (Temporal)   Resp 15   Ht 5\' 8"  (1.727 m)   Wt 212 lb 1.9 oz (96.2 kg)   SpO2 96%   BMI 32.25 kg/m   Patient alert and oriented and in no cardiopulmonary distress.  HEENT: No facial asymmetry, EOMI,   oropharynx pink and moist.  Neck supple no JVD, no mass.  Chest: Clear to auscultation bilaterally.  CVS: S1, S2 no murmurs, no S3.Regular rate.  ABD: Soft non tender.   Ext: No edema  MS: Adequate ROM spine, shoulders, hips and knees.  Skin: Intact, no ulcerations or rash noted.   Psych: Good eye contact, normal affect. Memory intact not anxious or depressed appearing.  CNS: CN 2-12 intact, power,  normal throughout.no focal deficits noted.   Assessment & Plan  Type 2 diabetes mellitus with hyperglycemia (HCC) Improved control on insulin based on diary provided, no med change. HBA1C when due with close in office f/u, call in between with concerns  Hypertension goal BP (blood pressure) < 130/80 Controlled, no change in medication DASH diet and commitment to daily physical activity for a minimum of 30 minutes discussed and encouraged, as a part of hypertension management. The importance of attaining a healthy weight is also discussed.  BP/Weight 06/05/2019 05/07/2019 05/03/2019 03/28/2019 01/16/2019 01/04/2019 99/35/7017  Systolic BP 793 903 009 233 007 622 633  Diastolic BP 80 82 84 84 84 84 82  Wt. (Lbs) 212.12 212 207 207 207.12 207 208.6  BMI 32.25 32.23 31.47 31.47 31.49 31.47 31.72       Hyperlipidemia LDL goal <100 Hyperlipidemia:Low fat diet discussed and encouraged.   Lipid Panel  Lab Results  Component Value Date   CHOL 112 01/04/2019   HDL 33 (L) 01/04/2019   LDLCALC 46 01/04/2019   TRIG 313 (H) 01/04/2019   CHOLHDL 3.4 01/04/2019   Needs to increase exercise and lower fat intake    Overweight (BMI 25.0-29.9)  Patient re-educated about  the importance of commitment to a  minimum of 150 minutes of exercise per week as able.  The importance of healthy food choices with portion  control discussed, as well as eating regularly and within a 12 hour window most days. The need to choose "clean , [place]" food 50 to 75% of the time is discussed, as well as to make water the primary drink and set a goal of 64 ounces water daily.    Weight /BMI 06/05/2019 05/07/2019 05/03/2019  WEIGHT 212 lb 1.9 oz 212 lb 207 lb  HEIGHT 5\' 8"  5\' 8"  5\' 8"   BMI 32.25 kg/m2 32.23 kg/m2 31.47 kg/m2

## 2019-06-10 NOTE — Assessment & Plan Note (Signed)
Hyperlipidemia:Low fat diet discussed and encouraged.   Lipid Panel  Lab Results  Component Value Date   CHOL 112 01/04/2019   HDL 33 (L) 01/04/2019   LDLCALC 46 01/04/2019   TRIG 313 (H) 01/04/2019   CHOLHDL 3.4 01/04/2019   Needs to increase exercise and lower fat intake

## 2019-06-10 NOTE — Assessment & Plan Note (Signed)
  Patient re-educated about  the importance of commitment to a  minimum of 150 minutes of exercise per week as able.  The importance of healthy food choices with portion control discussed, as well as eating regularly and within a 12 hour window most days. The need to choose "clean , [place]" food 50 to 75% of the time is discussed, as well as to make water the primary drink and set a goal of 64 ounces water daily.    Weight /BMI 06/05/2019 05/07/2019 05/03/2019  WEIGHT 212 lb 1.9 oz 212 lb 207 lb  HEIGHT 5\' 8"  5\' 8"  5\' 8"   BMI 32.25 kg/m2 32.23 kg/m2 31.47 kg/m2

## 2019-06-10 NOTE — Assessment & Plan Note (Signed)
Improved control on insulin based on diary provided, no med change. HBA1C when due with close in office f/u, call in between with concerns

## 2019-06-10 NOTE — Assessment & Plan Note (Signed)
Controlled, no change in medication DASH diet and commitment to daily physical activity for a minimum of 30 minutes discussed and encouraged, as a part of hypertension management. The importance of attaining a healthy weight is also discussed.  BP/Weight 06/05/2019 05/07/2019 05/03/2019 03/28/2019 01/16/2019 01/04/2019 15/94/5859  Systolic BP 292 446 286 381 771 165 790  Diastolic BP 80 82 84 84 84 84 82  Wt. (Lbs) 212.12 212 207 207 207.12 207 208.6  BMI 32.25 32.23 31.47 31.47 31.49 31.47 31.72

## 2019-06-18 ENCOUNTER — Other Ambulatory Visit: Payer: Self-pay

## 2019-06-18 ENCOUNTER — Encounter: Payer: Self-pay | Admitting: Family Medicine

## 2019-06-18 ENCOUNTER — Telehealth: Payer: Self-pay | Admitting: Family Medicine

## 2019-06-18 ENCOUNTER — Telehealth: Payer: Self-pay | Admitting: "Endocrinology

## 2019-06-18 ENCOUNTER — Ambulatory Visit (INDEPENDENT_AMBULATORY_CARE_PROVIDER_SITE_OTHER): Payer: Medicare HMO | Admitting: Family Medicine

## 2019-06-18 VITALS — BP 138/80 | HR 95 | Temp 97.7°F | Resp 15 | Ht 68.0 in | Wt 213.0 lb

## 2019-06-18 DIAGNOSIS — Z794 Long term (current) use of insulin: Secondary | ICD-10-CM | POA: Diagnosis not present

## 2019-06-18 DIAGNOSIS — G8929 Other chronic pain: Secondary | ICD-10-CM

## 2019-06-18 DIAGNOSIS — E1165 Type 2 diabetes mellitus with hyperglycemia: Secondary | ICD-10-CM

## 2019-06-18 DIAGNOSIS — M25522 Pain in left elbow: Secondary | ICD-10-CM | POA: Diagnosis not present

## 2019-06-18 DIAGNOSIS — I10 Essential (primary) hypertension: Secondary | ICD-10-CM

## 2019-06-18 DIAGNOSIS — E663 Overweight: Secondary | ICD-10-CM | POA: Diagnosis not present

## 2019-06-18 MED ORDER — INSULIN GLARGINE 100 UNIT/ML SOLOSTAR PEN: 25.0000 [IU] | PEN_INJECTOR | Freq: Every day | SUBCUTANEOUS | 11 refills | Status: DC

## 2019-06-18 NOTE — Telephone Encounter (Signed)
noted 

## 2019-06-18 NOTE — Patient Instructions (Signed)
F/U in October as before, flu vaccine at visit, call if you need me sooner  INCREASE lantus to 25 units daily   It is important that you exercise regularly at least 30 minutes 5 times a week. If you develop chest pain, have severe difficulty breathing, or feel very tired, stop exercising immediately and seek medical attention    Think about what you will eat, plan ahead. Choose " clean, [place], fresh or frozen" over canned, processed or packaged foods which are more sugary, salty and fatty. 70 to 75% of food eaten should be vegetables and fruit. Three meals at set times with snacks allowed between meals, but they must be fruit or vegetables. Aim to eat over a 12 hour period , example 7 am to 7 pm, and STOP after  your last meal of the day. Drink water,generally about 64 ounces per day, no other drink is as healthy. Fruit juice is best enjoyed in a healthy way, by EATING the fruit. Thanks for choosing [facility], we consider it a privelige to serve you.

## 2019-06-18 NOTE — Telephone Encounter (Signed)
Pt has a Hospital doctor Visit tomorrow with Mrs Norman Peters. PT wanted you to know

## 2019-06-18 NOTE — Telephone Encounter (Signed)
Patient needs a new referral placed for patient for [facility]. He has an appt tomrrow.thanks:)

## 2019-06-18 NOTE — Progress Notes (Signed)
Norman Peters     MRN: [id number]      DOB: 1956-10-01   HPI [name] is here for follow up and re-evaluation of chronic medical conditions, medication management and review of any available recent lab and radiology data.  States fasting sugars range from 130 to 150, this morning reports continually risiing numbers despite not having eaten C/o left elbow pin and reduced mobility, no known trauma ROS Denies recent fever or chills. Denies sinus pressure, nasal congestion, ear pain or sore throat. Denies chest congestion, productive cough or wheezing. Denies chest pains, palpitations and leg swelling Denies abdominal pain, nausea, vomiting,diarrhea or constipation.   Denies dysuria, frequency, hesitancy or incontinence.  Denies headaches, seizures, numbness, or tingling. Denies depression, anxiety or insomnia. Denies skin break down or rash.   PE  BP 138/80   Pulse 95   Temp 97.7 F (36.5 C) (Temporal)   Resp 15   Ht 5\' 8"  (1.727 m)   Wt 213 lb (96.6 kg)   SpO2 95%   BMI 32.39 kg/m   Patient alert and oriented and in no cardiopulmonary distress.  HEENT: No facial asymmetry, EOMI,   oropharynx pink and moist.  Neck supple no JVD, no mass.  Chest: Clear to auscultation bilaterally.  CVS: S1, S2 no murmurs, no S3.Regular rate.  ABD: Soft non tender.   Ext: No edema  MS: Adequate ROM spine, shoulders, hips and knees.Decreased ROM left elbow  Skin: Intact, no ulcerations or rash noted.  Psych: Good eye contact, normal affect. Memory intact not anxious or depressed appearing.  CNS: CN 2-12 intact, power,  normal throughout.no focal deficits noted.   Assessment & Plan  Type 2 diabetes mellitus with hyperglycemia (HCC) Uncontrolled but improving , increase lantus to 25 units daily and test and record 3 times daily Mr. Parco is reminded of the importance of commitment to daily physical activity for 30 minutes or more, as able and the need to limit carbohydrate intake  to 30 to 60 grams per meal to help with blood sugar control.   The need to take medication as prescribed, test blood sugar as directed, and to call between visits if there is a concern that blood sugar is uncontrolled is also discussed.   Mr. Klutz is reminded of the importance of daily foot exam, annual eye examination, and good blood sugar, blood pressure and cholesterol control.  Diabetic Labs Latest Ref Rng & Units 05/03/2019 01/04/2019 11/29/2018 09/19/2018 08/31/2018  HbA1c <5.7 % of total Hgb 11.3(H) - 9.1(H) 8.9(H) -  Microalbumin mg/dL - - - 83.1 844.2(H)  Micro/Creat Ratio 0.0 - 30.0 mg/g creat - - - - 304.9(H)  Chol <200 mg/dL - 112 - 108 -  HDL > OR = 40 mg/dL - 33(L) - 36(L) -  Calc LDL mg/dL (calc) - 46 - 47 -  Triglycerides <150 mg/dL - 313(H) - 172(H) -  Creatinine 0.70 - 1.25 mg/dL 1.26(H) 1.30(H) 1.56(H) 1.21 -   BP/Weight 06/18/2019 06/05/2019 05/07/2019 05/03/2019 03/28/2019 01/16/2019 06/16/9832  Systolic BP 825 053 976 734 193 790 240  Diastolic BP 80 80 82 84 84 84 84  Wt. (Lbs) 213 212.12 212 207 207 207.12 207  BMI 32.39 32.25 32.23 31.47 31.47 31.49 31.47   Foot/eye exam completion dates Latest Ref Rng & Units 06/05/2019 05/22/2018  Eye Exam No Retinopathy - -  Foot Form Completion - Done Done    Updated lab needed at/ before next visit.     Overweight (BMI 25.0-29.9)  Patient re-educated about  the importance of commitment to a  minimum of 150 minutes of exercise per week as able.  The importance of healthy food choices with portion control discussed, as well as eating regularly and within a 12 hour window most days. The need to choose "clean , [place]" food 50 to 75% of the time is discussed, as well as to make water the primary drink and set a goal of 64 ounces water daily.    Weight /BMI 06/18/2019 06/05/2019 05/07/2019  WEIGHT 213 lb 212 lb 1.9 oz 212 lb  HEIGHT 5\' 8"  5\' 8"  5\' 8"   BMI 32.39 kg/m2 32.25 kg/m2 32.23 kg/m2      Hypertension goal BP (blood  pressure) < 130/80 Controlled, no change in medication DASH diet and commitment to daily physical activity for a minimum of 30 minutes discussed and encouraged, as a part of hypertension management. The importance of attaining a healthy weight is also discussed.  BP/Weight 06/18/2019 06/05/2019 05/07/2019 05/03/2019 03/28/2019 01/16/2019 9/75/8832  Systolic BP 549 826 415 830 940 768 088  Diastolic BP 80 80 82 84 84 84 84  Wt. (Lbs) 213 212.12 212 207 207 207.12 207  BMI 32.39 32.25 32.23 31.47 31.47 31.49 31.47       Chronic elbow pain, left 2 month history, no trauma, advised tylenol and exercises, pain due to arthritis

## 2019-06-19 ENCOUNTER — Encounter: Payer: Medicare HMO | Attending: "Endocrinology | Admitting: Nutrition

## 2019-06-19 DIAGNOSIS — E1165 Type 2 diabetes mellitus with hyperglycemia: Secondary | ICD-10-CM

## 2019-06-19 DIAGNOSIS — E663 Overweight: Secondary | ICD-10-CM | POA: Insufficient documentation

## 2019-06-19 DIAGNOSIS — E785 Hyperlipidemia, unspecified: Secondary | ICD-10-CM | POA: Insufficient documentation

## 2019-06-19 DIAGNOSIS — I10 Essential (primary) hypertension: Secondary | ICD-10-CM | POA: Insufficient documentation

## 2019-06-23 ENCOUNTER — Encounter: Payer: Self-pay | Admitting: Family Medicine

## 2019-06-23 DIAGNOSIS — M25522 Pain in left elbow: Secondary | ICD-10-CM | POA: Insufficient documentation

## 2019-06-23 DIAGNOSIS — G8929 Other chronic pain: Secondary | ICD-10-CM | POA: Insufficient documentation

## 2019-06-23 NOTE — Assessment & Plan Note (Signed)
  Patient re-educated about  the importance of commitment to a  minimum of 150 minutes of exercise per week as able.  The importance of healthy food choices with portion control discussed, as well as eating regularly and within a 12 hour window most days. The need to choose "clean , [place]" food 50 to 75% of the time is discussed, as well as to make water the primary drink and set a goal of 64 ounces water daily.    Weight /BMI 06/18/2019 06/05/2019 05/07/2019  WEIGHT 213 lb 212 lb 1.9 oz 212 lb  HEIGHT 5\' 8"  5\' 8"  5\' 8"   BMI 32.39 kg/m2 32.25 kg/m2 32.23 kg/m2

## 2019-06-23 NOTE — Assessment & Plan Note (Signed)
2 month history, no trauma, advised tylenol and exercises, pain due to arthritis

## 2019-06-23 NOTE — Assessment & Plan Note (Signed)
Controlled, no change in medication DASH diet and commitment to daily physical activity for a minimum of 30 minutes discussed and encouraged, as a part of hypertension management. The importance of attaining a healthy weight is also discussed.  BP/Weight 06/18/2019 06/05/2019 05/07/2019 05/03/2019 03/28/2019 01/16/2019 0/35/2481  Systolic BP 859 093 112 162 446 950 722  Diastolic BP 80 80 82 84 84 84 84  Wt. (Lbs) 213 212.12 212 207 207 207.12 207  BMI 32.39 32.25 32.23 31.47 31.47 31.49 31.47

## 2019-06-23 NOTE — Assessment & Plan Note (Signed)
Uncontrolled but improving , increase lantus to 25 units daily and test and record 3 times daily Norman Peters is reminded of the importance of commitment to daily physical activity for 30 minutes or more, as able and the need to limit carbohydrate intake to 30 to 60 grams per meal to help with blood sugar control.   The need to take medication as prescribed, test blood sugar as directed, and to call between visits if there is a concern that blood sugar is uncontrolled is also discussed.   Norman Peters is reminded of the importance of daily foot exam, annual eye examination, and good blood sugar, blood pressure and cholesterol control.  Diabetic Labs Latest Ref Rng & Units 05/03/2019 01/04/2019 11/29/2018 09/19/2018 08/31/2018  HbA1c <5.7 % of total Hgb 11.3(H) - 9.1(H) 8.9(H) -  Microalbumin mg/dL - - - 83.1 844.2(H)  Micro/Creat Ratio 0.0 - 30.0 mg/g creat - - - - 304.9(H)  Chol <200 mg/dL - 112 - 108 -  HDL > OR = 40 mg/dL - 33(L) - 36(L) -  Calc LDL mg/dL (calc) - 46 - 47 -  Triglycerides <150 mg/dL - 313(H) - 172(H) -  Creatinine 0.70 - 1.25 mg/dL 1.26(H) 1.30(H) 1.56(H) 1.21 -   BP/Weight 06/18/2019 06/05/2019 05/07/2019 05/03/2019 03/28/2019 01/16/2019 3/83/2919  Systolic BP 166 060 045 997 741 423 953  Diastolic BP 80 80 82 84 84 84 84  Wt. (Lbs) 213 212.12 212 207 207 207.12 207  BMI 32.39 32.25 32.23 31.47 31.47 31.49 31.47   Foot/eye exam completion dates Latest Ref Rng & Units 06/05/2019 05/22/2018  Eye Exam No Retinopathy - -  Foot Form Completion - Done Done    Updated lab needed at/ before next visit.

## 2019-06-25 ENCOUNTER — Other Ambulatory Visit: Payer: Self-pay | Admitting: Family Medicine

## 2019-06-27 ENCOUNTER — Encounter: Payer: Medicare HMO | Admitting: Nutrition

## 2019-06-27 DIAGNOSIS — E663 Overweight: Secondary | ICD-10-CM

## 2019-06-27 DIAGNOSIS — E785 Hyperlipidemia, unspecified: Secondary | ICD-10-CM

## 2019-06-27 DIAGNOSIS — E1165 Type 2 diabetes mellitus with hyperglycemia: Secondary | ICD-10-CM

## 2019-06-27 DIAGNOSIS — I10 Essential (primary) hypertension: Secondary | ICD-10-CM

## 2019-06-27 NOTE — Progress Notes (Signed)
Diabetes Self-Management Education  Visit Type:  F/u  Appt. Start Time: 130Appt. End Time: 1430  08/06/2019  Norman Peters, identified by name and date of birth, is a 63 y.o. male with a diagnosis of Diabetes:    Metformin 1000 mg BID, GLipizide 10 mg BID, 25 units of Lantus daily.  FBS in am 143, 102, 140, Lunch 174, 155  Before dinner 106, 119  Bedtime 125, 140 mg Metformin 1000 mg BId. GLipizide  Lantus 25 units a day. He is doing much better.   B) chicken, cherrios orange, milk, L)raveloi  cheese, lettuce and tomato, milk D) meat, vegetales, fruit, water  He is eating better balanced meals, BS are better.  Lab Results  Component Value Date   HGBA1C 11.3 (H) 05/03/2019    ASSESSMENT  Wt Readings from Last 3 Encounters:  07/05/19 213 lb (96.6 kg)  06/18/19 213 lb (96.6 kg)  06/05/19 212 lb 1.9 oz (96.2 kg)   Ht Readings from Last 3 Encounters:  07/05/19 5\' 8"  (1.727 m)  06/18/19 5\' 8"  (1.727 m)  06/05/19 5\' 8"  (1.727 m)   There is no height or weight on file to calculate BMI. @BMIFA @ Facility age limit for growth percentiles is 20 years. Facility age limit for growth percentiles is 20 years. Lipid Panel     Component Value Date/Time   CHOL 112 01/04/2019 1009   TRIG 313 (H) 01/04/2019 1009   HDL 33 (L) 01/04/2019 1009   CHOLHDL 3.4 01/04/2019 1009   VLDL 39 (H) 05/14/2017 0831   LDLCALC 46 01/04/2019 1009   CMP Latest Ref Rng & Units 05/03/2019 01/04/2019 11/29/2018  Glucose 65 - 139 mg/dL 231(H) 233(H) 220(H)  BUN 7 - 25 mg/dL 15 19 17   Creatinine 0.70 - 1.25 mg/dL 1.26(H) 1.30(H) 1.56(H)  Sodium 135 - 146 mmol/L 135 136 137  Potassium 3.5 - 5.3 mmol/L 4.7 4.7 4.6  Chloride 98 - 110 mmol/L 98 99 98  CO2 20 - 32 mmol/L 27 28 28   Calcium 8.6 - 10.3 mg/dL 9.6 10.2 9.7  Total Protein 6.1 - 8.1 g/dL - 7.5 -  Total Bilirubin 0.2 - 1.2 mg/dL - 0.4 -  Alkaline Phos 40 - 115 U/L - - -  AST 10 - 35 U/L - 10 -  ALT 9 - 46 U/L - 13 -        Individualized Plan for Diabetes Self-Management Training:   Learning Objective:  Patient will have a greater understanding of diabetes self-management. Patient education plan is to attend individual and/or group sessions per assessed needs and concerns.   Plan:  Goals 1. Cut out sweetts 2. Increase exercise 3. Test twice a day, in am and before bed Get A1C down to 7% or less.   Expected Outcomes:    Improved self management of his DM.  Education material provided: Living Well with Diabetes, A1C conversion sheet, Meal plan card, My Plate and Carbohydrate counting sheet  If problems or questions, patient to contact team via:  Phone and Email  Future DSME appointment:  3 months. Making excellent progress.

## 2019-06-30 ENCOUNTER — Other Ambulatory Visit: Payer: Self-pay | Admitting: Family Medicine

## 2019-06-30 DIAGNOSIS — I10 Essential (primary) hypertension: Secondary | ICD-10-CM

## 2019-07-02 ENCOUNTER — Ambulatory Visit (INDEPENDENT_AMBULATORY_CARE_PROVIDER_SITE_OTHER): Payer: Medicare HMO

## 2019-07-02 ENCOUNTER — Other Ambulatory Visit: Payer: Self-pay

## 2019-07-02 DIAGNOSIS — Z23 Encounter for immunization: Secondary | ICD-10-CM

## 2019-07-05 ENCOUNTER — Encounter: Payer: Self-pay | Admitting: Family Medicine

## 2019-07-05 ENCOUNTER — Other Ambulatory Visit: Payer: Self-pay

## 2019-07-05 ENCOUNTER — Ambulatory Visit (INDEPENDENT_AMBULATORY_CARE_PROVIDER_SITE_OTHER): Payer: Medicare HMO | Admitting: Family Medicine

## 2019-07-05 VITALS — BP 140/80 | HR 91 | Temp 99.6°F | Resp 14 | Ht 68.0 in | Wt 213.0 lb

## 2019-07-05 DIAGNOSIS — K0889 Other specified disorders of teeth and supporting structures: Secondary | ICD-10-CM

## 2019-07-05 DIAGNOSIS — I10 Essential (primary) hypertension: Secondary | ICD-10-CM

## 2019-07-05 MED ORDER — AMOXICILLIN-POT CLAVULANATE 875-125 MG PO TABS: 1.0000 | ORAL_TABLET | Freq: Two times a day (BID) | ORAL | 0 refills | Status: AC

## 2019-07-05 NOTE — Progress Notes (Signed)
Subjective:     Patient ID: Norman Peters, male   DOB: 25-Apr-1956, 63 y.o.   MRN: [id number]  Norman Peters presents for Dental Pain  Norman Peters is a 63 year old male patient with a history of arthritis, diabetes, GERD, hyperlipidemia and depression.  Has a history of poor dental care with multiple cavities and missing teeth.  Back in February he was seen for dental pain and over the course of the last months he has had multiple teeth removed and is working on getting 1 of the last couple removed and getting a partial made.  He had to do this in stages as he does not have insurance coverage for this particular type of care.  Today he presents with left topside back molar giving him discomfort and pain that started 2 nights ago.  He reports that it is a similar type pain he had in the previous ones before he got them pulled.  He is set to go back next Thursday, September 3 to the dentist to have this tooth pulled.  But he is unable to get there any sooner secondary to income.  Wanted to see about getting something for discomfort and infection.  Reported that recent antibiotics used in February were very helpful prior to getting his teeth pulled.  Otherwise he feels okay today and is not having any other signs or symptoms to report.  Today patient denies signs and symptoms of COVID 19 infection including fever, chills, cough, shortness of breath, and headache.  Past Medical, Surgical, Social History, Allergies, and Medications have been Reviewed.   Past Medical History:  Diagnosis Date  . Anxiety   . Arthritis   . Depression 2001  . Diabetes mellitus 2010  . GERD (gastroesophageal reflux disease)   . Hyperlipidemia   . Hypertension 2005   Past Surgical History:  Procedure Laterality Date  . COLONOSCOPY N/A 07/07/2016   Procedure: COLONOSCOPY;  Surgeon: Rogene Houston, MD;  Location: [facility];  Service: Endoscopy;  Laterality: N/A;  1030  . FINGER SURGERY Left 1974   fifth , trauma  on job, had to replace/repair finger  . HERNIA REPAIR  [id number]   x3, umbilical and bilateral hernia   Social History   Socioeconomic History  . Marital status: Married    Spouse name: Not on file  . Number of children: Not on file  . Years of education: Not on file  . Highest education level: Not on file  Occupational History  . Not on file  Social Needs  . Financial resource strain: Not hard at all  . Food insecurity    Worry: Never true    Inability: Never true  . Transportation needs    Medical: No    Non-medical: No  Tobacco Use  . Smoking status: Former Smoker    Quit date: 01/20/2011    Years since quitting: 8.4  . Smokeless tobacco: Never Used  Substance and Sexual Activity  . Alcohol use: No  . Drug use: No  . Sexual activity: Not Currently  Lifestyle  . Physical activity    Days per week: 3 days    Minutes per session: 40 min  . Stress: Only a little  Relationships  . Social Herbalist on phone: Three times a week    Gets together: Never    Attends religious service: More than 4 times per year    Active member of club or organization: Yes    Attends meetings  of clubs or organizations: More than 4 times per year    Relationship status: Married  . Intimate partner violence    Fear of current or ex partner: No    Emotionally abused: No    Physically abused: No    Forced sexual activity: No  Other Topics Concern  . Not on file  Social History Narrative  . Not on file    Outpatient Encounter Medications as of 07/05/2019  Medication Sig  . amLODipine (NORVASC) 10 MG tablet TAKE 1 TABLET EVERY DAY  . aspirin EC 81 MG tablet Take 81 mg by mouth daily.  . busPIRone (BUSPAR) 5 MG tablet TAKE 1 TABLET TWICE DAILY  . Cholecalciferol (VITAMIN D3) 400 units CAPS Take 2 capsules by mouth daily.  Marland Kitchen FLUoxetine (PROZAC) 10 MG capsule TAKE 1 CAPSULE EVERY DAY (DOSE INCREASE, 30 MG TOTAL)  . FLUoxetine (PROZAC) 20 MG capsule TAKE 1 CAPSULE EVERY DAY  .  glipiZIDE (GLUCOTROL) 10 MG tablet TAKE 1 TABLET TWICE DAILY BEFORE A MEAL  . Insulin Glargine (LANTUS) 100 UNIT/ML Solostar Pen Inject 25 Units into the skin daily.  . Insulin Pen Needle 32G X 4 MM MISC Use daily to inject insulin DX e11.65  . metFORMIN (GLUCOPHAGE) 1000 MG tablet TAKE 1 TABLET TWICE DAILY WITH A MEAL  . pravastatin (PRAVACHOL) 10 MG tablet TAKE 1 TABLET EVERY DAY  . spironolactone (ALDACTONE) 50 MG tablet TAKE 1 TABLET EVERY DAY  . tadalafil (CIALIS) 10 MG tablet Take 1 tablet (10 mg total) by mouth daily as needed for erectile dysfunction.  . traMADol (ULTRAM) 50 MG tablet Take one tablet by mouth once daily, as needed, for joint pain  . TRUE METRIX BLOOD GLUCOSE TEST test strip TEST ONE TIME DAILY  . TRUEplus Lancets 28G MISC USE  TO  TEST ONE TIME DAILY  . amoxicillin-clavulanate (AUGMENTIN) 875-125 MG tablet Take 1 tablet by mouth 2 (two) times daily for 7 days.   No facility-administered encounter medications on file as of 07/05/2019.    Allergies  Allergen Reactions  . Ibuprofen Rash  . Lisinopril Cough  . Other Other (See Comments)  . Strawberry [Berry]     Review of Systems  Constitutional: Negative for chills and fever.  HENT: Positive for dental problem.        Not hard to eat, but pain in jaw over tooth and in throat. Throbs at night  Eyes: Negative.   Respiratory: Negative.  Negative for cough and shortness of breath.   Cardiovascular: Negative.   Gastrointestinal: Negative.   Endocrine: Negative.   Genitourinary: Negative.   Musculoskeletal: Negative.   Skin: Negative.   Allergic/Immunologic: Negative.   Neurological: Negative.   Hematological: Negative.   Psychiatric/Behavioral: Negative.   All other systems reviewed and are negative.      Objective:     Pulse 91   Temp 99.6 F (37.6 C) (Temporal)   Resp 14   Ht 5\' 8"  (1.727 m)   Wt 213 lb (96.6 kg)   SpO2 96%   BMI 32.39 kg/m   Physical Exam Vitals signs and nursing note  reviewed.  Constitutional:      Appearance: Normal appearance. He is well-developed and well-groomed. He is obese.  HENT:     Head: Normocephalic and atraumatic.     Right Ear: External ear normal.     Left Ear: External ear normal.     Nose: Nose normal.     Mouth/Throat:     Mouth: Mucous membranes  are moist.     Dentition: Abnormal dentition. Dental tenderness and dental caries present. No dental abscesses.     Pharynx: Oropharynx is clear.     Comments: Redness, tenderness, and mild swelling of Left upper back molar with visual decay present. Tenderness also over jawline and throat Eyes:     General:        Right eye: No discharge.        Left eye: No discharge.     Conjunctiva/sclera: Conjunctivae normal.  Neck:     Musculoskeletal: Normal range of motion and neck supple.  Cardiovascular:     Rate and Rhythm: Normal rate and regular rhythm.     Pulses: Normal pulses.     Heart sounds: Normal heart sounds.  Pulmonary:     Effort: Pulmonary effort is normal.     Breath sounds: Normal breath sounds.  Musculoskeletal: Normal range of motion.  Lymphadenopathy:     Cervical: No cervical adenopathy.     Right cervical: No superficial, deep or posterior cervical adenopathy.    Left cervical: No superficial, deep or posterior cervical adenopathy.  Skin:    General: Skin is warm.  Neurological:     General: No focal deficit present.     Mental Status: He is alert and oriented to person, place, and time.  Psychiatric:        Attention and Perception: Attention normal.        Mood and Affect: Mood normal.        Speech: Speech normal.        Behavior: Behavior normal. Behavior is cooperative.        Thought Content: Thought content normal.        Cognition and Memory: Cognition normal.        Judgment: Judgment normal.        Assessment and Plan        1. Pain, dental Noted tenderness, redness and mild swelling of Left upper back molar, Decay present. Waiting to get  enough money to have pulled. Low grade temp  Provided with Augmentin for 7 days. He is suppose to get tooth pulled next Thursday. Educated about precautions in which to go to ED over weekend if needed.  - amoxicillin-clavulanate (AUGMENTIN) 875-125 MG tablet; Take 1 tablet by mouth 2 (two) times daily for 7 days.  Dispense: 14 tablet; Refill: 0  2. Hypertension goal BP (blood pressure) < 130/80 BP a bit higher than normal, might be pain related.  Will assess at future visits and adjust meds if needed.   Follow Up: 09/03/2019  Perlie Mayo, DNP, AGNP-BC [place], [place], [place] [address] Office Hours: Mon-Thurs 8 am-5 pm; Fri 8 am-12 pm Office Phone:  [phone]  Office Fax: [phone]

## 2019-07-05 NOTE — Patient Instructions (Addendum)
    Thank you for coming into the office today. I appreciate the opportunity to provide you with the care for your health and wellness. Today we discussed: dental pain  Follow Up: 09/03/2019- all ready scheduled   No labs today  Please pick up and take medication as directed.  If your fever is over 100 or you develop worse pain even on the antibiotics, please go to the ED. This kind of of infection might be in the bone.   Call Monday or Tuesday if you felt better, but got worse again. If no pain continue medication fully. And follow up with dentist once you can.  Please continue to practice social distancing to keep you, your family, and our community safe.  If you must go out, please wear a Mask and practice good handwashing.  [place] YOUR HANDS WELL AND FREQUENTLY. AVOID TOUCHING YOUR FACE, UNLESS YOUR HANDS ARE FRESHLY WASHED.  GET FRESH AIR DAILY. STAY HYDRATED WITH WATER.   It was a pleasure to see you and I look forward to continuing to work together on your health and well-being. Please do not hesitate to call the office if you need care or have questions about your care.  Have a wonderful day and week. With Gratitude, Cherly Beach, DNP, AGNP-BC

## 2019-07-10 ENCOUNTER — Ambulatory Visit: Payer: Medicare HMO | Admitting: Family Medicine

## 2019-07-11 ENCOUNTER — Telehealth: Payer: Self-pay | Admitting: *Deleted

## 2019-07-11 NOTE — Telephone Encounter (Signed)
Returned patients call. No answer. Left generic message requesting call back.  

## 2019-07-11 NOTE — Telephone Encounter (Signed)
Pt called said Norman Peters gave him antibiotics for his tooth last week. He has made the appt at the dentisit on 9-24 and wanted to know if Norman Peters would call him in one more round as he doesn't want the tooth to go back to hurting before 9-24.

## 2019-07-11 NOTE — Telephone Encounter (Signed)
Please advise 

## 2019-07-12 NOTE — Telephone Encounter (Signed)
Spoke with patient and went over treatment plant with verbal understanding.

## 2019-08-15 ENCOUNTER — Other Ambulatory Visit: Payer: Self-pay | Admitting: Family Medicine

## 2019-08-15 DIAGNOSIS — F419 Anxiety disorder, unspecified: Secondary | ICD-10-CM

## 2019-08-15 DIAGNOSIS — F329 Major depressive disorder, single episode, unspecified: Secondary | ICD-10-CM

## 2019-08-24 DIAGNOSIS — E1165 Type 2 diabetes mellitus with hyperglycemia: Secondary | ICD-10-CM | POA: Diagnosis not present

## 2019-08-24 DIAGNOSIS — Z125 Encounter for screening for malignant neoplasm of prostate: Secondary | ICD-10-CM | POA: Diagnosis not present

## 2019-08-24 DIAGNOSIS — Z794 Long term (current) use of insulin: Secondary | ICD-10-CM | POA: Diagnosis not present

## 2019-08-24 DIAGNOSIS — E785 Hyperlipidemia, unspecified: Secondary | ICD-10-CM | POA: Diagnosis not present

## 2019-08-25 LAB — COMPLETE METABOLIC PANEL WITH GFR
AG Ratio: 1.4 (calc) (ref 1.0–2.5)
ALT: 7 U/L — ABNORMAL LOW (ref 9–46)
AST: 8 U/L — ABNORMAL LOW (ref 10–35)
Albumin: 4 g/dL (ref 3.6–5.1)
Alkaline phosphatase (APISO): 68 U/L (ref 35–144)
BUN/Creatinine Ratio: 13 (calc) (ref 6–22)
BUN: 18 mg/dL (ref 7–25)
CO2: 28 mmol/L (ref 20–32)
Calcium: 9.6 mg/dL (ref 8.6–10.3)
Chloride: 102 mmol/L (ref 98–110)
Creat: 1.35 mg/dL — ABNORMAL HIGH (ref 0.70–1.25)
GFR, Est African American: 64 mL/min/{1.73_m2} (ref >=60)
GFR, Est Non African American: 55 mL/min/{1.73_m2} — ABNORMAL LOW (ref >=60)
Globulin: 2.8 g/dL (calc) (ref 1.9–3.7)
Glucose, Bld: 100 mg/dL — ABNORMAL HIGH (ref 65–99)
Potassium: 4.6 mmol/L (ref 3.5–5.3)
Sodium: 137 mmol/L (ref 135–146)
Total Bilirubin: 0.7 mg/dL (ref 0.2–1.2)
Total Protein: 6.8 g/dL (ref 6.1–8.1)

## 2019-08-25 LAB — LIPID PANEL
Cholesterol: 97 mg/dL (ref <200)
HDL: 32 mg/dL — ABNORMAL LOW (ref >=40)
LDL Cholesterol (Calc): 41 mg/dL (calc)
Non-HDL Cholesterol (Calc): 65 mg/dL (calc) (ref <130)
Total CHOL/HDL Ratio: 3 (calc) (ref <5.0)
Triglycerides: 164 mg/dL — ABNORMAL HIGH (ref <150)

## 2019-08-25 LAB — HEMOGLOBIN A1C
Hgb A1c MFr Bld: 8.5 % of total Hgb — ABNORMAL HIGH (ref <5.7)
Mean Plasma Glucose: 197 (calc)
eAG (mmol/L): 10.9 (calc)

## 2019-08-25 LAB — TSH: TSH: 1.96 mIU/L (ref 0.40–4.50)

## 2019-08-25 LAB — PSA: PSA: 1.4 ng/mL (ref <=4.0)

## 2019-09-03 ENCOUNTER — Other Ambulatory Visit: Payer: Self-pay

## 2019-09-03 ENCOUNTER — Encounter: Payer: Self-pay | Admitting: Family Medicine

## 2019-09-03 ENCOUNTER — Ambulatory Visit (INDEPENDENT_AMBULATORY_CARE_PROVIDER_SITE_OTHER): Payer: Medicare HMO | Admitting: Family Medicine

## 2019-09-03 VITALS — BP 130/84 | HR 94 | Temp 97.4°F | Ht 68.0 in | Wt 215.0 lb

## 2019-09-03 DIAGNOSIS — Z794 Long term (current) use of insulin: Secondary | ICD-10-CM

## 2019-09-03 DIAGNOSIS — E1169 Type 2 diabetes mellitus with other specified complication: Secondary | ICD-10-CM

## 2019-09-03 DIAGNOSIS — F419 Anxiety disorder, unspecified: Secondary | ICD-10-CM | POA: Diagnosis not present

## 2019-09-03 DIAGNOSIS — E66811 Obesity, class 1: Secondary | ICD-10-CM

## 2019-09-03 DIAGNOSIS — K029 Dental caries, unspecified: Secondary | ICD-10-CM

## 2019-09-03 DIAGNOSIS — E669 Obesity, unspecified: Secondary | ICD-10-CM | POA: Diagnosis not present

## 2019-09-03 DIAGNOSIS — E785 Hyperlipidemia, unspecified: Secondary | ICD-10-CM

## 2019-09-03 DIAGNOSIS — F329 Major depressive disorder, single episode, unspecified: Secondary | ICD-10-CM

## 2019-09-03 DIAGNOSIS — E1165 Type 2 diabetes mellitus with hyperglycemia: Secondary | ICD-10-CM | POA: Diagnosis not present

## 2019-09-03 DIAGNOSIS — F32A Depression, unspecified: Secondary | ICD-10-CM

## 2019-09-03 DIAGNOSIS — I10 Essential (primary) hypertension: Secondary | ICD-10-CM | POA: Diagnosis not present

## 2019-09-03 MED ORDER — AMOXICILLIN 500 MG PO CAPS: 500.0000 mg | ORAL_CAPSULE | Freq: Three times a day (TID) | ORAL | 0 refills | Status: DC

## 2019-09-03 NOTE — Assessment & Plan Note (Signed)
Controlled, no change in medication  

## 2019-09-03 NOTE — Progress Notes (Signed)
Norman Peters     MRN: [id number]      DOB: 22-Dec-1955   HPI Norman Peters is here for follow up and re-evaluation of chronic medical conditions, medication management and review of any available recent lab and radiology data.  Preventive health is updated, specifically  Cancer screening and Immunization.   Questions or concerns regarding consultations or procedures which the PT has had in the interim are  addressed. The PT denies any adverse reactions to current medications since the last visit.  Left jaw pain, ever since getting  Upper left jaw teeth being etracted. Has had amoxicilin in the past but no refills , got some relief     ROS Denies recent fever or chills. Denies sinus pressure, nasal congestion, ear pain or sore throat. Denies chest congestion, productive cough or wheezing. Denies chest pains, palpitations and leg swelling Denies abdominal pain, nausea, vomiting,diarrhea or constipation.   Denies dysuria, frequency, hesitancy or incontinence.  Denies headaches, seizures, numbness, or tingling. Denies depression, anxiety or insomnia. Denies skin break down or rash.   PE  BP 130/84   Pulse 94   Temp (!) 97.4 F (36.3 C) (Temporal)   Ht 5\' 8"  (1.727 m)   Wt 215 lb (97.5 kg)   SpO2 96%   BMI 32.69 kg/m   Patient alert and oriented and in no cardiopulmonary distress.  HEENT: No facial asymmetry, EOMI,     Neck supple .  Chest: Clear to auscultation bilaterally.  CVS: S1, S2 no murmurs, no S3.Regular rate.  ABD: Soft non tender.   Ext: No edema  MS: Adequate ROM spine, shoulders, hips and knees.  Skin: Intact, no ulcerations or rash noted.  Psych: Good eye contact, normal affect. Memory intact not anxious or depressed appearing.  CNS: CN 2-12 intact, power,  normal throughout.no focal deficits noted.   Assessment & Plan  Type 2 diabetes mellitus with hyperglycemia (HCC) Marked improvement , applauded on this, increase insulin dose to 30 units, continue  other meds as before Norman Peters is reminded of the importance of commitment to daily physical activity for 30 minutes or more, as able and the need to limit carbohydrate intake to 30 to 60 grams per meal to help with blood sugar control.   The need to take medication as prescribed, test blood sugar as directed, and to call between visits if there is a concern that blood sugar is uncontrolled is also discussed.   Norman Peters is reminded of the importance of daily foot exam, annual eye examination, and good blood sugar, blood pressure and cholesterol control.  Diabetic Labs Latest Ref Rng & Units 08/24/2019 05/03/2019 01/04/2019 11/29/2018 09/19/2018  HbA1c <5.7 % of total Hgb 8.5(H) 11.3(H) - 9.1(H) 8.9(H)  Microalbumin mg/dL - - - - 83.1  Micro/Creat Ratio 0.0 - 30.0 mg/g creat - - - - -  Chol <200 mg/dL 97 - 112 - 108  HDL > OR = 40 mg/dL 32(L) - 33(L) - 36(L)  Calc LDL mg/dL (calc) 41 - 46 - 47  Triglycerides <150 mg/dL 164(H) - 313(H) - 172(H)  Creatinine 0.70 - 1.25 mg/dL 1.35(H) 1.26(H) 1.30(H) 1.56(H) 1.21   BP/Weight 09/03/2019 07/05/2019 06/18/2019 06/05/2019 05/07/2019 05/03/2019 [id number]  Systolic BP [id number] 0000000 0000000 [id number]  Diastolic BP 84 80 80 80 82 84 84  Wt. (Lbs) 215 213 213 212.12 212 207 207  BMI 32.69 32.39 32.39 32.25 32.23 31.47 31.47   Foot/eye exam completion dates Latest Ref Rng &  Units 06/05/2019 05/22/2018  Eye Exam No Retinopathy - -  Foot Form Completion - Done Done        Anxiety and depression Controlled, no change in medication   Hypertension goal BP (blood pressure) < 130/80 Controlled, no change in medication DASH diet and commitment to daily physical activity for a minimum of 30 minutes discussed and encouraged, as a part of hypertension management. The importance of attaining a healthy weight is also discussed.  BP/Weight 09/03/2019 07/05/2019 06/18/2019 06/05/2019 05/07/2019 05/03/2019 [id number]  Systolic BP [id number] 0000000 0000000 [id number]  Diastolic BP  84 80 80 80 82 84 84  Wt. (Lbs) 215 213 213 212.12 212 207 207  BMI 32.69 32.39 32.39 32.25 32.23 31.47 31.47       Hyperlipidemia LDL goal <100 Hyperlipidemia:Low fat diet discussed and encouraged.   Lipid Panel  Lab Results  Component Value Date   CHOL 97 08/24/2019   HDL 32 (L) 08/24/2019   LDLCALC 41 08/24/2019   TRIG 164 (H) 08/24/2019   CHOLHDL 3.0 08/24/2019  needs to reduce fat and increase exercise     Obesity (BMI 30.0-34.9)  Patient re-educated about  the importance of commitment to a  minimum of 150 minutes of exercise per week as able.  The importance of healthy food choices with portion control discussed, as well as eating regularly and within a 12 hour window most days. The need to choose "clean , [place]" food 50 to 75% of the time is discussed, as well as to make water the primary drink and set a goal of 64 ounces water daily.    Weight /BMI 09/03/2019 07/05/2019 06/18/2019  WEIGHT 215 lb 213 lb 213 lb  HEIGHT 5\' 8"  5\' 8"  5\' 8"   BMI 32.69 kg/m2 32.39 kg/m2 32.39 kg/m2      Dental caries 1 week course of amoxicillin prescribed , he is to complete dental work in the next 4 to 6 weeks

## 2019-09-03 NOTE — Assessment & Plan Note (Signed)
  Patient re-educated about  the importance of commitment to a  minimum of 150 minutes of exercise per week as able.  The importance of healthy food choices with portion control discussed, as well as eating regularly and within a 12 hour window most days. The need to choose "clean , [place]" food 50 to 75% of the time is discussed, as well as to make water the primary drink and set a goal of 64 ounces water daily.    Weight /BMI 09/03/2019 07/05/2019 06/18/2019  WEIGHT 215 lb 213 lb 213 lb  HEIGHT 5\' 8"  5\' 8"  5\' 8"   BMI 32.69 kg/m2 32.39 kg/m2 32.39 kg/m2

## 2019-09-03 NOTE — Assessment & Plan Note (Signed)
Controlled, no change in medication DASH diet and commitment to daily physical activity for a minimum of 30 minutes discussed and encouraged, as a part of hypertension management. The importance of attaining a healthy weight is also discussed.  BP/Weight 09/03/2019 07/05/2019 06/18/2019 06/05/2019 05/07/2019 05/03/2019 [id number]  Systolic BP [id number] 0000000 0000000 [id number]  Diastolic BP 84 80 80 80 82 84 84  Wt. (Lbs) 215 213 213 212.12 212 207 207  BMI 32.69 32.39 32.39 32.25 32.23 31.47 31.47

## 2019-09-03 NOTE — Assessment & Plan Note (Signed)
Hyperlipidemia:Low fat diet discussed and encouraged.   Lipid Panel  Lab Results  Component Value Date   CHOL 97 08/24/2019   HDL 32 (L) 08/24/2019   LDLCALC 41 08/24/2019   TRIG 164 (H) 08/24/2019   CHOLHDL 3.0 08/24/2019  needs to reduce fat and increase exercise

## 2019-09-03 NOTE — Patient Instructions (Addendum)
Annual physical exam with MD in office first week in March, call if you need me before  CONGRATS on improved blood sugar  Increase lantus to 30 units every day  1 week course of amoxicillin is prescribed  Tylenol samples  Are being given, two per day  Microalb Nov 13 or after  It is important that you exercise regularly at least 30 minutes 5 times a week. If you develop chest pain, have severe difficulty breathing, or feel very tired, stop exercising immediately and seek medical attention    Non fast HBA1C, chem 7 and EGFr, CBC January 18, or after, we will call withJan celebration result, hBA1C goal is less than 8,you can do [place]!!

## 2019-09-03 NOTE — Assessment & Plan Note (Signed)
1 week course of amoxicillin prescribed , he is to complete dental work in the next 4 to 6 weeks

## 2019-09-03 NOTE — Assessment & Plan Note (Signed)
Marked improvement , applauded on this, increase insulin dose to 30 units, continue other meds as before Norman Peters is reminded of the importance of commitment to daily physical activity for 30 minutes or more, as able and the need to limit carbohydrate intake to 30 to 60 grams per meal to help with blood sugar control.   The need to take medication as prescribed, test blood sugar as directed, and to call between visits if there is a concern that blood sugar is uncontrolled is also discussed.   Norman Peters is reminded of the importance of daily foot exam, annual eye examination, and good blood sugar, blood pressure and cholesterol control.  Diabetic Labs Latest Ref Rng & Units 08/24/2019 05/03/2019 01/04/2019 11/29/2018 09/19/2018  HbA1c <5.7 % of total Hgb 8.5(H) 11.3(H) - 9.1(H) 8.9(H)  Microalbumin mg/dL - - - - 83.1  Micro/Creat Ratio 0.0 - 30.0 mg/g creat - - - - -  Chol <200 mg/dL 97 - 112 - 108  HDL > OR = 40 mg/dL 32(L) - 33(L) - 36(L)  Calc LDL mg/dL (calc) 41 - 46 - 47  Triglycerides <150 mg/dL 164(H) - 313(H) - 172(H)  Creatinine 0.70 - 1.25 mg/dL 1.35(H) 1.26(H) 1.30(H) 1.56(H) 1.21   BP/Weight 09/03/2019 07/05/2019 06/18/2019 06/05/2019 05/07/2019 05/03/2019 [id number]  Systolic BP [id number] 0000000 0000000 [id number]  Diastolic BP 84 80 80 80 82 84 84  Wt. (Lbs) 215 213 213 212.12 212 207 207  BMI 32.69 32.39 32.39 32.25 32.23 31.47 31.47   Foot/eye exam completion dates Latest Ref Rng & Units 06/05/2019 05/22/2018  Eye Exam No Retinopathy - -  Foot Form Completion - Done Done

## 2019-09-14 ENCOUNTER — Encounter: Payer: Self-pay | Admitting: Family Medicine

## 2019-09-14 ENCOUNTER — Other Ambulatory Visit: Payer: Self-pay

## 2019-09-14 ENCOUNTER — Ambulatory Visit (INDEPENDENT_AMBULATORY_CARE_PROVIDER_SITE_OTHER): Payer: Medicare HMO | Admitting: Family Medicine

## 2019-09-14 VITALS — BP 130/84 | HR 96 | Temp 98.9°F | Resp 15 | Ht 68.0 in | Wt 215.1 lb

## 2019-09-14 DIAGNOSIS — I10 Essential (primary) hypertension: Secondary | ICD-10-CM | POA: Diagnosis not present

## 2019-09-14 DIAGNOSIS — K029 Dental caries, unspecified: Secondary | ICD-10-CM | POA: Diagnosis not present

## 2019-09-14 MED ORDER — AMOXICILLIN-POT CLAVULANATE 875-125 MG PO TABS: 1.0000 | ORAL_TABLET | Freq: Two times a day (BID) | ORAL | 0 refills | Status: AC

## 2019-09-14 NOTE — Patient Instructions (Signed)
  I appreciate the opportunity to provide you with care for your health and wellness. Today we discussed: dental pain/carries  Follow up: 01/09/2020  No labs or referrals today  New antibiotic was sent to [facility].   Please pick this medication up and take as directed.   Make sure you drink a full glass of water with each dose.  Please make sure you are still doing salt water gargles 2 times a day.  I hope you have a wonderful, happy, safe, and healthy Holiday Season! See you in the New Year :)  Please continue to practice social distancing to keep you, your family, and our community safe.  If you must go out, please wear a mask and practice good handwashing.  It was a pleasure to see you and I look forward to continuing to work together on your health and well-being. Please do not hesitate to call the office if you need care or have questions about your care.  Have a wonderful day and week. With Gratitude, Cherly Beach, DNP, AGNP-BC

## 2019-09-14 NOTE — Progress Notes (Signed)
Subjective:     Patient ID: Norman Peters, male   DOB: 1956/08/05, 63 y.o.   MRN: [id number]  Norman Peters presents for Ear Pain (was left ear, was given antibiotics and that ear got better, now right ear is hurt and goes down into neck.)  Recent infection related to dental carries. He has ear pain with it. Treated with Amoxicillin, was getting better and then after stopping medication got worse again on the Right side, where it was the Left. Denies fevers, chills, jaw/bone pain, headache, or facial pain. Reports some associated sore throat with swallowing, swollen glands. No recent exposure to COVID or contacts that have been sick.   Today patient denies signs and symptoms of COVID 19 infection including fever, chills, cough, shortness of breath, and headache.  Past Medical, Surgical, Social History, Allergies, and Medications have been Reviewed.   Past Medical History:  Diagnosis Date  . Anxiety   . Arthritis   . Depression 2001  . Diabetes mellitus 2010  . GERD (gastroesophageal reflux disease)   . Hyperlipidemia   . Hypertension 2005   Past Surgical History:  Procedure Laterality Date  . COLONOSCOPY N/A 07/07/2016   Procedure: COLONOSCOPY;  Surgeon: Rogene Houston, MD;  Location: [facility];  Service: Endoscopy;  Laterality: N/A;  1030  . FINGER SURGERY Left 1974   fifth , trauma on job, had to replace/repair finger  . HERNIA REPAIR  [id number]   x3, umbilical and bilateral hernia   Social History   Socioeconomic History  . Marital status: Married    Spouse name: Not on file  . Number of children: Not on file  . Years of education: Not on file  . Highest education level: Not on file  Occupational History  . Not on file  Social Needs  . Financial resource strain: Not hard at all  . Food insecurity    Worry: Never true    Inability: Never true  . Transportation needs    Medical: No    Non-medical: No  Tobacco Use  . Smoking status: Former Smoker    Quit date:  01/20/2011    Years since quitting: 8.6  . Smokeless tobacco: Never Used  Substance and Sexual Activity  . Alcohol use: No  . Drug use: No  . Sexual activity: Not Currently  Lifestyle  . Physical activity    Days per week: 3 days    Minutes per session: 40 min  . Stress: Only a little  Relationships  . Social Herbalist on phone: Three times a week    Gets together: Never    Attends religious service: More than 4 times per year    Active member of club or organization: Yes    Attends meetings of clubs or organizations: More than 4 times per year    Relationship status: Married  . Intimate partner violence    Fear of current or ex partner: No    Emotionally abused: No    Physically abused: No    Forced sexual activity: No  Other Topics Concern  . Not on file  Social History Narrative  . Not on file    Outpatient Encounter Medications as of 09/14/2019  Medication Sig  . amLODipine (NORVASC) 10 MG tablet TAKE 1 TABLET EVERY DAY  . amoxicillin (AMOXIL) 500 MG capsule Take 1 capsule (500 mg total) by mouth 3 (three) times daily.  Marland Kitchen aspirin EC 81 MG tablet Take 81 mg by mouth  daily.  . busPIRone (BUSPAR) 5 MG tablet TAKE 1 TABLET TWICE DAILY  . Cholecalciferol (VITAMIN D3) 400 units CAPS Take 2 capsules by mouth daily.  Marland Kitchen FLUoxetine (PROZAC) 10 MG capsule TAKE 1 CAPSULE EVERY DAY (DOSE INCREASE, 30 MG TOTAL)  . FLUoxetine (PROZAC) 20 MG capsule TAKE 1 CAPSULE EVERY DAY  . glipiZIDE (GLUCOTROL) 10 MG tablet TAKE 1 TABLET TWICE DAILY BEFORE A MEAL  . Insulin Glargine (LANTUS) 100 UNIT/ML Solostar Pen Inject 25 Units into the skin daily.  . Insulin Pen Needle 32G X 4 MM MISC Use daily to inject insulin DX e11.65  . metFORMIN (GLUCOPHAGE) 1000 MG tablet TAKE 1 TABLET TWICE DAILY WITH A MEAL  . pravastatin (PRAVACHOL) 10 MG tablet TAKE 1 TABLET EVERY DAY  . spironolactone (ALDACTONE) 50 MG tablet TAKE 1 TABLET EVERY DAY  . tadalafil (CIALIS) 10 MG tablet Take 1 tablet (10  mg total) by mouth daily as needed for erectile dysfunction.  . traMADol (ULTRAM) 50 MG tablet Take one tablet by mouth once daily, as needed, for joint pain  . TRUE METRIX BLOOD GLUCOSE TEST test strip TEST ONE TIME DAILY  . TRUEplus Lancets 28G MISC USE  TO  TEST ONE TIME DAILY  . amoxicillin-clavulanate (AUGMENTIN) 875-125 MG tablet Take 1 tablet by mouth 2 (two) times daily for 7 days.   No facility-administered encounter medications on file as of 09/14/2019.    Allergies  Allergen Reactions  . Ibuprofen Rash  . Lisinopril Cough  . Other Other (See Comments)  . Strawberry [Berry]     Review of Systems  Constitutional: Negative for chills and fever.  HENT: Positive for dental problem.   Eyes: Negative.   Respiratory: Negative.   Cardiovascular: Negative.   Gastrointestinal: Negative.   Endocrine: Negative.   Genitourinary: Negative.   Musculoskeletal: Negative.   Skin: Negative.   Allergic/Immunologic: Negative.   Neurological: Negative.   Hematological: Negative.   Psychiatric/Behavioral: Negative.   All other systems reviewed and are negative.      Objective:     BP 130/84   Pulse 96   Temp 98.9 F (37.2 C) (Oral)   Resp 15   Ht 5\' 8"  (1.727 m)   Wt 215 lb 1.3 oz (97.6 kg)   SpO2 94%   BMI 32.70 kg/m   Physical Exam Vitals signs and nursing note reviewed.  Constitutional:      Appearance: Normal appearance. He is well-developed and well-groomed. He is obese.  HENT:     Head: Normocephalic and atraumatic.     Right Ear: External ear normal. Tympanic membrane is injected.     Left Ear: External ear normal. Tympanic membrane is injected.     Nose: Nose normal.     Mouth/Throat:     Mouth: Mucous membranes are moist.     Dentition: Abnormal dentition. Dental tenderness present.     Pharynx: Oropharynx is clear. Uvula midline. No pharyngeal swelling or oropharyngeal exudate.  Eyes:     General:        Right eye: No discharge.        Left eye: No  discharge.     Conjunctiva/sclera: Conjunctivae normal.  Neck:     Musculoskeletal: Normal range of motion and neck supple.  Cardiovascular:     Rate and Rhythm: Normal rate and regular rhythm.     Pulses: Normal pulses.     Heart sounds: Normal heart sounds.  Pulmonary:     Effort: Pulmonary effort is normal.  Breath sounds: Normal breath sounds.  Musculoskeletal: Normal range of motion.  Lymphadenopathy:     Cervical: Cervical adenopathy present.     Right cervical: Superficial cervical adenopathy present. No deep or posterior cervical adenopathy.    Left cervical: Superficial cervical adenopathy present. No deep or posterior cervical adenopathy.  Skin:    General: Skin is warm.  Neurological:     General: No focal deficit present.     Mental Status: He is alert and oriented to person, place, and time.  Psychiatric:        Attention and Perception: Attention normal.        Mood and Affect: Mood normal.        Speech: Speech normal.        Behavior: Behavior normal. Behavior is cooperative.        Thought Content: Thought content normal.        Cognition and Memory: Cognition normal.        Judgment: Judgment normal.        Assessment and Plan        1. Dental caries Ongoing dental infection/carries. Awaiting to have the teeth pulled, still getting infections frequently. Recent use of Amoxicillin. Will do Augmentin this time and suggested frequent salt water gargles daily. And good oral hygiene until he can be seen at dentist. No S&S of systemic infection at this time.  - amoxicillin-clavulanate (AUGMENTIN) 875-125 MG tablet; Take 1 tablet by mouth 2 (two) times daily for 7 days.  Dispense: 14 tablet; Refill: 0  2. Hypertension goal BP (blood pressure) < 130/80 [place] is encouraged to maintain a well balanced diet that is low in salt. Controlled, continue current medication regimen.  No refills needed.  Additionally, he is also reminded that exercise is  beneficial for heart health and control of  Blood pressure. 30-60 minutes daily is recommended-walking was suggested.   Follow up: 01/09/2020   Perlie Mayo, DNP, AGNP-BC [address] [place], [place] [address] Office Hours: Mon-Thurs 8 am-5 pm; Fri 8 am-12 pm Office Phone:  [phone]  Office Fax: [phone]

## 2019-09-18 ENCOUNTER — Encounter: Payer: Self-pay | Admitting: Nutrition

## 2019-09-18 ENCOUNTER — Encounter: Payer: Medicare HMO | Attending: Family Medicine | Admitting: Nutrition

## 2019-09-18 ENCOUNTER — Other Ambulatory Visit: Payer: Self-pay

## 2019-09-18 VITALS — Ht 68.0 in | Wt 215.0 lb

## 2019-09-18 DIAGNOSIS — E785 Hyperlipidemia, unspecified: Secondary | ICD-10-CM | POA: Diagnosis not present

## 2019-09-18 DIAGNOSIS — E1165 Type 2 diabetes mellitus with hyperglycemia: Secondary | ICD-10-CM

## 2019-09-18 DIAGNOSIS — E663 Overweight: Secondary | ICD-10-CM

## 2019-09-18 DIAGNOSIS — I10 Essential (primary) hypertension: Secondary | ICD-10-CM

## 2019-09-18 NOTE — Progress Notes (Signed)
Diabetes Self-Management Education  Visit Type:  F/u  Appt. Start Time: 130Appt. End Time: 1430  09/18/2019  [name], identified by name and date of birth, is a 63 y.o. male with a diagnosis of Diabetes:    Metformin 1000 mg BID, GLipizide 10 mg BID, 30  units of Lantus daily. A1C down grom 11% down to 8.5%. FBS 106-150's Bedtime: 140-179,  Lab Results  Component Value Date   HGBA1C 8.5 (H) 08/24/2019      ASSESSMENT  Wt Readings from Last 3 Encounters:  09/18/19 215 lb (97.5 kg)  09/14/19 215 lb 1.3 oz (97.6 kg)  09/03/19 215 lb (97.5 kg)   Ht Readings from Last 3 Encounters:  09/18/19 _0  (1.727 m)  09/14/19 _1  (1.727 m)  09/03/19 _2  (1.727 m)   Body mass index is 32.69 kg/m. _3 @ Facility age limit for growth percentiles is 20 years. Facility age limit for growth percentiles is 20 years. Lipid Panel     Component Value Date/Time   CHOL 97 08/24/2019 0923   TRIG 164 (H) 08/24/2019 0923   HDL 32 (L) 08/24/2019 0923   CHOLHDL 3.0 08/24/2019 0923   VLDL 39 (H) 05/14/2017 0831   LDLCALC 41 08/24/2019 0923   CMP Latest Ref Rng & Units 08/24/2019 05/03/2019 01/04/2019  Glucose 65 - 99 mg/dL 100(H) 231(H) 233(H)  BUN 7 - 25 mg/dL _4 Creatinine 0.70 - 1.25 mg/dL 1.35(H) 1.26(H) 1.30(H)  Sodium 135 - 146 mmol/L 137 135 136  Potassium 3.5 - 5.3 mmol/L 4.6 4.7 4.7  Chloride 98 - 110 mmol/L 102 98 99  CO2 20 - 32 mmol/L _5 Calcium 8.6 - 10.3 mg/dL 9.6 9.6 10.2  Total Protein 6.1 - 8.1 g/dL 6.8 - 7.5  Total Bilirubin 0.2 - 1.2 mg/dL 0.7 - 0.4  Alkaline Phos 40 - 115 U/L - - -  AST 10 - 35 U/L 8(L) - 10  ALT 9 - 46 U/L 7(L) - 13    B) cherrios, milk, , banana,  1 slice wheat, L) ramens. D Chicken , string beans, mac/cheese, water He is eating better balanced meals, BS are better.    Individualized Plan for Diabetes Self-Management Training:   Learning Objective:  Patient will have a greater understanding of diabetes  self-management. Patient education plan is to attend individual and/or group sessions per assessed needs and concerns.   Plan:  Goals 1. Cut out sweetts--done well. 2. Increase exercise-  Walking every other day. 3. Test twice a day, in am and before bed Get A1C down to 7% or less.   Expected Outcomes:    Improved self management of his DM.  Education material provided: Living Well with Diabetes, A1C conversion sheet, Meal plan card, My Plate and Carbohydrate counting sheet  If problems or questions, patient to contact team via:  Phone and Email  Future DSME appointment:  3 months. Making excellent progress.

## 2019-09-20 ENCOUNTER — Other Ambulatory Visit: Payer: Self-pay

## 2019-09-20 ENCOUNTER — Telehealth: Payer: Self-pay

## 2019-09-20 MED ORDER — TRUE METRIX BLOOD GLUCOSE TEST VI STRP: ORAL_STRIP | 1 refills | Status: DC

## 2019-09-20 NOTE — Telephone Encounter (Signed)
Test strips sent in #100

## 2019-09-20 NOTE — Telephone Encounter (Signed)
Pt needs 60 test strips called in instead of 50

## 2019-10-10 ENCOUNTER — Other Ambulatory Visit: Payer: Self-pay

## 2019-10-10 MED ORDER — FLUOXETINE HCL 20 MG PO CAPS: 20.0000 mg | ORAL_CAPSULE | Freq: Every day | ORAL | 1 refills | Status: DC

## 2019-10-10 MED ORDER — PRAVASTATIN SODIUM 10 MG PO TABS: 10.0000 mg | ORAL_TABLET | Freq: Every day | ORAL | 1 refills | Status: DC

## 2019-10-10 MED ORDER — GLIPIZIDE 10 MG PO TABS: ORAL_TABLET | ORAL | 1 refills | Status: DC

## 2019-10-15 ENCOUNTER — Encounter: Payer: Self-pay | Admitting: Nutrition

## 2019-10-15 NOTE — Patient Instructions (Signed)
Goals 1. Cut out sweetts--done well. 2. Increase exercise-  Walking every other day. 3. Test twice a day, in am and before bed Get A1C down to 7% or less.

## 2019-11-21 ENCOUNTER — Other Ambulatory Visit: Payer: Self-pay | Admitting: Family Medicine

## 2019-11-21 DIAGNOSIS — I10 Essential (primary) hypertension: Secondary | ICD-10-CM

## 2019-12-26 DIAGNOSIS — H52223 Regular astigmatism, bilateral: Secondary | ICD-10-CM | POA: Diagnosis not present

## 2019-12-31 LAB — HM DIABETES EYE EXAM

## 2020-01-04 DIAGNOSIS — I10 Essential (primary) hypertension: Secondary | ICD-10-CM | POA: Diagnosis not present

## 2020-01-04 DIAGNOSIS — Z794 Long term (current) use of insulin: Secondary | ICD-10-CM | POA: Diagnosis not present

## 2020-01-04 DIAGNOSIS — E1169 Type 2 diabetes mellitus with other specified complication: Secondary | ICD-10-CM | POA: Diagnosis not present

## 2020-01-05 LAB — BASIC METABOLIC PANEL WITH GFR
BUN/Creatinine Ratio: 11 (calc) (ref 6–22)
BUN: 15 mg/dL (ref 7–25)
CO2: 26 mmol/L (ref 20–32)
Calcium: 9.5 mg/dL (ref 8.6–10.3)
Chloride: 97 mmol/L — ABNORMAL LOW (ref 98–110)
Creat: 1.34 mg/dL — ABNORMAL HIGH (ref 0.70–1.25)
GFR, Est African American: 65 mL/min/{1.73_m2} (ref >=60)
GFR, Est Non African American: 56 mL/min/{1.73_m2} — ABNORMAL LOW (ref >=60)
Glucose, Bld: 114 mg/dL — ABNORMAL HIGH (ref 65–99)
Potassium: 4.6 mmol/L (ref 3.5–5.3)
Sodium: 132 mmol/L — ABNORMAL LOW (ref 135–146)

## 2020-01-05 LAB — CBC
HCT: 37.6 % — ABNORMAL LOW (ref 38.5–50.0)
Hemoglobin: 13.1 g/dL — ABNORMAL LOW (ref 13.2–17.1)
MCH: 35.7 pg — ABNORMAL HIGH (ref 27.0–33.0)
MCHC: 34.8 g/dL (ref 32.0–36.0)
MCV: 102.5 fL — ABNORMAL HIGH (ref 80.0–100.0)
MPV: 8.6 fL (ref 7.5–12.5)
Platelets: 314 10*3/uL (ref 140–400)
RBC: 3.67 10*6/uL — ABNORMAL LOW (ref 4.20–5.80)
RDW: 16 % — ABNORMAL HIGH (ref 11.0–15.0)
WBC: 8.6 10*3/uL (ref 3.8–10.8)

## 2020-01-05 LAB — HEMOGLOBIN A1C
Hgb A1c MFr Bld: 8.7 % of total Hgb — ABNORMAL HIGH (ref <5.7)
Mean Plasma Glucose: 203 (calc)
eAG (mmol/L): 11.2 (calc)

## 2020-01-07 ENCOUNTER — Other Ambulatory Visit (HOSPITAL_COMMUNITY)
Admission: RE | Admit: 2020-01-07 | Discharge: 2020-01-07 | Disposition: A | Discharge status: Home / Self Care | Payer: Medicare HMO | Source: Other Acute Inpatient Hospital | Attending: Family Medicine | Admitting: Family Medicine

## 2020-01-07 DIAGNOSIS — E1169 Type 2 diabetes mellitus with other specified complication: Secondary | ICD-10-CM | POA: Insufficient documentation

## 2020-01-08 LAB — MICROALBUMIN, URINE: Microalb, Ur: 188.1 ug/mL — ABNORMAL HIGH

## 2020-01-09 ENCOUNTER — Encounter: Payer: Medicare HMO | Admitting: Family Medicine

## 2020-01-11 ENCOUNTER — Other Ambulatory Visit: Payer: Self-pay

## 2020-01-11 ENCOUNTER — Ambulatory Visit (INDEPENDENT_AMBULATORY_CARE_PROVIDER_SITE_OTHER): Payer: Medicare HMO | Admitting: Family Medicine

## 2020-01-11 ENCOUNTER — Encounter: Payer: Self-pay | Admitting: Family Medicine

## 2020-01-11 VITALS — BP 146/84 | HR 83 | Temp 97.0°F | Resp 15 | Ht 68.0 in | Wt 219.0 lb

## 2020-01-11 DIAGNOSIS — Z7689 Persons encountering health services in other specified circumstances: Secondary | ICD-10-CM | POA: Insufficient documentation

## 2020-01-11 DIAGNOSIS — E785 Hyperlipidemia, unspecified: Secondary | ICD-10-CM | POA: Diagnosis not present

## 2020-01-11 DIAGNOSIS — I10 Essential (primary) hypertension: Secondary | ICD-10-CM | POA: Diagnosis not present

## 2020-01-11 DIAGNOSIS — E1169 Type 2 diabetes mellitus with other specified complication: Secondary | ICD-10-CM | POA: Diagnosis not present

## 2020-01-11 DIAGNOSIS — E119 Type 2 diabetes mellitus without complications: Secondary | ICD-10-CM | POA: Insufficient documentation

## 2020-01-11 DIAGNOSIS — E559 Vitamin D deficiency, unspecified: Secondary | ICD-10-CM

## 2020-01-11 DIAGNOSIS — Z0001 Encounter for general adult medical examination with abnormal findings: Secondary | ICD-10-CM | POA: Diagnosis not present

## 2020-01-11 DIAGNOSIS — Z125 Encounter for screening for malignant neoplasm of prostate: Secondary | ICD-10-CM

## 2020-01-11 DIAGNOSIS — Z794 Long term (current) use of insulin: Secondary | ICD-10-CM | POA: Diagnosis not present

## 2020-01-11 MED ORDER — MULTI-BETIC DIABETES PO TABS: 1.0000 | ORAL_TABLET | Freq: Every day | ORAL | 3 refills | Status: DC

## 2020-01-11 NOTE — Progress Notes (Signed)
Norman Peters is a 64 y.o. male who presents for annual wellness visit and follow-up on chronic medical conditions.  He has the following concerns: none  He denies having any skin issues.  Denies having any falls.  Denies having any changes in memory.  Denies having any incontinence or changes in bowel or bladder.  Reports sleeping okay and eating well.   Immunization History  Administered Date(s) Administered  . Influenza,inj,Quad PF,6+ Mos 07/14/2016, 08/25/2017, 08/30/2018, 07/02/2019  . Pneumococcal Polysaccharide-23 02/10/2016   Last colonoscopy:  Last PSA: 1.4 08/2019 Dentist: 01/10/2020 Ophtho: wears glasses, 01/2020 Exercise: walking   Other doctors caring for patient include:  End of Life Discussion:  Patient does not have a living will and medical power of attorney   The patient denies anorexia, fever, weight changes, headaches,  vision loss, decreased hearing, ear pain, hoarseness, chest pain, palpitations, dizziness, syncope, dyspnea on exertion, cough, swelling, nausea, vomiting, diarrhea, constipation, abdominal pain, melena, hematochezia, indigestion/heartburn, hematuria, incontinence, erectile dysfunction, nocturia, weakened urine stream, dysuria, genital lesions, joint pains, numbness, tingling, weakness, tremor, suspicious skin lesions, depression, anxiety, abnormal bleeding/bruising, or enlarged lymph nodes  PHYSICAL EXAM:  BP (!) 146/84   Pulse 83   Temp (!) 97 F (36.1 C) (Temporal)   Resp 15   Ht 5\' 8"  (1.727 m)   Wt 219 lb 0.6 oz (99.4 kg)   SpO2 97%   BMI 33.30 kg/m   Physical Exam Vitals and nursing note reviewed.  Constitutional:      Appearance: Normal appearance. He is well-developed and well-groomed. He is obese.  HENT:     Head: Normocephalic and atraumatic.     Right Ear: Hearing, tympanic membrane, ear canal and external ear normal.     Left Ear: Hearing, tympanic membrane, ear canal and external ear normal.     Mouth/Throat:     Comments: Mask  in place  Eyes:     General:        Right eye: No discharge.        Left eye: No discharge.     Extraocular Movements: Extraocular movements intact.     Conjunctiva/sclera: Conjunctivae normal.     Pupils: Pupils are equal, round, and reactive to light.  Cardiovascular:     Rate and Rhythm: Normal rate and regular rhythm.     Pulses: Normal pulses.     Heart sounds: Normal heart sounds.  Pulmonary:     Effort: Pulmonary effort is normal.     Breath sounds: Normal breath sounds.  Abdominal:     General: Bowel sounds are normal. There is no distension.     Palpations: Abdomen is soft.  Musculoskeletal:        General: Normal range of motion.     Cervical back: Normal range of motion and neck supple.     Right lower leg: No edema.     Left lower leg: No edema.  Skin:    General: Skin is warm and dry.     Capillary Refill: Capillary refill takes less than 2 seconds.  Neurological:     General: No focal deficit present.     Mental Status: He is alert and oriented to person, place, and time.     Cranial Nerves: Cranial nerves are intact.     Sensory: Sensation is intact.     Motor: Motor function is intact.     Coordination: Coordination is intact.     Gait: Gait is intact.     Deep Tendon Reflexes:  Reflexes are normal and symmetric.  Psychiatric:        Attention and Perception: Attention and perception normal.        Mood and Affect: Mood and affect normal.        Speech: Speech normal.        Behavior: Behavior normal. Behavior is cooperative.        Thought Content: Thought content normal.        Cognition and Memory: Cognition and memory normal.        Judgment: Judgment normal.     Depression screen [facility] 2/9 01/11/2020 01/11/2020 09/14/2019  Decreased Interest 0 0 0  Down, Depressed, Hopeless 1 1 0  PHQ - 2 Score 1 1 0  Altered sleeping 1 - -  Tired, decreased energy 0 - -  Change in appetite 3 - -  Feeling bad or failure about yourself  0 - -  Trouble concentrating 0  - -  Moving slowly or fidgety/restless 0 - -  Suicidal thoughts 0 - -  PHQ-9 Score 5 - -  Difficult doing work/chores - - -  Some recent data might be hidden     ASSESSMENT/PLAN:  1. Annual visit for general adult medical examination with abnormal findings  - CBC with Differential/Platelet - COMPLETE METABOLIC PANEL WITH GFR - Hemoglobin A1c - Lipid panel - PSA - TSH - VITAMIN D 25 Hydroxy (Vit-D Deficiency, Fractures)  2. Type 2 diabetes mellitus with other specified complication, with long-term current use of insulin (HCC)  - CBC with Differential/Platelet - COMPLETE METABOLIC PANEL WITH GFR - Hemoglobin A1c - Ambulatory referral to Endocrinology - Multiple Vitamins-Minerals (MULTI-BETIC DIABETES) TABS; Take 1 tablet by mouth daily.  Dispense: 30 tablet; Refill: 3  3. Vitamin D deficiency  - VITAMIN D 25 Hydroxy (Vit-D Deficiency, Fractures) - Multiple Vitamins-Minerals (MULTI-BETIC DIABETES) TABS; Take 1 tablet by mouth daily.  Dispense: 30 tablet; Refill: 3  4. Encounter for screening for malignant neoplasm of prostate  - PSA  5. Hyperlipidemia LDL goal <100  - Lipid panel  6. Hypertension goal BP (blood pressure) < 130/80      The patient's weight, height, BMI, and visual acuity have been recorded in the chart.  I have made referrals, counseling, and provided education to the patient based on review of the above and I have provided the patient with a written personalized care plan for preventive services.     Perlie Mayo, NP   01/11/2020

## 2020-01-11 NOTE — Patient Instructions (Signed)
I appreciate the opportunity to provide you with care for your health and wellness. Today we discussed: overall health   Follow up: 3 months-need blood count rechecked  Labs 1 week before next appt-placed today Referral to Endo placed today  Continue all medications. Start taking multivitamin daily.   Walk 30 minutes daily as well.  Please continue to practice social distancing to keep you, your family, and our community safe.  If you must go out, please wear a mask and practice good handwashing.  It was a pleasure to see you and I look forward to continuing to work together on your health and well-being. Please do not hesitate to call the office if you need care or have questions about your care.  Have a wonderful day and week. With Gratitude, Cherly Beach, DNP, AGNP-BC   HEALTH MAINTENANCE RECOMMENDATIONS:  It is recommended that you get at least 30 minutes of aerobic exercise at least 5 days/week (for weight loss, you may need as much as 60-90 minutes). This can be any activity that gets your heart rate up. This can be divided in 10-15 minute intervals if needed, but try and build up your endurance at least once a week.  Weight bearing exercise is also recommended twice weekly.  Eat a healthy diet with lots of vegetables, fruits and fiber.  "Colorful" foods have a lot of vitamins (ie [place] vegetables, tomatoes, red peppers, etc).  Limit sweet tea, regular sodas and alcoholic beverages, all of which has a lot of calories and sugar.  Up to 2 alcoholic drinks daily may be beneficial for men (unless trying to lose weight, watch sugars).  Drink a lot of water.  Sunscreen of at least SPF 30 should be used on all sun-exposed parts of the skin when outside between the hours of 10 am and 4 pm (not just when at beach or pool, but even with exercise, golf, tennis, and yard work!)  Use a sunscreen that says "broad spectrum" so it covers both UVA and UVB rays, and make sure to reapply every 1-2  hours.  Remember to change the batteries in your smoke detectors when changing your clock times in the spring and fall.  Use your seat belt every time you are in a car, and please drive safely and not be distracted with cell phones and texting while driving.

## 2020-01-11 NOTE — Assessment & Plan Note (Signed)
Labs ordered.

## 2020-01-11 NOTE — Assessment & Plan Note (Signed)
Norman Peters is encouraged to maintain a well balanced diet that is low in salt. Not Controlled today, reports not taking his meds this morning,  continue current medication regimen at this time.   Additionally, she is also reminded that exercise is beneficial for heart health and control of  Blood pressure. 30-60 minutes daily is recommended-walking was suggested.

## 2020-01-11 NOTE — Assessment & Plan Note (Signed)
Discussed PSA screening (risks/benefits), recommended at least 30 minutes of aerobic activity at least 5 days/week; proper sunscreen use reviewed; healthy diet and alcohol recommendations (less than or equal to 2 drinks/day) reviewed; regular seatbelt use; changing batteries in smoke detectors. Immunization recommendations discussed.  Colonoscopy recommendations reviewed. ° °

## 2020-01-11 NOTE — Assessment & Plan Note (Signed)
Norman Peters is encouraged to check blood sugar daily as directed. Continue current medications. Is on statin as well. Educated on importance of maintain a well balanced diabetic friendly diet.  He is reminded the importance of maintaining  good blood sugars,  taking medications as directed, daily foot care, annual eye exams. Additionally educated about keeping good control over blood pressure and cholesterol as well.

## 2020-01-11 NOTE — Assessment & Plan Note (Signed)
Needs up dated lab

## 2020-01-11 NOTE — Assessment & Plan Note (Signed)
Linked to DM Low fat diet is encouraged Up dated Labs ordered

## 2020-01-23 ENCOUNTER — Other Ambulatory Visit: Payer: Self-pay | Admitting: Family Medicine

## 2020-01-25 ENCOUNTER — Ambulatory Visit: Payer: Medicare HMO | Attending: Internal Medicine

## 2020-01-25 DIAGNOSIS — Z23 Encounter for immunization: Secondary | ICD-10-CM

## 2020-01-25 NOTE — Progress Notes (Signed)
   Covid-[facility] Vaccination [facility]  Name:  Courtlin Cocker    MRN: [id number] DOB: 31-Aug-1956  01/25/2020  Mr. Toyama was observed post Covid-19 immunization for 15 minutes without incident. He was provided with Vaccine Information Sheet and instruction to access the V-Safe system.   Mr. Cammarota was instructed to call 911 with any severe reactions post vaccine: Marland Kitchen Difficulty breathing  . Swelling of face and throat  . A fast heartbeat  . A bad rash all over body  . Dizziness and weakness   Immunizations Administered    Name Date Dose VIS Date Route   Pfizer COVID-19 Vaccine 01/25/2020 10:57 AM 0.3 mL 10/19/2019 Intramuscular   Manufacturer: [place]   Lot: [id number]   [place]: [id number]

## 2020-02-05 ENCOUNTER — Encounter: Payer: Self-pay | Admitting: "Endocrinology

## 2020-02-05 ENCOUNTER — Ambulatory Visit (INDEPENDENT_AMBULATORY_CARE_PROVIDER_SITE_OTHER): Payer: Medicare HMO | Admitting: "Endocrinology

## 2020-02-05 ENCOUNTER — Other Ambulatory Visit: Payer: Self-pay

## 2020-02-05 VITALS — BP 131/81 | HR 80 | Ht 68.0 in | Wt 216.8 lb

## 2020-02-05 DIAGNOSIS — N1831 Chronic kidney disease, stage 3a: Secondary | ICD-10-CM | POA: Diagnosis not present

## 2020-02-05 DIAGNOSIS — Z794 Long term (current) use of insulin: Secondary | ICD-10-CM | POA: Diagnosis not present

## 2020-02-05 DIAGNOSIS — E1121 Type 2 diabetes mellitus with diabetic nephropathy: Secondary | ICD-10-CM

## 2020-02-05 MED ORDER — METFORMIN HCL 500 MG PO TABS: 500.0000 mg | ORAL_TABLET | Freq: Two times a day (BID) | ORAL | 1 refills | Status: DC

## 2020-02-05 MED ORDER — METFORMIN HCL 500 MG PO TABS: 500.0000 mg | ORAL_TABLET | Freq: Two times a day (BID) | ORAL | 0 refills | Status: DC

## 2020-02-05 MED ORDER — INSULIN GLARGINE 100 UNIT/ML SOLOSTAR PEN: 30.0000 [IU] | PEN_INJECTOR | Freq: Every day | SUBCUTANEOUS | 2 refills | Status: DC

## 2020-02-05 MED ORDER — GLIPIZIDE ER 5 MG PO TB24: 5.0000 mg | ORAL_TABLET | Freq: Every day | ORAL | 1 refills | Status: DC

## 2020-02-05 MED ORDER — GLIPIZIDE ER 5 MG PO TB24: 5.0000 mg | ORAL_TABLET | Freq: Every day | ORAL | 0 refills | Status: DC

## 2020-02-05 NOTE — Patient Instructions (Signed)

## 2020-02-05 NOTE — Progress Notes (Signed)
Endocrinology Consult Note       02/05/2020, 9:24 AM   Subjective:    Patient ID: Norman Peters, male    DOB: Sep 27, 1956.  Norman Peters is being seen in consultation for management of currently uncontrolled symptomatic diabetes requested by  Fayrene Helper, MD.   Past Medical History:  Diagnosis Date  . Anxiety   . Arthritis   . Depression 2001  . Diabetes mellitus 2010  . GERD (gastroesophageal reflux disease)   . Hyperlipidemia   . Hypertension 2005    Past Surgical History:  Procedure Laterality Date  . COLONOSCOPY N/A 07/07/2016   Procedure: COLONOSCOPY;  Surgeon: Rogene Houston, MD;  Location: [facility];  Service: Endoscopy;  Laterality: N/A;  1030  . FINGER SURGERY Left 1974   fifth , trauma on job, had to replace/repair finger  . HERNIA REPAIR  [id number]   x3, umbilical and bilateral hernia    Social History   Socioeconomic History  . Marital status: Married    Spouse name: Not on file  . Number of children: Not on file  . Years of education: Not on file  . Highest education level: Not on file  Occupational History  . Not on file  Tobacco Use  . Smoking status: Former Smoker    Quit date: 01/20/2011    Years since quitting: 9.0  . Smokeless tobacco: Never Used  Substance and Sexual Activity  . Alcohol use: No  . Drug use: No  . Sexual activity: Not Currently  Other Topics Concern  . Not on file  Social History Narrative  . Not on file   Social Determinants of Health   Financial Resource Strain: Low Risk   . Difficulty of Paying Living Expenses: Not hard at all  Food Insecurity: No Food Insecurity  . Worried About Charity fundraiser in the Last Year: Never true  . Ran Out of Food in the Last Year: Never true  Transportation Needs: No Transportation Needs  . Lack of Transportation (Medical): No  . Lack of Transportation (Non-Medical): No  Physical Activity:  Insufficiently Active  . Days of Exercise per Week: 3 days  . Minutes of Exercise per Session: 40 min  Stress: No Stress Concern Present  . Feeling of Stress : Only a little  Social Connections: Not Isolated  . Frequency of Communication with Friends and Family: Three times a week  . Frequency of Social Gatherings with Friends and Family: Never  . Attends Religious Services: More than 4 times per year  . Active Member of Clubs or Organizations: Yes  . Attends Archivist Meetings: More than 4 times per year  . Marital Status: Married    Family History  Problem Relation Age of Onset  . Stroke Mother     Outpatient Encounter Medications as of 02/05/2020  Medication Sig  . amLODipine (NORVASC) 10 MG tablet TAKE 1 TABLET EVERY DAY  . aspirin EC 81 MG tablet Take 81 mg by mouth daily.  . busPIRone (BUSPAR) 5 MG tablet TAKE 1 TABLET TWICE DAILY  . Cholecalciferol (VITAMIN D3) 400 units CAPS Take 2 capsules  by mouth daily.  Marland Kitchen FLUoxetine (PROZAC) 10 MG capsule TAKE 1 CAPSULE EVERY DAY (DOSE INCREASE, 30 MG TOTAL)  . FLUoxetine (PROZAC) 20 MG capsule Take 1 capsule (20 mg total) by mouth daily.  Marland Kitchen glipiZIDE (GLUCOTROL XL) 5 MG 24 hr tablet Take 1 tablet (5 mg total) by mouth daily with breakfast.  . insulin glargine (LANTUS) 100 UNIT/ML Solostar Pen Inject 30 Units into the skin at bedtime.  . Insulin Pen Needle 32G X 4 MM MISC Use daily to inject insulin DX e11.65  . metFORMIN (GLUCOPHAGE) 500 MG tablet Take 1 tablet (500 mg total) by mouth 2 (two) times daily with a meal.  . Multiple Vitamins-Minerals (MULTI-BETIC DIABETES) TABS Take 1 tablet by mouth daily.  . pravastatin (PRAVACHOL) 10 MG tablet Take 1 tablet (10 mg total) by mouth daily.  Marland Kitchen spironolactone (ALDACTONE) 50 MG tablet TAKE 1 TABLET EVERY DAY  . tadalafil (CIALIS) 10 MG tablet Take 1 tablet (10 mg total) by mouth daily as needed for erectile dysfunction.  . traMADol (ULTRAM) 50 MG tablet Take one tablet by mouth once  daily, as needed, for joint pain  . TRUE METRIX BLOOD GLUCOSE TEST test strip TEST ONE TIME DAILY  . TRUEplus Lancets 28G MISC USE  TO  TEST ONE TIME DAILY  . [DISCONTINUED] amoxicillin (AMOXIL) 500 MG capsule Take 1 capsule (500 mg total) by mouth 3 (three) times daily.  . [DISCONTINUED] glipiZIDE (GLUCOTROL XL) 5 MG 24 hr tablet Take 1 tablet (5 mg total) by mouth daily with breakfast.  . [DISCONTINUED] glipiZIDE (GLUCOTROL) 10 MG tablet TAKE 1 TABLET TWICE DAILY BEFORE A MEAL  . [DISCONTINUED] Insulin Glargine (LANTUS) 100 UNIT/ML Solostar Pen Inject 25 Units into the skin daily.  . [DISCONTINUED] metFORMIN (GLUCOPHAGE) 1000 MG tablet TAKE 1 TABLET TWICE DAILY WITH A MEAL  . [DISCONTINUED] metFORMIN (GLUCOPHAGE) 500 MG tablet Take 1 tablet (500 mg total) by mouth 2 (two) times daily with a meal.   No facility-administered encounter medications on file as of 02/05/2020.    ALLERGIES: Allergies  Allergen Reactions  . Ibuprofen Rash  . Lisinopril Cough  . Other Other (See Comments)  . Strawberry [Berry]     VACCINATION STATUS: Immunization History  Administered Date(s) Administered  . Influenza,inj,Quad PF,6+ Mos 07/14/2016, 08/25/2017, 08/30/2018, 07/02/2019  . PFIZER SARS-COV-2 Vaccination 01/25/2020  . Pneumococcal Polysaccharide-23 02/10/2016    Diabetes He presents for his initial diabetic visit. He has type 2 diabetes mellitus. Onset time: He was diagnosed at approximate age of 48 years. His disease course has been fluctuating. There are no hypoglycemic associated symptoms. Pertinent negatives for hypoglycemia include no confusion, headaches, pallor or seizures. Associated symptoms include polydipsia and polyuria. Pertinent negatives for diabetes include no chest pain, no fatigue, no polyphagia and no weakness. There are no hypoglycemic complications. Symptoms are worsening. Diabetic complications include nephropathy. Risk factors for coronary artery disease include diabetes  mellitus, dyslipidemia, obesity, hypertension, male sex, tobacco exposure and sedentary lifestyle. Current diabetic treatment includes insulin injections. His weight is fluctuating minimally. He is following a generally unhealthy diet. When asked about meal planning, he reported none. He has had a previous visit with a dietitian. He participates in exercise intermittently. His breakfast blood glucose range is generally 110-130 mg/dl. His bedtime blood glucose range is generally 140-180 mg/dl. His overall blood glucose range is 130-140 mg/dl. An ACE inhibitor/angiotensin II receptor blocker is not being taken. Eye exam is current.  Hyperlipidemia This is a chronic problem. The current episode started  more than 1 year ago. The problem is uncontrolled. Exacerbating diseases include chronic renal disease and diabetes. Pertinent negatives include no chest pain, myalgias or shortness of breath. Current antihyperlipidemic treatment includes statins. Risk factors for coronary artery disease include diabetes mellitus, dyslipidemia, male sex, obesity, hypertension and a sedentary lifestyle.  Hypertension This is a chronic problem. The current episode started more than 1 year ago. The problem is controlled. Pertinent negatives include no chest pain, headaches, neck pain, palpitations or shortness of breath. Risk factors for coronary artery disease include diabetes mellitus, male gender, smoking/tobacco exposure and sedentary lifestyle. Past treatments include calcium channel blockers. Hypertensive end-organ damage includes kidney disease. Identifiable causes of hypertension include chronic renal disease.     Review of Systems  Constitutional: Negative for chills, fatigue, fever and unexpected weight change.  HENT: Negative for dental problem, mouth sores and trouble swallowing.   Eyes: Negative for visual disturbance.  Respiratory: Negative for cough, choking, chest tightness, shortness of breath and wheezing.    Cardiovascular: Negative for chest pain, palpitations and leg swelling.  Gastrointestinal: Negative for abdominal distention, abdominal pain, constipation, diarrhea, nausea and vomiting.  Endocrine: Positive for polydipsia and polyuria. Negative for polyphagia.  Genitourinary: Negative for dysuria, flank pain, hematuria and urgency.  Musculoskeletal: Negative for back pain, gait problem, myalgias and neck pain.  Skin: Negative for pallor, rash and wound.  Neurological: Negative for seizures, syncope, weakness, numbness and headaches.  Psychiatric/Behavioral: Negative for confusion and dysphoric mood.    Objective:    Vitals with BMI 02/05/2020 01/11/2020 09/18/2019  Height 5\' 8"  5\' 8"  5\' 8"   Weight 216 lbs 13 oz 219 lbs 1 oz 215 lbs  BMI 32.97 [id number]  Systolic [id number] -  Diastolic 81 84 -  Pulse 80 83 -    BP 131/81   Pulse 80   Ht 5\' 8"  (1.727 m)   Wt 216 lb 12.8 oz (98.3 kg)   BMI 32.96 kg/m   Wt Readings from Last 3 Encounters:  02/05/20 216 lb 12.8 oz (98.3 kg)  01/11/20 219 lb 0.6 oz (99.4 kg)  09/18/19 215 lb (97.5 kg)     Physical Exam Constitutional:      General: He is not in acute distress.    Appearance: He is well-developed.  HENT:     Head: Normocephalic and atraumatic.  Neck:     Thyroid: No thyromegaly.     Trachea: No tracheal deviation.  Cardiovascular:     Rate and Rhythm: Normal rate.     Pulses:          Dorsalis pedis pulses are 1+ on the right side and 1+ on the left side.       Posterior tibial pulses are 1+ on the right side and 1+ on the left side.     Heart sounds: S1 normal and S2 normal. No murmur. No gallop.   Pulmonary:     Effort: Pulmonary effort is normal. No respiratory distress.     Breath sounds: No wheezing.  Abdominal:     General: There is no distension.     Tenderness: There is no abdominal tenderness. There is no guarding.  Musculoskeletal:     Right shoulder: No swelling or deformity.     Cervical back: Normal range  of motion and neck supple.  Skin:    General: Skin is warm and dry.     Findings: No rash.     Nails: There is no clubbing.  Neurological:  Mental Status: He is alert and oriented to person, place, and time.     Cranial Nerves: No cranial nerve deficit.     Sensory: No sensory deficit.     Gait: Gait normal.     Deep Tendon Reflexes: Reflexes are normal and symmetric.  Psychiatric:        Speech: Speech normal.        Behavior: Behavior normal. Behavior is cooperative.        Thought Content: Thought content normal.        Judgment: Judgment normal.     CMP     Component Value Date/Time   NA 132 (L) 01/04/2020 1000   K 4.6 01/04/2020 1000   CL 97 (L) 01/04/2020 1000   CO2 26 01/04/2020 1000   GLUCOSE 114 (H) 01/04/2020 1000   BUN 15 01/04/2020 1000   CREATININE 1.34 (H) 01/04/2020 1000   CALCIUM 9.5 01/04/2020 1000   PROT 6.8 08/24/2019 0923   ALBUMIN 3.9 02/10/2017 0927   AST 8 (L) 08/24/2019 0923   ALT 7 (L) 08/24/2019 0923   ALKPHOS 72 02/10/2017 0927   BILITOT 0.7 08/24/2019 0923   GFRNONAA 56 (L) 01/04/2020 1000   GFRAA 65 01/04/2020 1000     Diabetic Labs (most recent): Lab Results  Component Value Date   HGBA1C 8.7 (H) 01/04/2020   HGBA1C 8.5 (H) 08/24/2019   HGBA1C 11.3 (H) 05/03/2019     Lipid Panel     Component Value Date/Time   CHOL 97 08/24/2019 0923   TRIG 164 (H) 08/24/2019 0923   HDL 32 (L) 08/24/2019 0923   CHOLHDL 3.0 08/24/2019 0923   VLDL 39 (H) 05/14/2017 0831   LDLCALC 41 08/24/2019 0923      Lab Results  Component Value Date   TSH 1.96 08/24/2019   TSH 1.34 01/31/2018   TSH 1.61 02/10/2016     Assessment & Plan:   1. Type 2 diabetes mellitus with stage 3a chronic kidney disease, with long-term current use of insulin ([place]) - Norman Peters has currently uncontrolled symptomatic type 2 DM since  64 years of age,  with most recent A1c of 8.7 %. Recent labs reviewed. - I had a long discussion with him about the progressive  nature of diabetes and the pathology behind its complications. -his diabetes is complicated by CKD and he remains at a high risk for more acute and chronic complications which include CAD, CVA, CKD, retinopathy, and neuropathy. These are all discussed in detail with him.  - I have counseled him on diet  and weight management  by adopting a carbohydrate restricted/protein rich diet. Patient is encouraged to switch to  unprocessed or minimally processed     complex starch and increased protein intake (animal or plant source), fruits, and vegetables. -  he is advised to stick to a routine mealtimes to eat 3 meals  a day and avoid unnecessary snacks ( to snack only to correct hypoglycemia).   - he admits that there is a room for improvement in his food and drink choices. - Suggestion is made for him to avoid simple carbohydrates  from his diet including Cakes, Sweet Desserts, Ice Cream, Soda (diet and regular), Sweet Tea, Candies, Chips, Cookies, Store Bought Juices, Alcohol in Excess of  1-2 drinks a day, Artificial Sweeteners,  Coffee Creamer, and "Sugar-free" Products. This will help patient to have more stable blood glucose profile and potentially avoid unintended weight gain.  - he will be scheduled with Kieth Brightly  Crumpton, RDN, CDE for diabetes education.  - I have approached him with the following individualized plan to manage  his diabetes and patient agrees:   - he will continue to benefit from basal insulin.  I advised him to continue Lantus 30 units nightly, associated with monitoring of blood glucose twice a day-daily before breakfast and at bedtime.   - he is warned not to take insulin without proper monitoring per orders.  - he is encouraged to call clinic for blood glucose levels less than 70 or above 200 mg /dl. - he is advised to lower his dose of Metformin to 500 mg p.o. twice daily due to CKD. -His glipizide also will be lowered to 5 mg XL p.o. daily at breakfast.   - he is not a  candidate for SGLT2 inhibitors due to concurrent renal insufficiency.  - he will be considered for incretin therapy as appropriate next visit.  - Specific targets for  A1c;  LDL, HDL,  and Triglycerides were discussed with the patient.  2) Blood Pressure /Hypertension:  his blood pressure is  controlled to target.   he is advised to continue his current medications including amlodipine 10 mg p.o. daily with breakfast . 3) Lipids/Hyperlipidemia:   Review of his recent lipid panel showed  controlled  LDL at 41 .  he  is advised to continue    pravastatin 10 mg daily at bedtime.  Side effects and precautions discussed with him.  4)  Weight/Diet:  Body mass index is 32.96 kg/m.  -   clearly complicating his diabetes care.   he is  a candidate for weight loss. I discussed with him the fact that loss of 5 - 10% of his  current body weight will have the most impact on his diabetes management.  Exercise, and detailed carbohydrates information provided  -  detailed on discharge instructions.  5) Chronic Care/Health Maintenance:  -he  is on a Statin medications and  is encouraged to initiate and continue to follow up with Ophthalmology, Dentist,  Podiatrist at least yearly or according to recommendations, and advised to   stay away from smoking. I have recommended yearly flu vaccine and pneumonia vaccine at least every 5 years; moderate intensity exercise for up to 150 minutes weekly; and  sleep for at least 7 hours a day.  - he is  advised to maintain close follow up with Fayrene Helper, MD for primary care needs, as well as his other providers for optimal and coordinated care.   - Time spent in this patient care: 60 min, of which > 50% was spent in  counseling  him about his currently complicated type 2 diabetes, hyperlipidemia, hypertension and the rest reviewing his blood glucose logs , discussing his hypoglycemia and hyperglycemia episodes, reviewing his current and  previous labs / studies  (  including abstraction from other facilities) and medications  doses and developing a  long term treatment plan based on the latest standards of care/ guidelines; and documenting his care.    Please refer to Patient Instructions for Blood Glucose Monitoring and Insulin/Medications Dosing Guide"  in media tab for additional information. Please  also refer to " Patient Self Inventory" in the Media  tab for reviewed elements of pertinent patient history.  Norman Peters participated in the discussions, expressed understanding, and voiced agreement with the above plans.  All questions were answered to his satisfaction. he is encouraged to contact clinic should he have any questions or concerns prior  to his return visit.   Follow up plan: - Return in about 9 weeks (around 04/08/2020) for Bring Meter and Logs- A1c in Office.  Glade Lloyd, MD [facility] [address] [place], [place] [address] Phone: [phone]  Fax: [phone]    02/05/2020, 9:24 AM  This note was partially dictated with voice recognition software. Similar sounding words can be transcribed inadequately or may not  be corrected upon review.

## 2020-02-18 ENCOUNTER — Ambulatory Visit: Payer: Medicare HMO | Admitting: Nutrition

## 2020-02-19 ENCOUNTER — Ambulatory Visit: Payer: Medicare HMO | Attending: Internal Medicine

## 2020-02-19 DIAGNOSIS — Z23 Encounter for immunization: Secondary | ICD-10-CM

## 2020-02-19 NOTE — Progress Notes (Signed)
   Covid-19 Vaccination Clinic  Name:  Gunnison Hersh    MRN: [id number] DOB: November 10, 1955  02/19/2020  Mr. Mcmahen was observed post Covid-19 immunization for 15 minutes without incident. He was provided with Vaccine Information Sheet and instruction to access the V-Safe system.   Mr. Berber was instructed to call 911 with any severe reactions post vaccine: Marland Kitchen Difficulty breathing  . Swelling of face and throat  . A fast heartbeat  . A bad rash all over body  . Dizziness and weakness   Immunizations Administered    Name Date Dose VIS Date Route   Pfizer COVID-19 Vaccine 02/19/2020 11:03 AM 0.3 mL 10/19/2019 Intramuscular   Manufacturer: [place]   Lot: [id number]   [place]: [id number]

## 2020-02-20 ENCOUNTER — Other Ambulatory Visit: Payer: Self-pay | Admitting: Family Medicine

## 2020-03-28 ENCOUNTER — Encounter: Payer: Medicare HMO | Admitting: Family Medicine

## 2020-04-01 ENCOUNTER — Other Ambulatory Visit: Payer: Self-pay

## 2020-04-01 ENCOUNTER — Telehealth (INDEPENDENT_AMBULATORY_CARE_PROVIDER_SITE_OTHER): Payer: Medicare HMO

## 2020-04-01 VITALS — BP 131/81 | Ht 68.0 in | Wt 216.0 lb

## 2020-04-01 DIAGNOSIS — Z0000 Encounter for general adult medical examination without abnormal findings: Secondary | ICD-10-CM

## 2020-04-01 NOTE — Progress Notes (Addendum)
Subjective:   Norman Peters is a 64 y.o. male who presents for Medicare Annual/Subsequent preventive examination.  Visit was conducted by phone with caregility in the office. Patient was at home. Patient verified identity with two identifiers    Review of Systems:   Cardiac Risk Factors include: advanced age (>59men, >65 women);diabetes mellitus;dyslipidemia;hypertension;male gender;obesity (BMI >30kg/m2);sedentary lifestyle     Objective:    Vitals: BP 131/81   Ht 5\' 8"  (1.727 m)   Wt 216 lb (98 kg)   BMI 32.84 kg/m   Body mass index is 32.84 kg/m.  Advanced Directives 07/07/2017 05/25/2017 07/07/2016  Does Patient Have a Medical Advance Directive? No No No  Would patient like information on creating a medical advance directive? - Yes (Inpatient - patient defers creating a medical advance directive at this time) No - patient declined information    Tobacco Social History   Tobacco Use  Smoking Status Former Smoker  . Packs/day: 0.50  . Years: 30.00  . Pack years: 15.00  . Types: Cigarettes  . Quit date: 01/20/2011  . Years since quitting: 9.2  Smokeless Tobacco Never Used     Counseling given: Not Answered   Clinical Intake:     Pain : No/denies pain     Nutritional Status: BMI > 30  Obese Diabetes: Yes CBG done?: No Did pt. bring in CBG monitor from home?: No  How often do you need to have someone help you when you read instructions, pamphlets, or other written materials from your doctor or pharmacy?: 1 - Never  Interpreter Needed?: No  Information entered by :: Santonio Speakman lpn  Past Medical History:  Diagnosis Date  . Anxiety   . Arthritis   . Depression 2001  . Diabetes mellitus 2010  . GERD (gastroesophageal reflux disease)   . Hyperlipidemia   . Hypertension 2005   Past Surgical History:  Procedure Laterality Date  . COLONOSCOPY N/A 07/07/2016   Procedure: COLONOSCOPY;  Surgeon: Rogene Houston, MD;  Location: [facility];  Service:  Endoscopy;  Laterality: N/A;  1030  . FINGER SURGERY Left 1974   fifth , trauma on job, had to replace/repair finger  . HERNIA REPAIR  [id number]   x3, umbilical and bilateral hernia   Family History  Problem Relation Age of Onset  . Stroke Mother    Social History   Socioeconomic History  . Marital status: Married    Spouse name: Not on file  . Number of children: Not on file  . Years of education: Not on file  . Highest education level: Not on file  Occupational History  . Not on file  Tobacco Use  . Smoking status: Former Smoker    Packs/day: 0.50    Years: 30.00    Pack years: 15.00    Types: Cigarettes    Quit date: 01/20/2011    Years since quitting: 9.2  . Smokeless tobacco: Never Used  Substance and Sexual Activity  . Alcohol use: No  . Drug use: No  . Sexual activity: Not Currently  Other Topics Concern  . Not on file  Social History Narrative  . Not on file   Social Determinants of Health   Financial Resource Strain: Low Risk   . Difficulty of Paying Living Expenses: Not very hard  Food Insecurity: No Food Insecurity  . Worried About Charity fundraiser in the Last Year: Never true  . Ran Out of Food in the Last Year: Never true  Transportation  Needs: No Transportation Needs  . Lack of Transportation (Medical): No  . Lack of Transportation (Non-Medical): No  Physical Activity: Insufficiently Active  . Days of Exercise per Week: 1 day  . Minutes of Exercise per Session: 30 min  Stress: No Stress Concern Present  . Feeling of Stress : Only a little  Social Connections: Slightly Isolated  . Frequency of Communication with Friends and Family: Three times a week  . Frequency of Social Gatherings with Friends and Family: Three times a week  . Attends Religious Services: 1 to 4 times per year  . Active Member of Clubs or Organizations: No  . Attends Archivist Meetings: Never  . Marital Status: Married    Outpatient Encounter Medications as of  04/01/2020  Medication Sig  . amLODipine (NORVASC) 10 MG tablet TAKE 1 TABLET EVERY DAY  . aspirin EC 81 MG tablet Take 81 mg by mouth daily.  . busPIRone (BUSPAR) 5 MG tablet TAKE 1 TABLET TWICE DAILY  . Cholecalciferol (VITAMIN D3) 400 units CAPS Take 2 capsules by mouth daily.  Marland Kitchen FLUoxetine (PROZAC) 10 MG capsule TAKE 1 CAPSULE EVERY DAY (DOSE INCREASE, 30 MG TOTAL)  . FLUoxetine (PROZAC) 20 MG capsule Take 1 capsule (20 mg total) by mouth daily.  Marland Kitchen glipiZIDE (GLUCOTROL XL) 5 MG 24 hr tablet Take 1 tablet (5 mg total) by mouth daily with breakfast.  . insulin glargine (LANTUS) 100 UNIT/ML Solostar Pen Inject 30 Units into the skin at bedtime.  . Insulin Pen Needle 32G X 4 MM MISC Use daily to inject insulin DX e11.65  . metFORMIN (GLUCOPHAGE) 500 MG tablet Take 1 tablet (500 mg total) by mouth 2 (two) times daily with a meal.  . Multiple Vitamins-Minerals (MULTI-BETIC DIABETES) TABS Take 1 tablet by mouth daily.  . pravastatin (PRAVACHOL) 10 MG tablet TAKE 1 TABLET EVERY DAY  . spironolactone (ALDACTONE) 50 MG tablet TAKE 1 TABLET EVERY DAY  . tadalafil (CIALIS) 10 MG tablet Take 1 tablet (10 mg total) by mouth daily as needed for erectile dysfunction.  . traMADol (ULTRAM) 50 MG tablet Take one tablet by mouth once daily, as needed, for joint pain  . TRUE METRIX BLOOD GLUCOSE TEST test strip TEST ONE TIME DAILY  . TRUEplus Lancets 28G MISC USE  TO  TEST ONE TIME DAILY   No facility-administered encounter medications on file as of 04/01/2020.    Activities of Daily Living In your present state of health, do you have any difficulty performing the following activities: 04/01/2020  Hearing? N  Vision? N  Difficulty concentrating or making decisions? N  Walking or climbing stairs? Y  Dressing or bathing? N  Doing errands, shopping? N  Preparing Food and eating ? N  Using the Toilet? N  In the past six months, have you accidently leaked urine? N  Do you have problems with loss of bowel  control? N  Managing your Medications? N  Managing your Finances? N  Housekeeping or managing your Housekeeping? N  Some recent data might be hidden    Patient Care Team: Fayrene Helper, MD as PCP - General (Family Medicine) Satira Sark, MD as PCP - Cardiology (Cardiology)   Assessment:   This is a routine wellness examination for Norman Peters.  Exercise Activities and Dietary recommendations Current Exercise Habits: The patient does not participate in regular exercise at present, Exercise limited by: orthopedic condition(s)  Goals    . Increase physical activity     Try walking 3  days a week for 10-15 mins as tolerated     . LIFESTYLE - DECREASE FALLS RISK       Fall Risk Fall Risk  04/01/2020 01/11/2020 09/14/2019 07/05/2019 06/18/2019  Falls in the past year? 1 1 1 1  0  Number falls in past yr: 0 0 1 1 0  Injury with Fall? 0 1 0 0 0   Is the patient's home free of loose throw rugs in walkways, pet beds, electrical cords, etc?   yes      Grab bars in the bathroom? yes      Handrails on the stairs?   yes      Adequate lighting?   yes  Timed Get Up and Go Performed: unable due to virtual visit   Depression Screen PHQ 2/9 Scores 04/01/2020 01/11/2020 01/11/2020 09/14/2019  PHQ - 2 Score 0 1 1 0  PHQ- 9 Score - 5 - -    Cognitive Function     6CIT Screen 04/01/2020 03/28/2019  What Year? 0 points 0 points  What month? 0 points 0 points  What time? 0 points 0 points  Count back from 20 0 points 0 points  Months in reverse 4 points 0 points  Repeat phrase 0 points 0 points  Total Score 4 0    Immunization History  Administered Date(s) Administered  . Influenza,inj,Quad PF,6+ Mos 07/14/2016, 08/25/2017, 08/30/2018, 07/02/2019  . PFIZER SARS-COV-2 Vaccination 01/25/2020, 02/19/2020  . Pneumococcal Polysaccharide-23 02/10/2016    Qualifies for Shingles Vaccine? Can check with pharmacy due to insurance  Screening Tests Health Maintenance  Topic Date Due  . FOOT EXAM   06/04/2020  . INFLUENZA VACCINE  06/08/2020  . HEMOGLOBIN A1C  07/03/2020  . OPHTHALMOLOGY EXAM  12/30/2020  . URINE MICROALBUMIN  01/06/2021  . TETANUS/TDAP  01/24/2024  . COLONOSCOPY  07/07/2026  . PNEUMOCOCCAL POLYSACCHARIDE VACCINE AGE 83-64 HIGH RISK  Completed  . COVID-19 Vaccine  Completed  . Hepatitis C Screening  Completed  . HIV Screening  Completed   Cancer Screenings: Lung: Low Dose CT Chest recommended if Age 26-80 years, 30 pack-year currently smoking OR have quit w/in 15years. Patient does not qualify. Colorectal: up to date   Additional Screenings:  Hepatitis C Screening:  Screening once done in 2017      Plan:     I have personally reviewed and noted the following in the patient's chart:   . Medical and social history . Use of alcohol, tobacco or illicit drugs  . Current medications and supplements . Functional ability and status . Nutritional status . Physical activity . Advanced directives . List of other physicians . Hospitalizations, surgeries, and ER visits in previous 12 months . Vitals . Screenings to include cognitive, depression, and falls . Referrals and appointments  In addition, I have reviewed and discussed with patient certain preventive protocols, quality metrics, and best practice recommendations. A written personalized care plan for preventive services as well as general preventive health recommendations were provided to patient.     I provided 20 minutes of non-face-to-face time during this encounter.    Kate Sable, LPN, LPN  [id number]

## 2020-04-01 NOTE — Patient Instructions (Signed)
[name] , Thank you for taking time to come for your Medicare Wellness Visit. I appreciate your ongoing commitment to your health goals. Please review the following plan we discussed and let me know if I can assist you in the future.   Screening recommendations/referrals: Colonoscopy: up to date Recommended yearly ophthalmology/optometry visit for glaucoma screening and checkup Recommended yearly dental visit for hygiene and checkup  Vaccinations: Influenza vaccine: up to date  Pneumococcal vaccine: up to date  Tdap vaccine:  Shingles vaccine: Can check with pharmacy    Next appointment: wellness in 1 year   Preventive Care 40-64 Years, Male Preventive care refers to lifestyle choices and visits with your health care provider that can promote health and wellness. What does preventive care include?  A yearly physical exam. This is also called an annual well check.  Dental exams once or twice a year.  Routine eye exams. Ask your health care provider how often you should have your eyes checked.  Personal lifestyle choices, including:  Daily care of your teeth and gums.  Regular physical activity.  Eating a healthy diet.  Avoiding tobacco and drug use.  Limiting alcohol use.  Practicing safe sex.  Taking low-dose aspirin every day starting at age 4. What happens during an annual well check? The services and screenings done by your health care provider during your annual well check will depend on your age, overall health, lifestyle risk factors, and family history of disease. Counseling  Your health care provider may ask you questions about your:  Alcohol use.  Tobacco use.  Drug use.  Emotional well-being.  Home and relationship well-being.  Sexual activity.  Eating habits.  Work and work Statistician. Screening  You may have the following tests or measurements:  Height, weight, and BMI.  Blood pressure.  Lipid and cholesterol levels. These may be  checked every 5 years, or more frequently if you are over 54 years old.  Skin check.  Lung cancer screening. You may have this screening every year starting at age 59 if you have a 30-pack-year history of smoking and currently smoke or have quit within the past 15 years.  Fecal occult blood test (FOBT) of the stool. You may have this test every year starting at age 87.  Flexible sigmoidoscopy or colonoscopy. You may have a sigmoidoscopy every 5 years or a colonoscopy every 10 years starting at age 64.  Prostate cancer screening. Recommendations will vary depending on your family history and other risks.  Hepatitis C blood test.  Hepatitis B blood test.  Sexually transmitted disease (STD) testing.  Diabetes screening. This is done by checking your blood sugar (glucose) after you have not eaten for a while (fasting). You may have this done every 1-3 years. Discuss your test results, treatment options, and if necessary, the need for more tests with your health care provider. Vaccines  Your health care provider may recommend certain vaccines, such as:  Influenza vaccine. This is recommended every year.  Tetanus, diphtheria, and acellular pertussis (Tdap, Td) vaccine. You may need a Td booster every 10 years.  Zoster vaccine. You may need this after age 41.  Pneumococcal 13-valent conjugate (PCV13) vaccine. You may need this if you have certain conditions and have not been vaccinated.  Pneumococcal polysaccharide (PPSV23) vaccine. You may need one or two doses if you smoke cigarettes or if you have certain conditions. Talk to your health care provider about which screenings and vaccines you need and how often you need  them. This information is not intended to replace advice given to you by your health care provider. Make sure you discuss any questions you have with your health care provider. Document Released: 11/21/2015 Document Revised: 07/14/2016 Document Reviewed:  08/26/2015 Elsevier Interactive Patient Education  2017 [place] Prevention in the Home Falls can cause injuries. They can happen to people of all ages. There are many things you can do to make your home safe and to help prevent falls. What can I do on the outside of my home?  Regularly fix the edges of walkways and driveways and fix any cracks.  Remove anything that might make you trip as you walk through a door, such as a raised step or threshold.  Trim any bushes or trees on the path to your home.  Use bright outdoor lighting.  Clear any walking paths of anything that might make someone trip, such as rocks or tools.  Regularly check to see if handrails are loose or broken. Make sure that both sides of any steps have handrails.  Any raised decks and porches should have guardrails on the edges.  Have any leaves, snow, or ice cleared regularly.  Use sand or salt on walking paths during winter.  Clean up any spills in your garage right away. This includes oil or grease spills. What can I do in the bathroom?  Use night lights.  Install grab bars by the toilet and in the tub and shower. Do not use towel bars as grab bars.  Use non-skid mats or decals in the tub or shower.  If you need to sit down in the shower, use a plastic, non-slip stool.  Keep the floor dry. Clean up any water that spills on the floor as soon as it happens.  Remove soap buildup in the tub or shower regularly.  Attach bath mats securely with double-sided non-slip rug tape.  Do not have throw rugs and other things on the floor that can make you trip. What can I do in the bedroom?  Use night lights.  Make sure that you have a light by your bed that is easy to reach.  Do not use any sheets or blankets that are too big for your bed. They should not hang down onto the floor.  Have a firm chair that has side arms. You can use this for support while you get dressed.  Do not have throw rugs  and other things on the floor that can make you trip. What can I do in the kitchen?  Clean up any spills right away.  Avoid walking on wet floors.  Keep items that you use a lot in easy-to-reach places.  If you need to reach something above you, use a strong step stool that has a grab bar.  Keep electrical cords out of the way.  Do not use floor polish or wax that makes floors slippery. If you must use wax, use non-skid floor wax.  Do not have throw rugs and other things on the floor that can make you trip. What can I do with my stairs?  Do not leave any items on the stairs.  Make sure that there are handrails on both sides of the stairs and use them. Fix handrails that are broken or loose. Make sure that handrails are as long as the stairways.  Check any carpeting to make sure that it is firmly attached to the stairs. Fix any carpet that is loose or worn.  Avoid  having throw rugs at the top or bottom of the stairs. If you do have throw rugs, attach them to the floor with carpet tape.  Make sure that you have a light switch at the top of the stairs and the bottom of the stairs. If you do not have them, ask someone to add them for you. What else can I do to help prevent falls?  Wear shoes that:  Do not have high heels.  Have rubber bottoms.  Are comfortable and fit you well.  Are closed at the toe. Do not wear sandals.  If you use a stepladder:  Make sure that it is fully opened. Do not climb a closed stepladder.  Make sure that both sides of the stepladder are locked into place.  Ask someone to hold it for you, if possible.  Clearly mark and make sure that you can see:  Any grab bars or handrails.  First and last steps.  Where the edge of each step is.  Use tools that help you move around (mobility aids) if they are needed. These include:  Canes.  Walkers.  Scooters.  Crutches.  Turn on the lights when you go into a dark area. Replace any light bulbs  as soon as they burn out.  Set up your furniture so you have a clear path. Avoid moving your furniture around.  If any of your floors are uneven, fix them.  If there are any pets around you, be aware of where they are.  Review your medicines with your doctor. Some medicines can make you feel dizzy. This can increase your chance of falling. Ask your doctor what other things that you can do to help prevent falls. This information is not intended to replace advice given to you by your health care provider. Make sure you discuss any questions you have with your health care provider. Document Released: 08/21/2009 Document Revised: 04/01/2016 Document Reviewed: 11/29/2014 Elsevier Interactive Patient Education  2017 [employer].

## 2020-04-08 ENCOUNTER — Other Ambulatory Visit: Payer: Self-pay

## 2020-04-08 ENCOUNTER — Ambulatory Visit (INDEPENDENT_AMBULATORY_CARE_PROVIDER_SITE_OTHER): Payer: Medicare HMO | Admitting: "Endocrinology

## 2020-04-08 ENCOUNTER — Encounter: Payer: Self-pay | Admitting: "Endocrinology

## 2020-04-08 VITALS — BP 134/84 | HR 90 | Ht 68.0 in | Wt 218.0 lb

## 2020-04-08 DIAGNOSIS — Z794 Long term (current) use of insulin: Secondary | ICD-10-CM

## 2020-04-08 DIAGNOSIS — E782 Mixed hyperlipidemia: Secondary | ICD-10-CM

## 2020-04-08 DIAGNOSIS — N1831 Chronic kidney disease, stage 3a: Secondary | ICD-10-CM | POA: Diagnosis not present

## 2020-04-08 DIAGNOSIS — E1121 Type 2 diabetes mellitus with diabetic nephropathy: Secondary | ICD-10-CM

## 2020-04-08 DIAGNOSIS — I10 Essential (primary) hypertension: Secondary | ICD-10-CM

## 2020-04-08 LAB — POCT GLYCOSYLATED HEMOGLOBIN (HGB A1C): Hemoglobin A1C: 9 % — AB (ref 4.0–5.6)

## 2020-04-08 MED ORDER — INSULIN GLARGINE 100 UNIT/ML SOLOSTAR PEN: 40.0000 [IU] | PEN_INJECTOR | Freq: Every day | SUBCUTANEOUS | 2 refills | Status: DC

## 2020-04-08 NOTE — Patient Instructions (Signed)

## 2020-04-08 NOTE — Progress Notes (Signed)
Endocrinology follow-up note    04/08/2020, 11:17 AM   Subjective:    Patient ID: Norman Peters, male    DOB: June 23, 1956.  Norman Peters is being seen in follow-up after he was seen in consultation for management of currently uncontrolled symptomatic diabetes requested by  Fayrene Helper, MD.   Past Medical History:  Diagnosis Date  . Anxiety   . Arthritis   . Depression 2001  . Diabetes mellitus 2010  . GERD (gastroesophageal reflux disease)   . Hyperlipidemia   . Hypertension 2005    Past Surgical History:  Procedure Laterality Date  . COLONOSCOPY N/A 07/07/2016   Procedure: COLONOSCOPY;  Surgeon: Rogene Houston, MD;  Location: [facility];  Service: Endoscopy;  Laterality: N/A;  1030  . FINGER SURGERY Left 1974   fifth , trauma on job, had to replace/repair finger  . HERNIA REPAIR  [id number]   x3, umbilical and bilateral hernia    Social History   Socioeconomic History  . Marital status: Married    Spouse name: Not on file  . Number of children: Not on file  . Years of education: Not on file  . Highest education level: Not on file  Occupational History  . Not on file  Tobacco Use  . Smoking status: Former Smoker    Packs/day: 0.50    Years: 30.00    Pack years: 15.00    Types: Cigarettes    Quit date: 01/20/2011    Years since quitting: 9.2  . Smokeless tobacco: Never Used  Substance and Sexual Activity  . Alcohol use: No  . Drug use: No  . Sexual activity: Not Currently  Other Topics Concern  . Not on file  Social History Narrative  . Not on file   Social Determinants of Health   Financial Resource Strain: Low Risk   . Difficulty of Paying Living Expenses: Not very hard  Food Insecurity: No Food Insecurity  . Worried About Charity fundraiser in the Last Year: Never true  . Ran Out of Food in the Last Year: Never true  Transportation Needs: No Transportation Needs   . Lack of Transportation (Medical): No  . Lack of Transportation (Non-Medical): No  Physical Activity: Insufficiently Active  . Days of Exercise per Week: 1 day  . Minutes of Exercise per Session: 30 min  Stress: No Stress Concern Present  . Feeling of Stress : Only a little  Social Connections: Slightly Isolated  . Frequency of Communication with Friends and Family: Three times a week  . Frequency of Social Gatherings with Friends and Family: Three times a week  . Attends Religious Services: 1 to 4 times per year  . Active Member of Clubs or Organizations: No  . Attends Archivist Meetings: Never  . Marital Status: Married    Family History  Problem Relation Age of Onset  . Stroke Mother     Outpatient Encounter Medications as of 04/08/2020  Medication Sig  . amLODipine (NORVASC) 10 MG tablet TAKE 1 TABLET EVERY DAY  . aspirin EC 81 MG tablet Take 81 mg by mouth daily.  Marland Kitchen  busPIRone (BUSPAR) 5 MG tablet TAKE 1 TABLET TWICE DAILY  . Cholecalciferol (VITAMIN D3) 400 units CAPS Take 2 capsules by mouth daily.  Marland Kitchen FLUoxetine (PROZAC) 10 MG capsule TAKE 1 CAPSULE EVERY DAY (DOSE INCREASE, 30 MG TOTAL)  . FLUoxetine (PROZAC) 20 MG capsule Take 1 capsule (20 mg total) by mouth daily.  . insulin glargine (LANTUS) 100 UNIT/ML Solostar Pen Inject 40 Units into the skin at bedtime.  . Insulin Pen Needle 32G X 4 MM MISC Use daily to inject insulin DX e11.65  . metFORMIN (GLUCOPHAGE) 500 MG tablet Take 1 tablet (500 mg total) by mouth 2 (two) times daily with a meal.  . Multiple Vitamins-Minerals (MULTI-BETIC DIABETES) TABS Take 1 tablet by mouth daily.  . pravastatin (PRAVACHOL) 10 MG tablet TAKE 1 TABLET EVERY DAY  . spironolactone (ALDACTONE) 50 MG tablet TAKE 1 TABLET EVERY DAY  . tadalafil (CIALIS) 10 MG tablet Take 1 tablet (10 mg total) by mouth daily as needed for erectile dysfunction.  . traMADol (ULTRAM) 50 MG tablet Take one tablet by mouth once daily, as needed, for joint  pain  . TRUE METRIX BLOOD GLUCOSE TEST test strip TEST ONE TIME DAILY  . TRUEplus Lancets 28G MISC USE  TO  TEST ONE TIME DAILY  . [DISCONTINUED] glipiZIDE (GLUCOTROL XL) 5 MG 24 hr tablet Take 1 tablet (5 mg total) by mouth daily with breakfast.  . [DISCONTINUED] insulin glargine (LANTUS) 100 UNIT/ML Solostar Pen Inject 30 Units into the skin at bedtime.   No facility-administered encounter medications on file as of 04/08/2020.    ALLERGIES: Allergies  Allergen Reactions  . Ibuprofen Rash  . Lisinopril Cough  . Other Other (See Comments)  . Strawberry [Berry]     VACCINATION STATUS: Immunization History  Administered Date(s) Administered  . Influenza,inj,Quad PF,6+ Mos 07/14/2016, 08/25/2017, 08/30/2018, 07/02/2019  . PFIZER SARS-COV-2 Vaccination 01/25/2020, 02/19/2020  . Pneumococcal Polysaccharide-23 02/10/2016    Diabetes He presents for his follow-up diabetic visit. He has type 2 diabetes mellitus. Onset time: He was diagnosed at approximate age of 77 years. His disease course has been worsening. There are no hypoglycemic associated symptoms. Pertinent negatives for hypoglycemia include no confusion, headaches, pallor or seizures. Associated symptoms include polydipsia and polyuria. Pertinent negatives for diabetes include no chest pain, no fatigue, no polyphagia and no weakness. There are no hypoglycemic complications. Symptoms are worsening. Diabetic complications include nephropathy. Risk factors for coronary artery disease include diabetes mellitus, dyslipidemia, obesity, hypertension, male sex, tobacco exposure and sedentary lifestyle. Current diabetic treatment includes insulin injections. His weight is fluctuating minimally. He is following a generally unhealthy diet. When asked about meal planning, he reported none. He has had a previous visit with a dietitian. He participates in exercise intermittently. His home blood glucose trend is increasing steadily. His breakfast blood  glucose range is generally 180-200 mg/dl. His bedtime blood glucose range is generally >200 mg/dl. His overall blood glucose range is 180-200 mg/dl. (He presents with above target glycemic profile, point-of-care A1c 9%.) An ACE inhibitor/angiotensin II receptor blocker is not being taken. Eye exam is current.  Hyperlipidemia This is a chronic problem. The current episode started more than 1 year ago. The problem is uncontrolled. Exacerbating diseases include chronic renal disease and diabetes. Pertinent negatives include no chest pain, myalgias or shortness of breath. Current antihyperlipidemic treatment includes statins. Risk factors for coronary artery disease include diabetes mellitus, dyslipidemia, male sex, obesity, hypertension and a sedentary lifestyle.  Hypertension This is a chronic problem. The  current episode started more than 1 year ago. The problem is controlled. Pertinent negatives include no chest pain, headaches, neck pain, palpitations or shortness of breath. Risk factors for coronary artery disease include diabetes mellitus, male gender, smoking/tobacco exposure and sedentary lifestyle. Past treatments include calcium channel blockers. Hypertensive end-organ damage includes kidney disease. Identifiable causes of hypertension include chronic renal disease.     Review of Systems  Constitutional: Negative for chills, fatigue, fever and unexpected weight change.  HENT: Negative for dental problem, mouth sores and trouble swallowing.   Eyes: Negative for visual disturbance.  Respiratory: Negative for cough, choking, chest tightness, shortness of breath and wheezing.   Cardiovascular: Negative for chest pain, palpitations and leg swelling.  Gastrointestinal: Negative for abdominal distention, abdominal pain, constipation, diarrhea, nausea and vomiting.  Endocrine: Positive for polydipsia and polyuria. Negative for polyphagia.  Genitourinary: Negative for dysuria, flank pain, hematuria and  urgency.  Musculoskeletal: Negative for back pain, gait problem, myalgias and neck pain.  Skin: Negative for pallor, rash and wound.  Neurological: Negative for seizures, syncope, weakness, numbness and headaches.  Psychiatric/Behavioral: Negative for confusion and dysphoric mood.    Objective:    Vitals with BMI 04/08/2020 04/01/2020 02/05/2020  Height 5\' 8"  5\' 8"  5\' 8"   Weight 218 lbs 216 lbs 216 lbs 13 oz  BMI 33.15 123456 [id number]  Systolic [id number]  Diastolic 84 81 81  Pulse 90 - 80    BP 134/84   Pulse 90   Ht 5\' 8"  (1.727 m)   Wt 218 lb (98.9 kg)   BMI 33.15 kg/m   Wt Readings from Last 3 Encounters:  04/08/20 218 lb (98.9 kg)  04/01/20 216 lb (98 kg)  02/05/20 216 lb 12.8 oz (98.3 kg)     Physical Exam Constitutional:      General: He is not in acute distress.    Appearance: He is well-developed.  HENT:     Head: Normocephalic and atraumatic.  Neck:     Thyroid: No thyromegaly.     Trachea: No tracheal deviation.  Cardiovascular:     Rate and Rhythm: Normal rate.     Pulses:          Dorsalis pedis pulses are 1+ on the right side and 1+ on the left side.       Posterior tibial pulses are 1+ on the right side and 1+ on the left side.     Heart sounds: S1 normal and S2 normal. No murmur. No gallop.   Pulmonary:     Effort: Pulmonary effort is normal. No respiratory distress.     Breath sounds: No wheezing.  Abdominal:     General: There is no distension.     Tenderness: There is no abdominal tenderness. There is no guarding.  Musculoskeletal:     Right shoulder: No swelling or deformity.     Cervical back: Normal range of motion and neck supple.  Skin:    General: Skin is warm and dry.     Findings: No rash.     Nails: There is no clubbing.  Neurological:     Mental Status: He is alert and oriented to person, place, and time.     Cranial Nerves: No cranial nerve deficit.     Sensory: No sensory deficit.     Gait: Gait normal.     Deep Tendon Reflexes:  Reflexes are normal and symmetric.  Psychiatric:        Speech: Speech normal.  Behavior: Behavior normal. Behavior is cooperative.        Thought Content: Thought content normal.        Judgment: Judgment normal.     CMP     Component Value Date/Time   NA 132 (L) 01/04/2020 1000   K 4.6 01/04/2020 1000   CL 97 (L) 01/04/2020 1000   CO2 26 01/04/2020 1000   GLUCOSE 114 (H) 01/04/2020 1000   BUN 15 01/04/2020 1000   CREATININE 1.34 (H) 01/04/2020 1000   CALCIUM 9.5 01/04/2020 1000   PROT 6.8 08/24/2019 0923   ALBUMIN 3.9 02/10/2017 0927   AST 8 (L) 08/24/2019 0923   ALT 7 (L) 08/24/2019 0923   ALKPHOS 72 02/10/2017 0927   BILITOT 0.7 08/24/2019 0923   GFRNONAA 56 (L) 01/04/2020 1000   GFRAA 65 01/04/2020 1000     Diabetic Labs (most recent): Lab Results  Component Value Date   HGBA1C 9.0 (A) 04/08/2020   HGBA1C 8.7 (H) 01/04/2020   HGBA1C 8.5 (H) 08/24/2019     Lipid Panel     Component Value Date/Time   CHOL 97 08/24/2019 0923   TRIG 164 (H) 08/24/2019 0923   HDL 32 (L) 08/24/2019 0923   CHOLHDL 3.0 08/24/2019 0923   VLDL 39 (H) 05/14/2017 0831   LDLCALC 41 08/24/2019 0923      Lab Results  Component Value Date   TSH 1.96 08/24/2019   TSH 1.34 01/31/2018   TSH 1.61 02/10/2016     Assessment & Plan:   1. Type 2 diabetes mellitus with stage 3a chronic kidney disease, with long-term current use of insulin (Norman Peters) - Norman Peters has currently uncontrolled symptomatic type 2 DM since  64 years of age.  He presents with above target glycemic profile, point-of-care A1c 9%.   Recent labs reviewed. - I had a long discussion with him about the progressive nature of diabetes and the pathology behind its complications. -his diabetes is complicated by CKD and he remains at a high risk for more acute and chronic complications which include CAD, CVA, CKD, retinopathy, and neuropathy. These are all discussed in detail with him.  - I have counseled him on diet   and weight management  by adopting a carbohydrate restricted/protein rich diet. Patient is encouraged to switch to  unprocessed or minimally processed     complex starch and increased protein intake (animal or plant source), fruits, and vegetables. -  he is advised to stick to a routine mealtimes to eat 3 meals  a day and avoid unnecessary snacks ( to snack only to correct hypoglycemia).   - he  admits there is a room for improvement in his diet and drink choices. -  Suggestion is made for him to avoid simple carbohydrates  from his diet including Cakes, Sweet Desserts / Pastries, Ice Cream, Soda (diet and regular), Sweet Tea, Candies, Chips, Cookies, Sweet Pastries,  Store Bought Juices, Alcohol in Excess of  1-2 drinks a day, Artificial Sweeteners, Coffee Creamer, and "Sugar-free" Products. This will help patient to have stable blood glucose profile and potentially avoid unintended weight gain.   - he will be scheduled with Jearld Fenton, RDN, CDE for diabetes education.  - I have approached him with the following individualized plan to manage  his diabetes and patient agrees:   - he will continue to benefit from basal insulin.  He is advised to increase his Lantus to 40 units nightly,associated with monitoring of blood glucose twice a day-daily before breakfast  and at bedtime.   - he is warned not to take insulin without proper monitoring per orders.  - he is encouraged to call clinic for blood glucose levels less than 70 or above 200 mg /dl. - he is advised to lower his dose of Metformin to 500 mg p.o. twice daily due to CKD. -He reports that he had developed unexplained cough while taking glipizide.  After he finished his glipizide, reportedly the cough has improved.  He is advised to stay off of glipizide for now.   - he is not a candidate for SGLT2 inhibitors due to concurrent renal insufficiency.  - he will be considered for incretin therapy as appropriate next visit.  - Specific  targets for  A1c;  LDL, HDL,  and Triglycerides were discussed with the patient.  2) Blood Pressure /Hypertension:  His blood pressure is controlled to target.  he is advised to continue his current medications including amlodipine 10 mg p.o. daily with breakfast . 3) Lipids/Hyperlipidemia:   Review of his recent lipid panel showed  controlled  LDL at 41 .  he  is advised to continue pravastatin 10 mg daily at bedtime. Side effects and precautions discussed with him.  4)  Weight/Diet:  Body mass index is 33.15 kg/m.  -   clearly complicating his diabetes care.   he is  a candidate for weight loss. I discussed with him the fact that loss of 5 - 10% of his  current body weight will have the most impact on his diabetes management.  Exercise, and detailed carbohydrates information provided  -  detailed on discharge instructions.  5) Chronic Care/Health Maintenance:  -he  is on a Statin medications and  is encouraged to initiate and continue to follow up with Ophthalmology, Dentist,  Podiatrist at least yearly or according to recommendations, and advised to   stay away from smoking. I have recommended yearly flu vaccine and pneumonia vaccine at least every 5 years; moderate intensity exercise for up to 150 minutes weekly; and  sleep for at least 7 hours a day.  - he is  advised to maintain close follow up with Fayrene Helper, MD for primary care needs, as well as his other providers for optimal and coordinated care.  - Time spent on this patient care encounter:  35 min, of which > 50% was spent in  counseling and the rest reviewing his blood glucose logs , discussing his hypoglycemia and hyperglycemia episodes, reviewing his current and  previous labs / studies  ( including abstraction from other facilities) and medications  doses and developing a  long term treatment plan and documenting his care.   Please refer to Patient Instructions for Blood Glucose Monitoring and Insulin/Medications Dosing  Guide"  in media tab for additional information. Please  also refer to " Patient Self Inventory" in the Media  tab for reviewed elements of pertinent patient history.  Norman Peters participated in the discussions, expressed understanding, and voiced agreement with the above plans.  All questions were answered to his satisfaction. he is encouraged to contact clinic should he have any questions or concerns prior to his return visit.   Follow up plan: - Return in about 3 months (around 07/09/2020) for F/U with Pre-visit Labs, Meter, Logs, A1c here., NV Office Urine MA, NV A1c in Office.  Glade Lloyd, MD [facility] [address] [place], [place] [address] Phone: [phone]  Fax: [phone]    04/08/2020, 11:17 AM  This note was partially dictated with voice recognition software. Similar sounding words can be transcribed inadequately or may not  be corrected upon review.

## 2020-04-09 ENCOUNTER — Other Ambulatory Visit: Payer: Self-pay | Admitting: Family Medicine

## 2020-04-09 DIAGNOSIS — I10 Essential (primary) hypertension: Secondary | ICD-10-CM

## 2020-04-11 ENCOUNTER — Ambulatory Visit: Payer: Medicare HMO | Admitting: Family Medicine

## 2020-06-11 ENCOUNTER — Other Ambulatory Visit: Payer: Self-pay | Admitting: "Endocrinology

## 2020-06-16 ENCOUNTER — Other Ambulatory Visit: Payer: Self-pay

## 2020-06-16 DIAGNOSIS — I10 Essential (primary) hypertension: Secondary | ICD-10-CM

## 2020-06-16 DIAGNOSIS — E782 Mixed hyperlipidemia: Secondary | ICD-10-CM

## 2020-07-09 ENCOUNTER — Other Ambulatory Visit: Payer: Self-pay | Admitting: Family Medicine

## 2020-07-11 ENCOUNTER — Ambulatory Visit: Payer: Medicare HMO | Admitting: Nurse Practitioner

## 2020-07-31 ENCOUNTER — Other Ambulatory Visit: Payer: Self-pay | Admitting: Family Medicine

## 2020-08-25 DIAGNOSIS — N1831 Chronic kidney disease, stage 3a: Secondary | ICD-10-CM | POA: Diagnosis not present

## 2020-08-25 DIAGNOSIS — Z794 Long term (current) use of insulin: Secondary | ICD-10-CM | POA: Diagnosis not present

## 2020-08-25 DIAGNOSIS — E1121 Type 2 diabetes mellitus with diabetic nephropathy: Secondary | ICD-10-CM | POA: Diagnosis not present

## 2020-08-26 ENCOUNTER — Other Ambulatory Visit: Payer: Self-pay

## 2020-08-26 LAB — LIPID PANEL
Cholesterol: 118 mg/dL (ref <200)
HDL: 35 mg/dL — ABNORMAL LOW (ref >=40)
LDL Cholesterol (Calc): 55 mg/dL (calc)
Non-HDL Cholesterol (Calc): 83 mg/dL (calc) (ref <130)
Total CHOL/HDL Ratio: 3.4 (calc) (ref <5.0)
Triglycerides: 221 mg/dL — ABNORMAL HIGH (ref <150)

## 2020-08-26 LAB — COMPLETE METABOLIC PANEL WITH GFR
AG Ratio: 1.4 (calc) (ref 1.0–2.5)
ALT: 14 U/L (ref 9–46)
AST: 13 U/L (ref 10–35)
Albumin: 4.2 g/dL (ref 3.6–5.1)
Alkaline phosphatase (APISO): 64 U/L (ref 35–144)
BUN/Creatinine Ratio: 13 (calc) (ref 6–22)
BUN: 19 mg/dL (ref 7–25)
CO2: 27 mmol/L (ref 20–32)
Calcium: 9.8 mg/dL (ref 8.6–10.3)
Chloride: 98 mmol/L (ref 98–110)
Creat: 1.47 mg/dL — ABNORMAL HIGH (ref 0.70–1.25)
GFR, Est African American: 58 mL/min/{1.73_m2} — ABNORMAL LOW (ref >=60)
GFR, Est Non African American: 50 mL/min/{1.73_m2} — ABNORMAL LOW (ref >=60)
Globulin: 3.1 g/dL (calc) (ref 1.9–3.7)
Glucose, Bld: 168 mg/dL — ABNORMAL HIGH (ref 65–99)
Potassium: 4.5 mmol/L (ref 3.5–5.3)
Sodium: 134 mmol/L — ABNORMAL LOW (ref 135–146)
Total Bilirubin: 1 mg/dL (ref 0.2–1.2)
Total Protein: 7.3 g/dL (ref 6.1–8.1)

## 2020-08-26 LAB — T4, FREE: Free T4: 1 ng/dL (ref 0.8–1.8)

## 2020-08-26 LAB — TSH: TSH: 2.79 mIU/L (ref 0.40–4.50)

## 2020-08-26 MED ORDER — INSULIN PEN NEEDLE 32G X 4 MM MISC: 4 refills | Status: DC

## 2020-08-27 ENCOUNTER — Other Ambulatory Visit: Payer: Self-pay | Admitting: Family Medicine

## 2020-08-27 DIAGNOSIS — I10 Essential (primary) hypertension: Secondary | ICD-10-CM

## 2020-09-02 ENCOUNTER — Other Ambulatory Visit: Payer: Self-pay

## 2020-09-02 ENCOUNTER — Ambulatory Visit (INDEPENDENT_AMBULATORY_CARE_PROVIDER_SITE_OTHER): Payer: Medicare HMO | Admitting: Family Medicine

## 2020-09-02 VITALS — BP 125/80 | HR 100 | Resp 16 | Ht 68.0 in | Wt 219.0 lb

## 2020-09-02 DIAGNOSIS — N1831 Chronic kidney disease, stage 3a: Secondary | ICD-10-CM

## 2020-09-02 DIAGNOSIS — Z125 Encounter for screening for malignant neoplasm of prostate: Secondary | ICD-10-CM | POA: Diagnosis not present

## 2020-09-02 DIAGNOSIS — Z23 Encounter for immunization: Secondary | ICD-10-CM | POA: Diagnosis not present

## 2020-09-02 DIAGNOSIS — E782 Mixed hyperlipidemia: Secondary | ICD-10-CM

## 2020-09-02 DIAGNOSIS — F419 Anxiety disorder, unspecified: Secondary | ICD-10-CM

## 2020-09-02 DIAGNOSIS — E1122 Type 2 diabetes mellitus with diabetic chronic kidney disease: Secondary | ICD-10-CM | POA: Diagnosis not present

## 2020-09-02 DIAGNOSIS — I10 Essential (primary) hypertension: Secondary | ICD-10-CM

## 2020-09-02 DIAGNOSIS — F32A Depression, unspecified: Secondary | ICD-10-CM

## 2020-09-02 DIAGNOSIS — E1165 Type 2 diabetes mellitus with hyperglycemia: Secondary | ICD-10-CM

## 2020-09-02 DIAGNOSIS — M1711 Unilateral primary osteoarthritis, right knee: Secondary | ICD-10-CM | POA: Diagnosis not present

## 2020-09-02 DIAGNOSIS — Z794 Long term (current) use of insulin: Secondary | ICD-10-CM | POA: Diagnosis not present

## 2020-09-02 DIAGNOSIS — E669 Obesity, unspecified: Secondary | ICD-10-CM

## 2020-09-02 LAB — POCT GLYCOSYLATED HEMOGLOBIN (HGB A1C): Hemoglobin A1C: 8.9 % — AB (ref 4.0–5.6)

## 2020-09-02 MED ORDER — INSULIN GLARGINE 100 UNIT/ML SOLOSTAR PEN: PEN_INJECTOR | SUBCUTANEOUS | 11 refills | Status: DC

## 2020-09-02 MED ORDER — FLUOXETINE HCL 40 MG PO CAPS: 40.0000 mg | ORAL_CAPSULE | Freq: Every day | ORAL | 3 refills | Status: DC

## 2020-09-02 MED ORDER — ROSUVASTATIN CALCIUM 5 MG PO TABS: ORAL_TABLET | ORAL | 3 refills | Status: DC

## 2020-09-02 NOTE — Patient Instructions (Addendum)
Follow-up in office with MD in 3.5 months call if you need me sooner.    Blood sugar remains uncontrolled increase Lantus dose to 45 units take it once every morning.  Continue Metformin as before.  Non fast PSA, chem 7 and eGFr and HBa1C 5 days before next visit Goal for fasting blood sugar ranges from 80 to 120 and 2 hours after any meal or at bedtime should be between 130 to 170.   Flu vaccine in office today.  New higher dose of fluoxetine 40 mg once daily.  This is for depression.  Start Crestor 5 mg 1 tablet 3 days/week Monday Wednesday Friday for heart protection.  You are referred to orthopedics re  right knee pain and instability.  Foot exam today shows evidence of neuropathy improving your blood sugar we will help to help with this.  Please use over-the-counter antifungal spray between your toes in both feet.

## 2020-09-02 NOTE — Assessment & Plan Note (Signed)
Increased pain and instability, refer Ortho

## 2020-09-02 NOTE — Assessment & Plan Note (Signed)
Uncontrolled increase lantus to 45 units daily   Norman Peters is reminded of the importance of commitment to daily physical activity for 30 minutes or more, as able and the need to limit carbohydrate intake to 30 to 60 grams per meal to help with blood sugar control.   The need to take medication as prescribed, test blood sugar as directed, and to call between visits if there is a concern that blood sugar is uncontrolled is also discussed.   Norman Peters is reminded of the importance of daily foot exam, annual eye examination, and good blood sugar, blood pressure and cholesterol control.  Diabetic Labs Latest Ref Rng & Units 08/25/2020 04/08/2020 01/07/2020 01/04/2020 08/24/2019  HbA1c 4.0 - 5.6 % - 9.0(A) - 8.7(H) 8.5(H)  Microalbumin Not Estab. ug/mL - - 188.1(H) - -  Micro/Creat Ratio 0.0 - 30.0 mg/g creat - - - - -  Chol <200 mg/dL 118 - - - 97  HDL > OR = 40 mg/dL 35(L) - - - 32(L)  Calc LDL mg/dL (calc) 55 - - - 41  Triglycerides <150 mg/dL 221(H) - - - 164(H)  Creatinine 0.70 - 1.25 mg/dL 1.47(H) - - 1.34(H) 1.35(H)   BP/Weight 09/02/2020 04/08/2020 04/01/2020 02/05/2020 01/11/2020 09/18/2019 16/11/958  Systolic BP 454 098 119 147 829 - 562  Diastolic BP 80 84 81 81 84 - 84  Wt. (Lbs) 219 218 216 216.8 219.04 215 215.08  BMI 33.3 33.15 32.84 32.96 33.3 32.69 32.7   Foot/eye exam completion dates Latest Ref Rng & Units 09/02/2020 12/31/2019  Eye Exam No Retinopathy - No Retinopathy  Foot Form Completion - Done -

## 2020-09-02 NOTE — Progress Notes (Signed)
Norman Peters     MRN: [id number]      DOB: Jun 12, 1956   HPI Norman Peters is here for follow up and re-evaluation of chronic medical conditions, medication management and review of any available recent lab and radiology data.  Preventive health is updated, specifically  Cancer screening and Immunization.   Questions or concerns regarding consultations or procedures which the PT has had in the interim are  addressed. The PT denies any adverse reactions to current medications since the last visit.  Bilateral knee pain, with recent flare C/o uncontrolled blood sugars and inconsistency with diet and testing Denies polyuria, polydipsia, blurred vision , or hypoglycemic episodes.  ROS Denies recent fever or chills. Denies sinus pressure, nasal congestion, ear pain or sore throat. Denies chest congestion, productive cough or wheezing. Denies chest pains, palpitations and leg swelling Denies abdominal pain, nausea, vomiting,diarrhea or constipation.   Denies dysuria, frequency, hesitancy or incontinence. Instability of right knee with increased pain, uses cane for support x 4 years Denies headaches, seizures, numbness, or tingling. Denies uncontrolled depression, anxiety or insomnia. Denies skin break down or rash.   PE  BP 125/80   Pulse 100   Resp 16   Ht 5\' 8"  (1.727 m)   Wt 219 lb (99.3 kg)   SpO2 95%   BMI 33.30 kg/m   Patient alert and oriented and in no cardiopulmonary distress.  HEENT: No facial asymmetry, EOMI,     Neck supple .  Chest: Clear to auscultation bilaterally.  CVS: S1, S2 no murmurs, no S3.Regular rate.  ABD: Soft non tender.   Ext: No edema  MS: Adequate ROM spine, shoulders, hips and markedly reduced in knees.  Skin: Intact, no ulcerations or rash noted.  Psych: Good eye contact, normal affect. Memory intact not anxious or depressed appearing.  CNS: CN 2-12 intact, power,  normal throughout.no focal deficits noted.   Assessment & Plan  Type 2  diabetes mellitus with hyperglycemia (HCC) Uncontrolled increase lantus to 45 units daily   Norman Peters is reminded of the importance of commitment to daily physical activity for 30 minutes or more, as able and the need to limit carbohydrate intake to 30 to 60 grams per meal to help with blood sugar control.   The need to take medication as prescribed, test blood sugar as directed, and to call between visits if there is a concern that blood sugar is uncontrolled is also discussed.   Norman Peters is reminded of the importance of daily foot exam, annual eye examination, and good blood sugar, blood pressure and cholesterol control.  Diabetic Labs Latest Ref Rng & Units 08/25/2020 04/08/2020 01/07/2020 01/04/2020 08/24/2019  HbA1c 4.0 - 5.6 % - 9.0(A) - 8.7(H) 8.5(H)  Microalbumin Not Estab. ug/mL - - 188.1(H) - -  Micro/Creat Ratio 0.0 - 30.0 mg/g creat - - - - -  Chol <200 mg/dL 118 - - - 97  HDL > OR = 40 mg/dL 35(L) - - - 32(L)  Calc LDL mg/dL (calc) 55 - - - 41  Triglycerides <150 mg/dL 221(H) - - - 164(H)  Creatinine 0.70 - 1.25 mg/dL 1.47(H) - - 1.34(H) 1.35(H)   BP/Weight 09/02/2020 04/08/2020 04/01/2020 02/05/2020 01/11/2020 09/18/2019 40/0/8676  Systolic BP 195 093 267 124 580 - 998  Diastolic BP 80 84 81 81 84 - 84  Wt. (Lbs) 219 218 216 216.8 219.04 215 215.08  BMI 33.3 33.15 32.84 32.96 33.3 32.69 32.7   Foot/eye exam completion dates Latest Ref Rng &  Units 09/02/2020 12/31/2019  Eye Exam No Retinopathy - No Retinopathy  Foot Form Completion - Done -        Osteoarthritis of right knee Increased pain and instability, refer Ortho  Anxiety and depression Increase fluoxetine dose and re assess  Essential hypertension, benign Controlled, no change in medication DASH diet and commitment to daily physical activity for a minimum of 30 minutes discussed and encouraged, as a part of hypertension management. The importance of attaining a healthy weight is also discussed.  BP/Weight  09/02/2020 04/08/2020 04/01/2020 02/05/2020 01/11/2020 09/18/2019 31/03/1760  Systolic BP 607 371 062 694 854 - 627  Diastolic BP 80 84 81 81 84 - 84  Wt. (Lbs) 219 218 216 216.8 219.04 215 215.08  BMI 33.3 33.15 32.84 32.96 33.3 32.69 32.7       Type 2 diabetes mellitus with stage 3a chronic kidney disease, with long-term current use of insulin [facility]) Norman Peters is reminded of the importance of commitment to daily physical activity for 30 minutes or more, as able and the need to limit carbohydrate intake to 30 to 60 grams per meal to help with blood sugar control.   The need to take medication as prescribed, test blood sugar as directed, and to call between visits if there is a concern that blood sugar is uncontrolled is also discussed.   Norman Peters is reminded of the importance of daily foot exam, annual eye examination, and good blood sugar, blood pressure and cholesterol control. Uncontrolled , increase lantus to 45 units daily  Diabetic Labs Latest Ref Rng & Units 09/02/2020 08/25/2020 04/08/2020 01/07/2020 01/04/2020  HbA1c 4.0 - 5.6 % 8.9(A) - 9.0(A) - 8.7(H)  Microalbumin Not Estab. ug/mL - - - 188.1(H) -  Micro/Creat Ratio 0.0 - 30.0 mg/g creat - - - - -  Chol <200 mg/dL - 118 - - -  HDL > OR = 40 mg/dL - 35(L) - - -  Calc LDL mg/dL (calc) - 55 - - -  Triglycerides <150 mg/dL - 221(H) - - -  Creatinine 0.70 - 1.25 mg/dL - 1.47(H) - - 1.34(H)   BP/Weight 09/02/2020 04/08/2020 04/01/2020 02/05/2020 01/11/2020 09/18/2019 01/11/92  Systolic BP 818 299 371 696 789 - 381  Diastolic BP 80 84 81 81 84 - 84  Wt. (Lbs) 219 218 216 216.8 219.04 215 215.08  BMI 33.3 33.15 32.84 32.96 33.3 32.69 32.7   Foot/eye exam completion dates Latest Ref Rng & Units 09/02/2020 12/31/2019  Eye Exam No Retinopathy - No Retinopathy  Foot Form Completion - Done -        Mixed hyperlipidemia Hyperlipidemia:Low fat diet discussed and encouraged.   Lipid Panel  Lab Results  Component Value Date   CHOL 118  08/25/2020   HDL 35 (L) 08/25/2020   LDLCALC 55 08/25/2020   TRIG 221 (H) 08/25/2020   CHOLHDL 3.4 08/25/2020   Uncontrolled , need to reduce fat intake    Obesity (BMI 30.0-34.9)  Patient re-educated about  the importance of commitment to a  minimum of 150 minutes of exercise per week as able.  The importance of healthy food choices with portion control discussed, as well as eating regularly and within a 12 hour window most days. The need to choose "clean , [place]" food 50 to 75% of the time is discussed, as well as to make water the primary drink and set a goal of 64 ounces water daily.    Weight /BMI 09/02/2020 04/08/2020 04/01/2020  WEIGHT 219 lb  218 lb 216 lb  HEIGHT 5\' 8"  5\' 8"  5\' 8"   BMI 33.3 kg/m2 33.15 kg/m2 32.84 kg/m2

## 2020-09-07 ENCOUNTER — Encounter: Payer: Self-pay | Admitting: Family Medicine

## 2020-09-07 NOTE — Assessment & Plan Note (Signed)
Hyperlipidemia:Low fat diet discussed and encouraged.   Lipid Panel  Lab Results  Component Value Date   CHOL 118 08/25/2020   HDL 35 (L) 08/25/2020   LDLCALC 55 08/25/2020   TRIG 221 (H) 08/25/2020   CHOLHDL 3.4 08/25/2020   Uncontrolled , need to reduce fat intake

## 2020-09-07 NOTE — Assessment & Plan Note (Signed)
Increase fluoxetine dose and re assess

## 2020-09-07 NOTE — Assessment & Plan Note (Signed)
Controlled, no change in medication DASH diet and commitment to daily physical activity for a minimum of 30 minutes discussed and encouraged, as a part of hypertension management. The importance of attaining a healthy weight is also discussed.  BP/Weight 09/02/2020 04/08/2020 04/01/2020 02/05/2020 01/11/2020 09/18/2019 99/12/7798  Systolic BP 447 158 063 868 548 - 830  Diastolic BP 80 84 81 81 84 - 84  Wt. (Lbs) 219 218 216 216.8 219.04 215 215.08  BMI 33.3 33.15 32.84 32.96 33.3 32.69 32.7

## 2020-09-07 NOTE — Assessment & Plan Note (Signed)
  Patient re-educated about  the importance of commitment to a  minimum of 150 minutes of exercise per week as able.  The importance of healthy food choices with portion control discussed, as well as eating regularly and within a 12 hour window most days. The need to choose "clean , [place]" food 50 to 75% of the time is discussed, as well as to make water the primary drink and set a goal of 64 ounces water daily.    Weight /BMI 09/02/2020 04/08/2020 04/01/2020  WEIGHT 219 lb 218 lb 216 lb  HEIGHT 5\' 8"  5\' 8"  5\' 8"   BMI 33.3 kg/m2 33.15 kg/m2 32.84 kg/m2

## 2020-09-07 NOTE — Assessment & Plan Note (Signed)
[name] is reminded of the importance of commitment to daily physical activity for 30 minutes or more, as able and the need to limit carbohydrate intake to 30 to 60 grams per meal to help with blood sugar control.   The need to take medication as prescribed, test blood sugar as directed, and to call between visits if there is a concern that blood sugar is uncontrolled is also discussed.   [name] is reminded of the importance of daily foot exam, annual eye examination, and good blood sugar, blood pressure and cholesterol control. Uncontrolled , increase lantus to 45 units daily  Diabetic Labs Latest Ref Rng & Units 09/02/2020 08/25/2020 04/08/2020 01/07/2020 01/04/2020  HbA1c 4.0 - 5.6 % 8.9(A) - 9.0(A) - 8.7(H)  Microalbumin Not Estab. ug/mL - - - 188.1(H) -  Micro/Creat Ratio 0.0 - 30.0 mg/g creat - - - - -  Chol <200 mg/dL - 118 - - -  HDL > OR = 40 mg/dL - 35(L) - - -  Calc LDL mg/dL (calc) - 55 - - -  Triglycerides <150 mg/dL - 221(H) - - -  Creatinine 0.70 - 1.25 mg/dL - 1.47(H) - - 1.34(H)   BP/Weight 09/02/2020 04/08/2020 04/01/2020 02/05/2020 01/11/2020 09/18/2019 40/07/8118  Systolic BP 147 829 562 130 865 - 784  Diastolic BP 80 84 81 81 84 - 84  Wt. (Lbs) 219 218 216 216.8 219.04 215 215.08  BMI 33.3 33.15 32.84 32.96 33.3 32.69 32.7   Foot/eye exam completion dates Latest Ref Rng & Units 09/02/2020 12/31/2019  Eye Exam No Retinopathy - No Retinopathy  Foot Form Completion - Done -

## 2020-09-11 ENCOUNTER — Encounter: Payer: Self-pay | Admitting: Orthopedic Surgery

## 2020-09-12 ENCOUNTER — Telehealth (INDEPENDENT_AMBULATORY_CARE_PROVIDER_SITE_OTHER): Payer: Medicare HMO | Admitting: Family Medicine

## 2020-09-12 ENCOUNTER — Other Ambulatory Visit: Payer: Self-pay

## 2020-09-12 ENCOUNTER — Telehealth: Payer: Medicare HMO | Admitting: Family Medicine

## 2020-09-12 ENCOUNTER — Encounter: Payer: Self-pay | Admitting: Family Medicine

## 2020-09-12 VITALS — Ht 68.0 in | Wt 216.0 lb

## 2020-09-12 DIAGNOSIS — J302 Other seasonal allergic rhinitis: Secondary | ICD-10-CM

## 2020-09-12 DIAGNOSIS — R0981 Nasal congestion: Secondary | ICD-10-CM | POA: Insufficient documentation

## 2020-09-12 MED ORDER — FLUTICASONE PROPIONATE 50 MCG/ACT NA SUSP: 2.0000 | Freq: Every day | NASAL | 2 refills | Status: DC

## 2020-09-12 NOTE — Assessment & Plan Note (Signed)
Seasonal allergy treatment in past. Most likely triggered from immune response to flu vaccine and season change. Flonase ordered, increase water as well. No S&S of active infection in conversation. Precautions on ED visit reviewed If not improved by Mon/Tues we will bring into office for ear check.

## 2020-09-12 NOTE — Patient Instructions (Signed)
  HAPPY FALL!  I appreciate the opportunity to provide you with care for your health and wellness. Today we discussed: recent allergy like smyptoms  Follow up: as scheduled  No labs or referrals today  Flonase to take daily as directed Call if not better next week  Please continue to practice social distancing to keep you, your family, and our community safe.  If you must go out, please wear a mask and practice good handwashing.  It was a pleasure to see you and I look forward to continuing to work together on your health and well-being. Please do not hesitate to call the office if you need care or have questions about your care.  Have a wonderful day and week. With Gratitude, Cherly Beach, DNP, AGNP-BC

## 2020-09-12 NOTE — Assessment & Plan Note (Signed)
Congestion most likely related to allergy and or combo with immune response from flu vaccine. S&S are not pointing to active infection. We will not do anbx right now. Treatment w Flonase to help open passages. Advised to call if not better next week.

## 2020-09-12 NOTE — Progress Notes (Signed)
Virtual Visit via Telephone Note   This visit type was conducted due to national recommendations for restrictions regarding the COVID-19 Pandemic (e.g. social distancing) in an effort to limit this patient's exposure and mitigate transmission in our community.  Due to his co-morbid illnesses, this patient is at least at moderate risk for complications without adequate follow up.  This format is felt to be most appropriate for this patient at this time.  The patient did not have access to video technology/had technical difficulties with video requiring transitioning to audio format only (telephone).  All issues noted in this document were discussed and addressed.  No physical exam could be performed with this format.    Evaluation Performed:  Follow-up visit  Date:  09/12/2020   ID:  Norman Peters, DOB 1956/08/14, MRN [id number]  Patient Location: Home Provider Location: Other:  offsite  Location of Patient: Home Location of Provider: Telehealth Consent was obtain for visit to be over via telehealth. I verified that I am speaking with the correct person using two identifiers.  PCP:  Fayrene Helper, MD   Chief Complaint:  Recent onset of nasal congestion and muffled hearing  History of Present Illness:    Norman Peters is a 64 y.o. male with history as stated below. Reports he got Flu shot 10/26. Started feeling poorly within 24 hours. But has since improved some. Today he denies facial pain.  Denies chewing pain. Denies sore throat now- but was hurting at first. Denies drainage from nose- stuffy. Both ears are stopped up. Muffled hearing. Feels like water is in them. Denies ear pain. No fevers, no chills No body aches or headaches- did have some but improved Denies having cough or shortness of breath.  No allergies in past, but does have history of being treated for similar issue last year.  The patient does not have symptoms concerning for COVID-19 infection (fever,  chills, cough, or new shortness of breath).   Past Medical, Surgical, Social History, Allergies, and Medications have been Reviewed.  Past Medical History:  Diagnosis Date  . Anxiety   . Arthritis   . Depression 2001  . Diabetes mellitus 2010  . GERD (gastroesophageal reflux disease)   . Hyperlipidemia   . Hypertension 2005   Past Surgical History:  Procedure Laterality Date  . COLONOSCOPY N/A 07/07/2016   Procedure: COLONOSCOPY;  Surgeon: Rogene Houston, MD;  Location: [facility];  Service: Endoscopy;  Laterality: N/A;  1030  . FINGER SURGERY Left 1974   fifth , trauma on job, had to replace/repair finger  . HERNIA REPAIR  7616   x3, umbilical and bilateral hernia     Current Meds  Medication Sig  . amLODipine (NORVASC) 10 MG tablet TAKE 1 TABLET EVERY DAY  . aspirin EC 81 MG tablet Take 81 mg by mouth daily.  . busPIRone (BUSPAR) 5 MG tablet TAKE 1 TABLET TWICE DAILY  . Cholecalciferol (VITAMIN D3) 400 units CAPS Take 2 capsules by mouth daily.  Marland Kitchen FLUoxetine (PROZAC) 40 MG capsule Take 1 capsule (40 mg total) by mouth daily.  . insulin glargine (LANTUS) 100 UNIT/ML Solostar Pen Inject 45 units under the skin every morning  . Insulin Pen Needle 32G X 4 MM MISC Use daily to inject insulin DX e11.65  . metFORMIN (GLUCOPHAGE) 500 MG tablet TAKE 1 TABLET TWICE DAILY WITH MEALS  . Multiple Vitamins-Minerals (MULTI-BETIC DIABETES) TABS Take 1 tablet by mouth daily.  . rosuvastatin (CRESTOR) 5 MG tablet Take  one tablet by mouth every Monday, Wednesday and Friday  . spironolactone (ALDACTONE) 50 MG tablet TAKE 1 TABLET EVERY DAY  . tadalafil (CIALIS) 10 MG tablet Take 1 tablet (10 mg total) by mouth daily as needed for erectile dysfunction.  . TRUE METRIX BLOOD GLUCOSE TEST test strip TEST ONE TIME DAILY  . TRUEplus Lancets 28G MISC USE  TO  TEST ONE TIME DAILY     Allergies:   Ibuprofen, Lisinopril, Other, and Strawberry [berry]   ROS:   Please see the history of present  illness.     All other systems reviewed and are negative.   Labs/Other Tests and Data Reviewed:    Recent Labs: 01/04/2020: Hemoglobin 13.1; Platelets 314 08/25/2020: ALT 14; BUN 19; Creat 1.47; Potassium 4.5; Sodium 134; TSH 2.79   Recent Lipid Panel Lab Results  Component Value Date/Time   CHOL 118 08/25/2020 09:27 AM   TRIG 221 (H) 08/25/2020 09:27 AM   HDL 35 (L) 08/25/2020 09:27 AM   CHOLHDL 3.4 08/25/2020 09:27 AM   LDLCALC 55 08/25/2020 09:27 AM    Wt Readings from Last 3 Encounters:  09/12/20 216 lb (98 kg)  09/02/20 219 lb (99.3 kg)  04/08/20 218 lb (98.9 kg)     Objective:    Vital Signs:  Ht 5\' 8"  (1.727 m)   Wt 216 lb (98 kg)   BMI 32.84 kg/m    VITAL SIGNS:  reviewed GEN:  no acute distress RESPIRATORY:  no shortness of breath in conversation PSYCH:  normal affect and mood  ASSESSMENT & PLAN:    1. Seasonal allergies  - fluticasone (FLONASE) 50 MCG/ACT nasal spray; Place 2 sprays into both nostrils daily.  Dispense: 16 g; Refill: 2  2. Nasal congestion  - fluticasone (FLONASE) 50 MCG/ACT nasal spray; Place 2 sprays into both nostrils daily.  Dispense: 16 g; Refill: 2    Time:   Today, I have spent 7 minutes with the patient with telehealth technology discussing the above problems.     Medication Adjustments/Labs and Tests Ordered: Current medicines are reviewed at length with the patient today.  Concerns regarding medicines are outlined above.   Tests Ordered: No orders of the defined types were placed in this encounter.   Medication Changes: No orders of the defined types were placed in this encounter.    Note: This dictation was prepared with Dragon dictation along with smaller phrase technology. Similar sounding words can be transcribed inadequately or may not be corrected upon review. Any transcriptional errors that result from this process are unintentional.      Disposition:  Follow up 12/17/2020  Signed, Perlie Mayo, NP   09/12/2020 9:36 AM     [place]

## 2020-10-29 ENCOUNTER — Other Ambulatory Visit: Payer: Self-pay | Admitting: "Endocrinology

## 2020-10-29 ENCOUNTER — Other Ambulatory Visit: Payer: Self-pay | Admitting: Family Medicine

## 2020-10-30 ENCOUNTER — Emergency Department (HOSPITAL_COMMUNITY)
Admission: EM | Admit: 2020-10-30 | Discharge: 2020-10-30 | Disposition: A | Discharge status: Home / Self Care | Payer: Medicare HMO | Attending: Emergency Medicine | Admitting: Emergency Medicine

## 2020-10-30 ENCOUNTER — Other Ambulatory Visit: Payer: Self-pay

## 2020-10-30 ENCOUNTER — Emergency Department (HOSPITAL_COMMUNITY): Payer: Medicare HMO

## 2020-10-30 ENCOUNTER — Encounter (HOSPITAL_COMMUNITY): Payer: Self-pay | Admitting: Emergency Medicine

## 2020-10-30 DIAGNOSIS — E782 Mixed hyperlipidemia: Secondary | ICD-10-CM | POA: Diagnosis not present

## 2020-10-30 DIAGNOSIS — E1122 Type 2 diabetes mellitus with diabetic chronic kidney disease: Secondary | ICD-10-CM | POA: Insufficient documentation

## 2020-10-30 DIAGNOSIS — Z794 Long term (current) use of insulin: Secondary | ICD-10-CM | POA: Diagnosis not present

## 2020-10-30 DIAGNOSIS — M7918 Myalgia, other site: Secondary | ICD-10-CM

## 2020-10-30 DIAGNOSIS — I700 Atherosclerosis of aorta: Secondary | ICD-10-CM | POA: Diagnosis not present

## 2020-10-30 DIAGNOSIS — M79652 Pain in left thigh: Secondary | ICD-10-CM | POA: Insufficient documentation

## 2020-10-30 DIAGNOSIS — I2510 Atherosclerotic heart disease of native coronary artery without angina pectoris: Secondary | ICD-10-CM | POA: Diagnosis not present

## 2020-10-30 DIAGNOSIS — N289 Disorder of kidney and ureter, unspecified: Secondary | ICD-10-CM | POA: Diagnosis not present

## 2020-10-30 DIAGNOSIS — D649 Anemia, unspecified: Secondary | ICD-10-CM

## 2020-10-30 DIAGNOSIS — M5430 Sciatica, unspecified side: Secondary | ICD-10-CM | POA: Diagnosis not present

## 2020-10-30 DIAGNOSIS — Z87891 Personal history of nicotine dependence: Secondary | ICD-10-CM | POA: Insufficient documentation

## 2020-10-30 DIAGNOSIS — J984 Other disorders of lung: Secondary | ICD-10-CM | POA: Diagnosis not present

## 2020-10-30 DIAGNOSIS — K5730 Diverticulosis of large intestine without perforation or abscess without bleeding: Secondary | ICD-10-CM | POA: Diagnosis not present

## 2020-10-30 DIAGNOSIS — Z79899 Other long term (current) drug therapy: Secondary | ICD-10-CM | POA: Insufficient documentation

## 2020-10-30 DIAGNOSIS — Z7982 Long term (current) use of aspirin: Secondary | ICD-10-CM | POA: Insufficient documentation

## 2020-10-30 DIAGNOSIS — N1831 Chronic kidney disease, stage 3a: Secondary | ICD-10-CM | POA: Insufficient documentation

## 2020-10-30 DIAGNOSIS — I129 Hypertensive chronic kidney disease with stage 1 through stage 4 chronic kidney disease, or unspecified chronic kidney disease: Secondary | ICD-10-CM | POA: Diagnosis not present

## 2020-10-30 DIAGNOSIS — E1169 Type 2 diabetes mellitus with other specified complication: Secondary | ICD-10-CM | POA: Insufficient documentation

## 2020-10-30 DIAGNOSIS — M1712 Unilateral primary osteoarthritis, left knee: Secondary | ICD-10-CM | POA: Diagnosis not present

## 2020-10-30 DIAGNOSIS — M25562 Pain in left knee: Secondary | ICD-10-CM | POA: Insufficient documentation

## 2020-10-30 DIAGNOSIS — M79605 Pain in left leg: Secondary | ICD-10-CM | POA: Diagnosis not present

## 2020-10-30 DIAGNOSIS — M549 Dorsalgia, unspecified: Secondary | ICD-10-CM | POA: Diagnosis not present

## 2020-10-30 DIAGNOSIS — K7689 Other specified diseases of liver: Secondary | ICD-10-CM | POA: Diagnosis not present

## 2020-10-30 LAB — CBC WITH DIFFERENTIAL/PLATELET
Basophils Absolute: 0 10*3/uL (ref 0.0–0.1)
Basophils Relative: 0 %
Eosinophils Absolute: 0.5 10*3/uL (ref 0.0–0.5)
Eosinophils Relative: 8 %
HCT: 36.6 % — ABNORMAL LOW (ref 39.0–52.0)
Hemoglobin: 12.3 g/dL — ABNORMAL LOW (ref 13.0–17.0)
Lymphocytes Relative: 47 %
Lymphs Abs: 3 10*3/uL (ref 0.7–4.0)
MCH: 37.6 pg — ABNORMAL HIGH (ref 26.0–34.0)
MCHC: 33.6 g/dL (ref 30.0–36.0)
MCV: 111.9 fL — ABNORMAL HIGH (ref 80.0–100.0)
Monocytes Absolute: 0.3 10*3/uL (ref 0.1–1.0)
Monocytes Relative: 4 %
Neutro Abs: 2.5 10*3/uL (ref 1.7–7.7)
Neutrophils Relative %: 39 %
Other: 2 %
Platelets: 326 10*3/uL (ref 150–400)
RBC: 3.27 MIL/uL — ABNORMAL LOW (ref 4.22–5.81)
RDW: 14.8 % (ref 11.5–15.5)
WBC: 6.3 10*3/uL (ref 4.0–10.5)
nRBC: 0 % (ref 0.0–0.2)

## 2020-10-30 LAB — PATHOLOGIST SMEAR REVIEW

## 2020-10-30 LAB — BASIC METABOLIC PANEL
Anion gap: 10 (ref 5–15)
BUN: 18 mg/dL (ref 8–23)
CO2: 25 mmol/L (ref 22–32)
Calcium: 9.2 mg/dL (ref 8.9–10.3)
Chloride: 100 mmol/L (ref 98–111)
Creatinine, Ser: 1.29 mg/dL — ABNORMAL HIGH (ref 0.61–1.24)
GFR, Estimated: >60 mL/min (ref >60)
Glucose, Bld: 308 mg/dL — ABNORMAL HIGH (ref 70–99)
Potassium: 4.1 mmol/L (ref 3.5–5.1)
Sodium: 135 mmol/L (ref 135–145)

## 2020-10-30 LAB — LACTIC ACID, PLASMA: Lactic Acid, Venous: 1.7 mmol/L (ref 0.5–1.9)

## 2020-10-30 MED ORDER — DIAZEPAM 5 MG/ML IJ SOLN
2.5000 mg | Freq: Once | INTRAMUSCULAR | Status: AC
  Administered 2020-10-30: 08:00:00 2.5 mg via INTRAVENOUS
  Filled 2020-10-30: qty 2

## 2020-10-30 MED ORDER — MORPHINE SULFATE (PF) 4 MG/ML IV SOLN
4.0000 mg | Freq: Once | INTRAVENOUS | Status: AC
  Administered 2020-10-30: 06:00:00 4 mg via INTRAVENOUS
  Filled 2020-10-30: qty 1

## 2020-10-30 MED ORDER — NAPROXEN 500 MG PO TABS: 500.0000 mg | ORAL_TABLET | Freq: Two times a day (BID) | ORAL | 0 refills | Status: DC | PRN

## 2020-10-30 MED ORDER — MORPHINE SULFATE (PF) 4 MG/ML IV SOLN
4.0000 mg | Freq: Once | INTRAVENOUS | Status: AC
Start: 2020-10-30 — End: 2020-10-30
  Administered 2020-10-30: 07:00:00 4 mg via INTRAVENOUS
  Filled 2020-10-30: qty 1

## 2020-10-30 MED ORDER — IOHEXOL 350 MG/ML SOLN
100.0000 mL | Freq: Once | INTRAVENOUS | Status: AC | PRN
  Administered 2020-10-30: 07:00:00 100 mL via INTRAVENOUS

## 2020-10-30 MED ORDER — METHOCARBAMOL 500 MG PO TABS: 500.0000 mg | ORAL_TABLET | Freq: Three times a day (TID) | ORAL | 0 refills | Status: DC | PRN

## 2020-10-30 MED ORDER — ONDANSETRON HCL 4 MG/2ML IJ SOLN
4.0000 mg | Freq: Once | INTRAMUSCULAR | Status: AC
  Administered 2020-10-30: 06:00:00 4 mg via INTRAVENOUS
  Filled 2020-10-30: qty 2

## 2020-10-30 NOTE — ED Provider Notes (Signed)
[facility] EMERGENCY DEPARTMENT Provider Note   CSN: [id number] Arrival date & time: 10/30/20  [id number]   History Chief Complaint  Patient presents with  . Back Pain    Norman Peters is a 64 y.o. male.  The history is provided by the patient.  Back Pain He has history of hypertension, diabetes, hyperlipidemia states he was awakened this morning by severe pain in his left gluteal area and left knee.  He feels that they are connected.  Pain seems to be worse with exposure to cold, better with heat.  He denies any weakness, numbness, tingling.  He denies any bowel or bladder dysfunction.  Pain is rated at 10/10.  He has not taken anything for it.  He does admit that he has been doing some work around [employer] a new floor and carrying the materials and also doing some work on his toilet.  He has history of sciatica, but this pain is much worse than any sciatica pain he has had in the past.  He had no pain when he went to bed.  Past Medical History:  Diagnosis Date  . Anxiety   . Arthritis   . Depression 2001  . Diabetes mellitus 2010  . GERD (gastroesophageal reflux disease)   . Hyperlipidemia   . Hypertension 2005    Patient Active Problem List   Diagnosis Date Noted  . Seasonal allergies 09/12/2020  . Nasal congestion 09/12/2020  . Osteoarthritis of right knee 09/02/2020  . Type 2 diabetes mellitus with stage 3a chronic kidney disease, with long-term current use of insulin ([place]) 02/05/2020  . Vitamin D deficiency 01/11/2020  . Obesity (BMI 30.0-34.9) 09/03/2019  . Dental caries 09/03/2019  . Chronic elbow pain, left 06/23/2019  . Type 2 diabetes mellitus with hyperglycemia ([place]) 05/03/2019  . Chest pain with moderate risk of acute coronary syndrome 09/03/2018  . Mixed hyperlipidemia 05/29/2017  . Spasm of thoracic back muscle 10/06/2016  . Back pain of lumbar region with sciatica 03/18/2016  . Pain in joint involving multiple sites 02/12/2016  . GERD  (gastroesophageal reflux disease) 02/12/2016  . Essential hypertension, benign 02/10/2016  . Anxiety and depression 02/10/2016    Past Surgical History:  Procedure Laterality Date  . COLONOSCOPY N/A 07/07/2016   Procedure: COLONOSCOPY;  Surgeon: Rogene Houston, MD;  Location: [facility];  Service: Endoscopy;  Laterality: N/A;  1030  . FINGER SURGERY Left 1974   fifth , trauma on job, had to replace/repair finger  . HERNIA REPAIR  [id number]   x3, umbilical and bilateral hernia       Family History  Problem Relation Age of Onset  . Stroke Mother     Social History   Tobacco Use  . Smoking status: Former Smoker    Packs/day: 0.50    Years: 30.00    Pack years: 15.00    Types: Cigarettes    Quit date: 01/20/2011    Years since quitting: 9.7  . Smokeless tobacco: Never Used  Vaping Use  . Vaping Use: Never used  Substance Use Topics  . Alcohol use: No  . Drug use: No    Home Medications Prior to Admission medications   Medication Sig Start Date End Date Taking? Authorizing Provider  amLODipine (NORVASC) 10 MG tablet TAKE 1 TABLET EVERY DAY 08/28/20   Fayrene Helper, MD  aspirin EC 81 MG tablet Take 81 mg by mouth daily. 03/08/16   Fayrene Helper, MD  busPIRone (BUSPAR) 5 MG tablet  TAKE 1 TABLET TWICE DAILY 08/16/19   Fayrene Helper, MD  Cholecalciferol (VITAMIN D3) 400 units CAPS Take 2 capsules by mouth daily.    [provider]  FLUoxetine (PROZAC) 40 MG capsule Take 1 capsule (40 mg total) by mouth daily. 09/02/20   Fayrene Helper, MD  fluticasone (FLONASE) 50 MCG/ACT nasal spray Place 2 sprays into both nostrils daily. 09/12/20   Perlie Mayo, NP  insulin glargine (LANTUS) 100 UNIT/ML Solostar Pen Inject 45 units under the skin every morning 09/02/20   Fayrene Helper, MD  Insulin Pen Needle 32G X 4 MM MISC Use daily to inject insulin DX e11.65 08/26/20   Fayrene Helper, MD  metFORMIN (GLUCOPHAGE) 500 MG tablet TAKE 1 TABLET TWICE  DAILY WITH MEALS 06/12/20   Nida, Marella Chimes, MD  Multiple Vitamins-Minerals (MULTI-BETIC DIABETES) TABS Take 1 tablet by mouth daily. 01/11/20   Perlie Mayo, NP  rosuvastatin (CRESTOR) 5 MG tablet Take one tablet by mouth every Monday, Wednesday and Friday 09/02/20   Fayrene Helper, MD  spironolactone (ALDACTONE) 50 MG tablet TAKE 1 TABLET EVERY DAY 08/28/20   Fayrene Helper, MD  tadalafil (CIALIS) 10 MG tablet Take 1 tablet (10 mg total) by mouth daily as needed for erectile dysfunction. 03/28/19   Perlie Mayo, NP  TRUE METRIX BLOOD GLUCOSE TEST test strip TEST ONE TIME DAILY 08/28/20   Fayrene Helper, MD  TRUEplus Lancets 28G MISC USE  TO  TEST ONE TIME DAILY 01/24/20   Fayrene Helper, MD    Allergies    Ibuprofen, Lisinopril, Other, and Strawberry [berry]  Review of Systems   Review of Systems  Musculoskeletal: Positive for back pain.  All other systems reviewed and are negative.   Physical Exam Updated Vital Signs There were no vitals taken for this visit.  Physical Exam Vitals and nursing note reviewed.   64 year old male, resting comfortably and in no acute distress. Vital signs are significant for elevated blood pressure. Oxygen saturation is 99%, which is normal. Head is normocephalic and atraumatic. PERRLA, EOMI. Oropharynx is clear. Neck is nontender and supple without adenopathy or JVD. Back is mildly tender in the lower lumbar area.  There is no CVA tenderness. Lungs are clear without rales, wheezes, or rhonchi. Chest is nontender. Heart has regular rate and rhythm without murmur. Abdomen is soft, flat, nontender without masses or hepatosplenomegaly and peristalsis is normoactive. Extremities: There is moderate tenderness to palpation diffusely throughout the left knee but there is no swelling or deformity.  There is no knee effusion.  There is marked pain with any passive movement of the left knee as well as any movement of the left hip.   Unable to adequately test for straight leg raise given the pain with any movement.  Capillary refill is prompt.  Full range of motion of all other joints without pain.  There is no edema. Skin is warm and dry without rash. Neurologic: Mental status is normal, cranial nerves are intact, there are no motor or sensory deficits.  ED Results / Procedures / Treatments   Labs (all labs ordered are listed, but only abnormal results are displayed) Labs Reviewed  BASIC METABOLIC PANEL - Abnormal; Notable for the following components:      Result Value   Glucose, Bld 308 (*)    Creatinine, Ser 1.29 (*)    All other components within normal limits  CBC WITH DIFFERENTIAL/PLATELET - Abnormal; Notable for the following  components:   RBC 3.27 (*)    Hemoglobin 12.3 (*)    HCT 36.6 (*)    MCV 111.9 (*)    MCH 37.6 (*)    All other components within normal limits  LACTIC ACID, PLASMA  LACTIC ACID, PLASMA    Radiology No results found.  Procedures Procedures  Medications Ordered in ED Medications - No data to display  ED Course  I have reviewed the triage vital signs and the nursing notes.  Pertinent labs & imaging results that were available during my care of the patient were reviewed by me and considered in my medical decision making (see chart for details).  MDM Rules/Calculators/A&P Left gluteal and knee pain, possibly sciatica.  Old records are reviewed, and I see no relevant past visits.  Last imaging of lumbar spine was in 2017 at which point the only significant finding was bones appearing demineralized.  Although there is no history of aneurysm, with his history of hypertension, diabetes, hyperlipidemia, he is at risk for atherosclerotic disease.  Will send for CT angiogram to rule out aortic dissection, also get plain x-rays of left knee.  He feels significantly better after initial dose of morphine.  Labs show mild renal insufficiency which is actually improved over baseline.  There  is also mild anemia which is new, and represents a modest drop in hemoglobin compared with 10 months ago.  CT angiogram is pending.  Case is signed out to Dr. Wyvonnia Dusky.  Final Clinical Impression(s) / ED Diagnoses Final diagnoses:  Acute pain of left knee  Pain in left buttock  Renal insufficiency  Normochromic normocytic anemia    Rx / DC Orders ED Discharge Orders    None       Delora Fuel, MD [id number] [phone]

## 2020-10-30 NOTE — Discharge Instructions (Addendum)
Take the anti-inflammatories and muscle relaxers as prescribed.  Do not take the Robaxin if you are driving as it may cause sleepiness.  Return to the ED with increased pain, weakness, numbness, tingling, bowel or bladder incontinence, fever vomiting or any other concerns.

## 2020-10-30 NOTE — ED Triage Notes (Signed)
Pt c/o back pain that radiates down his left leg that started x 1.5 hours ago. Pt has hx of sciatica.

## 2020-10-30 NOTE — ED Notes (Signed)
ED Provider at bedside. 

## 2020-10-30 NOTE — ED Notes (Signed)
Pt ambulate with some assist around room. Notified MD

## 2020-10-30 NOTE — ED Provider Notes (Signed)
Care assumed from Dr. Roxanne Mins.  Patient with history of diabetes here with left sided buttock pain that radiates down his left leg and left knee.  No direct trauma.  Intact strength and distal pulses.  Dr. Roxanne Mins ordered a CT angiogram to rule out aortic dissection.  On exam patient is very uncomfortable.  Most of his pain is behind his left knee and hamstring area.  He is able to flex and extend the hip and knee with some discomfort.  No evidence of septic joint.  Intact distal pulses and distal strength and sensation.  Low suspicion for cord compression or cauda equina. Reports pain was only mildly improved with morphine.  Will give muscle relaxer as well as obtain venous Doppler given posterior leg pain.  CTA is negative for aortic dissection or other acute pathology. DVT study is negative.   Patient feels improved after morphine and Valium. Suspect symptoms due to sciatica. Discussed anti-inflammatories and muscle relaxers. Patient prefers to avoid steroids due to hyperglycemia risk.  Patient reports his pain is improved and is able to ambulate with assistance.  Reports normally uses a cane which he does not have currently.  Follow-up with PCP.  Return to the ED with increased pain, weakness, numbness, tingling, bowel or bladder incontinence, fever, any other concerns.   Ezequiel Essex, MD 10/30/20 1030

## 2020-10-30 NOTE — Progress Notes (Signed)
Inpatient Diabetes Program Recommendations  AACE/ADA: New Consensus Statement on Inpatient Glycemic Control   Target Ranges:  Prepandial:   less than 140 mg/dL      Peak postprandial:   less than 180 mg/dL (1-2 hours)      Critically ill patients:  140 - 180 mg/dL   Results for Norman Peters, Norman Peters" (MRN [id number]) as of 10/30/2020 08:36  Ref. Range 10/30/2020 05:50  Glucose Latest Ref Range: 70 - 99 mg/dL 308 (H)   Review of Glycemic Control  Diabetes history: DM2 Outpatient Diabetes medications: Lantus 45 units QAM, Metformin 500 mg BID Current orders for Inpatient glycemic control: None  Inpatient Diabetes Program Recommendations:    Insulin: If patient is admitted, please order CBGs AC&HS, Novolog 0-15 units TID with meals, Novolog 0-5 units QHS, and Lantus 15 units daily.  Thanks, Barnie Alderman, RN, MSN, CDE Diabetes Coordinator Inpatient Diabetes Program [phone] (Team Pager from 8am to 5pm)

## 2020-11-04 ENCOUNTER — Encounter: Payer: Self-pay | Admitting: Family Medicine

## 2020-11-04 ENCOUNTER — Other Ambulatory Visit: Payer: Self-pay

## 2020-11-04 ENCOUNTER — Ambulatory Visit (INDEPENDENT_AMBULATORY_CARE_PROVIDER_SITE_OTHER): Payer: Medicare HMO | Admitting: Family Medicine

## 2020-11-04 VITALS — BP 142/82 | HR 72 | Temp 97.8°F | Ht 68.0 in | Wt 216.0 lb

## 2020-11-04 DIAGNOSIS — M5442 Lumbago with sciatica, left side: Secondary | ICD-10-CM

## 2020-11-04 DIAGNOSIS — Z09 Encounter for follow-up examination after completed treatment for conditions other than malignant neoplasm: Secondary | ICD-10-CM | POA: Diagnosis not present

## 2020-11-04 HISTORY — DX: Lumbago with sciatica, left side: M54.42

## 2020-11-04 MED ORDER — METHYLPREDNISOLONE ACETATE 80 MG/ML IJ SUSP
80.0000 mg | Freq: Once | INTRAMUSCULAR | Status: AC
  Administered 2020-11-04: 16:00:00 80 mg via INTRAMUSCULAR

## 2020-11-04 NOTE — Assessment & Plan Note (Signed)
Reviewed notes, labs, and test and medication changes in detail. No labs needed for repeat PT ordered for on going pain and injection of steroid in office. F/U as scheduled

## 2020-11-04 NOTE — Patient Instructions (Signed)
I appreciate the opportunity to provide you with care for your health and wellness. Today we discussed: recent Ed visit   Follow up:  12/17/2020 as scheduled  No labs  Referrals today: PT - for low back and left leg pain   Injection today to help with pain.   Continue medication given in the ED.  Please continue to practice social distancing to keep you, your family, and our community safe.  If you must go out, please wear a mask and practice good handwashing.  It was a pleasure to see you and I look forward to continuing to work together on your health and well-being. Please do not hesitate to call the office if you need care or have questions about your care.  Have a wonderful day. With Gratitude, Tereasa Coop, DNP, AGNP-BC

## 2020-11-04 NOTE — Progress Notes (Signed)
Subjective:  Patient ID: Norman Peters, male    DOB: 1955/11/12  Age: 64 y.o. MRN: [id number]  CC:  Chief Complaint  Patient presents with  . ER follow up    L lower back pain going down L leg      HPI  HPI  Norman Peters is a 64 year old male patient of Dr Simpson's that present today for follow up after being seen in the ED on 10/30/20 for back pain. History is as detailed below. He had awoke the morning he was seen with severe pain in his Left gluteal area and Left knee. He felt they are connected.  He reported his pain to be worse with cold and better with heat.  He denied any weakness, numbness, tingling.  He denied any bowel or bladder dysfunction.  Pain was rated at 10/10.  He had not taken anything for it.  He did admit that he has been doing some work around the house installing a new floor and carrying the materials and also doing some work on his toilet.  He has history of sciatica, but this pain is much worse than any sciatica pain he had in the past.  He had no pain when he went to bed the night prior.   Ct angio was ordered to r/o aortic dissection- it was neg for acute findings. DVT study was neg as well. He was given muscle relaxer as well as morphine which appear to have improved his symptoms, but only mildy prior to Valium addition to this.  Knee xrays neg for acute findings.   He was suspected to have sciatica and advised to do NSAIDS, muslce relaxers and steroid use.  In the ED he declined the steroids, but today in the office he is willing to get the injection. In addition he is willing to have referral to PT   Today patient denies signs and symptoms of COVID 19 infection including fever, chills, cough, shortness of breath, and headache. Past Medical, Surgical, Social History, Allergies, and Medications have been Reviewed.   Past Medical History:  Diagnosis Date  . Anxiety   . Arthritis   . Depression 2001  . Diabetes mellitus 2010  . GERD (gastroesophageal  reflux disease)   . Hyperlipidemia   . Hypertension 2005    Current Meds  Medication Sig  . amLODipine (NORVASC) 10 MG tablet TAKE 1 TABLET EVERY DAY (Patient taking differently: Take 10 mg by mouth daily.)  . aspirin EC 81 MG tablet Take 81 mg by mouth daily.  . busPIRone (BUSPAR) 5 MG tablet TAKE 1 TABLET TWICE DAILY (Patient taking differently: Take 5 mg by mouth 2 (two) times daily.)  . Cholecalciferol (VITAMIN D3) 400 units CAPS Take 2 capsules by mouth daily.  Marland Kitchen FLUoxetine (PROZAC) 40 MG capsule Take 1 capsule (40 mg total) by mouth daily.  . fluticasone (FLONASE) 50 MCG/ACT nasal spray Place 2 sprays into both nostrils daily.  . insulin glargine (LANTUS) 100 UNIT/ML Solostar Pen Inject 45 units under the skin every morning  . Insulin Pen Needle 32G X 4 MM MISC Use daily to inject insulin DX e11.65  . metFORMIN (GLUCOPHAGE) 500 MG tablet TAKE 1 TABLET TWICE DAILY WITH MEALS (Patient taking differently: Take 500 mg by mouth 2 (two) times daily with a meal.)  . methocarbamol (ROBAXIN) 500 MG tablet Take 1 tablet (500 mg total) by mouth every 8 (eight) hours as needed for muscle spasms.  . Multiple Vitamins-Minerals (MULTI-BETIC DIABETES) TABS  Take 1 tablet by mouth daily.  . naproxen (NAPROSYN) 500 MG tablet Take 1 tablet (500 mg total) by mouth 2 (two) times daily as needed.  . rosuvastatin (CRESTOR) 5 MG tablet Take one tablet by mouth every Monday, Wednesday and Friday  . spironolactone (ALDACTONE) 50 MG tablet TAKE 1 TABLET EVERY DAY (Patient taking differently: Take 50 mg by mouth daily.)  . tadalafil (CIALIS) 10 MG tablet Take 1 tablet (10 mg total) by mouth daily as needed for erectile dysfunction.  . TRUE METRIX BLOOD GLUCOSE TEST test strip TEST ONE TIME DAILY  . TRUEplus Lancets 28G MISC USE  TO  TEST ONE TIME DAILY    ROS:  Review of Systems  HENT: Negative.   Eyes: Negative.   Respiratory: Negative.   Cardiovascular: Negative.   Gastrointestinal: Negative.    Genitourinary: Negative.   Musculoskeletal: Positive for back pain and joint pain.  Skin: Negative.   Neurological: Negative.   Endo/Heme/Allergies: Negative.   Psychiatric/Behavioral: Negative.      Objective:   Today's Vitals: BP (!) 142/82 (BP Location: Right Arm, Patient Position: Sitting, Cuff Size: Normal)   Pulse 72   Temp 97.8 F (36.6 C) (Temporal)   Ht 5\' 8"  (1.727 m)   Wt 216 lb (98 kg)   SpO2 98%   BMI 32.84 kg/m  Vitals with BMI 11/04/2020 10/30/2020 10/30/2020  Height 5\' 8"  - -  Weight 216 lbs - -  BMI 32.85 - -  Systolic 142 141 767  Diastolic 82 92 89  Pulse 72 97 99     Physical Exam Vitals and nursing note reviewed.  Constitutional:      Appearance: Normal appearance. He is obese.  HENT:     Head: Normocephalic and atraumatic.     Right Ear: External ear normal.     Left Ear: External ear normal.     Mouth/Throat:     Comments: Mask in place  Eyes:     General:        Right eye: No discharge.        Left eye: No discharge.     Conjunctiva/sclera: Conjunctivae normal.  Cardiovascular:     Rate and Rhythm: Normal rate and regular rhythm.     Pulses: Normal pulses.     Heart sounds: Normal heart sounds.  Pulmonary:     Effort: Pulmonary effort is normal.     Breath sounds: Normal breath sounds.  Musculoskeletal:     Cervical back: Normal range of motion and neck supple.     Comments: Pain over left knee, non specific. Tenderness over left lover back and top of buttock  Skin:    General: Skin is warm.  Neurological:     General: No focal deficit present.     Mental Status: He is alert and oriented to person, place, and time.  Psychiatric:        Mood and Affect: Mood normal.        Behavior: Behavior normal.        Thought Content: Thought content normal.        Judgment: Judgment normal.          Assessment   1. Encounter for examination following treatment at hospital   2. Acute left-sided low back pain with left-sided  sciatica     Tests ordered Orders Placed This Encounter  Procedures  . Ambulatory referral to Physical Therapy     Plan: Please see assessment and plan per problem list above.  Meds ordered this encounter  Medications  . methylPREDNISolone acetate (DEPO-MEDROL) injection 80 mg    Patient to follow-up in as scheduled   Freddy Finner, NP

## 2020-11-04 NOTE — Assessment & Plan Note (Signed)
On going lower back pain. Appears to be chronic perhaps in nature, complains on several joints given him trouble. PT ordered and Injection of steroid in office to help with pain. He does have OA in knees so could be part of the issue, as he might walk differently due to that pain.

## 2020-11-19 ENCOUNTER — Ambulatory Visit (HOSPITAL_COMMUNITY): Payer: Medicare HMO | Attending: Family Medicine

## 2020-11-19 ENCOUNTER — Other Ambulatory Visit: Payer: Self-pay

## 2020-11-19 ENCOUNTER — Encounter (HOSPITAL_COMMUNITY): Payer: Self-pay

## 2020-11-19 DIAGNOSIS — R2681 Unsteadiness on feet: Secondary | ICD-10-CM | POA: Insufficient documentation

## 2020-11-19 DIAGNOSIS — M5442 Lumbago with sciatica, left side: Secondary | ICD-10-CM | POA: Insufficient documentation

## 2020-11-19 DIAGNOSIS — R262 Difficulty in walking, not elsewhere classified: Secondary | ICD-10-CM | POA: Insufficient documentation

## 2020-11-19 NOTE — Therapy (Signed)
[place], [place], [address] Phone: [phone]   Fax:  [phone]  Physical Therapy Evaluation  Patient Details  Name: Norman Peters MRN: [id number] Date of Birth: [birth year] Referring Provider (PT): Cherly Beach, NP   Encounter Date: 11/19/2020   PT End of Session - 11/19/20 0805    Visit Number 1    Number of Visits 12    Date for PT Re-Evaluation 12/31/20    Authorization Type [employer] HMO, auth yes, no V.L.    Progress Note Due on Visit 10    PT Start Time 0810    PT Stop Time [phone]    PT Time Calculation (min) 48 min    Activity Tolerance Patient limited by pain           Past Medical History:  Diagnosis Date  . Anxiety   . Arthritis   . Depression 2001  . Diabetes mellitus 2010  . GERD (gastroesophageal reflux disease)   . Hyperlipidemia   . Hypertension 2005    Past Surgical History:  Procedure Laterality Date  . COLONOSCOPY N/A 07/07/2016   Procedure: COLONOSCOPY;  Surgeon: Rogene Houston, MD;  [facility];  Service: Endoscopy;  Laterality: N/A;  1030  . FINGER SURGERY Left 1974   fifth , trauma on job, had to replace/repair finger  . HERNIA REPAIR  [id number]   x3, umbilical and bilateral hernia    There were no vitals filed for this visit.    Subjective Assessment - 11/19/20 0811    Subjective Patient reports low back and posterior left leg pain since end of December requiring ER visit due to pain and LLE symptoms.  Patient does not indicate any specific injury causing this incident but notes prior to the onset he was performing home renovations and heavy lifting.  Patient reports pain is worse in the AM with LLE pain    How long can you sit comfortably? 10-15 min    How long can you stand comfortably? 20-30 min    How long can you walk comfortably? 20-30 min    Patient Stated Goals Feel better    Currently in Pain? Yes    Pain Score 6     Pain Location Back    Pain  Orientation Left    Pain Descriptors / Indicators Burning;Aching    Pain Type Acute pain    Pain Radiating Towards LLE    Pain Onset 1 to 4 weeks ago    Pain Frequency Intermittent    Aggravating Factors  pt reports after being still for 10-15 miutes pain with transitional movements    Pain Relieving Factors better with getting up and moving around              [facility] PT Assessment - 11/19/20 0001      Assessment   Medical Diagnosis Acute left-sided low back pain with left-sided sciatica    Referring Provider (PT) Cherly Beach, NP    Onset Date/Surgical Date 10/30/20      Balance Screen   Has the patient fallen in the past 6 months No    Has the patient had a decrease in activity level because of a fear of falling?  No    Is the patient reluctant to leave their home because of a fear of falling?  No      Prior Function   Level of Independence Independent    Vocation Retired;On disability  [employer]      Observation/Other Assessments   Focus on Therapeutic Outcomes (FOTO)  63% function      Sensation   Light Touch Appears Intact      ROM / Strength   AROM / PROM / Strength Strength;AROM      AROM   AROM Assessment Site Lumbar;Hip    Lumbar Flexion WNL, painful    Lumbar Extension guarded    Lumbar - Right Side Bend WNL    Lumbar - Left Side Bend WNL    Lumbar - Right Rotation guarded, painful    Lumbar - Left Rotation WNL      Strength   Strength Assessment Site Hip;Knee;Lumbar    Lumbar Flexion 3+/5    Lumbar Extension 3-/5      Palpation   Palpation comment Left QL TTP      Special Tests    Special Tests Lumbar    Lumbar Tests Straight Leg Raise;Prone Knee Bend Test      Prone Knee Bend Test   Findings Positive    Side Left      Straight Leg Raise   Findings Negative    Side  Left      Transfers   Five time sit to stand comments  25 sec      Ambulation/Gait   Ambulation/Gait Yes    Ambulation/Gait Assistance 7: Independent     Ambulation Distance (Feet) 200 Feet    Gait Pattern --   episodes of unsteadiness with left knee buckling observed   Gait Comments 2MWT                      Objective measurements completed on examination: See above findings.               PT Education - 11/19/20 0840    Education Details Patient education on exam findings and postural support for lumbar extension. Education on lumbar spine anatomy and monitoring for centralization of symptoms vs peripheralization    Person(s) Educated Patient    Methods Explanation;Demonstration    Comprehension Verbalized understanding;Need further instruction            PT Short Term Goals - 11/19/20 0853      PT SHORT TERM GOAL #1   Title Patient will be independent with HEP in order to improve functional outcomes.    Time 3    Period Weeks    Status New    Target Date 12/10/20      PT SHORT TERM GOAL #2   Title Patient will report at least 25% improvement in symptoms for improved quality of life.    Time 3    Period Weeks    Status New    Target Date 12/10/20      PT SHORT TERM GOAL #3   Title Patient will be able to complete 5x STS in under 11.4 seconds in order to reduce the risk of falls.    Baseline 25 sec    Time 3    Period Weeks    Status New    Target Date 12/10/20             PT Long Term Goals - 11/19/20 0909      PT LONG TERM GOAL #1   Title Patient will report lumbar/LLE pain not exceeding 3/10 with activity    Baseline 6/10    Time 6    Period Weeks    Status New  Target Date 12/31/20      PT LONG TERM GOAL #2   Title Patient will improve on FOTO score to meet predicted outcomes to improve functional independence    Baseline 63% function    Time 6    Period Weeks    Status New    Target Date 12/31/20      PT LONG TERM GOAL #3   Title Patient will ambulate x 350 ft during 2MWT without instance of LLE pain/buckling    Baseline 200 ft with unsteadiness    Time 6    Period  Weeks    Status New    Target Date 12/31/20                  Plan - 11/19/20 0843    Clinical Impression Statement Patient demonstrates increased lumbar and LLE pain which seems to be exacerbated with flexion-biased movements and ameliorated with extension and movement.  Due to deficits and limitations patient exhibits difficulty in walking, unsteadiness on feet, reduced activity tolerance, and core/trunk weakness.  Patient would benefit from skilled PT Services to centralize pain and increase core strength/stability to improve mobility and train in proper body mechanics to reduce risk for re-injury and further debility.    Personal Factors and Comorbidities Age;Fitness;Time since onset of injury/illness/exacerbation;Comorbidity 1    Comorbidities PMH    Examination-Activity Limitations Lift;Stairs;Squat;Sleep;Locomotion Level;Carry;Sit;Transfers    Examination-Participation Restrictions Cleaning;Community Activity;Laundry    Stability/Clinical Decision Making Stable/Uncomplicated    Clinical Decision Making Low    Rehab Potential Good    PT Frequency 2x / week    PT Duration 6 weeks    PT Treatment/Interventions ADLs/Self Care Home Management;Aquatic Therapy;DME Instruction;Ultrasound;Traction;Moist Heat;Gait training;Stair training;Functional mobility training;Therapeutic activities;Therapeutic exercise;Balance training;Patient/family education;Neuromuscular re-education;Manual techniques;Passive range of motion;Taping;Energy conservation;Dry needling;Spinal Manipulations;Joint Manipulations    PT Next Visit Plan assess for extension bias improvement vs flexion bias    PT Home Exercise Plan prone with support, sitting with lumbar support, LTR           Patient will benefit from skilled therapeutic intervention in order to improve the following deficits and impairments:  Abnormal gait,Decreased activity tolerance,Decreased balance,Decreased mobility,Decreased knowledge of use of  DME,Decreased endurance,Decreased range of motion,Decreased strength,Difficulty walking,Increased muscle spasms,Impaired flexibility,Improper body mechanics,Postural dysfunction,Obesity,Pain  Visit Diagnosis: Acute left-sided low back pain with left-sided sciatica  Difficulty in walking, not elsewhere classified  Unsteadiness on feet     Problem List Patient Active Problem List   Diagnosis Date Noted  . Acute left-sided low back pain with left-sided sciatica 11/04/2020  . Encounter for examination following treatment at hospital 11/04/2020  . Seasonal allergies 09/12/2020  . Nasal congestion 09/12/2020  . Osteoarthritis of right knee 09/02/2020  . Type 2 diabetes mellitus with stage 3a chronic kidney disease, with long-term current use of insulin ([place]) 02/05/2020  . Vitamin D deficiency 01/11/2020  . Obesity (BMI 30.0-34.9) 09/03/2019  . Dental caries 09/03/2019  . Chronic elbow pain, left 06/23/2019  . Type 2 diabetes mellitus with hyperglycemia ([place]) 05/03/2019  . Chest pain with moderate risk of acute coronary syndrome 09/03/2018  . Mixed hyperlipidemia 05/29/2017  . Spasm of thoracic back muscle 10/06/2016  . Pain in joint involving multiple sites 02/12/2016  . GERD (gastroesophageal reflux disease) 02/12/2016  . Essential hypertension, benign 02/10/2016  . Anxiety and depression 02/10/2016    9:16 AM, 11/19/20 M. Sherlyn Lees, PT, DPT Physical Therapist- [place] Office Number: [facility] [place]  [place], [place], [address] Phone: [phone]   Fax:  [phone]  Name: Randal Wiesehan MRN: [id number] Date of Birth: Dec 09, 1955

## 2020-11-19 NOTE — Patient Instructions (Signed)
Access Code: [email or web address] URL: [email or web address] Date: 11/19/2020 Prepared by: Sherlyn Lees  Exercises Lying Prone - 1 x daily - 7 x weekly Standing Lumbar Extension with Counter - 1 x daily - 7 x weekly

## 2020-11-21 ENCOUNTER — Encounter (HOSPITAL_COMMUNITY): Payer: Self-pay

## 2020-11-21 ENCOUNTER — Telehealth (HOSPITAL_COMMUNITY): Payer: Self-pay | Admitting: Physical Therapy

## 2020-11-21 ENCOUNTER — Other Ambulatory Visit: Payer: Self-pay

## 2020-11-21 ENCOUNTER — Ambulatory Visit (HOSPITAL_COMMUNITY): Payer: Medicare HMO

## 2020-11-21 DIAGNOSIS — R262 Difficulty in walking, not elsewhere classified: Secondary | ICD-10-CM | POA: Diagnosis not present

## 2020-11-21 DIAGNOSIS — M5442 Lumbago with sciatica, left side: Secondary | ICD-10-CM | POA: Diagnosis not present

## 2020-11-21 DIAGNOSIS — R2681 Unsteadiness on feet: Secondary | ICD-10-CM | POA: Diagnosis not present

## 2020-11-21 NOTE — Telephone Encounter (Signed)
S/w pt  to call our voicemail if you have any concerns if  the office is open or closed on 11/24/20 due to weather

## 2020-11-21 NOTE — Therapy (Signed)
[place], [place], [address] Phone: [phone]   Fax:  [phone]  Physical Therapy Treatment  Patient Details  Name: Norman Peters MRN: [id number] Date of Birth: 11/08/1956 Referring Provider (PT): Cherly Beach, NP   Encounter Date: 11/21/2020   PT End of Session - 11/21/20 1136    Visit Number 2    Number of Visits 12    Date for PT Re-Evaluation 12/31/20    Authorization Type [employer] HMO, auth yes, no V.L.    Progress Note Due on Visit 10    PT Start Time 1115    PT Stop Time 1155    PT Time Calculation (min) 40 min    Activity Tolerance Patient tolerated treatment well           Past Medical History:  Diagnosis Date  . Anxiety   . Arthritis   . Depression 2001  . Diabetes mellitus 2010  . GERD (gastroesophageal reflux disease)   . Hyperlipidemia   . Hypertension 2005    Past Surgical History:  Procedure Laterality Date  . COLONOSCOPY N/A 07/07/2016   Procedure: COLONOSCOPY;  Surgeon: Rogene Houston, MD;  [facility];  Service: Endoscopy;  Laterality: N/A;  1030  . FINGER SURGERY Left 1974   fifth , trauma on job, had to replace/repair finger  . HERNIA REPAIR  [id number]   x3, umbilical and bilateral hernia    There were no vitals filed for this visit.   Subjective Assessment - 11/21/20 1118    Subjective Patient reports the extension-biased movements have made his back tender and reports his left leg is about the same with pain and awoke him this AM from pain.    How long can you sit comfortably? 10-15 min    How long can you stand comfortably? 20-30 min    How long can you walk comfortably? 20-30 min    Patient Stated Goals Feel better    Pain Onset 1 to 4 weeks ago                             [facility] Adult PT Treatment/Exercise - 11/21/20 0001      Ambulation/Gait   Ambulation/Gait Yes    Ambulation/Gait Assistance 6: Modified independent  (Device/Increase time)    Ambulation Distance (Feet) 300 Feet    Assistive device Straight cane    Gait Comments reduced LLE symptoms preceeded by extension-biased movements      Exercises   Exercises Lumbar      Lumbar Exercises: Stretches   Lower Trunk Rotation 2 reps;60 seconds    Prone on Elbows Stretch 2 reps;60 seconds    Piriformis Stretch Left;3 reps;30 seconds   performed in supine, stage 1 using hands for pressure   Other Lumbar Stretch Exercise prone x 5 min    Other Lumbar Stretch Exercise seated piriformis stretch 3x30 sec                  PT Education - 11/21/20 1155    Education Details Patient education on symptom monitoring and understanding peripheralization vs centralization and role of body mechanics and positioning impart    Person(s) Educated Patient    Methods Explanation;Demonstration    Comprehension Verbalized understanding;Returned demonstration            PT Short Term Goals - 11/19/20 0853      PT SHORT TERM GOAL #1  Title Patient will be independent with HEP in order to improve functional outcomes.    Time 3    Period Weeks    Status New    Target Date 12/10/20      PT SHORT TERM GOAL #2   Title Patient will report at least 25% improvement in symptoms for improved quality of life.    Time 3    Period Weeks    Status New    Target Date 12/10/20      PT SHORT TERM GOAL #3   Title Patient will be able to complete 5x STS in under 11.4 seconds in order to reduce the risk of falls.    Baseline 25 sec    Time 3    Period Weeks    Status New    Target Date 12/10/20             PT Long Term Goals - 11/19/20 0909      PT LONG TERM GOAL #1   Title Patient will report lumbar/LLE pain not exceeding 3/10 with activity    Baseline 6/10    Time 6    Period Weeks    Status New    Target Date 12/31/20      PT LONG TERM GOAL #2   Title Patient will improve on FOTO score to meet predicted outcomes to improve functional independence     Baseline 63% function    Time 6    Period Weeks    Status New    Target Date 12/31/20      PT LONG TERM GOAL #3   Title Patient will ambulate x 350 ft during 2MWT without instance of LLE pain/buckling    Baseline 200 ft with unsteadiness    Time 6    Period Weeks    Status New    Target Date 12/31/20                 Plan - 11/21/20 1158    Clinical Impression Statement Patient progressed through prone to prone elbows and extension-biased movement plan followed by course of ambulation to assess.  Patient reports centralized LLE symptoms with extension-bias and able to ambulate with increased comfort per his report.  Patient would benefit from continued tx sessions to centralize pain and develop comprehensive self-management program    Personal Factors and Comorbidities Age;Fitness;Time since onset of injury/illness/exacerbation;Comorbidity 1    Comorbidities PMH    Examination-Activity Limitations Lift;Stairs;Squat;Sleep;Locomotion Level;Carry;Sit;Transfers    Examination-Participation Restrictions Cleaning;Community Activity;Laundry    Stability/Clinical Decision Making Stable/Uncomplicated    Rehab Potential Good    PT Frequency 2x / week    PT Duration 6 weeks    PT Treatment/Interventions ADLs/Self Care Home Management;Aquatic Therapy;DME Instruction;Ultrasound;Traction;Moist Heat;Gait training;Stair training;Functional mobility training;Therapeutic activities;Therapeutic exercise;Balance training;Patient/family education;Neuromuscular re-education;Manual techniques;Passive range of motion;Taping;Energy conservation;Dry needling;Spinal Manipulations;Joint Manipulations    PT Next Visit Plan assess for extension bias improvement vs flexion bias    PT Home Exercise Plan prone with support, sitting with lumbar support, LTR           Patient will benefit from skilled therapeutic intervention in order to improve the following deficits and impairments:  Abnormal  gait,Decreased activity tolerance,Decreased balance,Decreased mobility,Decreased knowledge of use of DME,Decreased endurance,Decreased range of motion,Decreased strength,Difficulty walking,Increased muscle spasms,Impaired flexibility,Improper body mechanics,Postural dysfunction,Obesity,Pain  Visit Diagnosis: Acute left-sided low back pain with left-sided sciatica  Difficulty in walking, not elsewhere classified  Unsteadiness on feet     Problem List Patient Active Problem List  Diagnosis Date Noted  . Acute left-sided low back pain with left-sided sciatica 11/04/2020  . Encounter for examination following treatment at hospital 11/04/2020  . Seasonal allergies 09/12/2020  . Nasal congestion 09/12/2020  . Osteoarthritis of right knee 09/02/2020  . Type 2 diabetes mellitus with stage 3a chronic kidney disease, with long-term current use of insulin ([place]) 02/05/2020  . Vitamin D deficiency 01/11/2020  . Obesity (BMI 30.0-34.9) 09/03/2019  . Dental caries 09/03/2019  . Chronic elbow pain, left 06/23/2019  . Type 2 diabetes mellitus with hyperglycemia ([place]) 05/03/2019  . Chest pain with moderate risk of acute coronary syndrome 09/03/2018  . Mixed hyperlipidemia 05/29/2017  . Spasm of thoracic back muscle 10/06/2016  . Pain in joint involving multiple sites 02/12/2016  . GERD (gastroesophageal reflux disease) 02/12/2016  . Essential hypertension, benign 02/10/2016  . Anxiety and depression 02/10/2016     12:01 PM, 11/21/20 M. Sherlyn Lees, PT, DPT Physical Therapist- [place] Office Number: [phone]  [facility] [address] [place], [place], [address] Phone: [phone]   Fax:  [phone]  Name: Norman Peters MRN: [id number] Date of Birth: 1956/09/05

## 2020-11-21 NOTE — Patient Instructions (Signed)
Access Code: [email or web address] URL: [email or web address] Date: 11/21/2020 Prepared by: Sherlyn Lees  Exercises Supine Figure 4 Piriformis Stretch - 1 x daily - 7 x weekly - 3 sets - 3 reps - 30 sec hold Seated Piriformis Stretch - 1 x daily - 7 x weekly - 3 sets - 3 reps - 30 sec hold Seated Piriformis Stretch - 1 x daily - 7 x weekly - 3 sets - 3 reps - 30 sec hold

## 2020-11-24 ENCOUNTER — Ambulatory Visit (HOSPITAL_COMMUNITY): Payer: Medicare HMO | Admitting: Physical Therapy

## 2020-11-26 ENCOUNTER — Ambulatory Visit (HOSPITAL_COMMUNITY): Payer: Medicare HMO | Admitting: Physical Therapy

## 2020-11-26 ENCOUNTER — Encounter (HOSPITAL_COMMUNITY): Payer: Self-pay | Admitting: Physical Therapy

## 2020-11-26 ENCOUNTER — Other Ambulatory Visit: Payer: Self-pay

## 2020-11-26 DIAGNOSIS — M5442 Lumbago with sciatica, left side: Secondary | ICD-10-CM | POA: Diagnosis not present

## 2020-11-26 DIAGNOSIS — R2681 Unsteadiness on feet: Secondary | ICD-10-CM

## 2020-11-26 DIAGNOSIS — R262 Difficulty in walking, not elsewhere classified: Secondary | ICD-10-CM

## 2020-11-26 NOTE — Therapy (Signed)
[place], [place], [address] Phone: [phone]   Fax:  [phone]  Physical Therapy Treatment  Patient Details  Name: Norman Peters MRN: [id number] Date of Birth: 04-24-1956 Referring Provider (PT): Cherly Beach, NP   Encounter Date: 11/26/2020   PT End of Session - 11/26/20 0842    Visit Number 3    Number of Visits 12    Date for PT Re-Evaluation 12/31/20    Authorization Type [employer] HMO, auth yes, no V.L.    Progress Note Due on Visit 10    PT Start Time [phone]   arrives late   PT Stop Time 0915    PT Time Calculation (min) 33 min    Activity Tolerance Patient tolerated treatment well           Past Medical History:  Diagnosis Date  . Anxiety   . Arthritis   . Depression 2001  . Diabetes mellitus 2010  . GERD (gastroesophageal reflux disease)   . Hyperlipidemia   . Hypertension 2005    Past Surgical History:  Procedure Laterality Date  . COLONOSCOPY N/A 07/07/2016   Procedure: COLONOSCOPY;  Surgeon: Rogene Houston, MD;  [facility];  Service: Endoscopy;  Laterality: N/A;  1030  . FINGER SURGERY Left 1974   fifth , trauma on job, had to replace/repair finger  . HERNIA REPAIR  1884   x3, umbilical and bilateral hernia    There were no vitals filed for this visit.   Subjective Assessment - 11/26/20 0844    Subjective Patient states he fell last night when he slipped on the ice. His back is feeling about the same. Symptoms are less going into left leg. He sometimes gets pain into his leg. His home exercises are going well.    How long can you sit comfortably? 10-15 min    How long can you stand comfortably? 20-30 min    How long can you walk comfortably? 20-30 min    Patient Stated Goals Feel better    Currently in Pain? Yes    Pain Score 5     Pain Location Back    Pain Orientation Left    Pain Descriptors / Indicators Pounding;Tender    Pain Type Acute pain    Pain  Onset 1 to 4 weeks ago                             [facility] Adult PT Treatment/Exercise - 11/26/20 0001      Lumbar Exercises: Stretches   Prone on Elbows Stretch 2 reps;60 seconds    Press Ups 2 reps;10 reps      Lumbar Exercises: Standing   Other Standing Lumbar Exercises palof press 2x10 bilateral Riling band      Lumbar Exercises: Prone   Straight Leg Raise 10 reps    Straight Leg Raises Limitations 2 sets                  PT Education - 11/26/20 0843    Education Details Patient educated on HEP, exercise mechanics, symptom monitoring,centralization of symptoms, muscle soreness    Person(s) Educated Patient    Methods Explanation;Demonstration;Handout    Comprehension Verbalized understanding;Returned demonstration            PT Short Term Goals - 11/19/20 0853      PT SHORT TERM GOAL #1   Title Patient will be  independent with HEP in order to improve functional outcomes.    Time 3    Period Weeks    Status New    Target Date 12/10/20      PT SHORT TERM GOAL #2   Title Patient will report at least 25% improvement in symptoms for improved quality of life.    Time 3    Period Weeks    Status New    Target Date 12/10/20      PT SHORT TERM GOAL #3   Title Patient will be able to complete 5x STS in under 11.4 seconds in order to reduce the risk of falls.    Baseline 25 sec    Time 3    Period Weeks    Status New    Target Date 12/10/20             PT Long Term Goals - 11/19/20 0909      PT LONG TERM GOAL #1   Title Patient will report lumbar/LLE pain not exceeding 3/10 with activity    Baseline 6/10    Time 6    Period Weeks    Status New    Target Date 12/31/20      PT LONG TERM GOAL #2   Title Patient will improve on FOTO score to meet predicted outcomes to improve functional independence    Baseline 63% function    Time 6    Period Weeks    Status New    Target Date 12/31/20      PT LONG TERM GOAL #3   Title Patient  will ambulate x 350 ft during 2MWT without instance of LLE pain/buckling    Baseline 200 ft with unsteadiness    Time 6    Period Weeks    Status New    Target Date 12/31/20                 Plan - 11/26/20 0843    Clinical Impression Statement Patient ambulates into clinic with crouched posture initially showing shortened hip flexors. Patient symptoms appear to be centralizing with extension based exercises with report of no LLE pain over last day or so and only c/o low back pain today. Patient given cueing for decreased lumbar extensor and glute activation with press up with good carry over and is able to complete with good mechanics. Patient states low back tenderness following press up exercise and no change in low back symptoms despite ambulating with improved posture. Patient requires cueing for limiting excessive lumbar and knee ROM with prone glute strengthening exercise. Patient will continue to benefit from skilled physical therapy in order to reduce impairment and improve function.    Personal Factors and Comorbidities Age;Fitness;Time since onset of injury/illness/exacerbation;Comorbidity 1    Comorbidities PMH    Examination-Activity Limitations Lift;Stairs;Squat;Sleep;Locomotion Level;Carry;Sit;Transfers    Examination-Participation Restrictions Cleaning;Community Activity;Laundry    Stability/Clinical Decision Making Stable/Uncomplicated    Rehab Potential Good    PT Frequency 2x / week    PT Duration 6 weeks    PT Treatment/Interventions ADLs/Self Care Home Management;Aquatic Therapy;DME Instruction;Ultrasound;Traction;Moist Heat;Gait training;Stair training;Functional mobility training;Therapeutic activities;Therapeutic exercise;Balance training;Patient/family education;Neuromuscular re-education;Manual techniques;Passive range of motion;Taping;Energy conservation;Dry needling;Spinal Manipulations;Joint Manipulations    PT Next Visit Plan continue to assess for extension  bias improvement vs flexion bias; continue core strengthing and progress as able    PT Home Exercise Plan prone with support, sitting with lumbar support, LTR 11/26/20 press up, hip extension in prone  Patient will benefit from skilled therapeutic intervention in order to improve the following deficits and impairments:  Abnormal gait,Decreased activity tolerance,Decreased balance,Decreased mobility,Decreased knowledge of use of DME,Decreased endurance,Decreased range of motion,Decreased strength,Difficulty walking,Increased muscle spasms,Impaired flexibility,Improper body mechanics,Postural dysfunction,Obesity,Pain  Visit Diagnosis: Acute left-sided low back pain with left-sided sciatica  Difficulty in walking, not elsewhere classified  Unsteadiness on feet     Problem List Patient Active Problem List   Diagnosis Date Noted  . Acute left-sided low back pain with left-sided sciatica 11/04/2020  . Encounter for examination following treatment at hospital 11/04/2020  . Seasonal allergies 09/12/2020  . Nasal congestion 09/12/2020  . Osteoarthritis of right knee 09/02/2020  . Type 2 diabetes mellitus with stage 3a chronic kidney disease, with long-term current use of insulin ([place]) 02/05/2020  . Vitamin D deficiency 01/11/2020  . Obesity (BMI 30.0-34.9) 09/03/2019  . Dental caries 09/03/2019  . Chronic elbow pain, left 06/23/2019  . Type 2 diabetes mellitus with hyperglycemia ([place]) 05/03/2019  . Chest pain with moderate risk of acute coronary syndrome 09/03/2018  . Mixed hyperlipidemia 05/29/2017  . Spasm of thoracic back muscle 10/06/2016  . Pain in joint involving multiple sites 02/12/2016  . GERD (gastroesophageal reflux disease) 02/12/2016  . Essential hypertension, benign 02/10/2016  . Anxiety and depression 02/10/2016    9:21 AM, 11/26/20 Mearl Latin PT, DPT Physical Therapist at [facility] [place], [place], [address] Phone: [phone]   Fax:  [phone]  Name: Norman Peters MRN: [id number] Date of Birth: 06-08-1956

## 2020-11-26 NOTE — Patient Instructions (Signed)
Access Code: [email or web address] URL: [email or web address] Date: 11/26/2020 Prepared by: [facility] Breylen Agyeman  Exercises [employer] Up - 3 x daily - 7 x weekly - 2 sets - 10 reps Prone Hip Extension - 1 x daily - 7 x weekly - 1-2 sets - 10 reps

## 2020-12-01 ENCOUNTER — Other Ambulatory Visit: Payer: Self-pay

## 2020-12-01 ENCOUNTER — Ambulatory Visit (HOSPITAL_COMMUNITY): Payer: Medicare HMO | Admitting: Physical Therapy

## 2020-12-01 ENCOUNTER — Encounter (HOSPITAL_COMMUNITY): Payer: Medicare HMO

## 2020-12-01 ENCOUNTER — Encounter (HOSPITAL_COMMUNITY): Payer: Self-pay | Admitting: Physical Therapy

## 2020-12-01 DIAGNOSIS — M5442 Lumbago with sciatica, left side: Secondary | ICD-10-CM

## 2020-12-01 DIAGNOSIS — R262 Difficulty in walking, not elsewhere classified: Secondary | ICD-10-CM | POA: Diagnosis not present

## 2020-12-01 DIAGNOSIS — R2681 Unsteadiness on feet: Secondary | ICD-10-CM

## 2020-12-01 NOTE — Patient Instructions (Signed)
Access Code: [email or web address] URL: [email or web address] Date: 12/01/2020 Prepared by: Josue Hector  Exercises Supine Transversus Abdominis Bracing - Hands on Stomach - 2 x daily - 7 x weekly - 2 sets - 10 reps - 5 second hold Supine Bridge - 2 x daily - 7 x weekly - 2 sets - 10 reps - second hold Supine Piriformis Stretch with Foot on Ground - 2 x daily - 7 x weekly - 1 sets - 3 reps - 30 second hold

## 2020-12-01 NOTE — Therapy (Signed)
[place], [place], [address] Phone: [phone]   Fax:  [phone]  Physical Therapy Treatment  Patient Details  Name: Norman Peters MRN: [id number] Date of Birth: 08/05/1956 Referring Provider (PT): Cherly Beach, NP   Encounter Date: 12/01/2020   PT End of Session - 12/01/20 0824    Visit Number 4    Number of Visits 12    Date for PT Re-Evaluation 12/31/20    Authorization Type [employer] HMO, auth yes, no V.L.    Progress Note Due on Visit 10    PT Start Time 0820    PT Stop Time 0900    PT Time Calculation (min) 40 min    Activity Tolerance Patient tolerated treatment well           Past Medical History:  Diagnosis Date  . Anxiety   . Arthritis   . Depression 2001  . Diabetes mellitus 2010  . GERD (gastroesophageal reflux disease)   . Hyperlipidemia   . Hypertension 2005    Past Surgical History:  Procedure Laterality Date  . COLONOSCOPY N/A 07/07/2016   Procedure: COLONOSCOPY;  Surgeon: Rogene Houston, MD;  [facility];  Service: Endoscopy;  Laterality: N/A;  1030  . FINGER SURGERY Left 1974   fifth , trauma on job, had to replace/repair finger  . HERNIA REPAIR  7026   x3, umbilical and bilateral hernia    There were no vitals filed for this visit.   Subjective Assessment - 12/01/20 0823    Subjective Patient says he is doing well. Exercises are good. Nothing new.    How long can you sit comfortably? 10-15 min    How long can you stand comfortably? 20-30 min    How long can you walk comfortably? 20-30 min    Patient Stated Goals Feel better    Currently in Pain? No/denies    Pain Onset 1 to 4 weeks ago                             [facility] Adult PT Treatment/Exercise - 12/01/20 0001      Lumbar Exercises: Stretches   Prone on Elbows Stretch 2 reps;60 seconds    Press Ups 1 rep;10 reps    Piriformis Stretch Right;Left;2 reps;30 seconds      Lumbar  Exercises: Standing   Other Standing Lumbar Exercises pallof press 2x10 bilateral Arman band      Lumbar Exercises: Supine   Ab Set 10 reps;5 seconds    Bridge 10 reps                    PT Short Term Goals - 11/19/20 3785      PT SHORT TERM GOAL #1   Title Patient will be independent with HEP in order to improve functional outcomes.    Time 3    Period Weeks    Status New    Target Date 12/10/20      PT SHORT TERM GOAL #2   Title Patient will report at least 25% improvement in symptoms for improved quality of life.    Time 3    Period Weeks    Status New    Target Date 12/10/20      PT SHORT TERM GOAL #3   Title Patient will be able to complete 5x STS in under 11.4 seconds in order to reduce the risk  of falls.    Baseline 25 sec    Time 3    Period Weeks    Status New    Target Date 12/10/20             PT Long Term Goals - 11/19/20 0909      PT LONG TERM GOAL #1   Title Patient will report lumbar/LLE pain not exceeding 3/10 with activity    Baseline 6/10    Time 6    Period Weeks    Status New    Target Date 12/31/20      PT LONG TERM GOAL #2   Title Patient will improve on FOTO score to meet predicted outcomes to improve functional independence    Baseline 63% function    Time 6    Period Weeks    Status New    Target Date 12/31/20      PT LONG TERM GOAL #3   Title Patient will ambulate x 350 ft during 2MWT without instance of LLE pain/buckling    Baseline 200 ft with unsteadiness    Time 6    Period Weeks    Status New    Target Date 12/31/20                 Plan - 12/01/20 0856    Clinical Impression Statement Patient tolerated session well today with no increased complaints of pain. Progressed core and glute strengthening with added ab brace and ab march as well as hip bridging. Patient required verbal cues for proper form and mechanics with added exercise. Patient educated on and issued updated HEP handout. Patient will  continue to benefit from skilled therapy services to address ongoing deficits for reduced pain and improved LOF with ADLs.    Personal Factors and Comorbidities Age;Fitness;Time since onset of injury/illness/exacerbation;Comorbidity 1    Comorbidities PMH    Examination-Activity Limitations Lift;Stairs;Squat;Sleep;Locomotion Level;Carry;Sit;Transfers    Examination-Participation Restrictions Cleaning;Community Activity;Laundry    Stability/Clinical Decision Making Stable/Uncomplicated    Rehab Potential Good    PT Frequency 2x / week    PT Duration 6 weeks    PT Treatment/Interventions ADLs/Self Care Home Management;Aquatic Therapy;DME Instruction;Ultrasound;Traction;Moist Heat;Gait training;Stair training;Functional mobility training;Therapeutic activities;Therapeutic exercise;Balance training;Patient/family education;Neuromuscular re-education;Manual techniques;Passive range of motion;Taping;Energy conservation;Dry needling;Spinal Manipulations;Joint Manipulations    PT Next Visit Plan continue with extension based exercise, progress hip and core strengthening as tolerated    PT Home Exercise Plan prone with support, sitting with lumbar support, LTR 11/26/20 press up, hip extension in prone 1/24 bridge, ab set, piriformis stretch    Consulted and Agree with Plan of Care Patient           Patient will benefit from skilled therapeutic intervention in order to improve the following deficits and impairments:  Abnormal gait,Decreased activity tolerance,Decreased balance,Decreased mobility,Decreased knowledge of use of DME,Decreased endurance,Decreased range of motion,Decreased strength,Difficulty walking,Increased muscle spasms,Impaired flexibility,Improper body mechanics,Postural dysfunction,Obesity,Pain  Visit Diagnosis: Acute left-sided low back pain with left-sided sciatica  Difficulty in walking, not elsewhere classified  Unsteadiness on feet     Problem List Patient Active Problem  List   Diagnosis Date Noted  . Acute left-sided low back pain with left-sided sciatica 11/04/2020  . Encounter for examination following treatment at hospital 11/04/2020  . Seasonal allergies 09/12/2020  . Nasal congestion 09/12/2020  . Osteoarthritis of right knee 09/02/2020  . Type 2 diabetes mellitus with stage 3a chronic kidney disease, with long-term current use of insulin ([place]) 02/05/2020  . Vitamin D deficiency  01/11/2020  . Obesity (BMI 30.0-34.9) 09/03/2019  . Dental caries 09/03/2019  . Chronic elbow pain, left 06/23/2019  . Type 2 diabetes mellitus with hyperglycemia ([place]) 05/03/2019  . Chest pain with moderate risk of acute coronary syndrome 09/03/2018  . Mixed hyperlipidemia 05/29/2017  . Spasm of thoracic back muscle 10/06/2016  . Pain in joint involving multiple sites 02/12/2016  . GERD (gastroesophageal reflux disease) 02/12/2016  . Essential hypertension, benign 02/10/2016  . Anxiety and depression 02/10/2016   8:59 AM, 12/01/20 Josue Hector PT DPT  Physical Therapist with [facility]  [phone] [place] [address] [place], [place], [address] Phone: [phone]   Fax:  [phone]  Name: Feliks Lemieux MRN: [id number] Date of Birth: 08-Mar-1956

## 2020-12-02 ENCOUNTER — Encounter (HOSPITAL_COMMUNITY): Payer: Medicare HMO | Admitting: Physical Therapy

## 2020-12-03 ENCOUNTER — Ambulatory Visit (HOSPITAL_COMMUNITY): Payer: Medicare HMO | Admitting: Physical Therapy

## 2020-12-03 ENCOUNTER — Other Ambulatory Visit: Payer: Self-pay

## 2020-12-03 ENCOUNTER — Encounter (HOSPITAL_COMMUNITY): Payer: Self-pay | Admitting: Physical Therapy

## 2020-12-03 DIAGNOSIS — R262 Difficulty in walking, not elsewhere classified: Secondary | ICD-10-CM | POA: Diagnosis not present

## 2020-12-03 DIAGNOSIS — M5442 Lumbago with sciatica, left side: Secondary | ICD-10-CM | POA: Diagnosis not present

## 2020-12-03 DIAGNOSIS — R2681 Unsteadiness on feet: Secondary | ICD-10-CM | POA: Diagnosis not present

## 2020-12-03 NOTE — Therapy (Signed)
[facility] [address] [place], [place], [address] Phone: [phone]   Fax:  [phone]  Physical Therapy Treatment/Discharge Summary  Patient Details  Name: Norman Peters MRN: [id number] Date of Birth: 1956/02/22 Referring Provider (PT): Cherly Beach, NP   Encounter Date: 12/03/2020   PHYSICAL THERAPY DISCHARGE SUMMARY  Visits from Start of Care: 5  Current functional level related to goals / functional outcomes: See below   Remaining deficits: See below   Education / Equipment: See below  Plan: Patient agrees to discharge.  Patient goals were met. Patient is being discharged due to meeting the stated rehab goals.  ?????        PT End of Session - 12/03/20 0816    Visit Number 5    Number of Visits 12    Date for PT Re-Evaluation 12/31/20    Authorization Type [employer] HMO, auth yes, no V.L.    Progress Note Due on Visit 10    PT Start Time 0818    PT Stop Time 0845    PT Time Calculation (min) 27 min    Activity Tolerance Patient tolerated treatment well    Behavior During Therapy WFL for tasks assessed/performed           Past Medical History:  Diagnosis Date  . Anxiety   . Arthritis   . Depression 2001  . Diabetes mellitus 2010  . GERD (gastroesophageal reflux disease)   . Hyperlipidemia   . Hypertension 2005    Past Surgical History:  Procedure Laterality Date  . COLONOSCOPY N/A 07/07/2016   Procedure: COLONOSCOPY;  Surgeon: Rogene Houston, MD;  Location: [facility];  Service: Endoscopy;  Laterality: N/A;  1030  . FINGER SURGERY Left 1974   fifth , trauma on job, had to replace/repair finger  . HERNIA REPAIR  6803   x3, umbilical and bilateral hernia    There were no vitals filed for this visit.   Subjective Assessment - 12/03/20 0817    Subjective Patient states his back is feeling good and he is not having any issues. Patient states about 80% improvement. He remains limited with  pain with bending sometimes. He has not been having pain with his legs. He feels like he just needs to use common sense with what he does and not lift too much to strain his back. His home exercises are going well. He feels like he can manage his symptoms.    How long can you sit comfortably? 10-15 min    How long can you stand comfortably? 20-30 min    How long can you walk comfortably? 20-30 min    Patient Stated Goals Feel better    Currently in Pain? No/denies    Pain Onset 1 to 4 weeks ago              [facility] PT Assessment - 12/03/20 0001      Assessment   Medical Diagnosis Acute left-sided low back pain with left-sided sciatica    Referring Provider (PT) Cherly Beach, NP    Next MD Visit Feb 9th      Precautions   Precautions None      Restrictions   Weight Bearing Restrictions No      Balance Screen   Has the patient fallen in the past 6 months No    Has the patient had a decrease in activity level because of a fear of falling?  No    Is the  patient reluctant to leave their home because of a fear of falling?  No      Prior Function   Level of Independence Independent    Vocation Retired;On disability      Cognition   Overall Cognitive Status Within Functional Limits for tasks assessed      Observation/Other Assessments   Observations Ambulates with slighly crouched posture without AD    Focus on Therapeutic Outcomes (FOTO)  77% limited      AROM   Lumbar Flexion WNL    Lumbar Extension WNL    Lumbar - Right Side Bend WNL    Lumbar - Left Side Bend WNL    Lumbar - Right Rotation WNL    Lumbar - Left Rotation WNL      Transfers   Five time sit to stand comments  10.96 seconds without UE use      Ambulation/Gait   Ambulation/Gait Yes    Ambulation/Gait Assistance 7: Independent    Ambulation Distance (Feet) 410 Feet    Assistive device None    Gait Pattern Within Functional Limits    Ambulation Surface Level;Indoor    Gait Comments 2MWT                                  PT Education - 12/03/20 [phone]    Education Details Patient educated on HEP, exercise mechanics, progress made, walking program, exercise at senior center, returning to PT if needed    Person(s) Educated Patient    Methods Explanation;Demonstration    Comprehension Verbalized understanding;Returned demonstration            PT Short Term Goals - 12/03/20 0825      PT SHORT TERM GOAL #1   Title Patient will be independent with HEP in order to improve functional outcomes.    Time 3    Period Weeks    Status Achieved    Target Date 12/10/20      PT SHORT TERM GOAL #2   Title Patient will report at least 25% improvement in symptoms for improved quality of life.    Time 3    Period Weeks    Status Achieved    Target Date 12/10/20      PT SHORT TERM GOAL #3   Title Patient will be able to complete 5x STS in under 11.4 seconds in order to reduce the risk of falls.    Baseline 25 sec    Time 3    Period Weeks    Status New    Target Date 12/10/20             PT Long Term Goals - 12/03/20 0826      PT LONG TERM GOAL #1   Title Patient will report lumbar/LLE pain not exceeding 3/10 with activity    Baseline 0/10    Time 6    Period Weeks    Status Achieved      PT LONG TERM GOAL #2   Title Patient will improve on FOTO score to meet predicted outcomes to improve functional independence    Baseline 63% function, 1/26 77% function    Time 6    Period Weeks    Status Achieved      PT LONG TERM GOAL #3   Title Patient will ambulate x 350 ft during 2MWT without instance of LLE pain/buckling    Baseline 200 ft with unsteadiness; 1/26  410 without AD or unsteadiness    Time 6    Period Weeks    Status Achieved                 Plan - 12/03/20 0817    Clinical Impression Statement Patient has met all short and long term goals at this time with ability to complete HEP and improvement in symptoms, lumbar ROM, strength,  gait, and functional mobility. He remains limited by intermittent symptoms with lumbar flexion but overall feels able to manage condition independently with completion of HEP. Patient educated on progress made, continuing HEP, strength training at senior center, walking program, remaining active, and returning to physical therapy if needed. Patient discharged from physical therapy at this time.    Personal Factors and Comorbidities Age;Fitness;Time since onset of injury/illness/exacerbation;Comorbidity 1    Comorbidities PMH    Examination-Activity Limitations Lift;Stairs;Squat;Sleep;Locomotion Level;Carry;Sit;Transfers    Examination-Participation Restrictions Cleaning;Community Activity;Laundry    Stability/Clinical Decision Making Stable/Uncomplicated    Rehab Potential Good    PT Frequency 2x / week    PT Duration 6 weeks    PT Treatment/Interventions ADLs/Self Care Home Management;Aquatic Therapy;DME Instruction;Ultrasound;Traction;Moist Heat;Gait training;Stair training;Functional mobility training;Therapeutic activities;Therapeutic exercise;Balance training;Patient/family education;Neuromuscular re-education;Manual techniques;Passive range of motion;Taping;Energy conservation;Dry needling;Spinal Manipulations;Joint Manipulations    PT Next Visit Plan continue with extension based exercise, progress hip and core strengthening as tolerated    PT Home Exercise Plan prone with support, sitting with lumbar support, LTR 11/26/20 press up, hip extension in prone 1/24 bridge, ab set, piriformis stretch    Consulted and Agree with Plan of Care Patient           Patient will benefit from skilled therapeutic intervention in order to improve the following deficits and impairments:  Abnormal gait,Decreased activity tolerance,Decreased balance,Decreased mobility,Decreased knowledge of use of DME,Decreased endurance,Decreased range of motion,Decreased strength,Difficulty walking,Increased muscle  spasms,Impaired flexibility,Improper body mechanics,Postural dysfunction,Obesity,Pain  Visit Diagnosis: Acute left-sided low back pain with left-sided sciatica  Difficulty in walking, not elsewhere classified  Unsteadiness on feet     Problem List Patient Active Problem List   Diagnosis Date Noted  . Acute left-sided low back pain with left-sided sciatica 11/04/2020  . Encounter for examination following treatment at hospital 11/04/2020  . Seasonal allergies 09/12/2020  . Nasal congestion 09/12/2020  . Osteoarthritis of right knee 09/02/2020  . Type 2 diabetes mellitus with stage 3a chronic kidney disease, with long-term current use of insulin ([facility]) 02/05/2020  . Vitamin D deficiency 01/11/2020  . Obesity (BMI 30.0-34.9) 09/03/2019  . Dental caries 09/03/2019  . Chronic elbow pain, left 06/23/2019  . Type 2 diabetes mellitus with hyperglycemia ([facility]) 05/03/2019  . Chest pain with moderate risk of acute coronary syndrome 09/03/2018  . Mixed hyperlipidemia 05/29/2017  . Spasm of thoracic back muscle 10/06/2016  . Pain in joint involving multiple sites 02/12/2016  . GERD (gastroesophageal reflux disease) 02/12/2016  . Essential hypertension, benign 02/10/2016  . Anxiety and depression 02/10/2016    8:54 AM, 12/03/20 Mearl Latin PT, DPT Physical Therapist at [place], [place], [address] Phone: [phone]   Fax:  [phone]  Name: Khush Pasion MRN: [id number] Date of Birth: 1956/05/22

## 2020-12-08 ENCOUNTER — Encounter (HOSPITAL_COMMUNITY): Payer: Medicare HMO

## 2020-12-10 ENCOUNTER — Encounter (HOSPITAL_COMMUNITY): Payer: Medicare HMO

## 2020-12-10 DIAGNOSIS — E782 Mixed hyperlipidemia: Secondary | ICD-10-CM | POA: Diagnosis not present

## 2020-12-10 DIAGNOSIS — I10 Essential (primary) hypertension: Secondary | ICD-10-CM | POA: Diagnosis not present

## 2020-12-11 ENCOUNTER — Other Ambulatory Visit: Payer: Self-pay

## 2020-12-11 ENCOUNTER — Telehealth (INDEPENDENT_AMBULATORY_CARE_PROVIDER_SITE_OTHER): Payer: Medicare HMO | Admitting: Family Medicine

## 2020-12-11 DIAGNOSIS — Z794 Long term (current) use of insulin: Secondary | ICD-10-CM | POA: Diagnosis not present

## 2020-12-11 DIAGNOSIS — F419 Anxiety disorder, unspecified: Secondary | ICD-10-CM

## 2020-12-11 DIAGNOSIS — I10 Essential (primary) hypertension: Secondary | ICD-10-CM

## 2020-12-11 DIAGNOSIS — N1831 Chronic kidney disease, stage 3a: Secondary | ICD-10-CM | POA: Diagnosis not present

## 2020-12-11 DIAGNOSIS — F32A Depression, unspecified: Secondary | ICD-10-CM | POA: Diagnosis not present

## 2020-12-11 DIAGNOSIS — E782 Mixed hyperlipidemia: Secondary | ICD-10-CM

## 2020-12-11 DIAGNOSIS — R0981 Nasal congestion: Secondary | ICD-10-CM

## 2020-12-11 DIAGNOSIS — E1165 Type 2 diabetes mellitus with hyperglycemia: Secondary | ICD-10-CM | POA: Diagnosis not present

## 2020-12-11 DIAGNOSIS — E1122 Type 2 diabetes mellitus with diabetic chronic kidney disease: Secondary | ICD-10-CM

## 2020-12-11 LAB — CMP14+EGFR
ALT: 20 IU/L (ref 0–44)
AST: 20 IU/L (ref 0–40)
Albumin/Globulin Ratio: 1.3 (ref 1.2–2.2)
Albumin: 4.3 g/dL (ref 3.8–4.8)
Alkaline Phosphatase: 77 IU/L (ref 44–121)
BUN/Creatinine Ratio: 11 (ref 10–24)
BUN: 15 mg/dL (ref 8–27)
Bilirubin Total: 1 mg/dL (ref 0.0–1.2)
CO2: 23 mmol/L (ref 20–29)
Calcium: 9.7 mg/dL (ref 8.6–10.2)
Chloride: 99 mmol/L (ref 96–106)
Creatinine, Ser: 1.39 mg/dL — ABNORMAL HIGH (ref 0.76–1.27)
GFR calc Af Amer: 61 mL/min/{1.73_m2} (ref >59)
GFR calc non Af Amer: 53 mL/min/{1.73_m2} — ABNORMAL LOW (ref >59)
Globulin, Total: 3.2 g/dL (ref 1.5–4.5)
Glucose: 163 mg/dL — ABNORMAL HIGH (ref 65–99)
Potassium: 4.7 mmol/L (ref 3.5–5.2)
Sodium: 138 mmol/L (ref 134–144)
Total Protein: 7.5 g/dL (ref 6.0–8.5)

## 2020-12-11 LAB — LIPID PANEL
Chol/HDL Ratio: 3.2 ratio (ref 0.0–5.0)
Cholesterol, Total: 120 mg/dL (ref 100–199)
HDL: 37 mg/dL — ABNORMAL LOW (ref >39)
LDL Chol Calc (NIH): 50 mg/dL (ref 0–99)
Triglycerides: 205 mg/dL — ABNORMAL HIGH (ref 0–149)
VLDL Cholesterol Cal: 33 mg/dL (ref 5–40)

## 2020-12-11 MED ORDER — EZETIMIBE 10 MG PO TABS
10.0000 mg | ORAL_TABLET | Freq: Every day | ORAL | 3 refills | Status: DC
Start: 2020-12-11 — End: 2021-07-06

## 2020-12-11 MED ORDER — MONTELUKAST SODIUM 10 MG PO TABS: 10.0000 mg | ORAL_TABLET | Freq: Every day | ORAL | 3 refills | Status: DC

## 2020-12-11 NOTE — Patient Instructions (Addendum)
F/U in  3. 5 months, call if you need me before  hBA1C and glyco HB as a control in office if available  New additional med for triglyceride , zetia, 10 mg daily, continue crestor as before   MOntelukast prescribed for uncontrolled allergy symptoms  It is important that you exercise regularly at least 30 minutes 5 times a week. If you develop chest pain, have severe difficulty breathing, or feel very tired, stop exercising immediately and seek medical attention   Think about what you will eat, plan ahead. Choose " clean, [place], fresh or frozen" over canned, processed or packaged foods which are more sugary, salty and fatty. 70 to 75% of food eaten should be vegetables and fruit. Three meals at set times with snacks allowed between meals, but they must be fruit or vegetables. Aim to eat over a 12 hour period , example 7 am to 7 pm, and STOP after  your last meal of the day. Drink water,generally about 64 ounces per day, no other drink is as healthy. Fruit juice is best enjoyed in a healthy way, by EATING the fruit. Thanks for choosing [facility], we consider it a privelige to serve you.

## 2020-12-11 NOTE — Progress Notes (Signed)
Virtual Visit via video  I connected with Norman Peters on 12/11/20 at  2:40 PM EST by video and verified that I am speaking with the correct person using two identifiers.  Location: Patient: home Provider: office     History of Present Illness: F/U chronic problems and address any new or current concerns. Review and update medications and allergies. Review recent lab and radiologic data . Update routine health maintainace. Review an encourage improved health habits to include nutrition, exercise and  sleep .   fBG range 140 to 1599, has been as low as 119, bedtime under 200 Denies polyuria, polydipsia, blurred vision , or hypoglycemic episodes. Denies recent fever or chills. C/o  nasal congestion, denies  ear pain or sore throat. Denies chest congestion, productive cough or wheezing. Denies chest pains, palpitations and leg swelling Denies abdominal pain, nausea, vomiting,diarrhea or constipation.   Denies dysuria, frequency, hesitancy or incontinence. Denies joint pain, swelling and limitation in mobility. Denies headaches, seizures, numbness, or tingling. Denies depression, anxiety or insomnia. Denies skin break down or rash.       Observations/Objective: There were no vitals taken for this visit. Good communication with no confusion and intact memory. Alert and oriented x 3 No signs of respiratory distress during speech    Assessment and Plan: Nasal congestion Uncontrolled allergy symptom, start daily montelukast  Anxiety and depression Controlled, no change in medication   Essential hypertension, benign DASH diet and commitment to daily physical activity for a minimum of 30 minutes discussed and encouraged, as a part of hypertension management. The importance of attaining a healthy weight is also discussed.  BP/Weight 11/04/2020 10/30/2020 09/12/2020 09/02/2020 04/08/2020 04/01/2020 2/45/8099  Systolic BP 833 825 - 053 976 734 193  Diastolic BP 82 92 - 80 84  81 81  Wt. (Lbs) 216 - 216 219 218 216 216.8  BMI 32.84 - 32.84 33.3 33.15 32.84 32.96   Needs in office assessment    Type 2 diabetes mellitus with stage 3a chronic kidney disease, with long-term current use of insulin ([facility]) Deteriorated needs endo eval and management , referred Norman Peters is reminded of the importance of commitment to daily physical activity for 30 minutes or more, as able and the need to limit carbohydrate intake to 30 to 60 grams per meal to help with blood sugar control.   The need to take medication as prescribed, test blood sugar as directed, and to call between visits if there is a concern that blood sugar is uncontrolled is also discussed.   Norman Peters is reminded of the importance of daily foot exam, annual eye examination, and good blood sugar, blood pressure and cholesterol control.  Diabetic Labs Latest Ref Rng & Units 12/12/2020 12/10/2020 10/30/2020 09/02/2020 08/25/2020  HbA1c 4.8 - 5.6 % 9.9(H) - - 8.9(A) -  Microalbumin Not Estab. ug/mL - - - - -  Micro/Creat Ratio 0.0 - 30.0 mg/g creat - - - - -  Chol 100 - 199 mg/dL - 120 - - 118  HDL >39 mg/dL - 37(L) - - 35(L)  Calc LDL 0 - 99 mg/dL - 50 - - 55  Triglycerides 0 - 149 mg/dL - 205(H) - - 221(H)  Creatinine 0.76 - 1.27 mg/dL - 1.39(H) 1.29(H) - 1.47(H)   BP/Weight 11/04/2020 10/30/2020 09/12/2020 09/02/2020 04/08/2020 04/01/2020 7/90/2409  Systolic BP 735 329 - 924 268 341 962  Diastolic BP 82 92 - 80 84 81 81  Wt. (Lbs) 216 - 216 219 218 216 216.8  BMI 32.84 - 32.84 33.3 33.15 32.84 32.96   Foot/eye exam completion dates Latest Ref Rng & Units 09/02/2020 12/31/2019  Eye Exam No Retinopathy - No Retinopathy  Foot Form Completion - Done -        Mixed hyperlipidemia Hyperlipidemia:Low fat diet discussed and encouraged.   Lipid Panel  Lab Results  Component Value Date   CHOL 120 12/10/2020   HDL 37 (L) 12/10/2020   LDLCALC 50 12/10/2020   TRIG 205 (H) 12/10/2020   CHOLHDL 3.2 12/10/2020  add  zetia daily, elevated TG       Follow Up Instructions:    I discussed the assessment and treatment plan with the patient. The patient was provided an opportunity to ask questions and all were answered. The patient agreed with the plan and demonstrated an understanding of the instructions.   The patient was advised to call back or seek an in-person evaluation if the symptoms worsen or if the condition fails to improve as anticipated.  I provided 22 minutes of non-face-to-face time during this encounter.   Tula Nakayama, MD

## 2020-12-12 DIAGNOSIS — E1165 Type 2 diabetes mellitus with hyperglycemia: Secondary | ICD-10-CM | POA: Diagnosis not present

## 2020-12-12 DIAGNOSIS — Z794 Long term (current) use of insulin: Secondary | ICD-10-CM | POA: Diagnosis not present

## 2020-12-13 ENCOUNTER — Encounter: Payer: Self-pay | Admitting: Family Medicine

## 2020-12-13 LAB — HEMOGLOBIN A1C
Est. average glucose Bld gHb Est-mCnc: 237 mg/dL
Hgb A1c MFr Bld: 9.9 % — ABNORMAL HIGH (ref 4.8–5.6)

## 2020-12-13 NOTE — Assessment & Plan Note (Signed)
DASH diet and commitment to daily physical activity for a minimum of 30 minutes discussed and encouraged, as a part of hypertension management. The importance of attaining a healthy weight is also discussed.  BP/Weight 11/04/2020 10/30/2020 09/12/2020 09/02/2020 04/08/2020 04/01/2020 01/13/6577  Systolic BP 469 629 - 528 413 244 010  Diastolic BP 82 92 - 80 84 81 81  Wt. (Lbs) 216 - 216 219 218 216 216.8  BMI 32.84 - 32.84 33.3 33.15 32.84 32.96   Needs in office assessment

## 2020-12-13 NOTE — Assessment & Plan Note (Signed)
Hyperlipidemia:Low fat diet discussed and encouraged.   Lipid Panel  Lab Results  Component Value Date   CHOL 120 12/10/2020   HDL 37 (L) 12/10/2020   LDLCALC 50 12/10/2020   TRIG 205 (H) 12/10/2020   CHOLHDL 3.2 12/10/2020  add zetia daily, elevated TG

## 2020-12-13 NOTE — Assessment & Plan Note (Signed)
Uncontrolled allergy symptom, start daily montelukast

## 2020-12-13 NOTE — Assessment & Plan Note (Signed)
Controlled, no change in medication  

## 2020-12-13 NOTE — Assessment & Plan Note (Signed)
Deteriorated needs endo eval and management , referred Norman Peters is reminded of the importance of commitment to daily physical activity for 30 minutes or more, as able and the need to limit carbohydrate intake to 30 to 60 grams per meal to help with blood sugar control.   The need to take medication as prescribed, test blood sugar as directed, and to call between visits if there is a concern that blood sugar is uncontrolled is also discussed.   Norman Peters is reminded of the importance of daily foot exam, annual eye examination, and good blood sugar, blood pressure and cholesterol control.  Diabetic Labs Latest Ref Rng & Units 12/12/2020 12/10/2020 10/30/2020 09/02/2020 08/25/2020  HbA1c 4.8 - 5.6 % 9.9(H) - - 8.9(A) -  Microalbumin Not Estab. ug/mL - - - - -  Micro/Creat Ratio 0.0 - 30.0 mg/g creat - - - - -  Chol 100 - 199 mg/dL - 120 - - 118  HDL >39 mg/dL - 37(L) - - 35(L)  Calc LDL 0 - 99 mg/dL - 50 - - 55  Triglycerides 0 - 149 mg/dL - 205(H) - - 221(H)  Creatinine 0.76 - 1.27 mg/dL - 1.39(H) 1.29(H) - 1.47(H)   BP/Weight 11/04/2020 10/30/2020 09/12/2020 09/02/2020 04/08/2020 04/01/2020 9/56/3875  Systolic BP 643 329 - 518 841 660 630  Diastolic BP 82 92 - 80 84 81 81  Wt. (Lbs) 216 - 216 219 218 216 216.8  BMI 32.84 - 32.84 33.3 33.15 32.84 32.96   Foot/eye exam completion dates Latest Ref Rng & Units 09/02/2020 12/31/2019  Eye Exam No Retinopathy - No Retinopathy  Foot Form Completion - Done -

## 2020-12-15 ENCOUNTER — Encounter (HOSPITAL_COMMUNITY): Payer: Medicare HMO

## 2020-12-17 ENCOUNTER — Encounter: Payer: Self-pay | Admitting: Nurse Practitioner

## 2020-12-17 ENCOUNTER — Ambulatory Visit (INDEPENDENT_AMBULATORY_CARE_PROVIDER_SITE_OTHER): Payer: Medicare HMO | Admitting: Nurse Practitioner

## 2020-12-17 ENCOUNTER — Other Ambulatory Visit: Payer: Self-pay

## 2020-12-17 ENCOUNTER — Ambulatory Visit: Payer: Medicare HMO | Admitting: Family Medicine

## 2020-12-17 ENCOUNTER — Encounter (HOSPITAL_COMMUNITY): Payer: Medicare HMO

## 2020-12-17 VITALS — BP 140/90 | HR 101 | Ht 68.0 in | Wt 219.2 lb

## 2020-12-17 DIAGNOSIS — I10 Essential (primary) hypertension: Secondary | ICD-10-CM

## 2020-12-17 DIAGNOSIS — E1122 Type 2 diabetes mellitus with diabetic chronic kidney disease: Secondary | ICD-10-CM

## 2020-12-17 DIAGNOSIS — N182 Chronic kidney disease, stage 2 (mild): Secondary | ICD-10-CM

## 2020-12-17 DIAGNOSIS — E782 Mixed hyperlipidemia: Secondary | ICD-10-CM | POA: Diagnosis not present

## 2020-12-17 DIAGNOSIS — Z794 Long term (current) use of insulin: Secondary | ICD-10-CM

## 2020-12-17 MED ORDER — TRUE METRIX AIR GLUCOSE METER DEVI: 1.0000 | Freq: Once | 0 refills | Status: AC

## 2020-12-17 NOTE — Progress Notes (Signed)
Endocrinology follow-up note    12/17/2020, 3:10 PM   Subjective:    Patient ID: Norman Peters, male    DOB: 29-Nov-1955.  Norman Peters is being seen in follow-up after he was seen in consultation for management of currently uncontrolled symptomatic diabetes requested by  Fayrene Helper, MD.   Past Medical History:  Diagnosis Date  . Acute left-sided low back pain with left-sided sciatica 11/04/2020  . Anxiety   . Arthritis   . Depression 2001  . Diabetes mellitus 2010  . GERD (gastroesophageal reflux disease)   . Hyperlipidemia   . Hypertension 2005    Past Surgical History:  Procedure Laterality Date  . COLONOSCOPY N/A 07/07/2016   Procedure: COLONOSCOPY;  Surgeon: Rogene Houston, MD;  Location: [facility];  Service: Endoscopy;  Laterality: N/A;  1030  . FINGER SURGERY Left 1974   fifth , trauma on job, had to replace/repair finger  . HERNIA REPAIR  3254   x3, umbilical and bilateral hernia    Social History   Socioeconomic History  . Marital status: Married    Spouse name: Not on file  . Number of children: Not on file  . Years of education: Not on file  . Highest education level: Not on file  Occupational History  . Not on file  Tobacco Use  . Smoking status: Former Smoker    Packs/day: 0.50    Years: 30.00    Pack years: 15.00    Types: Cigarettes    Quit date: 01/20/2011    Years since quitting: 9.9  . Smokeless tobacco: Never Used  Vaping Use  . Vaping Use: Never used  Substance and Sexual Activity  . Alcohol use: No  . Drug use: No  . Sexual activity: Not Currently  Other Topics Concern  . Not on file  Social History Narrative  . Not on file   Social Determinants of Health   Financial Resource Strain: Low Risk   . Difficulty of Paying Living Expenses: Not very hard  Food Insecurity: No Food Insecurity  . Worried About Charity fundraiser in the Last Year:  Never true  . Ran Out of Food in the Last Year: Never true  Transportation Needs: No Transportation Needs  . Lack of Transportation (Medical): No  . Lack of Transportation (Non-Medical): No  Physical Activity: Insufficiently Active  . Days of Exercise per Week: 1 day  . Minutes of Exercise per Session: 30 min  Stress: No Stress Concern Present  . Feeling of Stress : Only a little  Social Connections: Moderately Integrated  . Frequency of Communication with Friends and Family: Three times a week  . Frequency of Social Gatherings with Friends and Family: Three times a week  . Attends Religious Services: 1 to 4 times per year  . Active Member of Clubs or Organizations: No  . Attends Archivist Meetings: Never  . Marital Status: Married    Family History  Problem Relation Age of Onset  . Stroke Mother     Outpatient Encounter Medications as of 12/17/2020  Medication Sig  . amLODipine (NORVASC) 10  MG tablet TAKE 1 TABLET EVERY DAY (Patient taking differently: Take 10 mg by mouth daily.)  . aspirin EC 81 MG tablet Take 81 mg by mouth daily.  . Blood Glucose Monitoring Suppl (TRUE METRIX AIR GLUCOSE METER) DEVI 1 Device by Does not apply route once for 1 dose.  . busPIRone (BUSPAR) 5 MG tablet TAKE 1 TABLET TWICE DAILY (Patient taking differently: Take 5 mg by mouth 2 (two) times daily.)  . Cholecalciferol (VITAMIN D3) 400 units CAPS Take 2 capsules by mouth daily.  Marland Kitchen ezetimibe (ZETIA) 10 MG tablet Take 1 tablet (10 mg total) by mouth daily.  Marland Kitchen FLUoxetine (PROZAC) 40 MG capsule Take 1 capsule (40 mg total) by mouth daily.  . fluticasone (FLONASE) 50 MCG/ACT nasal spray Place 2 sprays into both nostrils daily.  . insulin glargine (LANTUS) 100 UNIT/ML Solostar Pen Inject 45 units under the skin every morning  . Insulin Pen Needle 32G X 4 MM MISC Use daily to inject insulin DX e11.65  . metFORMIN (GLUCOPHAGE) 500 MG tablet TAKE 1 TABLET TWICE DAILY WITH MEALS (Patient taking  differently: Take 500 mg by mouth 2 (two) times daily with a meal.)  . montelukast (SINGULAIR) 10 MG tablet Take 1 tablet (10 mg total) by mouth at bedtime.  . Multiple Vitamins-Minerals (MULTI-BETIC DIABETES) TABS Take 1 tablet by mouth daily.  . rosuvastatin (CRESTOR) 5 MG tablet Take one tablet by mouth every Monday, Wednesday and Friday  . spironolactone (ALDACTONE) 50 MG tablet TAKE 1 TABLET EVERY DAY (Patient taking differently: Take 50 mg by mouth daily.)  . tadalafil (CIALIS) 10 MG tablet Take 1 tablet (10 mg total) by mouth daily as needed for erectile dysfunction.  . TRUE METRIX BLOOD GLUCOSE TEST test strip TEST ONE TIME DAILY  . TRUEplus Lancets 28G MISC USE  TO  TEST ONE TIME DAILY   No facility-administered encounter medications on file as of 12/17/2020.    ALLERGIES: Allergies  Allergen Reactions  . Ibuprofen Rash  . Lisinopril Cough  . Other Other (See Comments)  . Strawberry [Berry]     VACCINATION STATUS: Immunization History  Administered Date(s) Administered  . Fluad Quad(high Dose 65+) 09/02/2020  . Influenza,inj,Quad PF,6+ Mos 07/14/2016, 08/25/2017, 08/30/2018, 07/02/2019  . PFIZER(Purple Top)SARS-COV-2 Vaccination 01/25/2020, 02/19/2020, 09/28/2020  . Pneumococcal Polysaccharide-23 02/10/2016    Diabetes He presents for his follow-up diabetic visit. He has type 2 diabetes mellitus. Onset time: He was diagnosed at approximate age of 32 years. His disease course has been worsening. There are no hypoglycemic associated symptoms. Pertinent negatives for hypoglycemia include no confusion, headaches, pallor or seizures. Associated symptoms include polydipsia and polyuria. Pertinent negatives for diabetes include no chest pain, no fatigue, no polyphagia and no weakness. There are no hypoglycemic complications. Symptoms are worsening. Diabetic complications include nephropathy and peripheral neuropathy. Risk factors for coronary artery disease include diabetes mellitus,  dyslipidemia, obesity, hypertension, male sex, tobacco exposure and sedentary lifestyle. Current diabetic treatment includes insulin injections and oral agent (monotherapy). He is compliant with treatment most of the time. His weight is fluctuating minimally. He is following a generally unhealthy diet. When asked about meal planning, he reported none. He has had a previous visit with a dietitian. He rarely participates in exercise. His home blood glucose trend is decreasing steadily. His breakfast blood glucose range is generally 140-180 mg/dl. His overall blood glucose range is 140-180 mg/dl. (He presents today with his meter, no logs, showing 7-day average of 164, 14-day average of 160, 30-day average of  166, 60-day average of 179, and 90-day average of 179.  He reports he did overindulge during the holidays as far as diet is concerned.  His previsit A1c was 9.9%, increasing from previous visit of 9%.  Recently, his glucose has improved.  He denies any significant hypoglycemia.) An ACE inhibitor/angiotensin II receptor blocker is not being taken. He does not see a podiatrist.Eye exam is current.  Hyperlipidemia This is a chronic problem. The current episode started more than 1 year ago. The problem is uncontrolled. Recent lipid tests were reviewed and are variable. Exacerbating diseases include chronic renal disease, diabetes and obesity. Pertinent negatives include no chest pain, myalgias or shortness of breath. Current antihyperlipidemic treatment includes statins. The current treatment provides mild improvement of lipids. Compliance problems include adherence to exercise and adherence to diet.  Risk factors for coronary artery disease include diabetes mellitus, dyslipidemia, male sex, obesity, hypertension and a sedentary lifestyle.  Hypertension This is a chronic problem. The current episode started more than 1 year ago. The problem is unchanged. The problem is controlled. Pertinent negatives include no  chest pain, headaches, neck pain, palpitations or shortness of breath. There are no associated agents to hypertension. Risk factors for coronary artery disease include diabetes mellitus, male gender, smoking/tobacco exposure, sedentary lifestyle, obesity and dyslipidemia. Past treatments include calcium channel blockers and diuretics. The current treatment provides mild improvement. There are no compliance problems.  Hypertensive end-organ damage includes kidney disease. Identifiable causes of hypertension include chronic renal disease.    Review of systems  Constitutional: + Minimally fluctuating body weight,  current Body mass index is 33.33 kg/m. , no fatigue, no subjective hyperthermia, no subjective hypothermia Eyes: no blurry vision, no xerophthalmia ENT: no sore throat, no nodules palpated in throat, no dysphagia/odynophagia, no hoarseness Cardiovascular: no chest pain, no shortness of breath, no palpitations, no leg swelling Respiratory: no cough, no shortness of breath Gastrointestinal: no nausea/vomiting/diarrhea Musculoskeletal: no muscle/joint aches Skin: no rashes, no hyperemia Neurological: no tremors, + numbness/tingling to LLE, no dizziness Psychiatric: no depression, no anxiety   Objective:    BP 140/90 (BP Location: Left Arm)   Pulse (!) 101   Ht 5\' 8"  (1.727 m)   Wt 219 lb 3.2 oz (99.4 kg)   BMI 33.33 kg/m   Wt Readings from Last 3 Encounters:  12/17/20 219 lb 3.2 oz (99.4 kg)  11/04/20 216 lb (98 kg)  09/12/20 216 lb (98 kg)    BP Readings from Last 3 Encounters:  12/17/20 140/90  11/04/20 (!) 142/82  10/30/20 (!) 141/92    Physical Exam- Limited  Constitutional:  Body mass index is 33.33 kg/m. , not in acute distress, normal state of mind Eyes:  EOMI, no exophthalmos Neck: Supple Cardiovascular: RRR, no murmers, rubs, or gallops, no edema Respiratory: Adequate breathing efforts, no crackles, rales, rhonchi, or wheezing Musculoskeletal: no gross  deformities, strength intact in all four extremities, no gross restriction of joint movements Skin:  no rashes, no hyperemia Neurological: no tremor with outstretched hands   POCT ABI Results 12/17/20   Right ABI:  1.11      Left ABI:  1.01  Right leg systolic / diastolic: 284/13 mmHg Left leg systolic / diastolic: 244/01 mmHg  Arm systolic / diastolic: 027/25 mmHG  Detailed report will be scanned into patient chart.   CMP     Component Value Date/Time   NA 138 12/10/2020 0933   K 4.7 12/10/2020 0933   CL 99 12/10/2020 0933   CO2 23  12/10/2020 0933   GLUCOSE 163 (H) 12/10/2020 0933   GLUCOSE 308 (H) 10/30/2020 0550   BUN 15 12/10/2020 0933   CREATININE 1.39 (H) 12/10/2020 0933   CREATININE 1.47 (H) 08/25/2020 0927   CALCIUM 9.7 12/10/2020 0933   PROT 7.5 12/10/2020 0933   ALBUMIN 4.3 12/10/2020 0933   AST 20 12/10/2020 0933   ALT 20 12/10/2020 0933   ALKPHOS 77 12/10/2020 0933   BILITOT 1.0 12/10/2020 0933   GFRNONAA 53 (L) 12/10/2020 0933   GFRNONAA >60 10/30/2020 0550   GFRNONAA 50 (L) 08/25/2020 0927   GFRAA 61 12/10/2020 0933   GFRAA 58 (L) 08/25/2020 0927     Diabetic Labs (most recent): Lab Results  Component Value Date   HGBA1C 9.9 (H) 12/12/2020   HGBA1C 8.9 (A) 09/02/2020   HGBA1C 9.0 (A) 04/08/2020     Lipid Panel     Component Value Date/Time   CHOL 120 12/10/2020 0933   TRIG 205 (H) 12/10/2020 0933   HDL 37 (L) 12/10/2020 0933   CHOLHDL 3.2 12/10/2020 0933   CHOLHDL 3.4 08/25/2020 0927   VLDL 39 (H) 05/14/2017 0831   LDLCALC 50 12/10/2020 0933   LDLCALC 55 08/25/2020 0927   LABVLDL 33 12/10/2020 0933      Lab Results  Component Value Date   TSH 2.79 08/25/2020   TSH 1.96 08/24/2019   TSH 1.34 01/31/2018   TSH 1.61 02/10/2016   FREET4 1.0 08/25/2020     Assessment & Plan:   1) Type 2 diabetes mellitus with stage 3a chronic kidney disease, with long-term current use of insulin ([place])  - Norman Peters has currently uncontrolled  symptomatic type 2 DM since  65 years of age.  He presents today with his meter, no logs, showing 7-day average of 164, 14-day average of 160, 30-day average of 166, 60-day average of 179, and 90-day average of 179.  He reports he did overindulge during the holidays as far as diet is concerned.  His previsit A1c was 9.9%, increasing from previous visit of 9%.  Recently, his glucose has improved.  He denies any significant hypoglycemia.   Recent labs reviewed.  - I had a long discussion with him about the progressive nature of diabetes and the pathology behind its complications. -his diabetes is complicated by CKD and he remains at a high risk for more acute and chronic complications which include CAD, CVA, CKD, retinopathy, and neuropathy. These are all discussed in detail with him.  - Nutritional counseling repeated at each appointment due to patients tendency to fall back in to old habits.  - The patient admits there is a room for improvement in their diet and drink choices. -  Suggestion is made for the patient to avoid simple carbohydrates from their diet including Cakes, Sweet Desserts / Pastries, Ice Cream, Soda (diet and regular), Sweet Tea, Candies, Chips, Cookies, Sweet Pastries,  Store Bought Juices, Alcohol in Excess of  1-2 drinks a day, Artificial Sweeteners, Coffee Creamer, and "Sugar-free" Products. This will help patient to have stable blood glucose profile and potentially avoid unintended weight gain.   - I encouraged the patient to switch to  unprocessed or minimally processed complex starch and increased protein intake (animal or plant source), fruits, and vegetables.   - Patient is advised to stick to a routine mealtimes to eat 3 meals  a day and avoid unnecessary snacks ( to snack only to correct hypoglycemia).  - I have approached him with the following individualized plan  to manage his diabetes and patient agrees:   - he will continue to benefit from basal insulin.     -Given his stable glycemic profile and lack of logs to observe patterns, he is advised to continue Lantus 40 units SQ daily at bedtime.  He is advised to continue Metformin 500 mg po twice daily with meals (will not tolerate higher dose Metformin due to CKD).  -He is encouraged to continue monitoring blood glucose twice daily, before breakfast and before bed, and to call the clinic if he has readings less than 70 or greater than 300 for 3 tests in a row.  - he is warned not to take insulin without proper monitoring per orders.  -He reports that he had developed unexplained cough while taking glipizide.  After he finished his glipizide, reportedly the cough has improved.  He is advised to stay off of glipizide for now.   - he will be considered for incretin therapy as appropriate next visit.  - Specific targets for  A1c;  LDL, HDL,  and Triglycerides were discussed with the patient.  2) Blood Pressure /Hypertension:  His blood pressure is controlled to target.  he is advised to continue his current medications including Amlodipine 10 mg p.o. daily with breakfast and Spironolactone 50 mg po daily.  3) Lipids/Hyperlipidemia:   His most recent lipid panel from 12/10/20 shows controlled LDL of 50 and elevated triglycerides of 205.  He is advised to continue Crestor 5 mg po daily at bedtime.  Side effects and precautions discussed with him.   4)  Weight/Diet:  Body mass index is 33.33 kg/m.  -   clearly complicating his diabetes care.   he is a candidate for weight loss. I discussed with him the fact that loss of 5 - 10% of his  current body weight will have the most impact on his diabetes management.  Exercise, and detailed carbohydrates information provided  -  detailed on discharge instructions.  5) Chronic Care/Health Maintenance: -he  is on a Statin medications and  is encouraged to initiate and continue to follow up with Ophthalmology, Dentist,  Podiatrist at least yearly or according to  recommendations, and advised to stay away from smoking. I have recommended yearly flu vaccine and pneumonia vaccine at least every 5 years; moderate intensity exercise for up to 150 minutes weekly; and  sleep for at least 7 hours a day.  - he is  advised to maintain close follow up with Fayrene Helper, MD for primary care needs, as well as his other providers for optimal and coordinated care.  - Time spent on this patient care encounter:  35 min, of which > 50% was spent in  counseling and the rest reviewing his blood glucose logs , discussing his hypoglycemia and hyperglycemia episodes, reviewing his current and  previous labs / studies  ( including abstraction from other facilities) and medications  doses and developing a  long term treatment plan and documenting his care.   Please refer to Patient Instructions for Blood Glucose Monitoring and Insulin/Medications Dosing Guide"  in media tab for additional information. Please  also refer to " Patient Self Inventory" in the Media  tab for reviewed elements of pertinent patient history.  [name] participated in the discussions, expressed understanding, and voiced agreement with the above plans.  All questions were answered to his satisfaction. he is encouraged to contact clinic should he have any questions or concerns prior to his return visit.   Follow  up plan: - Return in about 3 months (around 03/16/2021) for Diabetes follow up with A1c in office, No previsit labs, Bring glucometer and logs.  Rayetta Pigg, [facility] [address] [place], [place] [address] Phone: [phone] Fax: [phone]    12/17/2020, 3:10 PM

## 2020-12-17 NOTE — Patient Instructions (Signed)

## 2020-12-22 ENCOUNTER — Encounter (HOSPITAL_COMMUNITY): Payer: Medicare HMO

## 2020-12-24 ENCOUNTER — Encounter (HOSPITAL_COMMUNITY): Payer: Medicare HMO

## 2021-01-12 ENCOUNTER — Other Ambulatory Visit: Payer: Self-pay

## 2021-01-12 ENCOUNTER — Telehealth: Payer: Self-pay

## 2021-01-12 MED ORDER — METFORMIN HCL 500 MG PO TABS: 500.0000 mg | ORAL_TABLET | Freq: Two times a day (BID) | ORAL | 1 refills | Status: DC

## 2021-01-12 NOTE — Telephone Encounter (Signed)
Refill sent.

## 2021-01-12 NOTE — Telephone Encounter (Signed)
Patient needing refills sent for metformin p# [phone] he states he is completely out

## 2021-01-14 ENCOUNTER — Telehealth: Payer: Self-pay

## 2021-01-14 ENCOUNTER — Other Ambulatory Visit: Payer: Self-pay

## 2021-01-14 MED ORDER — METFORMIN HCL 500 MG PO TABS: 500.0000 mg | ORAL_TABLET | Freq: Two times a day (BID) | ORAL | 3 refills | Status: DC

## 2021-01-14 NOTE — Telephone Encounter (Signed)
Patient uses [facility] but hasnt received his metformin and has been out since Monday can we call this into [facility] in [place] so he can get this today ph# [phone]

## 2021-01-28 ENCOUNTER — Other Ambulatory Visit: Payer: Self-pay | Admitting: Family Medicine

## 2021-01-28 DIAGNOSIS — I10 Essential (primary) hypertension: Secondary | ICD-10-CM

## 2021-02-06 ENCOUNTER — Other Ambulatory Visit: Payer: Self-pay | Admitting: Family Medicine

## 2021-02-06 DIAGNOSIS — R0981 Nasal congestion: Secondary | ICD-10-CM

## 2021-02-06 DIAGNOSIS — J302 Other seasonal allergic rhinitis: Secondary | ICD-10-CM

## 2021-03-07 ENCOUNTER — Emergency Department (HOSPITAL_COMMUNITY)
Admission: EM | Admit: 2021-03-07 | Discharge: 2021-03-07 | Disposition: A | Discharge status: Home / Self Care | Payer: Medicare HMO | Attending: Emergency Medicine | Admitting: Emergency Medicine

## 2021-03-07 ENCOUNTER — Encounter (HOSPITAL_COMMUNITY): Payer: Self-pay

## 2021-03-07 ENCOUNTER — Emergency Department (HOSPITAL_COMMUNITY): Payer: Medicare HMO

## 2021-03-07 ENCOUNTER — Other Ambulatory Visit: Payer: Self-pay

## 2021-03-07 DIAGNOSIS — N1831 Chronic kidney disease, stage 3a: Secondary | ICD-10-CM | POA: Insufficient documentation

## 2021-03-07 DIAGNOSIS — E1122 Type 2 diabetes mellitus with diabetic chronic kidney disease: Secondary | ICD-10-CM | POA: Insufficient documentation

## 2021-03-07 DIAGNOSIS — Z7984 Long term (current) use of oral hypoglycemic drugs: Secondary | ICD-10-CM | POA: Diagnosis not present

## 2021-03-07 DIAGNOSIS — Z79899 Other long term (current) drug therapy: Secondary | ICD-10-CM | POA: Diagnosis not present

## 2021-03-07 DIAGNOSIS — R1032 Left lower quadrant pain: Secondary | ICD-10-CM | POA: Insufficient documentation

## 2021-03-07 DIAGNOSIS — R1030 Lower abdominal pain, unspecified: Secondary | ICD-10-CM | POA: Diagnosis not present

## 2021-03-07 DIAGNOSIS — Z87891 Personal history of nicotine dependence: Secondary | ICD-10-CM | POA: Diagnosis not present

## 2021-03-07 DIAGNOSIS — I129 Hypertensive chronic kidney disease with stage 1 through stage 4 chronic kidney disease, or unspecified chronic kidney disease: Secondary | ICD-10-CM | POA: Diagnosis not present

## 2021-03-07 DIAGNOSIS — Z7982 Long term (current) use of aspirin: Secondary | ICD-10-CM | POA: Diagnosis not present

## 2021-03-07 DIAGNOSIS — I700 Atherosclerosis of aorta: Secondary | ICD-10-CM | POA: Diagnosis not present

## 2021-03-07 DIAGNOSIS — Z794 Long term (current) use of insulin: Secondary | ICD-10-CM | POA: Diagnosis not present

## 2021-03-07 DIAGNOSIS — K5750 Diverticulosis of both small and large intestine without perforation or abscess without bleeding: Secondary | ICD-10-CM | POA: Diagnosis not present

## 2021-03-07 LAB — CBC WITH DIFFERENTIAL/PLATELET
Abs Immature Granulocytes: 0.02 10*3/uL (ref 0.00–0.07)
Basophils Absolute: 0.1 10*3/uL (ref 0.0–0.1)
Basophils Relative: 1 %
Eosinophils Absolute: 0.5 10*3/uL (ref 0.0–0.5)
Eosinophils Relative: 7 %
HCT: 34.2 % — ABNORMAL LOW (ref 39.0–52.0)
Hemoglobin: 11.8 g/dL — ABNORMAL LOW (ref 13.0–17.0)
Immature Granulocytes: 0 %
Lymphocytes Relative: 32 %
Lymphs Abs: 2.4 10*3/uL (ref 0.7–4.0)
MCH: 43.5 pg — ABNORMAL HIGH (ref 26.0–34.0)
MCHC: 34.5 g/dL (ref 30.0–36.0)
MCV: 126.2 fL — ABNORMAL HIGH (ref 80.0–100.0)
Monocytes Absolute: 0.3 10*3/uL (ref 0.1–1.0)
Monocytes Relative: 3 %
Neutro Abs: 4.3 10*3/uL (ref 1.7–7.7)
Neutrophils Relative %: 57 %
Platelets: 377 10*3/uL (ref 150–400)
RBC: 2.71 MIL/uL — ABNORMAL LOW (ref 4.22–5.81)
RDW: 13.7 % (ref 11.5–15.5)
WBC: 7.4 10*3/uL (ref 4.0–10.5)
nRBC: 0 % (ref 0.0–0.2)

## 2021-03-07 LAB — BASIC METABOLIC PANEL
Anion gap: 6 (ref 5–15)
BUN: 22 mg/dL (ref 8–23)
CO2: 27 mmol/L (ref 22–32)
Calcium: 9.3 mg/dL (ref 8.9–10.3)
Chloride: 102 mmol/L (ref 98–111)
Creatinine, Ser: 1.5 mg/dL — ABNORMAL HIGH (ref 0.61–1.24)
GFR, Estimated: 52 mL/min — ABNORMAL LOW (ref >60)
Glucose, Bld: 158 mg/dL — ABNORMAL HIGH (ref 70–99)
Potassium: 5.1 mmol/L (ref 3.5–5.1)
Sodium: 135 mmol/L (ref 135–145)

## 2021-03-07 LAB — URINALYSIS, ROUTINE W REFLEX MICROSCOPIC
Bacteria, UA: NONE SEEN
Bilirubin Urine: NEGATIVE
Glucose, UA: NEGATIVE mg/dL
Hgb urine dipstick: NEGATIVE
Ketones, ur: NEGATIVE mg/dL
Leukocytes,Ua: NEGATIVE
Nitrite: NEGATIVE
Protein, ur: 100 mg/dL — AB
Specific Gravity, Urine: 1.02 (ref 1.005–1.030)
pH: 6 (ref 5.0–8.0)

## 2021-03-07 LAB — LACTIC ACID, PLASMA: Lactic Acid, Venous: 1.2 mmol/L (ref 0.5–1.9)

## 2021-03-07 MED ORDER — IOHEXOL 300 MG/ML  SOLN
100.0000 mL | Freq: Once | INTRAMUSCULAR | Status: AC | PRN
  Administered 2021-03-07: 100 mL via INTRAVENOUS

## 2021-03-07 MED ORDER — FENTANYL CITRATE (PF) 100 MCG/2ML IJ SOLN
50.0000 ug | INTRAMUSCULAR | Status: DC | PRN
Start: 2021-03-07 — End: 2021-03-07
  Administered 2021-03-07: 50 ug via INTRAVENOUS
  Filled 2021-03-07: qty 2

## 2021-03-07 NOTE — ED Triage Notes (Signed)
Pt to er, pt states that he has three hernias that he had surgery for in 2008, states that he is here because he thinks that the L inguinal hernia is back.  Pt states that he feels like the hernia doesn't go in and out anymore, states that it has been hurting for the past two weeks, but last night was worse.

## 2021-03-07 NOTE — ED Provider Notes (Signed)
[facility] EMERGENCY DEPARTMENT Provider Note   CSN: [id number] Arrival date & time: 03/07/21  1101     History Chief Complaint  Patient presents with  . Inguinal Hernia    Norman Peters is a 65 y.o. male.  Patient with history of multiple hernias and surgery 2008 presents with left inguinal pain worsening for the past 2 weeks.  Started after he lifted something heavier.  Patient denies any abdominal pain, vomiting, fevers or chills.  No urinary symptoms.  No kidney stone history.  Pain currently fairly constant.        Past Medical History:  Diagnosis Date  . Acute left-sided low back pain with left-sided sciatica 11/04/2020  . Anxiety   . Arthritis   . Depression 2001  . Diabetes mellitus 2010  . GERD (gastroesophageal reflux disease)   . Hyperlipidemia   . Hypertension 2005    Patient Active Problem List   Diagnosis Date Noted  . Seasonal allergies 09/12/2020  . Nasal congestion 09/12/2020  . Osteoarthritis of right knee 09/02/2020  . Type 2 diabetes mellitus with stage 3a chronic kidney disease, with long-term current use of insulin ([place]) 02/05/2020  . Vitamin D deficiency 01/11/2020  . Obesity (BMI 30.0-34.9) 09/03/2019  . Dental caries 09/03/2019  . Chronic elbow pain, left 06/23/2019  . Type 2 diabetes mellitus with hyperglycemia ([place]) 05/03/2019  . Chest pain with moderate risk of acute coronary syndrome 09/03/2018  . Mixed hyperlipidemia 05/29/2017  . Spasm of thoracic back muscle 10/06/2016  . Pain in joint involving multiple sites 02/12/2016  . GERD (gastroesophageal reflux disease) 02/12/2016  . Essential hypertension, benign 02/10/2016  . Anxiety and depression 02/10/2016    Past Surgical History:  Procedure Laterality Date  . COLONOSCOPY N/A 07/07/2016   Procedure: COLONOSCOPY;  Surgeon: Rogene Houston, MD;  Location: [facility];  Service: Endoscopy;  Laterality: N/A;  1030  . FINGER SURGERY Left 1974   fifth , trauma on job, had to  replace/repair finger  . HERNIA REPAIR  4081   x3, umbilical and bilateral hernia       Family History  Problem Relation Age of Onset  . Stroke Mother     Social History   Tobacco Use  . Smoking status: Former Smoker    Packs/day: 0.50    Years: 30.00    Pack years: 15.00    Types: Cigarettes    Quit date: 01/20/2011    Years since quitting: 10.1  . Smokeless tobacco: Never Used  Vaping Use  . Vaping Use: Never used  Substance Use Topics  . Alcohol use: No  . Drug use: No    Home Medications Prior to Admission medications   Medication Sig Start Date End Date Taking? Authorizing Provider  amLODipine (NORVASC) 10 MG tablet TAKE 1 TABLET EVERY DAY 01/29/21   Fayrene Helper, MD  aspirin EC 81 MG tablet Take 81 mg by mouth daily. 03/08/16   Fayrene Helper, MD  busPIRone (BUSPAR) 5 MG tablet TAKE 1 TABLET TWICE DAILY Patient taking differently: Take 5 mg by mouth 2 (two) times daily. 08/16/19   Fayrene Helper, MD  Cholecalciferol (VITAMIN D3) 400 units CAPS Take 2 capsules by mouth daily.    [provider]  ezetimibe (ZETIA) 10 MG tablet Take 1 tablet (10 mg total) by mouth daily. 12/11/20   Fayrene Helper, MD  FLUoxetine (PROZAC) 40 MG capsule Take 1 capsule (40 mg total) by mouth daily. 09/02/20   Tula Nakayama  E, MD  fluticasone (FLONASE) 50 MCG/ACT nasal spray Use 2 spray(s) in each nostril once daily 02/06/21   Noreene Larsson, NP  insulin glargine (LANTUS) 100 UNIT/ML Solostar Pen Inject 45 units under the skin every morning 09/02/20   Fayrene Helper, MD  Insulin Pen Needle 32G X 4 MM MISC Use daily to inject insulin DX e11.65 08/26/20   Fayrene Helper, MD  metFORMIN (GLUCOPHAGE) 500 MG tablet Take 1 tablet (500 mg total) by mouth 2 (two) times daily with a meal. 01/14/21   Fayrene Helper, MD  montelukast (SINGULAIR) 10 MG tablet Take 1 tablet (10 mg total) by mouth at bedtime. 12/11/20   Fayrene Helper, MD  Multiple  Vitamins-Minerals (MULTI-BETIC DIABETES) TABS Take 1 tablet by mouth daily. 01/11/20   Perlie Mayo, NP  rosuvastatin (CRESTOR) 5 MG tablet Take one tablet by mouth every Monday, Wednesday and Friday 09/02/20   Fayrene Helper, MD  spironolactone (ALDACTONE) 50 MG tablet TAKE 1 TABLET EVERY DAY 01/29/21   Fayrene Helper, MD  tadalafil (CIALIS) 10 MG tablet Take 1 tablet (10 mg total) by mouth daily as needed for erectile dysfunction. 03/28/19   Perlie Mayo, NP  TRUE METRIX BLOOD GLUCOSE TEST test strip TEST ONE TIME DAILY 01/29/21   Fayrene Helper, MD  TRUEplus Lancets 28G MISC USE  TO  TEST ONE TIME DAILY 10/30/20   Fayrene Helper, MD    Allergies    Ibuprofen, Lisinopril, Other, and Strawberry [berry]  Review of Systems   Review of Systems  Constitutional: Negative for chills and fever.  HENT: Negative for congestion.   Eyes: Negative for visual disturbance.  Respiratory: Negative for shortness of breath.   Cardiovascular: Negative for chest pain.  Gastrointestinal: Positive for abdominal pain. Negative for vomiting.  Genitourinary: Negative for dysuria and flank pain.  Musculoskeletal: Negative for back pain, neck pain and neck stiffness.  Skin: Negative for rash.  Neurological: Negative for light-headedness and headaches.    Physical Exam Updated Vital Signs BP 120/78   Pulse 88   Temp 98.8 F (37.1 C) (Oral)   Resp 16   Ht 5\' 8"  (1.727 m)   Wt 98.9 kg   SpO2 97%   BMI 33.15 kg/m   Physical Exam Vitals and nursing note reviewed.  Constitutional:      Appearance: He is well-developed.  HENT:     Head: Normocephalic and atraumatic.  Eyes:     General:        Right eye: No discharge.        Left eye: No discharge.     Conjunctiva/sclera: Conjunctivae normal.  Neck:     Trachea: No tracheal deviation.  Cardiovascular:     Rate and Rhythm: Normal rate and regular rhythm.  Pulmonary:     Effort: Pulmonary effort is normal.     Breath sounds:  Normal breath sounds.  Abdominal:     General: There is no distension.     Palpations: Abdomen is soft.     Tenderness: There is abdominal tenderness (left inguinal region, mild fullness, no testicular pain, no hernia appreciated in testicle however fullness groin, no external sign of infection). There is no guarding.  Musculoskeletal:     Cervical back: Normal range of motion and neck supple.  Skin:    General: Skin is warm.     Findings: No rash.  Neurological:     Mental Status: He is alert and oriented to person,  place, and time.  Psychiatric:        Mood and Affect: Mood normal.     ED Results / Procedures / Treatments   Labs (all labs ordered are listed, but only abnormal results are displayed) Labs Reviewed  BASIC METABOLIC PANEL - Abnormal; Notable for the following components:      Result Value   Glucose, Bld 158 (*)    Creatinine, Ser 1.50 (*)    GFR, Estimated 52 (*)    All other components within normal limits  CBC WITH DIFFERENTIAL/PLATELET - Abnormal; Notable for the following components:   RBC 2.71 (*)    Hemoglobin 11.8 (*)    HCT 34.2 (*)    MCV 126.2 (*)    MCH 43.5 (*)    All other components within normal limits  URINALYSIS, ROUTINE W REFLEX MICROSCOPIC - Abnormal; Notable for the following components:   Protein, ur 100 (*)    All other components within normal limits  LACTIC ACID, PLASMA    EKG None  Radiology CT ABDOMEN PELVIS W CONTRAST  Result Date: 03/07/2021 CLINICAL DATA:  Left groin pain, history of hernias with repair EXAM: CT ABDOMEN AND PELVIS WITH CONTRAST TECHNIQUE: Multidetector CT imaging of the abdomen and pelvis was performed using the standard protocol following bolus administration of intravenous contrast. CONTRAST:  114mL OMNIPAQUE IOHEXOL 300 MG/ML  SOLN COMPARISON:  10/30/2020 FINDINGS: Lower chest: No acute abnormality. Hepatobiliary: No focal liver abnormality is seen. No gallstones, gallbladder wall thickening, or biliary  dilatation. Pancreas: Unremarkable. Spleen: Unremarkable. Adrenals/Urinary Tract: Adrenals, kidneys, and bladder are unremarkable. Stomach/Bowel: Stomach is within normal limits. Bowel is normal in caliber. Distal colonic diverticulosis. Vascular/Lymphatic: Aortic atherosclerosis. No enlarged lymph nodes. Reproductive: Unremarkable. Other: Evidence of prior bilateral inguinal hernia repair. There is no recurrent hernia. No free fluid. Musculoskeletal: No acute osseous abnormality. IMPRESSION: No acute abnormality.  No evidence of recurrent hernia. Electronically Signed   By: Macy Mis M.D.   On: 03/07/2021 14:35    Procedures Procedures   Medications Ordered in ED Medications  fentaNYL (SUBLIMAZE) injection 50 mcg (50 mcg Intravenous Given 03/07/21 1245)  iohexol (OMNIPAQUE) 300 MG/ML solution 100 mL (100 mLs Intravenous Contrast Given 03/07/21 1415)    ED Course  I have reviewed the triage vital signs and the nursing notes.  Pertinent labs & imaging results that were available during my care of the patient were reviewed by me and considered in my medical decision making (see chart for details).    MDM Rules/Calculators/A&P                          Patient with hernia history and surgery history presents with worsening groin pain and fullness.  Discussed likely inguinal hernia, with worsening pain plan for CT scan to look for signs of strangulation, other differentials include muscle strain, lymph node related, kidney stone, other.  Blood work and urinalysis pending.  Pain meds ordered. CT scan results reviewed no acute abnormality, no significant hernia, no signs of infection, no kidney stone.  Blood work reviewed normal white blood cell count, hemoglobin close to normal 11.8, chronic renal failure creatinine 1.5.  Patient stable for outpatient follow-up.  Urinalysis reviewed no signs of infection or bleeding.    Final Clinical Impression(s) / ED Diagnoses Final diagnoses:  Left groin  pain    Rx / DC Orders ED Discharge Orders    None       Elnora Morrison, MD 03/07/21  1503  

## 2021-03-07 NOTE — Discharge Instructions (Addendum)
Use Tylenol every 4 hours as needed for pain.  Follow-up with your local doctor for further evaluation. The CT scan did not show a large hernia or other cause of your pain.  Likely muscle strain from lifting.

## 2021-03-16 ENCOUNTER — Encounter: Payer: Self-pay | Admitting: Nurse Practitioner

## 2021-03-16 ENCOUNTER — Ambulatory Visit (INDEPENDENT_AMBULATORY_CARE_PROVIDER_SITE_OTHER): Payer: Medicare HMO | Admitting: Nurse Practitioner

## 2021-03-16 ENCOUNTER — Other Ambulatory Visit: Payer: Self-pay

## 2021-03-16 VITALS — BP 143/88 | HR 90 | Ht 68.0 in | Wt 212.0 lb

## 2021-03-16 DIAGNOSIS — Z794 Long term (current) use of insulin: Secondary | ICD-10-CM | POA: Diagnosis not present

## 2021-03-16 DIAGNOSIS — E782 Mixed hyperlipidemia: Secondary | ICD-10-CM | POA: Diagnosis not present

## 2021-03-16 DIAGNOSIS — I10 Essential (primary) hypertension: Secondary | ICD-10-CM

## 2021-03-16 DIAGNOSIS — N1831 Chronic kidney disease, stage 3a: Secondary | ICD-10-CM | POA: Diagnosis not present

## 2021-03-16 DIAGNOSIS — E1122 Type 2 diabetes mellitus with diabetic chronic kidney disease: Secondary | ICD-10-CM

## 2021-03-16 LAB — POCT GLYCOSYLATED HEMOGLOBIN (HGB A1C): Hemoglobin A1C: 8.5 % — AB (ref 4.0–5.6)

## 2021-03-16 LAB — POCT UA - MICROALBUMIN
Creatinine, POC: 300 mg/dL
Microalbumin Ur, POC: 150 mg/L

## 2021-03-16 NOTE — Patient Instructions (Signed)

## 2021-03-16 NOTE — Progress Notes (Signed)
Endocrinology follow-up note    03/16/2021, 3:01 PM   Subjective:    Patient ID: Norman Peters, male    DOB: 1956-05-11.  Norman Peters is being seen in follow-up after he was seen in consultation for management of currently uncontrolled symptomatic diabetes requested by  Fayrene Helper, MD.   Past Medical History:  Diagnosis Date  . Acute left-sided low back pain with left-sided sciatica 11/04/2020  . Anxiety   . Arthritis   . Depression 2001  . Diabetes mellitus 2010  . GERD (gastroesophageal reflux disease)   . Hyperlipidemia   . Hypertension 2005    Past Surgical History:  Procedure Laterality Date  . COLONOSCOPY N/A 07/07/2016   Procedure: COLONOSCOPY;  Surgeon: Rogene Houston, MD;  Location: [facility];  Service: Endoscopy;  Laterality: N/A;  1030  . FINGER SURGERY Left 1974   fifth , trauma on job, had to replace/repair finger  . HERNIA REPAIR  7253   x3, umbilical and bilateral hernia    Social History   Socioeconomic History  . Marital status: Married    Spouse name: Not on file  . Number of children: Not on file  . Years of education: Not on file  . Highest education level: Not on file  Occupational History  . Not on file  Tobacco Use  . Smoking status: Former Smoker    Packs/day: 0.50    Years: 30.00    Pack years: 15.00    Types: Cigarettes    Quit date: 01/20/2011    Years since quitting: 10.1  . Smokeless tobacco: Never Used  Vaping Use  . Vaping Use: Never used  Substance and Sexual Activity  . Alcohol use: No  . Drug use: No  . Sexual activity: Not Currently  Other Topics Concern  . Not on file  Social History Narrative  . Not on file   Social Determinants of Health   Financial Resource Strain: Low Risk   . Difficulty of Paying Living Expenses: Not very hard  Food Insecurity: No Food Insecurity  . Worried About Charity fundraiser in the Last Year:  Never true  . Ran Out of Food in the Last Year: Never true  Transportation Needs: No Transportation Needs  . Lack of Transportation (Medical): No  . Lack of Transportation (Non-Medical): No  Physical Activity: Insufficiently Active  . Days of Exercise per Week: 1 day  . Minutes of Exercise per Session: 30 min  Stress: No Stress Concern Present  . Feeling of Stress : Only a little  Social Connections: Moderately Integrated  . Frequency of Communication with Friends and Family: Three times a week  . Frequency of Social Gatherings with Friends and Family: Three times a week  . Attends Religious Services: 1 to 4 times per year  . Active Member of Clubs or Organizations: No  . Attends Archivist Meetings: Never  . Marital Status: Married    Family History  Problem Relation Age of Onset  . Stroke Mother     Outpatient Encounter Medications as of 03/16/2021  Medication Sig  . amLODipine (NORVASC) 10  MG tablet TAKE 1 TABLET EVERY DAY  . aspirin EC 81 MG tablet Take 81 mg by mouth daily.  . busPIRone (BUSPAR) 5 MG tablet TAKE 1 TABLET TWICE DAILY (Patient taking differently: Take 5 mg by mouth 2 (two) times daily.)  . Cholecalciferol (VITAMIN D3) 400 units CAPS Take 2 capsules by mouth daily.  Marland Kitchen ezetimibe (ZETIA) 10 MG tablet Take 1 tablet (10 mg total) by mouth daily.  Marland Kitchen FLUoxetine (PROZAC) 40 MG capsule Take 1 capsule (40 mg total) by mouth daily.  . fluticasone (FLONASE) 50 MCG/ACT nasal spray Use 2 spray(s) in each nostril once daily  . insulin glargine (LANTUS) 100 UNIT/ML Solostar Pen Inject 45 units under the skin every morning  . Insulin Pen Needle 32G X 4 MM MISC Use daily to inject insulin DX e11.65  . montelukast (SINGULAIR) 10 MG tablet Take 1 tablet (10 mg total) by mouth at bedtime.  . Multiple Vitamins-Minerals (MULTI-BETIC DIABETES) TABS Take 1 tablet by mouth daily.  . rosuvastatin (CRESTOR) 5 MG tablet Take one tablet by mouth every Monday, Wednesday and Friday   . spironolactone (ALDACTONE) 50 MG tablet TAKE 1 TABLET EVERY DAY  . tadalafil (CIALIS) 10 MG tablet Take 1 tablet (10 mg total) by mouth daily as needed for erectile dysfunction.  . TRUE METRIX BLOOD GLUCOSE TEST test strip TEST ONE TIME DAILY  . TRUEplus Lancets 28G MISC USE  TO  TEST ONE TIME DAILY  . [DISCONTINUED] metFORMIN (GLUCOPHAGE) 500 MG tablet Take 1 tablet (500 mg total) by mouth 2 (two) times daily with a meal. (Patient not taking: Reported on 03/16/2021)   No facility-administered encounter medications on file as of 03/16/2021.    ALLERGIES: Allergies  Allergen Reactions  . Ibuprofen Rash  . Lisinopril Cough  . Other Other (See Comments)  . Strawberry [Berry]     VACCINATION STATUS: Immunization History  Administered Date(s) Administered  . Fluad Quad(high Dose 65+) 09/02/2020  . Influenza,inj,Quad PF,6+ Mos 07/14/2016, 08/25/2017, 08/30/2018, 07/02/2019  . PFIZER(Purple Top)SARS-COV-2 Vaccination 01/25/2020, 02/19/2020, 09/28/2020  . Pneumococcal Polysaccharide-23 02/10/2016    Diabetes He presents for his follow-up diabetic visit. He has type 2 diabetes mellitus. Onset time: He was diagnosed at approximate age of 26 years. His disease course has been improving. There are no hypoglycemic associated symptoms. Pertinent negatives for hypoglycemia include no confusion, headaches, pallor or seizures. Pertinent negatives for diabetes include no chest pain, no fatigue, no polydipsia, no polyphagia, no polyuria and no weakness. There are no hypoglycemic complications. Symptoms are improving. Diabetic complications include nephropathy and peripheral neuropathy. Risk factors for coronary artery disease include diabetes mellitus, dyslipidemia, obesity, hypertension, male sex, tobacco exposure and sedentary lifestyle. Current diabetic treatment includes insulin injections. He is compliant with treatment most of the time. His weight is decreasing steadily. He is following a generally  unhealthy diet. When asked about meal planning, he reported none. He has had a previous visit with a dietitian. He rarely participates in exercise. His overall blood glucose range is >200 mg/dl. (He presents today with his meter and logs showing inconsistent monitoring pattern.  Analysis of his meter shows 30-day average of 209 and 90-day average of 220 (no readings for the last 14-days).  His POCT A1c today is 8.7%, improving from last visit of 9.9%.  He reports he stopped taking his Metformin about 3 weeks ago due to coughing after taking his medication (which similarly happened with his Glipizide).  Since stopping the medication, his coughing has stopped.  ) An  ACE inhibitor/angiotensin II receptor blocker is not being taken. He does not see a podiatrist.Eye exam is current.  Hyperlipidemia This is a chronic problem. The current episode started more than 1 year ago. The problem is uncontrolled. Recent lipid tests were reviewed and are variable. Exacerbating diseases include chronic renal disease, diabetes and obesity. Factors aggravating his hyperlipidemia include fatty foods. Pertinent negatives include no chest pain, myalgias or shortness of breath. Current antihyperlipidemic treatment includes statins. The current treatment provides mild improvement of lipids. Compliance problems include adherence to exercise and adherence to diet.  Risk factors for coronary artery disease include diabetes mellitus, dyslipidemia, male sex, obesity, hypertension and a sedentary lifestyle.  Hypertension This is a chronic problem. The current episode started more than 1 year ago. The problem is unchanged. The problem is controlled. Pertinent negatives include no chest pain, headaches, neck pain, palpitations or shortness of breath. There are no associated agents to hypertension. Risk factors for coronary artery disease include diabetes mellitus, male gender, smoking/tobacco exposure, sedentary lifestyle, obesity and  dyslipidemia. Past treatments include calcium channel blockers and diuretics. The current treatment provides mild improvement. There are no compliance problems.  Hypertensive end-organ damage includes kidney disease. Identifiable causes of hypertension include chronic renal disease.    Review of systems  Constitutional: + Minimally fluctuating body weight,  current Body mass index is 32.23 kg/m. , no fatigue, no subjective hyperthermia, no subjective hypothermia Eyes: no blurry vision, no xerophthalmia ENT: no sore throat, no nodules palpated in throat, no dysphagia/odynophagia, no hoarseness Cardiovascular: no chest pain, no shortness of breath, no palpitations, no leg swelling Respiratory: no cough, no shortness of breath Gastrointestinal: no nausea/vomiting/diarrhea Musculoskeletal: no muscle/joint aches Skin: no rashes, no hyperemia Neurological: no tremors, + numbness/tingling to LLE, no dizziness Psychiatric: no depression, no anxiety   Objective:    BP (!) 143/88   Pulse 90   Ht 5\' 8"  (1.727 m)   Wt 212 lb (96.2 kg)   BMI 32.23 kg/m   Wt Readings from Last 3 Encounters:  03/16/21 212 lb (96.2 kg)  03/07/21 218 lb (98.9 kg)  12/17/20 219 lb 3.2 oz (99.4 kg)    BP Readings from Last 3 Encounters:  03/16/21 (!) 143/88  03/07/21 128/71  12/17/20 140/90     Physical Exam- Limited  Constitutional:  Body mass index is 32.23 kg/m. , not in acute distress, normal state of mind Eyes:  EOMI, no exophthalmos Neck: Supple Cardiovascular: RRR, no murmers, rubs, or gallops, no edema Respiratory: Adequate breathing efforts, no crackles, rales, rhonchi, or wheezing Musculoskeletal: no gross deformities, strength intact in all four extremities, no gross restriction of joint movements Skin:  no rashes, no hyperemia Neurological: no tremor with outstretched hands      CMP     Component Value Date/Time   NA 135 03/07/2021 1215   NA 138 12/10/2020 0933   K 5.1 03/07/2021  1215   CL 102 03/07/2021 1215   CO2 27 03/07/2021 1215   GLUCOSE 158 (H) 03/07/2021 1215   BUN 22 03/07/2021 1215   BUN 15 12/10/2020 0933   CREATININE 1.50 (H) 03/07/2021 1215   CREATININE 1.47 (H) 08/25/2020 0927   CALCIUM 9.3 03/07/2021 1215   PROT 7.5 12/10/2020 0933   ALBUMIN 4.3 12/10/2020 0933   AST 20 12/10/2020 0933   ALT 20 12/10/2020 0933   ALKPHOS 77 12/10/2020 0933   BILITOT 1.0 12/10/2020 0933   GFRNONAA 52 (L) 03/07/2021 1215   GFRNONAA 50 (L) 08/25/2020 [id number]  GFRAA 61 12/10/2020 0933   GFRAA 58 (L) 08/25/2020 0927     Diabetic Labs (most recent): Lab Results  Component Value Date   HGBA1C 8.5 (A) 03/16/2021   HGBA1C 9.9 (H) 12/12/2020   HGBA1C 8.9 (A) 09/02/2020     Lipid Panel     Component Value Date/Time   CHOL 120 12/10/2020 0933   TRIG 205 (H) 12/10/2020 0933   HDL 37 (L) 12/10/2020 0933   CHOLHDL 3.2 12/10/2020 0933   CHOLHDL 3.4 08/25/2020 0927   VLDL 39 (H) 05/14/2017 0831   LDLCALC 50 12/10/2020 0933   LDLCALC 55 08/25/2020 0927   LABVLDL 33 12/10/2020 0933      Lab Results  Component Value Date   TSH 2.79 08/25/2020   TSH 1.96 08/24/2019   TSH 1.34 01/31/2018   TSH 1.61 02/10/2016   FREET4 1.0 08/25/2020     Assessment & Plan:   1) Type 2 diabetes mellitus with stage 3a chronic kidney disease, with long-term current use of insulin ([place])  - Norman Peters has currently uncontrolled symptomatic type 2 DM since  65 years of age.  He presents today with his meter and logs showing inconsistent monitoring pattern.  Analysis of his meter shows 30-day average of 209 and 90-day average of 220 (no readings for the last 14-days).  His POCT A1c today is 8.7%, improving from last visit of 9.9%.  He reports he stopped taking his Metformin about 3 weeks ago due to coughing after taking his medication (which similarly happened with his Glipizide).  Since stopping the medication, his coughing has stopped.    Recent labs reviewed.  - I had a long  discussion with him about the progressive nature of diabetes and the pathology behind its complications. -his diabetes is complicated by CKD and he remains at a high risk for more acute and chronic complications which include CAD, CVA, CKD, retinopathy, and neuropathy. These are all discussed in detail with him.  - Nutritional counseling repeated at each appointment due to patients tendency to fall back in to old habits.  - The patient admits there is a room for improvement in their diet and drink choices. -  Suggestion is made for the patient to avoid simple carbohydrates from their diet including Cakes, Sweet Desserts / Pastries, Ice Cream, Soda (diet and regular), Sweet Tea, Candies, Chips, Cookies, Sweet Pastries, Store Bought Juices, Alcohol in Excess of 1-2 drinks a day, Artificial Sweeteners, Coffee Creamer, and "Sugar-free" Products. This will help patient to have stable blood glucose profile and potentially avoid unintended weight gain.   - I encouraged the patient to switch to unprocessed or minimally processed complex starch and increased protein intake (animal or plant source), fruits, and vegetables.   - Patient is advised to stick to a routine mealtimes to eat 3 meals a day and avoid unnecessary snacks (to snack only to correct hypoglycemia).  - I have approached him with the following individualized plan to manage his diabetes and patient agrees:   - he will continue to benefit from basal insulin.    -Due to lack of logs to review, he is advised to continue Lantus 45 units SQ nightly.  Given decline in renal function and his coughing, he is advised to stay off Metformin for now.    -He is encouraged to continue monitoring blood glucose twice daily, before breakfast and before bed, and to call the clinic if he has readings less than 70 or greater than 200 for 3 tests  in a row.  - he is warned not to take insulin without proper monitoring per orders.  -He reports that he had  developed unexplained cough while taking Glipizide and Metformin.  After he finished his Glipizide and Metformin, reportedly the cough has improved.  He is advised to stay off of Glipizide and Metformin for now.   - he will be considered for incretin therapy as appropriate next visit.  - Specific targets for  A1c;  LDL, HDL,  and Triglycerides were discussed with the patient.  2) Blood Pressure /Hypertension:  His blood pressure is controlled to target.  he is advised to continue his current medications including Amlodipine 10 mg p.o. daily with breakfast and Spironolactone 50 mg po daily.  3) Lipids/Hyperlipidemia:   His most recent lipid panel from 12/10/20 shows controlled LDL of 50 and elevated triglycerides of 205.  He is advised to continue Crestor 5 mg po daily at bedtime.  Side effects and precautions discussed with him.   4)  Weight/Diet:  His Body mass index is 32.23 kg/m.  -   clearly complicating his diabetes care.   he is a candidate for weight loss. I discussed with him the fact that loss of 5 - 10% of his  current body weight will have the most impact on his diabetes management.  Exercise, and detailed carbohydrates information provided  -  detailed on discharge instructions.  5) Chronic Care/Health Maintenance: -he is on a Statin medications and is encouraged to initiate and continue to follow up with Ophthalmology, Dentist, Podiatrist at least yearly or according to recommendations, and advised to stay away from smoking. I have recommended yearly flu vaccine and pneumonia vaccine at least every 5 years; moderate intensity exercise for up to 150 minutes weekly; and  sleep for at least 7 hours a day.  - he is advised to maintain close follow up with Fayrene Helper, MD for primary care needs, as well as his other providers for optimal and coordinated care.    I spent 30 minutes in the care of the patient today including review of labs from [place], Lipids, Thyroid Function,  Hematology (current and previous including abstractions from other facilities); face-to-face time discussing  his blood glucose readings/logs, discussing hypoglycemia and hyperglycemia episodes and symptoms, medications doses, his options of short and long term treatment based on the latest standards of care / guidelines;  discussion about incorporating lifestyle medicine;  and documenting the encounter.    Please refer to Patient Instructions for Blood Glucose Monitoring and Insulin/Medications Dosing Guide"  in media tab for additional information. Please  also refer to " Patient Self Inventory" in the Media  tab for reviewed elements of pertinent patient history.  Norman Peters participated in the discussions, expressed understanding, and voiced agreement with the above plans.  All questions were answered to his satisfaction. he is encouraged to contact clinic should he have any questions or concerns prior to his return visit.   Follow up plan: - Return in about 3 months (around 06/16/2021) for Diabetes F/U with A1c in office, No previsit labs, Bring meter and logs.  Rayetta Pigg, [facility] [address] [place], [place] [address] Phone: [phone] Fax: [phone]   03/16/2021, 3:01 PM

## 2021-04-03 ENCOUNTER — Other Ambulatory Visit: Payer: Self-pay

## 2021-04-03 ENCOUNTER — Telehealth (INDEPENDENT_AMBULATORY_CARE_PROVIDER_SITE_OTHER): Payer: Medicare HMO

## 2021-04-03 VITALS — Ht 68.0 in | Wt 214.0 lb

## 2021-04-03 DIAGNOSIS — Z0000 Encounter for general adult medical examination without abnormal findings: Secondary | ICD-10-CM

## 2021-04-03 NOTE — Progress Notes (Signed)
Subjective:   Norman Peters is a 65 y.o. male who presents for Medicare Annual/Subsequent preventive examination.  Review of Systems       Objective:    There were no vitals filed for this visit. There is no height or weight on file to calculate BMI.  Advanced Directives 03/07/2021 10/30/2020 07/07/2017 05/25/2017 07/07/2016  Does Patient Have a Medical Advance Directive? No No No No No  Would patient like information on creating a medical advance directive? - - - Yes (Inpatient - patient defers creating a medical advance directive at this time) No - patient declined information    Current Medications (verified) Outpatient Encounter Medications as of 04/03/2021  Medication Sig  . amLODipine (NORVASC) 10 MG tablet TAKE 1 TABLET EVERY DAY  . aspirin EC 81 MG tablet Take 81 mg by mouth daily.  . busPIRone (BUSPAR) 5 MG tablet TAKE 1 TABLET TWICE DAILY (Patient taking differently: Take 5 mg by mouth 2 (two) times daily.)  . Cholecalciferol (VITAMIN D3) 400 units CAPS Take 2 capsules by mouth daily.  Marland Kitchen ezetimibe (ZETIA) 10 MG tablet Take 1 tablet (10 mg total) by mouth daily.  Marland Kitchen FLUoxetine (PROZAC) 40 MG capsule Take 1 capsule (40 mg total) by mouth daily.  . fluticasone (FLONASE) 50 MCG/ACT nasal spray Use 2 spray(s) in each nostril once daily  . insulin glargine (LANTUS) 100 UNIT/ML Solostar Pen Inject 45 units under the skin every morning  . Insulin Pen Needle 32G X 4 MM MISC Use daily to inject insulin DX e11.65  . montelukast (SINGULAIR) 10 MG tablet Take 1 tablet (10 mg total) by mouth at bedtime.  . Multiple Vitamins-Minerals (MULTI-BETIC DIABETES) TABS Take 1 tablet by mouth daily.  . rosuvastatin (CRESTOR) 5 MG tablet Take one tablet by mouth every Monday, Wednesday and Friday  . spironolactone (ALDACTONE) 50 MG tablet TAKE 1 TABLET EVERY DAY  . tadalafil (CIALIS) 10 MG tablet Take 1 tablet (10 mg total) by mouth daily as needed for erectile dysfunction.  . TRUE METRIX BLOOD  GLUCOSE TEST test strip TEST ONE TIME DAILY  . TRUEplus Lancets 28G MISC USE  TO  TEST ONE TIME DAILY   No facility-administered encounter medications on file as of 04/03/2021.    Allergies (verified) Ibuprofen, Lisinopril, Other, and Strawberry [berry]   History: Past Medical History:  Diagnosis Date  . Acute left-sided low back pain with left-sided sciatica 11/04/2020  . Anxiety   . Arthritis   . Depression 2001  . Diabetes mellitus 2010  . GERD (gastroesophageal reflux disease)   . Hyperlipidemia   . Hypertension 2005   Past Surgical History:  Procedure Laterality Date  . COLONOSCOPY N/A 07/07/2016   Procedure: COLONOSCOPY;  Surgeon: Rogene Houston, MD;  Location: [facility];  Service: Endoscopy;  Laterality: N/A;  1030  . FINGER SURGERY Left 1974   fifth , trauma on job, had to replace/repair finger  . HERNIA REPAIR  1610   x3, umbilical and bilateral hernia   Family History  Problem Relation Age of Onset  . Stroke Mother    Social History   Socioeconomic History  . Marital status: Married    Spouse name: Not on file  . Number of children: Not on file  . Years of education: Not on file  . Highest education level: Not on file  Occupational History  . Not on file  Tobacco Use  . Smoking status: Former Smoker    Packs/day: 0.50    Years:  30.00    Pack years: 15.00    Types: Cigarettes    Quit date: 01/20/2011    Years since quitting: 10.2  . Smokeless tobacco: Never Used  Vaping Use  . Vaping Use: Never used  Substance and Sexual Activity  . Alcohol use: No  . Drug use: No  . Sexual activity: Not Currently  Other Topics Concern  . Not on file  Social History Narrative  . Not on file   Social Determinants of Health   Financial Resource Strain: Not on file  Food Insecurity: Not on file  Transportation Needs: Not on file  Physical Activity: Not on file  Stress: Not on file  Social Connections: Not on file    Tobacco Counseling Counseling  given: Not Answered   Clinical Intake:                 Diabetic? yes         Activities of Daily Living No flowsheet data found.  Patient Care Team: Fayrene Helper, MD as PCP - General (Family Medicine) Satira Sark, MD as PCP - Cardiology (Cardiology)  Indicate any recent Medical Services you may have received from other than [facility] providers in the past year (date may be approximate).     Assessment:   This is a routine wellness examination for Norman Peters.  Hearing/Vision screen No exam data present  Dietary issues and exercise activities discussed:    Goals Addressed   None    Depression Screen PHQ 2/9 Scores 12/11/2020 11/04/2020 09/12/2020 09/02/2020 04/01/2020 01/11/2020 01/11/2020  PHQ - 2 Score 0 1 0 5 0 1 1  PHQ- 9 Score - 4 2 12  - 5 -    Fall Risk Fall Risk  03/16/2021 12/11/2020 11/04/2020 09/12/2020 09/02/2020  Falls in the past year? 0 1 0 0 0  Number falls in past yr: - 0 0 0 0  Injury with Fall? - 0 0 0 0  Risk for fall due to : Impaired balance/gait;Impaired mobility - No Fall Risks No Fall Risks -  Follow up Falls evaluation completed - Falls evaluation completed Falls evaluation completed -    FALL RISK PREVENTION PERTAINING TO THE HOME:  Any stairs in or around the home? No  If so, are there any without handrails? n/a Home free of loose throw rugs in walkways, pet beds, electrical cords, etc? Yes  Adequate lighting in your home to reduce risk of falls? Yes   ASSISTIVE DEVICES UTILIZED TO PREVENT FALLS:  Life alert? No  Use of a cane, walker or w/c? Yes  Grab bars in the bathroom? Yes  Shower chair or bench in shower? No  Elevated toilet seat or a handicapped toilet? Yes   TIMED UP AND GO:  Was the test performed? No .  Length of time to ambulate n/a   Cognitive Function:     6CIT Screen 04/01/2020 03/28/2019  What Year? 0 points 0 points  What month? 0 points 0 points  What time? 0 points 0 points  Count back from 20 0  points 0 points  Months in reverse 4 points 0 points  Repeat phrase 0 points 0 points  Total Score 4 0    Immunizations Immunization History  Administered Date(s) Administered  . Fluad Quad(high Dose 65+) 09/02/2020  . Influenza,inj,Quad PF,6+ Mos 07/14/2016, 08/25/2017, 08/30/2018, 07/02/2019  . PFIZER(Purple Top)SARS-COV-2 Vaccination 01/25/2020, 02/19/2020, 09/28/2020  . Pneumococcal Polysaccharide-23 02/10/2016    TDAP status: Up to date  Flu Vaccine status:  Up to date  Pneumococcal vaccine status: Declined,  Education has been provided regarding the importance of this vaccine but patient still declined. Advised may receive this vaccine at local pharmacy or Health Dept. Aware to provide a copy of the vaccination record if obtained from local pharmacy or Health Dept. Verbalized acceptance and understanding.   Covid-19 vaccine status: Completed vaccines  Qualifies for Shingles Vaccine? Yes   Zostavax completed No   Shingrix Completed?: No.    Education has been provided regarding the importance of this vaccine. Patient has been advised to call insurance company to determine out of pocket expense if they have not yet received this vaccine. Advised may also receive vaccine at local pharmacy or Health Dept. Verbalized acceptance and understanding.  Screening Tests Health Maintenance  Topic Date Due  . Zoster Vaccines- Shingrix (1 of 2) Never done  . OPHTHALMOLOGY EXAM  12/30/2020  . INFLUENZA VACCINE  06/08/2021  . FOOT EXAM  09/02/2021  . HEMOGLOBIN A1C  09/16/2021  . URINE MICROALBUMIN  03/16/2022  . TETANUS/TDAP  01/24/2024  . COLONOSCOPY (Pts 45-27yrs Insurance coverage will need to be confirmed)  07/07/2026  . COVID-19 Vaccine  Completed  . Hepatitis C Screening  Completed  . HIV Screening  Completed  . HPV VACCINES  Aged Out    Health Maintenance  Health Maintenance Due  Topic Date Due  . Zoster Vaccines- Shingrix (1 of 2) Never done  . OPHTHALMOLOGY EXAM   12/30/2020    Colorectal cancer screening: Type of screening: Colonoscopy. Completed 07/07/2016. Repeat every 10 years  Lung Cancer Screening: (Low Dose CT Chest recommended if Age 79-80 years, 30 pack-year currently smoking OR have quit w/in 15years.) does not qualify.   Lung Cancer Screening Referral: n/a  Additional Screening:  Hepatitis C Screening: does not qualify; Completed  Vision Screening: Recommended annual ophthalmology exams for early detection of glaucoma and other disorders of the eye. Is the patient up to date with their annual eye exam?  Yes  Who is the provider or what is the name of the office in which the patient attends annual eye exams? [facility] If pt is not established with a provider, would they like to be referred to a provider to establish care? n/a.   Dental Screening: Recommended annual dental exams for proper oral hygiene  Community Resource Referral / Chronic Care Management: CRR required this visit?  No   CCM required this visit?  No      Plan:     I have personally reviewed and noted the following in the patient's chart:   . Medical and social history . Use of alcohol, tobacco or illicit drugs  . Current medications and supplements including opioid prescriptions. Patient is not currently taking opioid prescriptions. . Functional ability and status . Nutritional status . Physical activity . Advanced directives . List of other physicians . Hospitalizations, surgeries, and ER visits in previous 12 months . Vitals . Screenings to include cognitive, depression, and falls . Referrals and appointments  In addition, I have reviewed and discussed with patient certain preventive protocols, quality metrics, and best practice recommendations. A written personalized care plan for preventive services as well as general preventive health recommendations were provided to patient.     Laretta Bolster, [place]   7/78/2423   Nurse Notes: AWV conducted by  nurse in office by phone. Patient gave consent to telehealth visit via audio. Patient at home at the time of this visit. Provider out of the office today  at the time of this visit.

## 2021-04-03 NOTE — Patient Instructions (Addendum)
Norman Peters , Thank you for taking time to come for your Medicare Wellness Visit. I appreciate your ongoing commitment to your health goals. Please review the following plan we discussed and let me know if I can assist you in the future.   Screening recommendations/referrals: Colonoscopy: 07/07/26 Recommended yearly ophthalmology/optometry visit for glaucoma screening and checkup Recommended yearly dental visit for hygiene and checkup  Vaccinations: Influenza vaccine: Fall 2022 Pneumococcal vaccine: Declined Tdap vaccine: 01/24/24 Shingles vaccine: Declined  Advanced directives: No  Conditions/risks identified: None  Next appointment: No future appointments at this time.  Preventive Care 40-64 Years, Male Preventive care refers to lifestyle choices and visits with your health care provider that can promote health and wellness. What does preventive care include?  A yearly physical exam. This is also called an annual well check.  Dental exams once or twice a year.  Routine eye exams. Ask your health care provider how often you should have your eyes checked.  Personal lifestyle choices, including:  Daily care of your teeth and gums.  Regular physical activity.  Eating a healthy diet.  Avoiding tobacco and drug use.  Limiting alcohol use.  Practicing safe sex.  Taking low-dose aspirin every day starting at age 73. What happens during an annual well check? The services and screenings done by your health care provider during your annual well check will depend on your age, overall health, lifestyle risk factors, and family history of disease. Counseling  Your health care provider may ask you questions about your:  Alcohol use.  Tobacco use.  Drug use.  Emotional well-being.  Home and relationship well-being.  Sexual activity.  Eating habits.  Work and work Statistician. Screening  You may have the following tests or measurements:  Height, weight, and  BMI.  Blood pressure.  Lipid and cholesterol levels. These may be checked every 5 years, or more frequently if you are over 89 years old.  Skin check.  Lung cancer screening. You may have this screening every year starting at age 56 if you have a 30-pack-year history of smoking and currently smoke or have quit within the past 15 years.  Fecal occult blood test (FOBT) of the stool. You may have this test every year starting at age 32.  Flexible sigmoidoscopy or colonoscopy. You may have a sigmoidoscopy every 5 years or a colonoscopy every 10 years starting at age 71.  Prostate cancer screening. Recommendations will vary depending on your family history and other risks.  Hepatitis C blood test.  Hepatitis B blood test.  Sexually transmitted disease (STD) testing.  Diabetes screening. This is done by checking your blood sugar (glucose) after you have not eaten for a while (fasting). You may have this done every 1-3 years. Discuss your test results, treatment options, and if necessary, the need for more tests with your health care provider. Vaccines  Your health care provider may recommend certain vaccines, such as:  Influenza vaccine. This is recommended every year.  Tetanus, diphtheria, and acellular pertussis (Tdap, Td) vaccine. You may need a Td booster every 10 years.  Zoster vaccine. You may need this after age 64.  Pneumococcal 13-valent conjugate (PCV13) vaccine. You may need this if you have certain conditions and have not been vaccinated.  Pneumococcal polysaccharide (PPSV23) vaccine. You may need one or two doses if you smoke cigarettes or if you have certain conditions. Talk to your health care provider about which screenings and vaccines you need and how often you need them. This information  is not intended to replace advice given to you by your health care provider. Make sure you discuss any questions you have with your health care provider. Document Released:  11/21/2015 Document Revised: 07/14/2016 Document Reviewed: 08/26/2015 Elsevier Interactive Patient Education  2017 [place] Prevention in the Home Falls can cause injuries. They can happen to people of all ages. There are many things you can do to make your home safe and to help prevent falls. What can I do on the outside of my home?  Regularly fix the edges of walkways and driveways and fix any cracks.  Remove anything that might make you trip as you walk through a door, such as a raised step or threshold.  Trim any bushes or trees on the path to your home.  Use bright outdoor lighting.  Clear any walking paths of anything that might make someone trip, such as rocks or tools.  Regularly check to see if handrails are loose or broken. Make sure that both sides of any steps have handrails.  Any raised decks and porches should have guardrails on the edges.  Have any leaves, snow, or ice cleared regularly.  Use sand or salt on walking paths during winter.  Clean up any spills in your garage right away. This includes oil or grease spills. What can I do in the bathroom?  Use night lights.  Install grab bars by the toilet and in the tub and shower. Do not use towel bars as grab bars.  Use non-skid mats or decals in the tub or shower.  If you need to sit down in the shower, use a plastic, non-slip stool.  Keep the floor dry. Clean up any water that spills on the floor as soon as it happens.  Remove soap buildup in the tub or shower regularly.  Attach bath mats securely with double-sided non-slip rug tape.  Do not have throw rugs and other things on the floor that can make you trip. What can I do in the bedroom?  Use night lights.  Make sure that you have a light by your bed that is easy to reach.  Do not use any sheets or blankets that are too big for your bed. They should not hang down onto the floor.  Have a firm chair that has side arms. You can use this for  support while you get dressed.  Do not have throw rugs and other things on the floor that can make you trip. What can I do in the kitchen?  Clean up any spills right away.  Avoid walking on wet floors.  Keep items that you use a lot in easy-to-reach places.  If you need to reach something above you, use a strong step stool that has a grab bar.  Keep electrical cords out of the way.  Do not use floor polish or wax that makes floors slippery. If you must use wax, use non-skid floor wax.  Do not have throw rugs and other things on the floor that can make you trip. What can I do with my stairs?  Do not leave any items on the stairs.  Make sure that there are handrails on both sides of the stairs and use them. Fix handrails that are broken or loose. Make sure that handrails are as long as the stairways.  Check any carpeting to make sure that it is firmly attached to the stairs. Fix any carpet that is loose or worn.  Avoid having throw rugs  at the top or bottom of the stairs. If you do have throw rugs, attach them to the floor with carpet tape.  Make sure that you have a light switch at the top of the stairs and the bottom of the stairs. If you do not have them, ask someone to add them for you. What else can I do to help prevent falls?  Wear shoes that:  Do not have high heels.  Have rubber bottoms.  Are comfortable and fit you well.  Are closed at the toe. Do not wear sandals.  If you use a stepladder:  Make sure that it is fully opened. Do not climb a closed stepladder.  Make sure that both sides of the stepladder are locked into place.  Ask someone to hold it for you, if possible.  Clearly mark and make sure that you can see:  Any grab bars or handrails.  First and last steps.  Where the edge of each step is.  Use tools that help you move around (mobility aids) if they are needed. These include:  Canes.  Walkers.  Scooters.  Crutches.  Turn on the  lights when you go into a dark area. Replace any light bulbs as soon as they burn out.  Set up your furniture so you have a clear path. Avoid moving your furniture around.  If any of your floors are uneven, fix them.  If there are any pets around you, be aware of where they are.  Review your medicines with your doctor. Some medicines can make you feel dizzy. This can increase your chance of falling. Ask your doctor what other things that you can do to help prevent falls. This information is not intended to replace advice given to you by your health care provider. Make sure you discuss any questions you have with your health care provider. Document Released: 08/21/2009 Document Revised: 04/01/2016 Document Reviewed: 11/29/2014 Elsevier Interactive Patient Education  2017 [employer].

## 2021-04-13 ENCOUNTER — Other Ambulatory Visit: Payer: Self-pay

## 2021-04-13 ENCOUNTER — Ambulatory Visit (INDEPENDENT_AMBULATORY_CARE_PROVIDER_SITE_OTHER): Payer: Medicare HMO

## 2021-04-13 ENCOUNTER — Ambulatory Visit
Admission: EM | Admit: 2021-04-13 | Discharge: 2021-04-13 | Disposition: A | Discharge status: Home / Self Care | Payer: Medicare HMO | Attending: Family Medicine | Admitting: Family Medicine

## 2021-04-13 DIAGNOSIS — R0602 Shortness of breath: Secondary | ICD-10-CM

## 2021-04-13 DIAGNOSIS — J069 Acute upper respiratory infection, unspecified: Secondary | ICD-10-CM | POA: Diagnosis not present

## 2021-04-13 DIAGNOSIS — R059 Cough, unspecified: Secondary | ICD-10-CM

## 2021-04-13 DIAGNOSIS — M1990 Unspecified osteoarthritis, unspecified site: Secondary | ICD-10-CM

## 2021-04-13 MED ORDER — BENZONATATE 100 MG PO CAPS: 100.0000 mg | ORAL_CAPSULE | Freq: Three times a day (TID) | ORAL | 0 refills | Status: DC

## 2021-04-13 MED ORDER — AZITHROMYCIN 250 MG PO TABS: 250.0000 mg | ORAL_TABLET | Freq: Every day | ORAL | 0 refills | Status: DC

## 2021-04-13 MED ORDER — AEROCHAMBER PLUS FLO-VU MEDIUM MISC
1.0000 | Freq: Once | Status: AC
  Administered 2021-04-13: 1

## 2021-04-13 MED ORDER — ALBUTEROL SULFATE HFA 108 (90 BASE) MCG/ACT IN AERS
2.0000 | INHALATION_SPRAY | Freq: Once | RESPIRATORY_TRACT | Status: AC
  Administered 2021-04-13: 2 via RESPIRATORY_TRACT

## 2021-04-13 NOTE — Discharge Instructions (Addendum)
Chest xray is negative for pneumonia or other abnormality  I have sent in azithromycin for you to take. Take 2 tablets today, then one tablet daily for the next 4 days.  I have sent in [place] for you to use one capsule every 8 hours as needed for cough.  I have sent in an albuterol inhaler for you to use 2 puffs every 4-6 hours as needed for cough, shortness of breath, wheezing.  Your COVID and Influenza tests are pending.  You should self quarantine until the test results are back.    Take Tylenol or ibuprofen as needed for fever or discomfort.  Rest and keep yourself hydrated.    Follow-up with your primary care provider if your symptoms are not improving.

## 2021-04-13 NOTE — ED Triage Notes (Signed)
Pt presents with c/o arthritic pain for past month and cough for past week

## 2021-04-14 LAB — NOVEL CORONAVIRUS, NAA: SARS-CoV-2, NAA: NOT DETECTED

## 2021-04-14 LAB — SARS-COV-2, NAA 2 DAY TAT

## 2021-04-18 NOTE — ED Provider Notes (Signed)
[name]   [id number] 04/13/21 Arrival Time: 1000   CC: COVID symptoms  SUBJECTIVE: History from: patient.  Norman Peters is a 65 y.o. male who presents with cough, SOB, increased blood sugars, fatigue, nasal congestion for the last week. Denies sick exposure to COVID, flu or strep. Denies recent travel. Has negative history of Covid. Has completed Covid vaccines. Has not taken OTC medications for this. There are no aggravating or alleviating factors. Denies previous symptoms in the past. Denies sinus pain, rhinorrhea, sore throat, chest pain, nausea, changes in bowel or bladder habits.    ROS: As per HPI.  All other pertinent ROS negative.     Past Medical History:  Diagnosis Date   Acute left-sided low back pain with left-sided sciatica 11/04/2020   Anxiety    Arthritis    Depression 2001   Diabetes mellitus 2010   GERD (gastroesophageal reflux disease)    Hyperlipidemia    Hypertension 2005   Past Surgical History:  Procedure Laterality Date   COLONOSCOPY N/A 07/07/2016   Procedure: COLONOSCOPY;  Surgeon: Norman Houston, MD;  Location: [facility];  Service: Endoscopy;  Laterality: N/A;  1030   FINGER SURGERY Left 1974   fifth , trauma on job, had to replace/repair finger   HERNIA REPAIR  2778   x3, umbilical and bilateral hernia   Allergies  Allergen Reactions   Ibuprofen Rash   Lisinopril Cough   Other Other (See Comments)   Strawberry [Berry]    No current facility-administered medications on file prior to encounter.   Current Outpatient Medications on File Prior to Encounter  Medication Sig Dispense Refill   amLODipine (NORVASC) 10 MG tablet TAKE 1 TABLET EVERY DAY 90 tablet 1   aspirin EC 81 MG tablet Take 81 mg by mouth daily.     busPIRone (BUSPAR) 5 MG tablet TAKE 1 TABLET TWICE DAILY (Patient taking differently: Take 5 mg by mouth 2 (two) times daily.) 180 tablet 1   Cholecalciferol (VITAMIN D3) 400 units CAPS Take 2 capsules by mouth daily.      ezetimibe (ZETIA) 10 MG tablet Take 1 tablet (10 mg total) by mouth daily. 90 tablet 3   FLUoxetine (PROZAC) 40 MG capsule Take 1 capsule (40 mg total) by mouth daily. 90 capsule 3   fluticasone (FLONASE) 50 MCG/ACT nasal spray Use 2 spray(s) in each nostril once daily 16 g 0   insulin glargine (LANTUS) 100 UNIT/ML Solostar Pen Inject 45 units under the skin every morning 15 mL 11   Insulin Pen Needle 32G X 4 MM MISC Use daily to inject insulin DX e11.65 150 each 4   montelukast (SINGULAIR) 10 MG tablet Take 1 tablet (10 mg total) by mouth at bedtime. 30 tablet 3   Multiple Vitamins-Minerals (MULTI-BETIC DIABETES) TABS Take 1 tablet by mouth daily. 30 tablet 3   rosuvastatin (CRESTOR) 5 MG tablet Take one tablet by mouth every Monday, Wednesday and Friday 36 tablet 3   spironolactone (ALDACTONE) 50 MG tablet TAKE 1 TABLET EVERY DAY 90 tablet 1   tadalafil (CIALIS) 10 MG tablet Take 1 tablet (10 mg total) by mouth daily as needed for erectile dysfunction. 10 tablet 1   TRUE METRIX BLOOD GLUCOSE TEST test strip TEST ONE TIME DAILY 100 strip 1   TRUEplus Lancets 28G MISC USE  TO  TEST ONE TIME DAILY 100 each 3   Social History   Socioeconomic History   Marital status: Married    Spouse name: Not  on file   Number of children: Not on file   Years of education: Not on file   Highest education level: Not on file  Occupational History   Not on file  Tobacco Use   Smoking status: Former    Packs/day: 0.50    Years: 30.00    Pack years: 15.00    Types: Cigarettes    Quit date: 01/20/2011    Years since quitting: 10.2   Smokeless tobacco: Never  Vaping Use   Vaping Use: Never used  Substance and Sexual Activity   Alcohol use: No   Drug use: No   Sexual activity: Not Currently  Other Topics Concern   Not on file  Social History Narrative   Not on file   Social Determinants of Health   Financial Resource Strain: Low Risk    Difficulty of Paying Living Expenses: Not hard at all   Food Insecurity: No Food Insecurity   Worried About Charity fundraiser in the Last Year: Never true   [place] in the Last Year: Never true  Transportation Needs: No Transportation Needs   Lack of Transportation (Medical): No   Lack of Transportation (Non-Medical): No  Physical Activity: Insufficiently Active   Days of Exercise per Week: 5 days   Minutes of Exercise per Session: 20 min  Stress: No Stress Concern Present   Feeling of Stress : Not at all  Social Connections: Socially Integrated   Frequency of Communication with Friends and Family: More than three times a week   Frequency of Social Gatherings with Friends and Family: Once a week   Attends Religious Services: More than 4 times per year   Active Member of [employer] or Organizations: Yes   Attends Music therapist: More than 4 times per year   Marital Status: Married  Human resources officer Violence: Not At Risk   Fear of Current or Ex-Partner: No   Emotionally Abused: No   Physically Abused: No   Sexually Abused: No   Family History  Problem Relation Age of Onset   Stroke Mother     OBJECTIVE:  Vitals:   04/13/21 1120  BP: (!) 147/80  Pulse: 91  Resp: 16  Temp: 98.9 F (37.2 C)  TempSrc: Oral  SpO2: 93%     General appearance: alert; appears fatigued, but nontoxic; speaking in full sentences and tolerating own secretions HEENT: NCAT; Ears: EACs clear, TMs pearly gray; Eyes: PERRL.  EOM grossly intact. Sinuses: nontender; Nose: nares patent with clear rhinorrhea, Throat: oropharynx erythematous, cobblestoning present, tonsils non erythematous or enlarged, uvula midline  Neck: supple with LAD Lungs: unlabored respirations, symmetrical air entry; cough: moderate; no respiratory distress; diffuse wheezing noted, scattered rhonchi Heart: regular rate and rhythm.  Radial pulses 2+ symmetrical bilaterally Skin: warm and dry Psychological: alert and cooperative; normal mood and affect  LABS:  No  results found for this or any previous visit (from the past 24 hour(s)).   ASSESSMENT & PLAN:  1. URI with cough and congestion   2. Cough   3. Arthritis pain     Meds ordered this encounter  Medications   albuterol (VENTOLIN HFA) 108 (90 Base) MCG/ACT inhaler 2 puff   AeroChamber Plus Flo-Vu Medium MISC 1 each   azithromycin (ZITHROMAX) 250 MG tablet    Sig: Take 1 tablet (250 mg total) by mouth daily. Take first 2 tablets together, then 1 every day until finished.    Dispense:  6 tablet  Refill:  0    Order Specific Question:   Supervising Provider    Answer:   Chase Picket [id number]   benzonatate (TESSALON) 100 MG capsule    Sig: Take 1 capsule (100 mg total) by mouth every 8 (eight) hours.    Dispense:  21 capsule    Refill:  0    Order Specific Question:   Supervising Provider    Answer:   Chase Picket [id number]   Albuterol inhaler given in office today Azithromycin prescribed for URI Tessalon perles prescribed for cough prn Chest xray negative for pneumonia Continue supportive care at home COVID and flu testing ordered.  It will take between 2-3 days for test results. Someone will contact you regarding abnormal results.   Patient should remain in quarantine until they have received Covid results.  If negative you may resume normal activities (go back to work/school) while practicing hand hygiene, social distance, and mask wearing.  If positive, patient should remain in quarantine for at least 5 days from symptom onset AND greater than 72 hours after symptoms resolution (absence of fever without the use of fever-reducing medication and improvement in respiratory symptoms), whichever is longer Get plenty of rest and push fluids Use OTC zyrtec for nasal congestion, runny nose, and/or sore throat Use OTC flonase for nasal congestion and runny nose Use medications daily for symptom relief Use OTC medications like ibuprofen or tylenol as needed fever or pain Call or  go to the ED if you have any new or worsening symptoms such as fever, worsening cough, shortness of breath, chest tightness, chest pain, turning blue, changes in mental status.  Reviewed expectations re: course of current medical issues. Questions answered. Outlined signs and symptoms indicating need for more acute intervention. Patient verbalized understanding. After Visit Summary given.          Faustino Congress, NP 04/18/21 1153

## 2021-05-05 ENCOUNTER — Ambulatory Visit: Payer: Medicare HMO

## 2021-05-05 ENCOUNTER — Other Ambulatory Visit: Payer: Self-pay

## 2021-05-05 LAB — HM DIABETES EYE EXAM

## 2021-05-07 ENCOUNTER — Encounter: Payer: Self-pay | Admitting: *Deleted

## 2021-05-27 ENCOUNTER — Encounter: Payer: Self-pay | Admitting: Emergency Medicine

## 2021-05-27 ENCOUNTER — Ambulatory Visit
Admission: EM | Admit: 2021-05-27 | Discharge: 2021-05-27 | Disposition: A | Discharge status: Home / Self Care | Payer: Medicare HMO | Attending: Family Medicine | Admitting: Family Medicine

## 2021-05-27 ENCOUNTER — Other Ambulatory Visit: Payer: Self-pay

## 2021-05-27 DIAGNOSIS — K120 Recurrent oral aphthae: Secondary | ICD-10-CM

## 2021-05-27 DIAGNOSIS — R070 Pain in throat: Secondary | ICD-10-CM

## 2021-05-27 MED ORDER — CLOTRIMAZOLE 10 MG MT TROC: 10.0000 mg | Freq: Every day | OROMUCOSAL | 0 refills | Status: DC

## 2021-05-27 NOTE — ED Provider Notes (Signed)
[place]   [id number] 05/27/21 Arrival Time: 8099  ASSESSMENT & PLAN:  1. Throat pain   2. Aphthous ulcer of mouth    No signs of peritonsillar abscess.  Aphthous ulcers present. Cannot r/o thrush. To ensure he is cleaning dentures thoroughly and regularly.  Begin trial of: Meds ordered this encounter  Medications   clotrimazole (MYCELEX) 10 MG troche    Sig: Take 1 tablet (10 mg total) by mouth 5 (five) times daily.    Dispense:  70 tablet    Refill:  0   Recommend:  Follow-up Information     [facility].   Why: If worsening or failing to improve as anticipated. Contact information: [address] [place] [address] [phone]                  Reviewed expectations re: course of current medical issues. Questions answered. Outlined signs and symptoms indicating need for more acute intervention. Patient verbalized understanding. After Visit Summary given.   SUBJECTIVE:  Norman Peters is a 65 y.o. male who reports a sore throat. Finished Zithromax approx 3 w ago after dx of bronchitis. Now mouth/throat hurting. Wears dentures; cleans regularly. Painful swallowing. Afebrile. Flonase without relief. No neck pain or swelling.   OBJECTIVE:  Vitals:   05/27/21 1503  BP: (!) 144/73  Pulse: (!) 106  Resp: 16  Temp: 98.9 F (37.2 C)  TempSrc: Oral  SpO2: 91%    Recheck P: 98 and reg General appearance: alert; no distress HEENT: throat with mild erythema; several aphthous ulcers on roof of mouth; buccal mucosa inflamed; uvula is midline Neck: supple with FROM; no lymphadenopathy Lungs: speaks full sentences without difficulty; unlabored Skin: reveals no rash; warm and dry Psychological: alert and cooperative; normal mood and affect  Allergies  Allergen Reactions   Ibuprofen Rash   Lisinopril Cough   Other Other (See Comments)   Strawberry [Berry]     Past Medical History:  Diagnosis  Date   Acute left-sided low back pain with left-sided sciatica 11/04/2020   Anxiety    Arthritis    Depression 2001   Diabetes mellitus 2010   GERD (gastroesophageal reflux disease)    Hyperlipidemia    Hypertension 2005   Social History   Socioeconomic History   Marital status: Married    Spouse name: Not on file   Number of children: Not on file   Years of education: Not on file   Highest education level: Not on file  Occupational History   Not on file  Tobacco Use   Smoking status: Former    Packs/day: 0.50    Years: 30.00    Pack years: 15.00    Types: Cigarettes    Quit date: 01/20/2011    Years since quitting: 10.3   Smokeless tobacco: Never  Vaping Use   Vaping Use: Never used  Substance and Sexual Activity   Alcohol use: No   Drug use: No   Sexual activity: Not Currently  Other Topics Concern   Not on file  Social History Narrative   Not on file   Social Determinants of Health   Financial Resource Strain: Low Risk    Difficulty of Paying Living Expenses: Not hard at all  Food Insecurity: No Food Insecurity   Worried About Charity fundraiser in the Last Year: Never true   Ran Out of Food in the Last Year: Never true  Transportation Needs: No Transportation  Needs   Lack of Transportation (Medical): No   Lack of Transportation (Non-Medical): No  Physical Activity: Insufficiently Active   Days of Exercise per Week: 5 days   Minutes of Exercise per Session: 20 min  Stress: No Stress Concern Present   Feeling of Stress : Not at all  Social Connections: Socially Integrated   Frequency of Communication with Friends and Family: More than three times a week   Frequency of Social Gatherings with Friends and Family: Once a week   Attends Religious Services: More than 4 times per year   Active Member of [employer] or Organizations: Yes   Attends Music therapist: More than 4 times per year   Marital Status: Married  Human resources officer Violence: Not At  Risk   Fear of Current or Ex-Partner: No   Emotionally Abused: No   Physically Abused: No   Sexually Abused: No   Family History  Problem Relation Age of Onset   Stroke Mother            Vanessa Kick, MD 05/28/21 [phone]

## 2021-05-27 NOTE — ED Triage Notes (Signed)
Was seen a month ago and dx with bronchitis.  States he was felling better until recently, states both sides of throat and it feels better when he can spit mucus out.  States both ears are stopped up.  Has been using Flonase and an albuterol inhaler

## 2021-06-01 ENCOUNTER — Other Ambulatory Visit: Payer: Self-pay | Admitting: Family Medicine

## 2021-06-03 ENCOUNTER — Other Ambulatory Visit: Payer: Self-pay | Admitting: Family Medicine

## 2021-06-10 ENCOUNTER — Other Ambulatory Visit: Payer: Self-pay | Admitting: "Endocrinology

## 2021-06-10 DIAGNOSIS — E1121 Type 2 diabetes mellitus with diabetic nephropathy: Secondary | ICD-10-CM | POA: Diagnosis not present

## 2021-06-10 DIAGNOSIS — Z794 Long term (current) use of insulin: Secondary | ICD-10-CM | POA: Diagnosis not present

## 2021-06-10 DIAGNOSIS — N1831 Chronic kidney disease, stage 3a: Secondary | ICD-10-CM | POA: Diagnosis not present

## 2021-06-11 LAB — T4, FREE: Free T4: 1.14 ng/dL (ref 0.82–1.77)

## 2021-06-11 LAB — COMPREHENSIVE METABOLIC PANEL
ALT: 13 IU/L (ref 0–44)
AST: 35 IU/L (ref 0–40)
Albumin/Globulin Ratio: 1.2 (ref 1.2–2.2)
Albumin: 4 g/dL (ref 3.8–4.8)
Alkaline Phosphatase: 83 IU/L (ref 44–121)
BUN/Creatinine Ratio: 10 (ref 10–24)
BUN: 16 mg/dL (ref 8–27)
Bilirubin Total: 1.3 mg/dL — ABNORMAL HIGH (ref 0.0–1.2)
CO2: 21 mmol/L (ref 20–29)
Calcium: 9.2 mg/dL (ref 8.6–10.2)
Chloride: 102 mmol/L (ref 96–106)
Creatinine, Ser: 1.57 mg/dL — ABNORMAL HIGH (ref 0.76–1.27)
Globulin, Total: 3.3 g/dL (ref 1.5–4.5)
Glucose: 232 mg/dL — ABNORMAL HIGH (ref 65–99)
Potassium: 5.2 mmol/L (ref 3.5–5.2)
Sodium: 141 mmol/L (ref 134–144)
Total Protein: 7.3 g/dL (ref 6.0–8.5)
eGFR: 49 mL/min/{1.73_m2} — ABNORMAL LOW (ref >59)

## 2021-06-11 LAB — TSH: TSH: 2.1 u[IU]/mL (ref 0.450–4.500)

## 2021-06-11 LAB — LIPID PANEL W/O CHOL/HDL RATIO
Cholesterol, Total: 132 mg/dL (ref 100–199)
HDL: 28 mg/dL — ABNORMAL LOW (ref >39)
LDL Chol Calc (NIH): 76 mg/dL (ref 0–99)
Triglycerides: 158 mg/dL — ABNORMAL HIGH (ref 0–149)
VLDL Cholesterol Cal: 28 mg/dL (ref 5–40)

## 2021-06-17 ENCOUNTER — Ambulatory Visit (INDEPENDENT_AMBULATORY_CARE_PROVIDER_SITE_OTHER): Payer: Medicare HMO | Admitting: Nurse Practitioner

## 2021-06-17 ENCOUNTER — Other Ambulatory Visit: Payer: Self-pay

## 2021-06-17 ENCOUNTER — Encounter: Payer: Self-pay | Admitting: Nurse Practitioner

## 2021-06-17 VITALS — BP 123/71 | HR 98 | Ht 68.0 in | Wt 209.0 lb

## 2021-06-17 DIAGNOSIS — E1122 Type 2 diabetes mellitus with diabetic chronic kidney disease: Secondary | ICD-10-CM | POA: Diagnosis not present

## 2021-06-17 DIAGNOSIS — N1831 Chronic kidney disease, stage 3a: Secondary | ICD-10-CM

## 2021-06-17 DIAGNOSIS — Z794 Long term (current) use of insulin: Secondary | ICD-10-CM | POA: Diagnosis not present

## 2021-06-17 DIAGNOSIS — I10 Essential (primary) hypertension: Secondary | ICD-10-CM | POA: Diagnosis not present

## 2021-06-17 DIAGNOSIS — E782 Mixed hyperlipidemia: Secondary | ICD-10-CM

## 2021-06-17 LAB — POCT GLYCOSYLATED HEMOGLOBIN (HGB A1C): Hemoglobin A1C: 11.7 % — AB (ref 4.0–5.6)

## 2021-06-17 MED ORDER — INSULIN GLARGINE 100 UNIT/ML SOLOSTAR PEN: 55.0000 [IU] | PEN_INJECTOR | Freq: Every day | SUBCUTANEOUS | 3 refills | Status: DC

## 2021-06-17 NOTE — Progress Notes (Signed)
Endocrinology follow-up note    06/17/2021, 2:33 PM   Subjective:    Patient ID: Norman Peters, male    DOB: November 27, 1955.  Norman Peters is being seen in follow-up after he was seen in consultation for management of currently uncontrolled symptomatic diabetes requested by  Fayrene Helper, MD.   Past Medical History:  Diagnosis Date   Acute left-sided low back pain with left-sided sciatica 11/04/2020   Anxiety    Arthritis    Depression 2001   Diabetes mellitus 2010   GERD (gastroesophageal reflux disease)    Hyperlipidemia    Hypertension 2005    Past Surgical History:  Procedure Laterality Date   COLONOSCOPY N/A 07/07/2016   Procedure: COLONOSCOPY;  Surgeon: Rogene Houston, MD;  Location: [facility];  Service: Endoscopy;  Laterality: N/A;  1030   FINGER SURGERY Left 1974   fifth , trauma on job, had to replace/repair finger   HERNIA REPAIR  [id number]   x3, umbilical and bilateral hernia    Social History   Socioeconomic History   Marital status: Married    Spouse name: Not on file   Number of children: Not on file   Years of education: Not on file   Highest education level: Not on file  Occupational History   Not on file  Tobacco Use   Smoking status: Former    Packs/day: 0.50    Years: 30.00    Pack years: 15.00    Types: Cigarettes    Quit date: 01/20/2011    Years since quitting: 10.4   Smokeless tobacco: Never  Vaping Use   Vaping Use: Never used  Substance and Sexual Activity   Alcohol use: No   Drug use: No   Sexual activity: Not Currently  Other Topics Concern   Not on file  Social History Narrative   Not on file   Social Determinants of Health   Financial Resource Strain: Low Risk    Difficulty of Paying Living Expenses: Not hard at all  Food Insecurity: No Food Insecurity   Worried About Charity fundraiser in the Last Year: Never true   [place] in  the Last Year: Never true  Transportation Needs: No Transportation Needs   Lack of Transportation (Medical): No   Lack of Transportation (Non-Medical): No  Physical Activity: Insufficiently Active   Days of Exercise per Week: 5 days   Minutes of Exercise per Session: 20 min  Stress: No Stress Concern Present   Feeling of Stress : Not at all  Social Connections: Socially Integrated   Frequency of Communication with Friends and Family: More than three times a week   Frequency of Social Gatherings with Friends and Family: Once a week   Attends Religious Services: More than 4 times per year   Active Member of [employer] or Organizations: Yes   Attends Archivist Meetings: More than 4 times per year   Marital Status: Married    Family History  Problem Relation Age of Onset   Stroke Mother     Outpatient Encounter Medications as of 06/17/2021  Medication Sig  albuterol (VENTOLIN HFA) 108 (90 Base) MCG/ACT inhaler Inhale 2 puffs into the lungs daily as needed for wheezing or shortness of breath.   amLODipine (NORVASC) 10 MG tablet TAKE 1 TABLET EVERY DAY   aspirin EC 81 MG tablet Take 81 mg by mouth daily.   benzonatate (TESSALON) 100 MG capsule Take 1 capsule (100 mg total) by mouth every 8 (eight) hours.   busPIRone (BUSPAR) 5 MG tablet TAKE 1 TABLET TWICE DAILY (Patient taking differently: Take 5 mg by mouth 2 (two) times daily.)   Cholecalciferol (VITAMIN D3) 400 units CAPS Take 2 capsules by mouth daily.   clotrimazole (MYCELEX) 10 MG troche Take 1 tablet (10 mg total) by mouth 5 (five) times daily.   ezetimibe (ZETIA) 10 MG tablet Take 1 tablet (10 mg total) by mouth daily.   FLUoxetine (PROZAC) 40 MG capsule TAKE 1 CAPSULE EVERY DAY (DOSE INCREASE)   fluticasone (FLONASE) 50 MCG/ACT nasal spray Use 2 spray(s) in each nostril once daily   insulin glargine (LANTUS) 100 UNIT/ML Solostar Pen Inject 55 Units into the skin at bedtime.   Insulin Pen Needle 32G X 4 MM MISC Use daily  to inject insulin DX e11.65   montelukast (SINGULAIR) 10 MG tablet Take 1 tablet (10 mg total) by mouth at bedtime.   Multiple Vitamins-Minerals (MULTI-BETIC DIABETES) TABS Take 1 tablet by mouth daily.   rosuvastatin (CRESTOR) 5 MG tablet TAKE 1 TABLET EVERY MONDAY, WEDNESDAY AND FRIDAY (DISCONTINUE PRAVASTATIN)   spironolactone (ALDACTONE) 50 MG tablet TAKE 1 TABLET EVERY DAY   tadalafil (CIALIS) 10 MG tablet Take 1 tablet (10 mg total) by mouth daily as needed for erectile dysfunction.   TRUE METRIX BLOOD GLUCOSE TEST test strip TEST ONE TIME DAILY   TRUEplus Lancets 28G MISC USE  TO  TEST ONE TIME DAILY   [DISCONTINUED] insulin glargine (LANTUS) 100 UNIT/ML Solostar Pen Inject 45 units under the skin every morning   No facility-administered encounter medications on file as of 06/17/2021.    ALLERGIES: Allergies  Allergen Reactions   Ibuprofen Rash   Lisinopril Cough   Other Other (See Comments)   Strawberry [Berry]     VACCINATION STATUS: Immunization History  Administered Date(s) Administered   Fluad Quad(high Dose 65+) 09/02/2020   Influenza,inj,Quad PF,6+ Mos 07/14/2016, 08/25/2017, 08/30/2018, 07/02/2019   PFIZER(Purple Top)SARS-COV-2 Vaccination 01/25/2020, 02/19/2020, 09/28/2020   Pneumococcal Polysaccharide-23 02/10/2016    Diabetes He presents for his follow-up diabetic visit. He has type 2 diabetes mellitus. Onset time: He was diagnosed at approximate age of 65 years. His disease course has been worsening. There are no hypoglycemic associated symptoms. Pertinent negatives for hypoglycemia include no confusion, headaches, pallor or seizures. Associated symptoms include blurred vision, fatigue, polydipsia, polyuria and weight loss. Pertinent negatives for diabetes include no chest pain, no polyphagia and no weakness. There are no hypoglycemic complications. Symptoms are worsening. Diabetic complications include nephropathy and peripheral neuropathy. Risk factors for coronary  artery disease include diabetes mellitus, dyslipidemia, obesity, hypertension, male sex, tobacco exposure and sedentary lifestyle. Current diabetic treatment includes insulin injections. He is compliant with treatment most of the time. His weight is decreasing steadily. He is following a generally unhealthy diet. When asked about meal planning, he reported none. He has had a previous visit with a dietitian. He rarely participates in exercise. His breakfast blood glucose range is generally 180-200 mg/dl. His overall blood glucose range is >200 mg/dl. (He presents today with his logs, no meter, showing above target fasting and postprandial glycemic profile.  His POCT A1c today is 11.7%, worsening drastically from previous visit of 8.5%.  He does report recent oral steroids which caused his glucose to become abnormally elevated.  He denies any hypoglycemia.) An ACE inhibitor/angiotensin II receptor blocker is not being taken. He does not see a podiatrist.Eye exam is current.  Hyperlipidemia This is a chronic problem. The current episode started more than 1 year ago. The problem is uncontrolled. Recent lipid tests were reviewed and are variable. Exacerbating diseases include chronic renal disease, diabetes and obesity. Factors aggravating his hyperlipidemia include fatty foods. Pertinent negatives include no chest pain, myalgias or shortness of breath. Current antihyperlipidemic treatment includes statins. The current treatment provides mild improvement of lipids. Compliance problems include adherence to exercise and adherence to diet.  Risk factors for coronary artery disease include diabetes mellitus, dyslipidemia, male sex, obesity, hypertension and a sedentary lifestyle.  Hypertension This is a chronic problem. The current episode started more than 1 year ago. The problem is unchanged. The problem is controlled. Associated symptoms include blurred vision. Pertinent negatives include no chest pain, headaches,  neck pain, palpitations or shortness of breath. There are no associated agents to hypertension. Risk factors for coronary artery disease include diabetes mellitus, male gender, smoking/tobacco exposure, sedentary lifestyle, obesity and dyslipidemia. Past treatments include calcium channel blockers and diuretics. The current treatment provides mild improvement. There are no compliance problems.  Hypertensive end-organ damage includes kidney disease. Identifiable causes of hypertension include chronic renal disease.   Review of systems  Constitutional: + Minimally fluctuating body weight,  current Body mass index is 31.78 kg/m. , no fatigue, no subjective hyperthermia, no subjective hypothermia Eyes: no blurry vision, no xerophthalmia ENT: no sore throat, no nodules palpated in throat, no dysphagia/odynophagia, no hoarseness Cardiovascular: no chest pain, no shortness of breath, no palpitations, no leg swelling Respiratory: no cough, no shortness of breath Gastrointestinal: no nausea/vomiting/diarrhea Musculoskeletal: no muscle/joint aches Skin: no rashes, no hyperemia, has dark ring around his left foot Neurological: no tremors, + numbness/tingling to LLE, no dizziness Psychiatric: no depression, no anxiety   Objective:    BP 123/71   Pulse 98   Ht '5\' 8"'$  (1.727 m)   Wt 209 lb (94.8 kg)   BMI 31.78 kg/m   Wt Readings from Last 3 Encounters:  06/17/21 209 lb (94.8 kg)  04/03/21 214 lb (97.1 kg)  03/16/21 212 lb (96.2 kg)    BP Readings from Last 3 Encounters:  06/17/21 123/71  05/27/21 (!) 144/73  04/13/21 (!) 147/80     Physical Exam- Limited  Constitutional:  Body mass index is 31.78 kg/m. , not in acute distress, normal state of mind Eyes:  EOMI, no exophthalmos Neck: Supple Cardiovascular: RRR, no murmurs, rubs, or gallops, no edema Respiratory: Adequate breathing efforts, no crackles, rales, rhonchi, or wheezing Musculoskeletal: no gross deformities, strength intact in  all four extremities, no gross restriction of joint movements Skin:  no rashes, no hyperemia Neurological: no tremor with outstretched hands   Foot exam:  No rashes, ulcers, cuts, calluses, mild onychodystrophy.  Good pulses bilat. Good sensation to 10 g monofilament bilat. Right foot has ring of darker pigmented skin where dorsal and plantar foot meet- does not itch or burn, still has sensation   CMP     Component Value Date/Time   NA 141 06/10/2021 0844   K 5.2 06/10/2021 0844   CL 102 06/10/2021 0844   CO2 21 06/10/2021 0844   GLUCOSE 232 (H) 06/10/2021 0844   GLUCOSE 158 (  H) 03/07/2021 1215   BUN 16 06/10/2021 0844   CREATININE 1.57 (H) 06/10/2021 0844   CREATININE 1.47 (H) 08/25/2020 0927   CALCIUM 9.2 06/10/2021 0844   PROT 7.3 06/10/2021 0844   ALBUMIN 4.0 06/10/2021 0844   AST 35 06/10/2021 0844   ALT 13 06/10/2021 0844   ALKPHOS 83 06/10/2021 0844   BILITOT 1.3 (H) 06/10/2021 0844   GFRNONAA 52 (L) 03/07/2021 1215   GFRNONAA 50 (L) 08/25/2020 0927   GFRAA 61 12/10/2020 0933   GFRAA 58 (L) 08/25/2020 0927     Diabetic Labs (most recent): Lab Results  Component Value Date   HGBA1C 11.7 (A) 06/17/2021   HGBA1C 8.5 (A) 03/16/2021   HGBA1C 9.9 (H) 12/12/2020     Lipid Panel     Component Value Date/Time   CHOL 132 06/10/2021 0844   TRIG 158 (H) 06/10/2021 0844   HDL 28 (L) 06/10/2021 0844   CHOLHDL 3.2 12/10/2020 0933   CHOLHDL 3.4 08/25/2020 0927   VLDL 39 (H) 05/14/2017 0831   LDLCALC 76 06/10/2021 0844   LDLCALC 55 08/25/2020 0927   LABVLDL 28 06/10/2021 0844      Lab Results  Component Value Date   TSH 2.100 06/10/2021   TSH 2.79 08/25/2020   TSH 1.96 08/24/2019   TSH 1.34 01/31/2018   TSH 1.61 02/10/2016   FREET4 1.14 06/10/2021   FREET4 1.0 08/25/2020     Assessment & Plan:   1) Type 2 diabetes mellitus with stage 3a chronic kidney disease, with long-term current use of insulin ([place])  - Aashay Savignano has currently uncontrolled  symptomatic type 2 DM since  65 years of age.  He presents today with his logs, no meter, showing above target fasting and postprandial glycemic profile.  His POCT A1c today is 11.7%, worsening drastically from previous visit of 8.5%.  He does report recent oral steroids which caused his glucose to become abnormally elevated.  He denies any hypoglycemia.    Recent labs reviewed.  - I had a long discussion with him about the progressive nature of diabetes and the pathology behind its complications. -his diabetes is complicated by CKD and he remains at a high risk for more acute and chronic complications which include CAD, CVA, CKD, retinopathy, and neuropathy. These are all discussed in detail with him.  - Nutritional counseling repeated at each appointment due to patients tendency to fall back in to old habits.  - The patient admits there is a room for improvement in their diet and drink choices. -  Suggestion is made for the patient to avoid simple carbohydrates from their diet including Cakes, Sweet Desserts / Pastries, Ice Cream, Soda (diet and regular), Sweet Tea, Candies, Chips, Cookies, Sweet Pastries, Store Bought Juices, Alcohol in Excess of 1-2 drinks a day, Artificial Sweeteners, Coffee Creamer, and "Sugar-free" Products. This will help patient to have stable blood glucose profile and potentially avoid unintended weight gain.   - I encouraged the patient to switch to unprocessed or minimally processed complex starch and increased protein intake (animal or plant source), fruits, and vegetables.   - Patient is advised to stick to a routine mealtimes to eat 3 meals a day and avoid unnecessary snacks (to snack only to correct hypoglycemia).  - I have approached him with the following individualized plan to manage his diabetes and patient agrees:   - he will continue to benefit from basal insulin.    -Based on his hyperglycemia, he is advised to increase his  Lantus to 55 units SQ nightly.   He would like to avoid prandial insulin for now, and he will be given that chance.  -He is encouraged to continue monitoring blood glucose twice daily, before breakfast and before bed, and to call the clinic if he has readings less than 70 or greater than 200 for 3 tests in a row.  - he is warned not to take insulin without proper monitoring per orders.  -He reports that he had developed unexplained cough while taking Glipizide and Metformin.  After he finished his Glipizide and Metformin, reportedly the cough has improved.  He is advised to stay off of Glipizide and Metformin for now.   - he will be considered for incretin therapy as appropriate next visit.  - Specific targets for  A1c;  LDL, HDL,  and Triglycerides were discussed with the patient.  2) Blood Pressure /Hypertension:  His blood pressure is controlled to target.  he is advised to continue his current medications including Amlodipine 10 mg p.o. daily with breakfast and Spironolactone 50 mg po daily.  3) Lipids/Hyperlipidemia:   His most recent lipid panel from 06/10/21 shows controlled LDL of 76 and elevated triglycerides of 158 (improving).  He is advised to continue Crestor 5 mg po every other day at bedtime and Zetia 10 mg po daily.  Side effects and precautions discussed with him.   4)  Weight/Diet:  His Body mass index is 31.78 kg/m.  -   clearly complicating his diabetes care.   he is a candidate for weight loss. I discussed with him the fact that loss of 5 - 10% of his  current body weight will have the most impact on his diabetes management.  Exercise, and detailed carbohydrates information provided  -  detailed on discharge instructions.  5) Chronic Care/Health Maintenance: -he is on a Statin medications and is encouraged to initiate and continue to follow up with Ophthalmology, Dentist, Podiatrist (advised him to get established with one soon to evaluate his discoloration to his left foot) at least yearly or according to  recommendations, and advised to stay away from smoking. I have recommended yearly flu vaccine and pneumonia vaccine at least every 5 years; moderate intensity exercise for up to 150 minutes weekly; and  sleep for at least 7 hours a day.  - he is advised to maintain close follow up with Fayrene Helper, MD for primary care needs, as well as his other providers for optimal and coordinated care.      I spent 37 minutes in the care of the patient today including review of labs from [place], Lipids, Thyroid Function, Hematology (current and previous including abstractions from other facilities); face-to-face time discussing  his blood glucose readings/logs, discussing hypoglycemia and hyperglycemia episodes and symptoms, medications doses, his options of short and long term treatment based on the latest standards of care / guidelines;  discussion about incorporating lifestyle medicine;  and documenting the encounter.    Please refer to Patient Instructions for Blood Glucose Monitoring and Insulin/Medications Dosing Guide"  in media tab for additional information. Please  also refer to " Patient Self Inventory" in the Media  tab for reviewed elements of pertinent patient history.  [name] participated in the discussions, expressed understanding, and voiced agreement with the above plans.  All questions were answered to his satisfaction. he is encouraged to contact clinic should he have any questions or concerns prior to his return visit.   Follow up plan: - Return in  about 1 month (around 07/18/2021) for Diabetes F/U, Bring meter and logs, No previsit labs.  Rayetta Pigg, [facility] [address] [place], [place] [address] Phone: [phone] Fax: [phone]  06/17/2021, 2:33 PM

## 2021-06-17 NOTE — Patient Instructions (Signed)
Diabetes Mellitus and Nutrition, Adult When you have diabetes, or diabetes mellitus, it is very important to have healthy eating habits because your blood sugar (glucose) levels are greatly affected by what you eat and drink. Eating healthy foods in the right amounts, at about the same times every day, can help you:  Control your blood glucose.  Lower your risk of heart disease.  Improve your blood pressure.  Reach or maintain a healthy weight. What can affect my meal plan? Every person with diabetes is different, and each person has different needs for a meal plan. Your health care provider may recommend that you work with a dietitian to make a meal plan that is best for you. Your meal plan may vary depending on factors such as:  The calories you need.  The medicines you take.  Your weight.  Your blood glucose, blood pressure, and cholesterol levels.  Your activity level.  Other health conditions you have, such as heart or kidney disease. How do carbohydrates affect me? Carbohydrates, also called carbs, affect your blood glucose level more than any other type of food. Eating carbs naturally raises the amount of glucose in your blood. Carb counting is a method for keeping track of how many carbs you eat. Counting carbs is important to keep your blood glucose at a healthy level, especially if you use insulin or take certain oral diabetes medicines. It is important to know how many carbs you can safely have in each meal. This is different for every person. Your dietitian can help you calculate how many carbs you should have at each meal and for each snack. How does alcohol affect me? Alcohol can cause a sudden decrease in blood glucose (hypoglycemia), especially if you use insulin or take certain oral diabetes medicines. Hypoglycemia can be a life-threatening condition. Symptoms of hypoglycemia, such as sleepiness, dizziness, and confusion, are similar to symptoms of having too much  alcohol.  Do not drink alcohol if: ? Your health care provider tells you not to drink. ? You are pregnant, may be pregnant, or are planning to become pregnant.  If you drink alcohol: ? Do not drink on an empty stomach. ? Limit how much you use to:  0-1 drink a day for women.  0-2 drinks a day for men. ? Be aware of how much alcohol is in your drink. In the [place] one drink equals one 12 oz bottle of beer (355 mL), one 5 oz glass of wine (148 mL), or one 1 oz glass of hard liquor (44 mL). ? Keep yourself hydrated with water, diet soda, or unsweetened iced tea.  Keep in mind that regular soda, juice, and other mixers may contain a lot of sugar and must be counted as carbs. What are tips for following this plan? Reading food labels  Start by checking the serving size on the "Nutrition Facts" label of packaged foods and drinks. The amount of calories, carbs, fats, and other nutrients listed on the label is based on one serving of the item. Many items contain more than one serving per package.  Check the total grams (g) of carbs in one serving. You can calculate the number of servings of carbs in one serving by dividing the total carbs by 15. For example, if a food has 30 g of total carbs per serving, it would be equal to 2 servings of carbs.  Check the number of grams (g) of saturated fats and trans fats in one serving. Choose foods that have   a low amount or none of these fats.  Check the number of milligrams (mg) of salt (sodium) in one serving. Most people should limit total sodium intake to less than 2,300 mg per day.  Always check the nutrition information of foods labeled as "low-fat" or "nonfat." These foods may be higher in added sugar or refined carbs and should be avoided.  Talk to your dietitian to identify your daily goals for nutrients listed on the label. Shopping  Avoid buying canned, pre-made, or processed foods. These foods tend to be high in fat, sodium, and added  sugar.  Shop around the outside edge of the grocery store. This is where you will most often find fresh fruits and vegetables, bulk grains, fresh meats, and fresh dairy. Cooking  Use low-heat cooking methods, such as baking, instead of high-heat cooking methods like deep frying.  Cook using healthy oils, such as olive, canola, or sunflower oil.  Avoid cooking with butter, cream, or high-fat meats. Meal planning  Eat meals and snacks regularly, preferably at the same times every day. Avoid going long periods of time without eating.  Eat foods that are high in fiber, such as fresh fruits, vegetables, beans, and whole grains. Talk with your dietitian about how many servings of carbs you can eat at each meal.  Eat 4-6 oz (112-168 g) of lean protein each day, such as lean meat, chicken, fish, eggs, or tofu. One ounce (oz) of lean protein is equal to: ? 1 oz (28 g) of meat, chicken, or fish. ? 1 egg. ?  cup (62 g) of tofu.  Eat some foods each day that contain healthy fats, such as avocado, nuts, seeds, and fish.   What foods should I eat? Fruits Berries. Apples. Oranges. Peaches. Apricots. Plums. Grapes. Mango. Papaya. Pomegranate. Kiwi. Cherries. Vegetables Lettuce. Spinach. Leafy greens, including kale, chard, collard greens, and mustard greens. Beets. Cauliflower. Cabbage. Broccoli. Carrots. Vendetti beans. Tomatoes. Peppers. Onions. Cucumbers. Brussels sprouts. Grains Whole grains, such as whole-wheat or whole-grain bread, crackers, tortillas, cereal, and pasta. Unsweetened oatmeal. Quinoa. Brown or wild rice. Meats and other proteins Seafood. Poultry without skin. Lean cuts of poultry and beef. Tofu. Nuts. Seeds. Dairy Low-fat or fat-free dairy products such as milk, yogurt, and cheese. The items listed above may not be a complete list of foods and beverages you can eat. Contact a dietitian for more information. What foods should I avoid? Fruits Fruits canned with  syrup. Vegetables Canned vegetables. Frozen vegetables with butter or cream sauce. Grains Refined white flour and flour products such as bread, pasta, snack foods, and cereals. Avoid all processed foods. Meats and other proteins Fatty cuts of meat. Poultry with skin. Breaded or fried meats. Processed meat. Avoid saturated fats. Dairy Full-fat yogurt, cheese, or milk. Beverages Sweetened drinks, such as soda or iced tea. The items listed above may not be a complete list of foods and beverages you should avoid. Contact a dietitian for more information. Questions to ask a health care provider  Do I need to meet with a diabetes educator?  Do I need to meet with a dietitian?  What number can I call if I have questions?  When are the best times to check my blood glucose? Where to find more information:  American Diabetes Association: diabetes.org  Academy of Nutrition and Dietetics: www.eatright.org  National Institute of Diabetes and Digestive and Kidney Diseases: www.niddk.nih.gov  Association of Diabetes Care and Education Specialists: www.diabeteseducator.org Summary  It is important to have healthy eating   habits because your blood sugar (glucose) levels are greatly affected by what you eat and drink.  A healthy meal plan will help you control your blood glucose and maintain a healthy lifestyle.  Your health care provider may recommend that you work with a dietitian to make a meal plan that is best for you.  Keep in mind that carbohydrates (carbs) and alcohol have immediate effects on your blood glucose levels. It is important to count carbs and to use alcohol carefully. This information is not intended to replace advice given to you by your health care provider. Make sure you discuss any questions you have with your health care provider. Document Revised: 10/02/2019 Document Reviewed: 10/02/2019 Elsevier Patient Education  2021 Elsevier Inc.  

## 2021-06-24 ENCOUNTER — Other Ambulatory Visit: Payer: Self-pay | Admitting: Family Medicine

## 2021-06-24 DIAGNOSIS — I10 Essential (primary) hypertension: Secondary | ICD-10-CM

## 2021-06-29 DIAGNOSIS — E113213 Type 2 diabetes mellitus with mild nonproliferative diabetic retinopathy with macular edema, bilateral: Secondary | ICD-10-CM | POA: Diagnosis not present

## 2021-06-29 LAB — HM DIABETES EYE EXAM

## 2021-07-01 LAB — HM DIABETES EYE EXAM

## 2021-07-06 ENCOUNTER — Other Ambulatory Visit: Payer: Self-pay

## 2021-07-06 ENCOUNTER — Encounter: Payer: Self-pay | Admitting: Nurse Practitioner

## 2021-07-06 ENCOUNTER — Ambulatory Visit (INDEPENDENT_AMBULATORY_CARE_PROVIDER_SITE_OTHER): Payer: Medicare HMO | Admitting: Nurse Practitioner

## 2021-07-06 DIAGNOSIS — E1140 Type 2 diabetes mellitus with diabetic neuropathy, unspecified: Secondary | ICD-10-CM | POA: Diagnosis not present

## 2021-07-06 DIAGNOSIS — E1169 Type 2 diabetes mellitus with other specified complication: Secondary | ICD-10-CM | POA: Insufficient documentation

## 2021-07-06 MED ORDER — GABAPENTIN 100 MG PO CAPS: 100.0000 mg | ORAL_CAPSULE | Freq: Three times a day (TID) | ORAL | 3 refills | Status: DC

## 2021-07-06 NOTE — Patient Instructions (Signed)
Please ask endocrinologist about diabetes educator and nutrition classes if blood sugar is consistently above 120 or you have a hard time with planning meals that are low carb.

## 2021-07-06 NOTE — Assessment & Plan Note (Signed)
-  Endocrinology manages DM meds -discussed dietary compliance d/t A1c increasing despite endocrinology treatment -recommend dietician/DM educator if he hasn't done this previously -Rx. gabapentin

## 2021-07-06 NOTE — Progress Notes (Signed)
Acute Office Visit  Subjective:    Patient ID: Norman Peters, male    DOB: February 13, 1956, 65 y.o.   MRN: [id number]  Chief Complaint  Patient presents with   Hand Pain    Finger tips feel numb, ongoing x4 days. L foot is also going numb intermittently.     HPI Patient is in today for numbness in his fingertips x4 days and he also has left foot numbness. His A1c has been elevated for at least a year and has been climbing.  Past Medical History:  Diagnosis Date   Acute left-sided low back pain with left-sided sciatica 11/04/2020   Anxiety    Arthritis    Depression 2001   Diabetes mellitus 2010   GERD (gastroesophageal reflux disease)    Hyperlipidemia    Hypertension 2005    Past Surgical History:  Procedure Laterality Date   COLONOSCOPY N/A 07/07/2016   Procedure: COLONOSCOPY;  Surgeon: Rogene Houston, MD;  Location: [facility];  Service: Endoscopy;  Laterality: N/A;  1030   FINGER SURGERY Left 1974   fifth , trauma on job, had to replace/repair finger   HERNIA REPAIR  9379   x3, umbilical and bilateral hernia    Family History  Problem Relation Age of Onset   Stroke Mother     Social History   Socioeconomic History   Marital status: Married    Spouse name: Not on file   Number of children: Not on file   Years of education: Not on file   Highest education level: Not on file  Occupational History   Not on file  Tobacco Use   Smoking status: Former    Packs/day: 0.50    Years: 30.00    Pack years: 15.00    Types: Cigarettes    Quit date: 01/20/2011    Years since quitting: 10.4   Smokeless tobacco: Never  Vaping Use   Vaping Use: Never used  Substance and Sexual Activity   Alcohol use: No   Drug use: No   Sexual activity: Not Currently  Other Topics Concern   Not on file  Social History Narrative   Not on file   Social Determinants of Health   Financial Resource Strain: Low Risk    Difficulty of Paying Living Expenses: Not hard at all  Food  Insecurity: No Food Insecurity   Worried About Charity fundraiser in the Last Year: Never true   [place] in the Last Year: Never true  Transportation Needs: No Transportation Needs   Lack of Transportation (Medical): No   Lack of Transportation (Non-Medical): No  Physical Activity: Insufficiently Active   Days of Exercise per Week: 5 days   Minutes of Exercise per Session: 20 min  Stress: No Stress Concern Present   Feeling of Stress : Not at all  Social Connections: Socially Integrated   Frequency of Communication with Friends and Family: More than three times a week   Frequency of Social Gatherings with Friends and Family: Once a week   Attends Religious Services: More than 4 times per year   Active Member of [employer] or Organizations: Yes   Attends Music therapist: More than 4 times per year   Marital Status: Married  Human resources officer Violence: Not At Risk   Fear of Current or Ex-Partner: No   Emotionally Abused: No   Physically Abused: No   Sexually Abused: No    Outpatient Medications Prior to Visit  Medication  Sig Dispense Refill   albuterol (VENTOLIN HFA) 108 (90 Base) MCG/ACT inhaler Inhale 2 puffs into the lungs daily as needed for wheezing or shortness of breath.     amLODipine (NORVASC) 10 MG tablet TAKE 1 TABLET EVERY DAY 90 tablet 1   aspirin EC 81 MG tablet Take 81 mg by mouth daily.     Cholecalciferol (VITAMIN D3) 400 units CAPS Take 2 capsules by mouth daily.     fluticasone (FLONASE) 50 MCG/ACT nasal spray Use 2 spray(s) in each nostril once daily 16 g 0   Insulin Pen Needle 32G X 4 MM MISC Use daily to inject insulin DX e11.65 150 each 4   LANTUS SOLOSTAR 100 UNIT/ML Solostar Pen INJECT 45 UNITS UNDER THE SKIN EVERY MORNING (DOSE INCREASE) 45 mL 3   montelukast (SINGULAIR) 10 MG tablet Take 1 tablet (10 mg total) by mouth at bedtime. 30 tablet 3   rosuvastatin (CRESTOR) 5 MG tablet TAKE 1 TABLET EVERY MONDAY, WEDNESDAY AND FRIDAY (DISCONTINUE  PRAVASTATIN) 36 tablet 3   spironolactone (ALDACTONE) 50 MG tablet TAKE 1 TABLET EVERY DAY 90 tablet 1   tadalafil (CIALIS) 10 MG tablet Take 1 tablet (10 mg total) by mouth daily as needed for erectile dysfunction. 10 tablet 1   TRUE METRIX BLOOD GLUCOSE TEST test strip TEST ONE TIME DAILY 100 strip 1   TRUEplus Lancets 28G MISC USE  TO  TEST ONE TIME DAILY 100 each 3   busPIRone (BUSPAR) 5 MG tablet TAKE 1 TABLET TWICE DAILY (Patient not taking: Reported on 07/06/2021) 180 tablet 1   FLUoxetine (PROZAC) 40 MG capsule TAKE 1 CAPSULE EVERY DAY (DOSE INCREASE) (Patient not taking: Reported on 07/06/2021) 90 capsule 3   benzonatate (TESSALON) 100 MG capsule Take 1 capsule (100 mg total) by mouth every 8 (eight) hours. (Patient not taking: Reported on 07/06/2021) 21 capsule 0   clotrimazole (MYCELEX) 10 MG troche Take 1 tablet (10 mg total) by mouth 5 (five) times daily. (Patient not taking: Reported on 07/06/2021) 70 tablet 0   ezetimibe (ZETIA) 10 MG tablet Take 1 tablet (10 mg total) by mouth daily. (Patient not taking: Reported on 07/06/2021) 90 tablet 3   Multiple Vitamins-Minerals (MULTI-BETIC DIABETES) TABS Take 1 tablet by mouth daily. (Patient not taking: Reported on 07/06/2021) 30 tablet 3   No facility-administered medications prior to visit.    Allergies  Allergen Reactions   Ibuprofen Rash   Lisinopril Cough   Other Other (See Comments)   Strawberry [Berry]     Review of Systems  Constitutional: Negative.   Respiratory: Negative.    Cardiovascular: Negative.   Neurological:  Positive for numbness.       To left foot and fingertips      Objective:    Physical Exam Constitutional:      Appearance: Normal appearance.  Cardiovascular:     Rate and Rhythm: Normal rate and regular rhythm.     Pulses: Normal pulses.     Heart sounds: Normal heart sounds.  Pulmonary:     Effort: Pulmonary effort is normal.     Breath sounds: Normal breath sounds.  Neurological:     General: No  focal deficit present.     Mental Status: He is alert and oriented to person, place, and time.     Sensory: Sensory deficit present.     Comments: To finger tips    BP 117/65 (BP Location: Left Arm, Patient Position: Sitting, Cuff Size: Large)   Pulse 100  Temp 99.5 F (37.5 C) (Oral)   Ht '5\' 8"'  (1.727 m)   Wt 209 lb (94.8 kg)   SpO2 93%   BMI 31.78 kg/m  Wt Readings from Last 3 Encounters:  07/06/21 209 lb (94.8 kg)  06/17/21 209 lb (94.8 kg)  04/03/21 214 lb (97.1 kg)    Health Maintenance Due  Topic Date Due   Zoster Vaccines- Shingrix (1 of 2) Never done   PNA vac Low Risk Adult (1 of 2 - PCV13) 04/05/2021   INFLUENZA VACCINE  06/08/2021    There are no preventive care reminders to display for this patient.   Lab Results  Component Value Date   TSH 2.100 06/10/2021   Lab Results  Component Value Date   WBC 7.4 03/07/2021   HGB 11.8 (L) 03/07/2021   HCT 34.2 (L) 03/07/2021   MCV 126.2 (H) 03/07/2021   PLT 377 03/07/2021   Lab Results  Component Value Date   NA 141 06/10/2021   K 5.2 06/10/2021   CO2 21 06/10/2021   GLUCOSE 232 (H) 06/10/2021   BUN 16 06/10/2021   CREATININE 1.57 (H) 06/10/2021   BILITOT 1.3 (H) 06/10/2021   ALKPHOS 83 06/10/2021   AST 35 06/10/2021   ALT 13 06/10/2021   PROT 7.3 06/10/2021   ALBUMIN 4.0 06/10/2021   CALCIUM 9.2 06/10/2021   ANIONGAP 6 03/07/2021   EGFR 49 (L) 06/10/2021   Lab Results  Component Value Date   CHOL 132 06/10/2021   Lab Results  Component Value Date   HDL 28 (L) 06/10/2021   Lab Results  Component Value Date   LDLCALC 76 06/10/2021   Lab Results  Component Value Date   TRIG 158 (H) 06/10/2021   Lab Results  Component Value Date   CHOLHDL 3.2 12/10/2020   Lab Results  Component Value Date   HGBA1C 11.7 (A) 06/17/2021       Assessment & Plan:   Problem List Items Addressed This Visit       Endocrine   Type 2 diabetes mellitus with diabetic neuropathy, unspecified ([place])     -Endocrinology manages DM meds -discussed dietary compliance d/t A1c increasing despite endocrinology treatment -recommend dietician/DM educator if he hasn't done this previously -Rx. gabapentin      Relevant Medications   gabapentin (NEURONTIN) 100 MG capsule     Meds ordered this encounter  Medications   gabapentin (NEURONTIN) 100 MG capsule    Sig: Take 1 capsule (100 mg total) by mouth 3 (three) times daily.    Dispense:  90 capsule    Refill:  [place], NP

## 2021-07-10 ENCOUNTER — Encounter (HOSPITAL_COMMUNITY): Payer: Self-pay

## 2021-07-10 ENCOUNTER — Other Ambulatory Visit: Payer: Self-pay

## 2021-07-10 ENCOUNTER — Emergency Department (HOSPITAL_COMMUNITY): Payer: Medicare HMO

## 2021-07-10 ENCOUNTER — Inpatient Hospital Stay (HOSPITAL_COMMUNITY)
Admission: EM | Admit: 2021-07-10 | Discharge: 2021-07-12 | DRG: 812 | Disposition: A | Discharge status: Home / Self Care | Payer: Medicare HMO | Attending: Internal Medicine | Admitting: Internal Medicine

## 2021-07-10 DIAGNOSIS — M1990 Unspecified osteoarthritis, unspecified site: Secondary | ICD-10-CM | POA: Diagnosis present

## 2021-07-10 DIAGNOSIS — Z794 Long term (current) use of insulin: Secondary | ICD-10-CM | POA: Diagnosis not present

## 2021-07-10 DIAGNOSIS — Z886 Allergy status to analgesic agent status: Secondary | ICD-10-CM | POA: Diagnosis not present

## 2021-07-10 DIAGNOSIS — E1140 Type 2 diabetes mellitus with diabetic neuropathy, unspecified: Secondary | ICD-10-CM | POA: Diagnosis present

## 2021-07-10 DIAGNOSIS — D631 Anemia in chronic kidney disease: Secondary | ICD-10-CM | POA: Diagnosis present

## 2021-07-10 DIAGNOSIS — Z823 Family history of stroke: Secondary | ICD-10-CM | POA: Diagnosis not present

## 2021-07-10 DIAGNOSIS — Z20822 Contact with and (suspected) exposure to covid-19: Secondary | ICD-10-CM | POA: Diagnosis present

## 2021-07-10 DIAGNOSIS — Z87891 Personal history of nicotine dependence: Secondary | ICD-10-CM

## 2021-07-10 DIAGNOSIS — Z91018 Allergy to other foods: Secondary | ICD-10-CM

## 2021-07-10 DIAGNOSIS — I129 Hypertensive chronic kidney disease with stage 1 through stage 4 chronic kidney disease, or unspecified chronic kidney disease: Secondary | ICD-10-CM | POA: Diagnosis present

## 2021-07-10 DIAGNOSIS — Z6830 Body mass index (BMI) 30.0-30.9, adult: Secondary | ICD-10-CM

## 2021-07-10 DIAGNOSIS — Z7982 Long term (current) use of aspirin: Secondary | ICD-10-CM | POA: Diagnosis not present

## 2021-07-10 DIAGNOSIS — R42 Dizziness and giddiness: Secondary | ICD-10-CM | POA: Diagnosis not present

## 2021-07-10 DIAGNOSIS — E1122 Type 2 diabetes mellitus with diabetic chronic kidney disease: Secondary | ICD-10-CM | POA: Diagnosis not present

## 2021-07-10 DIAGNOSIS — Z888 Allergy status to other drugs, medicaments and biological substances status: Secondary | ICD-10-CM | POA: Diagnosis not present

## 2021-07-10 DIAGNOSIS — E669 Obesity, unspecified: Secondary | ICD-10-CM | POA: Diagnosis not present

## 2021-07-10 DIAGNOSIS — E785 Hyperlipidemia, unspecified: Secondary | ICD-10-CM | POA: Diagnosis present

## 2021-07-10 DIAGNOSIS — N189 Chronic kidney disease, unspecified: Secondary | ICD-10-CM

## 2021-07-10 DIAGNOSIS — E782 Mixed hyperlipidemia: Secondary | ICD-10-CM | POA: Diagnosis present

## 2021-07-10 DIAGNOSIS — J42 Unspecified chronic bronchitis: Secondary | ICD-10-CM | POA: Diagnosis present

## 2021-07-10 DIAGNOSIS — K219 Gastro-esophageal reflux disease without esophagitis: Secondary | ICD-10-CM | POA: Diagnosis present

## 2021-07-10 DIAGNOSIS — N182 Chronic kidney disease, stage 2 (mild): Secondary | ICD-10-CM | POA: Diagnosis not present

## 2021-07-10 DIAGNOSIS — F419 Anxiety disorder, unspecified: Secondary | ICD-10-CM | POA: Diagnosis not present

## 2021-07-10 DIAGNOSIS — Z79899 Other long term (current) drug therapy: Secondary | ICD-10-CM

## 2021-07-10 DIAGNOSIS — N1831 Chronic kidney disease, stage 3a: Secondary | ICD-10-CM

## 2021-07-10 DIAGNOSIS — Z7984 Long term (current) use of oral hypoglycemic drugs: Secondary | ICD-10-CM | POA: Diagnosis not present

## 2021-07-10 DIAGNOSIS — D649 Anemia, unspecified: Secondary | ICD-10-CM | POA: Diagnosis present

## 2021-07-10 DIAGNOSIS — E1165 Type 2 diabetes mellitus with hyperglycemia: Secondary | ICD-10-CM | POA: Diagnosis not present

## 2021-07-10 DIAGNOSIS — I10 Essential (primary) hypertension: Secondary | ICD-10-CM

## 2021-07-10 DIAGNOSIS — E66811 Obesity, class 1: Secondary | ICD-10-CM | POA: Diagnosis present

## 2021-07-10 DIAGNOSIS — M1711 Unilateral primary osteoarthritis, right knee: Secondary | ICD-10-CM | POA: Diagnosis present

## 2021-07-10 DIAGNOSIS — F32A Depression, unspecified: Secondary | ICD-10-CM | POA: Diagnosis not present

## 2021-07-10 DIAGNOSIS — M79605 Pain in left leg: Secondary | ICD-10-CM | POA: Diagnosis not present

## 2021-07-10 DIAGNOSIS — D539 Nutritional anemia, unspecified: Secondary | ICD-10-CM | POA: Diagnosis present

## 2021-07-10 DIAGNOSIS — E1142 Type 2 diabetes mellitus with diabetic polyneuropathy: Secondary | ICD-10-CM | POA: Diagnosis present

## 2021-07-10 LAB — URINALYSIS, ROUTINE W REFLEX MICROSCOPIC
Bilirubin Urine: NEGATIVE
Glucose, UA: NEGATIVE mg/dL
Ketones, ur: NEGATIVE mg/dL
Leukocytes,Ua: NEGATIVE
Nitrite: NEGATIVE
Protein, ur: NEGATIVE mg/dL
Specific Gravity, Urine: 1.02 (ref 1.005–1.030)
pH: 5.5 (ref 5.0–8.0)

## 2021-07-10 LAB — POC OCCULT BLOOD, ED: Fecal Occult Bld: NEGATIVE

## 2021-07-10 LAB — CBC WITH DIFFERENTIAL/PLATELET
Basophils Absolute: 0 10*3/uL (ref 0.0–0.1)
Basophils Relative: 0 %
Eosinophils Absolute: 0.5 10*3/uL (ref 0.0–0.5)
Eosinophils Relative: 10 %
HCT: 20.2 % — ABNORMAL LOW (ref 39.0–52.0)
Hemoglobin: 6.9 g/dL — CL (ref 13.0–17.0)
Lymphocytes Relative: 29 %
Lymphs Abs: 1.5 10*3/uL (ref 0.7–4.0)
MCH: 41.6 pg — ABNORMAL HIGH (ref 26.0–34.0)
MCHC: 34.2 g/dL (ref 30.0–36.0)
MCV: 121.7 fL — ABNORMAL HIGH (ref 80.0–100.0)
Monocytes Absolute: 0 10*3/uL — ABNORMAL LOW (ref 0.1–1.0)
Monocytes Relative: 0 %
Neutro Abs: 3.2 10*3/uL (ref 1.7–7.7)
Neutrophils Relative %: 61 %
Platelets: 205 10*3/uL (ref 150–400)
RBC: 1.66 MIL/uL — ABNORMAL LOW (ref 4.22–5.81)
RDW: 20.5 % — ABNORMAL HIGH (ref 11.5–15.5)
WBC: 5.3 10*3/uL (ref 4.0–10.5)
nRBC: 1.1 % — ABNORMAL HIGH (ref 0.0–0.2)

## 2021-07-10 LAB — COMPREHENSIVE METABOLIC PANEL
ALT: 19 U/L (ref 0–44)
AST: 53 U/L — ABNORMAL HIGH (ref 15–41)
Albumin: 4 g/dL (ref 3.5–5.0)
Alkaline Phosphatase: 55 U/L (ref 38–126)
Anion gap: 6 (ref 5–15)
BUN: 34 mg/dL — ABNORMAL HIGH (ref 8–23)
CO2: 24 mmol/L (ref 22–32)
Calcium: 8.9 mg/dL (ref 8.9–10.3)
Chloride: 102 mmol/L (ref 98–111)
Creatinine, Ser: 1.63 mg/dL — ABNORMAL HIGH (ref 0.61–1.24)
GFR, Estimated: 46 mL/min — ABNORMAL LOW (ref >60)
Glucose, Bld: 113 mg/dL — ABNORMAL HIGH (ref 70–99)
Potassium: 5 mmol/L (ref 3.5–5.1)
Sodium: 132 mmol/L — ABNORMAL LOW (ref 135–145)
Total Bilirubin: 1.7 mg/dL — ABNORMAL HIGH (ref 0.3–1.2)
Total Protein: 8 g/dL (ref 6.5–8.1)

## 2021-07-10 LAB — RETICULOCYTES
Immature Retic Fract: 17.7 % — ABNORMAL HIGH (ref 2.3–15.9)
RBC.: 1.57 MIL/uL — ABNORMAL LOW (ref 4.22–5.81)
Retic Count, Absolute: 26.2 10*3/uL (ref 19.0–186.0)
Retic Ct Pct: 1.7 % (ref 0.4–3.1)

## 2021-07-10 LAB — ABO/RH: ABO/RH(D): O POS

## 2021-07-10 LAB — VITAMIN B12: Vitamin B-12: <50 pg/mL — ABNORMAL LOW (ref 180–914)

## 2021-07-10 LAB — CBG MONITORING, ED: Glucose-Capillary: 152 mg/dL — ABNORMAL HIGH (ref 70–99)

## 2021-07-10 LAB — URINALYSIS, MICROSCOPIC (REFLEX): Bacteria, UA: NONE SEEN

## 2021-07-10 LAB — RESP PANEL BY RT-PCR (FLU A&B, COVID) ARPGX2
Influenza A by PCR: NEGATIVE
Influenza B by PCR: NEGATIVE
SARS Coronavirus 2 by RT PCR: NEGATIVE

## 2021-07-10 LAB — IRON AND TIBC
Iron: 66 ug/dL (ref 45–182)
Saturation Ratios: 26 % (ref 17.9–39.5)
TIBC: 256 ug/dL (ref 250–450)
UIBC: 190 ug/dL

## 2021-07-10 LAB — FERRITIN: Ferritin: 473 ng/mL — ABNORMAL HIGH (ref 24–336)

## 2021-07-10 LAB — FOLATE: Folate: 24 ng/mL (ref >5.9)

## 2021-07-10 LAB — GLUCOSE, CAPILLARY: Glucose-Capillary: 113 mg/dL — ABNORMAL HIGH (ref 70–99)

## 2021-07-10 LAB — PREPARE RBC (CROSSMATCH)

## 2021-07-10 LAB — PROTIME-INR
INR: 1.1 (ref 0.8–1.2)
Prothrombin Time: 13.7 seconds (ref 11.4–15.2)

## 2021-07-10 MED ORDER — AMLODIPINE BESYLATE 5 MG PO TABS
10.0000 mg | ORAL_TABLET | Freq: Every day | ORAL | Status: DC
  Administered 2021-07-11 – 2021-07-12 (×2): 10 mg via ORAL
  Filled 2021-07-10 (×2): qty 2

## 2021-07-10 MED ORDER — SODIUM CHLORIDE 0.9 % IV SOLN
10.0000 mL/h | Freq: Once | INTRAVENOUS | Status: AC
  Administered 2021-07-10: 10 mL/h via INTRAVENOUS

## 2021-07-10 MED ORDER — INSULIN ASPART 100 UNIT/ML IJ SOLN: 0.0000 [IU] | Freq: Every day | INTRAMUSCULAR | Status: DC

## 2021-07-10 MED ORDER — GABAPENTIN 100 MG PO CAPS
100.0000 mg | ORAL_CAPSULE | Freq: Three times a day (TID) | ORAL | Status: DC
  Administered 2021-07-10 – 2021-07-12 (×5): 100 mg via ORAL
  Filled 2021-07-10 (×5): qty 1

## 2021-07-10 MED ORDER — MONTELUKAST SODIUM 10 MG PO TABS
10.0000 mg | ORAL_TABLET | Freq: Every day | ORAL | Status: DC
  Administered 2021-07-10 – 2021-07-11 (×2): 10 mg via ORAL
  Filled 2021-07-10 (×2): qty 1

## 2021-07-10 MED ORDER — SALINE SPRAY 0.65 % NA SOLN: 1.0000 | NASAL | Status: DC | PRN

## 2021-07-10 MED ORDER — SODIUM CHLORIDE 0.9 % IV SOLN: 250.0000 mL | INTRAVENOUS | Status: DC | PRN

## 2021-07-10 MED ORDER — ALBUTEROL SULFATE (2.5 MG/3ML) 0.083% IN NEBU: 3.0000 mL | INHALATION_SOLUTION | Freq: Every day | RESPIRATORY_TRACT | Status: DC | PRN

## 2021-07-10 MED ORDER — INSULIN ASPART 100 UNIT/ML IJ SOLN
0.0000 [IU] | Freq: Three times a day (TID) | INTRAMUSCULAR | Status: DC
  Administered 2021-07-11: 2 [IU] via SUBCUTANEOUS
  Administered 2021-07-12: 1 [IU] via SUBCUTANEOUS

## 2021-07-10 MED ORDER — SODIUM CHLORIDE 0.9% FLUSH
3.0000 mL | Freq: Two times a day (BID) | INTRAVENOUS | Status: DC
  Administered 2021-07-10 – 2021-07-12 (×4): 3 mL via INTRAVENOUS

## 2021-07-10 MED ORDER — INSULIN GLARGINE-YFGN 100 UNIT/ML ~~LOC~~ SOLN
10.0000 [IU] | Freq: Every day | SUBCUTANEOUS | Status: DC
  Administered 2021-07-11: 10 [IU] via SUBCUTANEOUS
  Filled 2021-07-10 (×2): qty 0.1

## 2021-07-10 MED ORDER — SODIUM CHLORIDE 0.9 % IV BOLUS
500.0000 mL | Freq: Once | INTRAVENOUS | Status: AC
  Administered 2021-07-10: 500 mL via INTRAVENOUS

## 2021-07-10 MED ORDER — SODIUM CHLORIDE 0.9% FLUSH: 3.0000 mL | INTRAVENOUS | Status: DC | PRN

## 2021-07-10 NOTE — ED Notes (Signed)
Pt unable to provide stool sample at this time for OVA + Parasite exam.

## 2021-07-10 NOTE — H&P (Signed)
History and Physical  Norman Peters [id number] DOB: Mar 25, 1956 DOA: 07/10/2021  Referring physician: Margette Fast, MD PCP: Fayrene Helper, MD  Patient coming from: Home  Chief Complaint: Dizziness  HPI: Norman Peters is a 65 y.o. male with medical history significant for hypertension, hyperlipidemia, T2DM, peripheral neuropathy, COPD, anxiety who presents to the emergency department due to 1 week onset of generalized weakness, lightheadedness, left leg pain and sensation of possible stool parasites.  Patient complained of increased fatigue and unpleasant dreams which started after he started taking gabapentin prescribed by his PCP due to left leg tingling/pain sensation.  He has noticed no improvement in left leg/calf pain with radiation to the foot.  Patient continues to be concerned of possible anal parasitic infection and he has started to take over-the-counter medication from the pharmacy.  He complained of lightheadedness, but denies chest pain, shortness of breath, fever, chills, nausea or vomiting.  ED Course:  In the emergency department, was hemodynamically stable.  Work-up in the ED showed macrocytic anemia, H/H 6.9/20.2 (this was 11.8/34.2 on 03/07/2021).  BUN to creatinine 34/1.63 (baseline creatinine at 1.3-1.5), AST 53, T bili 1.7.  Influenza A, B, SARS coronavirus 2 was negative.  Urinalysis was unimpressive for UTI.  FOBT was negative. Left lower extremity venous Doppler ultrasound showed no evidence of DVT in the LLE Type and screen was done, 1 unit of blood transfusion was ordered, IV hydration was provided.  Hospitalist was asked to admit patient for further evaluation and management.  Review of Systems: Constitutional: Negative for chills and fever.  HENT: Negative for ear pain and sore throat.   Eyes: Negative for pain and visual disturbance.  Respiratory: Negative for cough, chest tightness and shortness of breath.   Cardiovascular: Negative for chest pain and  palpitations.  Gastrointestinal: Concerned about parasitic infection.  Negative for abdominal pain and vomiting.  Endocrine: Negative for polyphagia and polyuria.  Genitourinary: Negative for decreased urine volume, dysuria, enuresis Musculoskeletal: Positive for left leg pain.  Negative for back pain.  Skin: Negative for color change and rash.  Allergic/Immunologic: Negative for immunocompromised state.  Neurological: Positive for weakness, lightheadedness.  Negative for tremors, syncope, speech difficulty Hematological: Does not bruise/bleed easily.  All other systems reviewed and are negative   Past Medical History:  Diagnosis Date   Acute left-sided low back pain with left-sided sciatica 11/04/2020   Anxiety    Arthritis    Depression 2001   Diabetes mellitus 2010   GERD (gastroesophageal reflux disease)    Hyperlipidemia    Hypertension 2005   Past Surgical History:  Procedure Laterality Date   COLONOSCOPY N/A 07/07/2016   Procedure: COLONOSCOPY;  Surgeon: Rogene Houston, MD;  Location: [facility];  Service: Endoscopy;  Laterality: N/A;  1030   FINGER SURGERY Left 1974   fifth , trauma on job, had to replace/repair finger   HERNIA REPAIR  [id number]   x3, umbilical and bilateral hernia    Social History:  reports that he quit smoking about 10 years ago. His smoking use included cigarettes. He has a 15.00 pack-year smoking history. He has never used smokeless tobacco. He reports that he does not drink alcohol and does not use drugs.   Allergies  Allergen Reactions   Ibuprofen Rash   Lisinopril Cough   Other Other (See Comments)   Strawberry [Berry]     Family History  Problem Relation Age of Onset   Stroke Mother      Prior to Admission medications  Medication Sig Start Date End Date Taking? Authorizing Provider  albuterol (VENTOLIN HFA) 108 (90 Base) MCG/ACT inhaler Inhale 2 puffs into the lungs daily as needed for wheezing or shortness of breath.   Yes [provider]  amLODipine (NORVASC) 10 MG tablet TAKE 1 TABLET EVERY DAY 06/24/21  Yes Fayrene Helper, MD  aspirin EC 81 MG tablet Take 81 mg by mouth daily. 03/08/16  Yes Fayrene Helper, MD  chlorhexidine (PERIDEX) 0.12 % solution 15 mLs 2 (two) times daily. 06/01/21  Yes [provider]  Doxylamine-Phenylephrine-APAP Cardell Peach SINEX DAYQUIL/NYQUIL PO) Take 1 tablet by mouth daily as needed.   Yes [provider]  gabapentin (NEURONTIN) 100 MG capsule Take 1 capsule (100 mg total) by mouth 3 (three) times daily. 07/06/21  Yes Noreene Larsson, NP  LANTUS SOLOSTAR 100 UNIT/ML Solostar Pen INJECT 45 UNITS UNDER THE SKIN EVERY MORNING (DOSE INCREASE) Patient taking differently: Inject 45 Units into the skin every evening. 2330 06/24/21  Yes Fayrene Helper, MD  montelukast (SINGULAIR) 10 MG tablet Take 1 tablet (10 mg total) by mouth at bedtime. 12/11/20  Yes Fayrene Helper, MD  sodium chloride (OCEAN) 0.65 % SOLN nasal spray Place 1 spray into both nostrils as needed for congestion.   Yes [provider]  spironolactone (ALDACTONE) 50 MG tablet TAKE 1 TABLET EVERY DAY 06/24/21  Yes Fayrene Helper, MD  busPIRone (BUSPAR) 5 MG tablet TAKE 1 TABLET TWICE DAILY Patient not taking: No sig reported 08/16/19   Fayrene Helper, MD  Cholecalciferol (VITAMIN D3) 400 units CAPS Take 2 capsules by mouth daily. Patient not taking: No sig reported    [provider]  FLUoxetine (PROZAC) 40 MG capsule TAKE 1 CAPSULE EVERY DAY (DOSE INCREASE) Patient not taking: No sig reported 06/04/21   Fayrene Helper, MD  fluticasone [facility]) 50 MCG/ACT nasal spray Use 2 spray(s) in each nostril once daily Patient not taking: No sig reported 02/06/21   Noreene Larsson, NP  Insulin Pen Needle 32G X 4 MM MISC Use daily to inject insulin DX e11.65 08/26/20   Fayrene Helper, MD  metFORMIN (GLUCOPHAGE) 500 MG tablet Take 500 mg by mouth 2 (two) times daily. Patient not  taking: No sig reported 03/31/21   [provider]  rosuvastatin (CRESTOR) 5 MG tablet TAKE 1 TABLET EVERY MONDAY, WEDNESDAY AND FRIDAY (DISCONTINUE PRAVASTATIN) Patient not taking: No sig reported 06/01/21   Fayrene Helper, MD  tadalafil (CIALIS) 10 MG tablet Take 1 tablet (10 mg total) by mouth daily as needed for erectile dysfunction. Patient not taking: No sig reported 03/28/19   Perlie Mayo, NP  TRUE METRIX BLOOD GLUCOSE TEST test strip TEST ONE TIME DAILY 06/24/21   Fayrene Helper, MD  TRUEplus Lancets 28G MISC USE  TO  TEST ONE TIME DAILY 10/30/20   Fayrene Helper, MD    Physical Exam: BP 129/66   Pulse 92   Temp 99 F (37.2 C) (Oral)   Resp (!) 21   Ht '5\' 8"'$  (1.727 m)   Wt 89.8 kg   SpO2 97%   BMI 30.11 kg/m   General: 66 y.o. year-old male well developed well nourished in no acute distress.  Alert and oriented x3. HEENT: Pale conjunctiva.  NCAT, EOMI Neck: Supple, trachea medial Cardiovascular: Regular rate and rhythm with no rubs or gallops.  No thyromegaly or JVD noted.  No lower extremity edema. 2/4 pulses in all 4 extremities. Respiratory:  Clear to auscultation with no wheezes or rales. Good inspiratory effort. Abdomen: Soft, nontender nondistended with normal bowel sounds x4 quadrants. Muskuloskeletal: No cyanosis, clubbing or edema noted bilaterally Neuro: CN II-XII intact, strength 5/5 x 4, sensation, reflexes intact Skin: No ulcerative lesions noted or rashes Psychiatry: Mood is appropriate for condition and setting          Labs on Admission:  Basic Metabolic Panel: Recent Labs  Lab 07/10/21 1554  NA 132*  K 5.0  CL 102  CO2 24  GLUCOSE 113*  BUN 34*  CREATININE 1.63*  CALCIUM 8.9   Liver Function Tests: Recent Labs  Lab 07/10/21 1554  AST 53*  ALT 19  ALKPHOS 55  BILITOT 1.7*  PROT 8.0  ALBUMIN 4.0   No results for input(s): LIPASE, AMYLASE in the last 168 hours. No results for input(s): AMMONIA in the last 168  hours. CBC: Recent Labs  Lab 07/10/21 1554  WBC 5.3  NEUTROABS 3.2  HGB 6.9*  HCT 20.2*  MCV 121.7*  PLT 205   Cardiac Enzymes: No results for input(s): CKTOTAL, CKMB, CKMBINDEX, TROPONINI in the last 168 hours.  BNP (last 3 results) No results for input(s): BNP in the last 8760 hours.  ProBNP (last 3 results) No results for input(s): PROBNP in the last 8760 hours.  CBG: Recent Labs  Lab 07/10/21 1426  GLUCAP 152*    Radiological Exams on Admission: US Venous Img Lower  Left (DVT Study)  Result Date: 07/10/2021 CLINICAL DATA:  leg pain EXAM: LEFT LOWER EXTREMITY VENOUS DOPPLER ULTRASOUND TECHNIQUE: Gray-scale sonography with compression, as well as color and duplex ultrasound, were performed to evaluate the deep venous system(s) from the level of the common femoral vein through the popliteal and proximal calf veins. COMPARISON:  None. FINDINGS: VENOUS Normal compressibility of the common femoral, superficial femoral, and popliteal veins, as well as the visualized calf veins. Visualized portions of profunda femoral vein and great saphenous vein unremarkable. No filling defects to suggest DVT on grayscale or color Doppler imaging. Doppler waveforms show normal direction of venous flow, normal respiratory plasticity and response to augmentation. Limited views of the contralateral common femoral vein are unremarkable. IMPRESSION: No evidence of DVT in the left lower extremity. Electronically Signed   By: Margaretha Sheffield M.D.   On: 07/10/2021 17:21    EKG: I independently viewed the EKG done and my findings are as followed: Normal sinus rhythm at a rate of 86 bpm  Assessment/Plan Present on Admission:  Symptomatic anemia  Essential hypertension, benign  Anxiety and depression  Obesity (BMI 30.0-34.9)  Mixed hyperlipidemia  Principal Problem:   Symptomatic anemia Active Problems:   Essential hypertension, benign   Anxiety and depression   Mixed hyperlipidemia   Obesity  (BMI 30.0-34.9)   Type 2 diabetes mellitus with stage 3a chronic kidney disease, with long-term current use of insulin (HCC)   Chronic kidney disease  Symptomatic anemia H/H 6.9/20.2 (this was 11.8/34.2 on 03/07/2021) Hemoccult was negative Type and crossmatch was  done in the ED 1 unit of PRBC will be transfused  Macrocytic anemia MCV 121.7, vitamin B12 and folate levels will be checked  Questionable pinworm infection Patient complained of possible anal parasitic infection Ova and parasites pending  CKD stage IIIA BUN to creatinine 34/1.63 (baseline creatinine at 1.3-1.5) Renally adjust medications, avoid nephrotoxic agents/dehydration/hypotension  Type 2 diabetes mellitus Hemoglobin A1c done on 06/17/2021 was 11.7 Continue ISS and hypoglycemia protocol Continue Lantus 10 units nightly (home dose at 45  units) and titrate dose accordingly  Essential hypertension Continue amlodipine  Hyperlipidemia Patient was not taking any statin at this time per med rec We shall await updated med rec  COPD (chronic bronchitis) Continue albuterol and Singulair  Obesity (30.11) Patient counseled on diet and lifestyle modification   DVT prophylaxis: SCDs  Code Status: Full code  Family Communication: None at bedside  Disposition Plan:  Patient is from:                        home Anticipated DC to:                   SNF or family members home Anticipated DC date:               2-3 days Anticipated DC barriers:          Patient requires inpatient management due to symptomatic anemia requiring blood transfusion   Consults called: None  Admission status: Observation    Bernadette Hoit MD [facility]  07/10/2021, 6:39 PM

## 2021-07-10 NOTE — ED Provider Notes (Signed)
Emergency Department Provider Note   I have reviewed the triage vital signs and the nursing notes.   HISTORY  Chief Complaint Dizziness   HPI Norman Peters is a 65 y.o. male with PMH of DM, Neuropathy, HLD, and HTN presents to the emergency department with generalized weakness, lightheadedness, left leg pain, and concern for possible stool parasites.  Patient states symptoms began about 1 week ago.  He went to see his primary care doctor regarding the tingling/pain in the left leg and was prescribed gabapentin which she is started taking.  He states that since starting his medication he has had some fatigue along with "crazy dreams" which are unusual for him. No hallucinations while awake. He continues to have pain in the left knee/calf area radiating to the foot. No claudication symptoms. No joint swelling/redness. Denies fever. No CP or SOB. Today he began feeling lightheaded. Denies vertigo. No speech or vision change.   He also notes concern for possible pinworm infection. He has not seen worms but feels like something in "moving" in that area. He reports starting OTC medications from the pharmacy for this.    Past Medical History:  Diagnosis Date   Acute left-sided low back pain with left-sided sciatica 11/04/2020   Anxiety    Arthritis    Depression 2001   Diabetes mellitus 2010   GERD (gastroesophageal reflux disease)    Hyperlipidemia    Hypertension 2005    Patient Active Problem List   Diagnosis Date Noted   Symptomatic anemia 07/10/2021   Chronic kidney disease 07/10/2021   Type 2 diabetes mellitus with diabetic neuropathy, unspecified ([place]) 07/06/2021   Seasonal allergies 09/12/2020   Nasal congestion 09/12/2020   Osteoarthritis of right knee 09/02/2020   Type 2 diabetes mellitus with stage 3a chronic kidney disease, with Mei Suits-term current use of insulin ([place]) 02/05/2020   Vitamin D deficiency 01/11/2020   Obesity (BMI 30.0-34.9) 09/03/2019   Dental caries  09/03/2019   Chronic elbow pain, left 06/23/2019   Type 2 diabetes mellitus with hyperglycemia ([place]) 05/03/2019   Chest pain with moderate risk of acute coronary syndrome 09/03/2018   Mixed hyperlipidemia 05/29/2017   Spasm of thoracic back muscle 10/06/2016   Pain in joint involving multiple sites 02/12/2016   GERD (gastroesophageal reflux disease) 02/12/2016   Essential hypertension, benign 02/10/2016   Anxiety and depression 02/10/2016    Past Surgical History:  Procedure Laterality Date   COLONOSCOPY N/A 07/07/2016   Procedure: COLONOSCOPY;  Surgeon: Rogene Houston, MD;  Location: [facility];  Service: Endoscopy;  Laterality: N/A;  1030   FINGER SURGERY Left 1974   fifth , trauma on job, had to replace/repair finger   HERNIA REPAIR  [id number]   x3, umbilical and bilateral hernia    Allergies Ibuprofen, Lisinopril, Metformin and related, Other, and Strawberry [berry]  Family History  Problem Relation Age of Onset   Stroke Mother     Social History Social History   Tobacco Use   Smoking status: Former    Packs/day: 0.50    Years: 30.00    Pack years: 15.00    Types: Cigarettes    Quit date: 01/20/2011    Years since quitting: 10.4   Smokeless tobacco: Never  Vaping Use   Vaping Use: Never used  Substance Use Topics   Alcohol use: No   Drug use: No    Review of Systems  Constitutional: No fever/chills. Positive fatigue.  Eyes: No visual changes. ENT: No sore throat. Cardiovascular:  Denies chest pain. Positive lightheadedness.  Respiratory: Denies shortness of breath. Gastrointestinal: No abdominal pain.  No nausea, no vomiting.  No diarrhea.  No constipation. ? Pinworm infection.  Genitourinary: Negative for dysuria. Musculoskeletal: Negative for back pain. Positive left leg pain.  Skin: Negative for rash. Neurological: Negative for headaches, focal weakness or numbness.  10-point ROS otherwise  negative.  ____________________________________________   PHYSICAL EXAM:  VITAL SIGNS: ED Triage Vitals  Enc Vitals Group     BP 07/10/21 1424 124/62     Pulse Rate 07/10/21 1424 92     Resp 07/10/21 1424 16     Temp 07/10/21 1424 99.1 F (37.3 C)     Temp Source 07/10/21 1424 Oral     SpO2 07/10/21 1424 97 %     Weight 07/10/21 1423 198 lb (89.8 kg)     Height 07/10/21 1423 '5\' 8"'$  (1.727 m)    Constitutional: Alert and oriented. Well appearing and in no acute distress. Eyes: Conjunctivae are normal.  Head: Atraumatic. Nose: No congestion/rhinnorhea. Mouth/Throat: Mucous membranes are moist.   Neck: No stridor.   Cardiovascular: Normal rate, regular rhythm. Good peripheral circulation. 2+ DP pulse on left. Grossly normal heart sounds.   Respiratory: Normal respiratory effort.  No retractions. Lungs CTAB. Gastrointestinal: Soft and nontender. No distention.  Musculoskeletal: No lower extremity tenderness nor edema. No gross deformities of extremities. Neurologic:  Normal speech and language. No gross focal neurologic deficits are appreciated. 5/5 strength in the bilateral upper/lower extremities with normal sensation.  Skin:  Skin is warm, dry and intact. No rash noted.  ____________________________________________   LABS (all labs ordered are listed, but only abnormal results are displayed)  Labs Reviewed  COMPREHENSIVE METABOLIC PANEL - Abnormal; Notable for the following components:      Result Value   Sodium 132 (*)    Glucose, Bld 113 (*)    BUN 34 (*)    Creatinine, Ser 1.63 (*)    AST 53 (*)    Total Bilirubin 1.7 (*)    GFR, Estimated 46 (*)    All other components within normal limits  CBC WITH DIFFERENTIAL/PLATELET - Abnormal; Notable for the following components:   RBC 1.66 (*)    Hemoglobin 6.9 (*)    HCT 20.2 (*)    MCV 121.7 (*)    MCH 41.6 (*)    RDW 20.5 (*)    nRBC 1.1 (*)    Monocytes Absolute 0.0 (*)    All other components within normal  limits  URINALYSIS, ROUTINE W REFLEX MICROSCOPIC - Abnormal; Notable for the following components:   Hgb urine dipstick TRACE (*)    All other components within normal limits  VITAMIN B12 - Abnormal; Notable for the following components:   Vitamin B-12 <50 (*)    All other components within normal limits  FERRITIN - Abnormal; Notable for the following components:   Ferritin 473 (*)    All other components within normal limits  RETICULOCYTES - Abnormal; Notable for the following components:   RBC. 1.57 (*)    Immature Retic Fract 17.7 (*)    All other components within normal limits  VITAMIN B12 - Abnormal; Notable for the following components:   Vitamin B-12 <50 (*)    All other components within normal limits  COMPREHENSIVE METABOLIC PANEL - Abnormal; Notable for the following components:   BUN 30 (*)    Creatinine, Ser 1.52 (*)    Total Bilirubin 1.6 (*)    GFR, Estimated  51 (*)    All other components within normal limits  CBC - Abnormal; Notable for the following components:   RBC 1.86 (*)    Hemoglobin 7.3 (*)    HCT 21.9 (*)    MCV 117.7 (*)    MCH 39.2 (*)    RDW 23.2 (*)    nRBC 1.4 (*)    All other components within normal limits  MAGNESIUM - Abnormal; Notable for the following components:   Magnesium 2.5 (*)    All other components within normal limits  GLUCOSE, CAPILLARY - Abnormal; Notable for the following components:   Glucose-Capillary 113 (*)    All other components within normal limits  COMPREHENSIVE METABOLIC PANEL - Abnormal; Notable for the following components:   BUN 30 (*)    Creatinine, Ser 1.48 (*)    AST 44 (*)    Total Bilirubin 2.1 (*)    GFR, Estimated 52 (*)    All other components within normal limits  GLUCOSE, CAPILLARY - Abnormal; Notable for the following components:   Glucose-Capillary 157 (*)    All other components within normal limits  CBC - Abnormal; Notable for the following components:   RBC 1.93 (*)    Hemoglobin 7.4 (*)     HCT 22.4 (*)    MCV 116.1 (*)    MCH 38.3 (*)    RDW 22.6 (*)    nRBC 1.6 (*)    All other components within normal limits  COMPREHENSIVE METABOLIC PANEL - Abnormal; Notable for the following components:   Glucose, Bld 104 (*)    BUN 32 (*)    Creatinine, Ser 1.56 (*)    AST 60 (*)    Total Bilirubin 1.7 (*)    GFR, Estimated 49 (*)    All other components within normal limits  VITAMIN B12 - Abnormal; Notable for the following components:   Vitamin B-12 <50 (*)    All other components within normal limits  IRON AND TIBC - Abnormal; Notable for the following components:   TIBC 242 (*)    All other components within normal limits  FERRITIN - Abnormal; Notable for the following components:   Ferritin 467 (*)    All other components within normal limits  RETICULOCYTES - Abnormal; Notable for the following components:   RBC. 1.91 (*)    Immature Retic Fract 17.5 (*)    All other components within normal limits  GLUCOSE, CAPILLARY - Abnormal; Notable for the following components:   Glucose-Capillary 109 (*)    All other components within normal limits  GLUCOSE, CAPILLARY - Abnormal; Notable for the following components:   Glucose-Capillary 101 (*)    All other components within normal limits  GLUCOSE, CAPILLARY - Abnormal; Notable for the following components:   Glucose-Capillary 147 (*)    All other components within normal limits  CBG MONITORING, ED - Abnormal; Notable for the following components:   Glucose-Capillary 152 (*)    All other components within normal limits  OVA + PARASITE EXAM  RESP PANEL BY RT-PCR (FLU A&B, COVID) ARPGX2  URINALYSIS, MICROSCOPIC (REFLEX)  PROTIME-INR  FOLATE  IRON AND TIBC  FOLATE  PROTIME-INR  APTT  PHOSPHORUS  HIV ANTIBODY (ROUTINE TESTING W REFLEX)  GLUCOSE, CAPILLARY  GLUCOSE, CAPILLARY  MAGNESIUM  PHOSPHORUS  FOLATE  PATHOLOGIST SMEAR REVIEW  POC OCCULT BLOOD, ED  TYPE AND SCREEN  PREPARE RBC (CROSSMATCH)  ABO/RH    ____________________________________________  EKG   EKG Interpretation  Date/Time:  Friday July 10 2021 15:39:06 EDT Ventricular Rate:  86 PR Interval:  147 QRS Duration: 90 QT Interval:  379 QTC Calculation: [id number] R Axis:   56 Text Interpretation: Sinus rhythm Anteroseptal infarct, age indeterminate No significant change since last tracing Confirmed by Nanda Quinton [phone]) on 07/10/2021 5:12:38 PM        ____________________________________________  RADIOLOGY  DVT US reviewed.   ____________________________________________   PROCEDURES  Procedure(s) performed:   Procedures  CRITICAL CARE Performed by: Margette Fast Total critical care time: 35 minutes Critical care time was exclusive of separately billable procedures and treating other patients. Critical care was necessary to treat or prevent imminent or life-threatening deterioration. Critical care was time spent personally by me on the following activities: development of treatment plan with patient and/or surrogate as well as nursing, discussions with consultants, evaluation of patient's response to treatment, examination of patient, obtaining history from patient or surrogate, ordering and performing treatments and interventions, ordering and review of laboratory studies, ordering and review of radiographic studies, pulse oximetry and re-evaluation of patient's condition.  Nanda Quinton, MD Emergency Medicine  ____________________________________________   INITIAL IMPRESSION / ASSESSMENT AND PLAN / ED COURSE  Pertinent labs & imaging results that were available during my care of the patient were reviewed by me and considered in my medical decision making (see chart for details).   Patient presents emergency department with lightheadedness/dizziness along with leg pain and also concern for possible parasitic infection.  Discussed that if he is able to provide a stool sample we can send his stool sample for  testing for pinworms.  Feeling more of a movement type sensation rather than actually seeing physical worms.  Abdomen is diffusely soft and nontender.  In terms of the patient's leg pain this seems more consistent with neuropathy.  Plan for DVT ultrasound.  Patient is not having claudication symptoms and does have palpable pulses in the lower extremities and so this does not seem consistent with ischemic pain.  No findings on exam to suspect septic joint or injury requiring x-ray.  Plan for screening blood work as patient just feeling generally unwell and we will send a COVID test.  Question if some of the dream symptoms could be related to recently starting gabapentin and he will discuss further with his PCP.  If unable to provide a stool sample here discussed that he can also follow-up with his PCP regarding pinworm testing/treatment.   Labs resulted with Hb low at 6.9. Hemoccult negative. Sending anemia panel and discussed blood transfusion. Patient is in agreement. Plan for admit.   Discussed patient's case with [facility] to request admission. Patient and family (if present) updated with plan. Care transferred to [facility] service.  I reviewed all nursing notes, vitals, pertinent old records, EKGs, labs, imaging (as available).  ____________________________________________  FINAL CLINICAL IMPRESSION(S) / ED DIAGNOSES  Final diagnoses:  Symptomatic anemia  Left leg pain     MEDICATIONS GIVEN DURING THIS VISIT:  Medications  sodium chloride 0.9 % bolus 500 mL (500 mLs Intravenous Bolus 07/10/21 1747)  0.9 %  sodium chloride infusion (0 mL/hr Intravenous Stopped 07/10/21 2130)     NEW OUTPATIENT MEDICATIONS STARTED DURING THIS VISIT:  Discharge Medication List as of 07/12/2021  3:34 PM     START taking these medications   Details  insulin aspart (NOVOLOG FLEXPEN) 100 UNIT/ML FlexPen Inject 5 Units into the skin 3 (three) times daily with meals., Starting Sun 07/12/2021, Normal        Note:  This  document was prepared using Dragon voice recognition software and may include unintentional dictation errors.  Nanda Quinton, MD, [facility] Emergency Medicine    Donie Lemelin, [facility], MD 07/13/21 [id number]

## 2021-07-10 NOTE — ED Triage Notes (Signed)
Pt presents to ED with multiple complaints. Pt states he has been dizzy and sweaty for a couple of weeks, also having left foot and leg pain. Pt states "I feel like I have worms"

## 2021-07-11 DIAGNOSIS — E1122 Type 2 diabetes mellitus with diabetic chronic kidney disease: Secondary | ICD-10-CM | POA: Diagnosis present

## 2021-07-11 DIAGNOSIS — Z888 Allergy status to other drugs, medicaments and biological substances status: Secondary | ICD-10-CM | POA: Diagnosis not present

## 2021-07-11 DIAGNOSIS — E669 Obesity, unspecified: Secondary | ICD-10-CM | POA: Diagnosis present

## 2021-07-11 DIAGNOSIS — E1165 Type 2 diabetes mellitus with hyperglycemia: Secondary | ICD-10-CM | POA: Diagnosis not present

## 2021-07-11 DIAGNOSIS — Z823 Family history of stroke: Secondary | ICD-10-CM | POA: Diagnosis not present

## 2021-07-11 DIAGNOSIS — D631 Anemia in chronic kidney disease: Secondary | ICD-10-CM | POA: Diagnosis present

## 2021-07-11 DIAGNOSIS — F419 Anxiety disorder, unspecified: Secondary | ICD-10-CM | POA: Diagnosis not present

## 2021-07-11 DIAGNOSIS — Z20822 Contact with and (suspected) exposure to covid-19: Secondary | ICD-10-CM | POA: Diagnosis present

## 2021-07-11 DIAGNOSIS — Z6830 Body mass index (BMI) 30.0-30.9, adult: Secondary | ICD-10-CM | POA: Diagnosis not present

## 2021-07-11 DIAGNOSIS — D649 Anemia, unspecified: Secondary | ICD-10-CM | POA: Diagnosis present

## 2021-07-11 DIAGNOSIS — Z7982 Long term (current) use of aspirin: Secondary | ICD-10-CM | POA: Diagnosis not present

## 2021-07-11 DIAGNOSIS — N182 Chronic kidney disease, stage 2 (mild): Secondary | ICD-10-CM

## 2021-07-11 DIAGNOSIS — Z886 Allergy status to analgesic agent status: Secondary | ICD-10-CM | POA: Diagnosis not present

## 2021-07-11 DIAGNOSIS — E782 Mixed hyperlipidemia: Secondary | ICD-10-CM | POA: Diagnosis present

## 2021-07-11 DIAGNOSIS — N1831 Chronic kidney disease, stage 3a: Secondary | ICD-10-CM | POA: Diagnosis present

## 2021-07-11 DIAGNOSIS — Z91018 Allergy to other foods: Secondary | ICD-10-CM | POA: Diagnosis not present

## 2021-07-11 DIAGNOSIS — I129 Hypertensive chronic kidney disease with stage 1 through stage 4 chronic kidney disease, or unspecified chronic kidney disease: Secondary | ICD-10-CM | POA: Diagnosis present

## 2021-07-11 DIAGNOSIS — F32A Depression, unspecified: Secondary | ICD-10-CM | POA: Diagnosis present

## 2021-07-11 DIAGNOSIS — Z87891 Personal history of nicotine dependence: Secondary | ICD-10-CM | POA: Diagnosis not present

## 2021-07-11 DIAGNOSIS — E1140 Type 2 diabetes mellitus with diabetic neuropathy, unspecified: Secondary | ICD-10-CM | POA: Diagnosis present

## 2021-07-11 DIAGNOSIS — I10 Essential (primary) hypertension: Secondary | ICD-10-CM | POA: Diagnosis not present

## 2021-07-11 DIAGNOSIS — M1990 Unspecified osteoarthritis, unspecified site: Secondary | ICD-10-CM | POA: Diagnosis present

## 2021-07-11 DIAGNOSIS — E1142 Type 2 diabetes mellitus with diabetic polyneuropathy: Secondary | ICD-10-CM | POA: Diagnosis present

## 2021-07-11 DIAGNOSIS — J42 Unspecified chronic bronchitis: Secondary | ICD-10-CM | POA: Diagnosis present

## 2021-07-11 DIAGNOSIS — Z7984 Long term (current) use of oral hypoglycemic drugs: Secondary | ICD-10-CM | POA: Diagnosis not present

## 2021-07-11 DIAGNOSIS — D539 Nutritional anemia, unspecified: Secondary | ICD-10-CM | POA: Diagnosis present

## 2021-07-11 DIAGNOSIS — M1711 Unilateral primary osteoarthritis, right knee: Secondary | ICD-10-CM | POA: Diagnosis present

## 2021-07-11 DIAGNOSIS — K219 Gastro-esophageal reflux disease without esophagitis: Secondary | ICD-10-CM | POA: Diagnosis present

## 2021-07-11 LAB — BPAM RBC
Blood Product Expiration Date: 202210062359
ISSUE DATE / TIME: 202209021844
Unit Type and Rh: 5100

## 2021-07-11 LAB — TYPE AND SCREEN
ABO/RH(D): O POS
Antibody Screen: NEGATIVE
Unit division: 0

## 2021-07-11 LAB — COMPREHENSIVE METABOLIC PANEL
ALT: 17 U/L (ref 0–44)
ALT: 19 U/L (ref 0–44)
AST: 40 U/L (ref 15–41)
AST: 44 U/L — ABNORMAL HIGH (ref 15–41)
Albumin: 3.7 g/dL (ref 3.5–5.0)
Albumin: 4.1 g/dL (ref 3.5–5.0)
Alkaline Phosphatase: 50 U/L (ref 38–126)
Alkaline Phosphatase: 54 U/L (ref 38–126)
Anion gap: 8 (ref 5–15)
Anion gap: 8 (ref 5–15)
BUN: 30 mg/dL — ABNORMAL HIGH (ref 8–23)
BUN: 30 mg/dL — ABNORMAL HIGH (ref 8–23)
CO2: 27 mmol/L (ref 22–32)
CO2: 27 mmol/L (ref 22–32)
Calcium: 9.4 mg/dL (ref 8.9–10.3)
Calcium: 9.4 mg/dL (ref 8.9–10.3)
Chloride: 103 mmol/L (ref 98–111)
Chloride: 102 mmol/L (ref 98–111)
Creatinine, Ser: 1.52 mg/dL — ABNORMAL HIGH (ref 0.61–1.24)
Creatinine, Ser: 1.48 mg/dL — ABNORMAL HIGH (ref 0.61–1.24)
GFR, Estimated: 51 mL/min — ABNORMAL LOW (ref >60)
GFR, Estimated: 52 mL/min — ABNORMAL LOW (ref >60)
Glucose, Bld: 81 mg/dL (ref 70–99)
Glucose, Bld: 96 mg/dL (ref 70–99)
Potassium: 4.8 mmol/L (ref 3.5–5.1)
Potassium: 4.8 mmol/L (ref 3.5–5.1)
Sodium: 138 mmol/L (ref 135–145)
Sodium: 137 mmol/L (ref 135–145)
Total Bilirubin: 1.6 mg/dL — ABNORMAL HIGH (ref 0.3–1.2)
Total Bilirubin: 2.1 mg/dL — ABNORMAL HIGH (ref 0.3–1.2)
Total Protein: 7.4 g/dL (ref 6.5–8.1)
Total Protein: 8.1 g/dL (ref 6.5–8.1)

## 2021-07-11 LAB — CBC
HCT: 21.9 % — ABNORMAL LOW (ref 39.0–52.0)
Hemoglobin: 7.3 g/dL — ABNORMAL LOW (ref 13.0–17.0)
MCH: 39.2 pg — ABNORMAL HIGH (ref 26.0–34.0)
MCHC: 33.3 g/dL (ref 30.0–36.0)
MCV: 117.7 fL — ABNORMAL HIGH (ref 80.0–100.0)
Platelets: 178 10*3/uL (ref 150–400)
RBC: 1.86 MIL/uL — ABNORMAL LOW (ref 4.22–5.81)
RDW: 23.2 % — ABNORMAL HIGH (ref 11.5–15.5)
WBC: 4.8 10*3/uL (ref 4.0–10.5)
nRBC: 1.4 % — ABNORMAL HIGH (ref 0.0–0.2)

## 2021-07-11 LAB — GLUCOSE, CAPILLARY
Glucose-Capillary: 78 mg/dL (ref 70–99)
Glucose-Capillary: 157 mg/dL — ABNORMAL HIGH (ref 70–99)
Glucose-Capillary: 95 mg/dL (ref 70–99)
Glucose-Capillary: 109 mg/dL — ABNORMAL HIGH (ref 70–99)

## 2021-07-11 LAB — FOLATE: Folate: 21.9 ng/mL (ref >5.9)

## 2021-07-11 LAB — MAGNESIUM: Magnesium: 2.5 mg/dL — ABNORMAL HIGH (ref 1.7–2.4)

## 2021-07-11 LAB — VITAMIN B12: Vitamin B-12: <50 pg/mL — ABNORMAL LOW (ref 180–914)

## 2021-07-11 LAB — PROTIME-INR
INR: 1 (ref 0.8–1.2)
Prothrombin Time: 13.6 seconds (ref 11.4–15.2)

## 2021-07-11 LAB — APTT: aPTT: 35 seconds (ref 24–36)

## 2021-07-11 LAB — HIV ANTIBODY (ROUTINE TESTING W REFLEX): HIV Screen 4th Generation wRfx: NONREACTIVE

## 2021-07-11 LAB — PHOSPHORUS: Phosphorus: 4.1 mg/dL (ref 2.5–4.6)

## 2021-07-11 MED ORDER — BISACODYL 10 MG RE SUPP: 10.0000 mg | Freq: Every day | RECTAL | Status: DC | PRN

## 2021-07-11 MED ORDER — POLYETHYLENE GLYCOL 3350 17 G PO PACK
17.0000 g | PACK | Freq: Every day | ORAL | Status: DC | PRN
  Administered 2021-07-11 – 2021-07-12 (×2): 17 g via ORAL
  Filled 2021-07-11 (×2): qty 1

## 2021-07-11 NOTE — Progress Notes (Signed)
PROGRESS NOTE    Norman Peters  [id number] DOB: Jan 28, 1956 DOA: 07/10/2021 PCP: Fayrene Helper, MD   Brief Narrative:  Norman Peters is a 65 y.o. BM PMHx hypertension, hyperlipidemia, T2DM, peripheral neuropathy, COPD, anxiety who presents to the emergency department due to 1 week onset of generalized weakness, lightheadedness, left leg pain and sensation of possible stool parasites.  Patient complained of increased fatigue and unpleasant dreams which started after he started taking gabapentin prescribed by his PCP due to left leg tingling/pain sensation.  He has noticed no improvement in left leg/calf pain with radiation to the foot.  Patient continues to be concerned of possible anal parasitic infection and he has started to take over-the-counter medication from the pharmacy.  He complained of lightheadedness, but denies chest pain, shortness of breath, fever, chills, nausea or vomiting.   ED Course:  In the emergency department, was hemodynamically stable.  Work-up in the ED showed macrocytic anemia, H/H 6.9/20.2 (this was 11.8/34.2 on 03/07/2021).  BUN to creatinine 34/1.63 (baseline creatinine at 1.3-1.5), AST 53, T bili 1.7.  Influenza A, B, SARS coronavirus 2 was negative.  Urinalysis was unimpressive for UTI.  FOBT was negative. Left lower extremity venous Doppler ultrasound showed no evidence of DVT in the LLE Type and screen was done, 1 unit of blood transfusion was ordered, IV hydration was provided.  Hospitalist was asked to admit patient for further evaluation and management.     Subjective: A/O x4, patient states feels better however still gets lightheaded when he stands up.  Afebrile overnight   Assessment & Plan:  Covid vaccination; vaccinated 4/4  Principal Problem:   Symptomatic anemia Active Problems:   Essential hypertension, benign   Anxiety and depression   Mixed hyperlipidemia   Obesity (BMI 30.0-34.9)   Type 2 diabetes mellitus with stage 3a chronic kidney  disease, with long-term current use of insulin (HCC)   Chronic kidney disease   Symptomatic anemia H/H 6.9/20.2 (this was 11.8/34.2 on 03/07/2021) Hemoccult was negative Type and crossmatch was  done in the ED -9/2 transfuse 1 unit PRBC Lab Results  Component Value Date   HGB 7.3 (L) 07/11/2021   HGB 6.9 (LL) 07/10/2021   HGB 11.8 (L) 03/07/2021   HGB 12.3 (L) 10/30/2020   HGB 13.1 (L) 01/04/2020  -Orthostatic vitals q shift  Macrocytic anemia -Anemia panel pending   Questionable pinworm infection Patient complained of possible anal parasitic infection -Ova and parasites pending  CKD stage IIIA (baseline Cr 1.3-1.5) Lab Results  Component Value Date   CREATININE 1.48 (H) 07/11/2021   CREATININE 1.52 (H) 07/11/2021   CREATININE 1.63 (H) 07/10/2021   CREATININE 1.57 (H) 06/10/2021   CREATININE 1.50 (H) 03/07/2021  -Baseline -Avoid nephrotoxic medication  DM type II uncontrolled with hyperglycemia -8/10 hemoglobin A1c =11.7 -Semglee 10 units daily -Sensitive SSI  Essential hypertension -Amlodipine 10 mg daily  HLD Patient was not taking any statin at this time per med rec We shall await updated med rec  COPD (chronic bronchitis) Continue albuterol and Singulair  Obesity (30.11) -Counseled on lifestyle modification    DVT prophylaxis: SCD Code Status: Full Family Communication:  Status is: Inpatient    Dispo: The patient is from: Home              Anticipated d/c is to: Home              Anticipated d/c date is: 2 days  Patient currently is not medically stable to d/c.      Consultants:    Procedures/Significant Events:     I have personally reviewed and interpreted all radiology studies and my findings are as above.  VENTILATOR SETTINGS:    Cultures   Antimicrobials:    Devices    LINES / TUBES:      Continuous Infusions:  sodium chloride       Objective: Vitals:   07/10/21 2127 07/11/21 0100 07/11/21  0616 07/11/21 0813  BP: 109/64 114/66 125/68 113/75  Pulse: 80 82 84   Resp: 16 18    Temp: 98.2 F (36.8 C) 98.4 F (36.9 C) 98.1 F (36.7 C)   TempSrc: Oral     SpO2: 98% 98% 100%   Weight:      Height:        Intake/Output Summary (Last 24 hours) at 07/11/2021 [id number] Last data filed at 07/11/2021 0820 Gross per 24 hour  Intake 642 ml  Output --  Net 642 ml   Filed Weights   07/10/21 1423  Weight: 89.8 kg    Examination:  General: A/O x4 No acute respiratory distress Eyes: negative scleral hemorrhage, negative anisocoria, negative icterus ENT: Negative Runny nose, negative gingival bleeding, Neck:  Negative scars, masses, torticollis, lymphadenopathy, JVD Lungs: Clear to auscultation bilaterally without wheezes or crackles Cardiovascular: Regular rate and rhythm without murmur gallop or rub normal S1 and S2 Abdomen: negative abdominal pain, nondistended, positive soft, bowel sounds, no rebound, no ascites, no appreciable mass Extremities: No significant cyanosis, clubbing, or edema bilateral lower extremities Skin: Negative rashes, lesions, ulcers Psychiatric:  Negative depression, negative anxiety, negative fatigue, negative mania  Central nervous system:  Cranial nerves II through XII intact, tongue/uvula midline, all extremities muscle strength 5/5, sensation intact throughout, negative dysarthria, negative expressive aphasia, negative receptive aphasia.  .     Data Reviewed: Care during the described time interval was provided by me .  I have reviewed this patient's available data, including medical history, events of note, physical examination, and all test results as part of my evaluation.   CBC: Recent Labs  Lab 07/10/21 1554 07/11/21 0353  WBC 5.3 4.8  NEUTROABS 3.2  --   HGB 6.9* 7.3*  HCT 20.2* 21.9*  MCV 121.7* 117.7*  PLT 205 0000000   Basic Metabolic Panel: Recent Labs  Lab 07/10/21 1554 07/11/21 0353  NA 132* 138  K 5.0 4.8  CL 102 103  CO2 24  27  GLUCOSE 113* 81  BUN 34* 30*  CREATININE 1.63* 1.52*  CALCIUM 8.9 9.4  MG  --  2.5*  PHOS  --  4.1   GFR: Estimated Creatinine Clearance: 52.8 mL/min (A) (by C-G formula based on SCr of 1.52 mg/dL (H)). Liver Function Tests: Recent Labs  Lab 07/10/21 1554 07/11/21 0353  AST 53* 40  ALT 19 17  ALKPHOS 55 50  BILITOT 1.7* 1.6*  PROT 8.0 7.4  ALBUMIN 4.0 3.7   No results for input(s): LIPASE, AMYLASE in the last 168 hours. No results for input(s): AMMONIA in the last 168 hours. Coagulation Profile: Recent Labs  Lab 07/10/21 1716 07/11/21 0353  INR 1.1 1.0   Cardiac Enzymes: No results for input(s): CKTOTAL, CKMB, CKMBINDEX, TROPONINI in the last 168 hours. BNP (last 3 results) No results for input(s): PROBNP in the last 8760 hours. HbA1C: No results for input(s): HGBA1C in the last 72 hours. CBG: Recent Labs  Lab 07/10/21 1426 07/10/21 2143 07/11/21 [id number]  GLUCAP 152* 113* 78   Lipid Profile: No results for input(s): CHOL, HDL, LDLCALC, TRIG, CHOLHDL, LDLDIRECT in the last 72 hours. Thyroid Function Tests: No results for input(s): TSH, T4TOTAL, FREET4, T3FREE, THYROIDAB in the last 72 hours. Anemia Panel: Recent Labs    07/10/21 1716 07/11/21 0353  VITAMINB12 <50* <50*  FOLATE 24.0 21.9  FERRITIN 473*  --   TIBC 256  --   IRON 66  --   RETICCTPCT 1.7  --    Urine analysis:    Component Value Date/Time   COLORURINE YELLOW 07/10/2021 1532   APPEARANCEUR CLEAR 07/10/2021 1532   LABSPEC 1.020 07/10/2021 1532   PHURINE 5.5 07/10/2021 1532   GLUCOSEU NEGATIVE 07/10/2021 1532   HGBUR TRACE (A) 07/10/2021 1532   BILIRUBINUR NEGATIVE 07/10/2021 1532   KETONESUR NEGATIVE 07/10/2021 1532   PROTEINUR NEGATIVE 07/10/2021 1532   NITRITE NEGATIVE 07/10/2021 1532   LEUKOCYTESUR NEGATIVE 07/10/2021 1532   Sepsis Labs: '@LABRCNTIP'$ (procalcitonin:4,lacticidven:4)  ) Recent Results (from the past 240 hour(s))  Resp Panel by RT-PCR (Flu A&B, Covid)  Nasopharyngeal Swab     Status: None   Collection Time: 07/10/21  3:34 PM   Specimen: Nasopharyngeal Swab; Nasopharyngeal(NP) swabs in vial transport medium  Result Value Ref Range Status   SARS Coronavirus 2 by RT PCR NEGATIVE NEGATIVE Final    Comment: (NOTE) SARS-CoV-2 target nucleic acids are NOT DETECTED.  The SARS-CoV-2 RNA is generally detectable in upper respiratory specimens during the acute phase of infection. The lowest concentration of SARS-CoV-2 viral copies this assay can detect is 138 copies/mL. A negative result does not preclude SARS-Cov-2 infection and should not be used as the sole basis for treatment or other patient management decisions. A negative result may occur with  improper specimen collection/handling, submission of specimen other than nasopharyngeal swab, presence of viral mutation(s) within the areas targeted by this assay, and inadequate number of viral copies(<138 copies/mL). A negative result must be combined with clinical observations, patient history, and epidemiological information. The expected result is Negative.  Fact Sheet for Patients:  EntrepreneurPulse.com.au  Fact Sheet for Healthcare Providers:  IncredibleEmployment.be  This test is no t yet approved or cleared by the [place] FDA and  has been authorized for detection and/or diagnosis of SARS-CoV-2 by FDA under an Emergency Use Authorization (EUA). This EUA will remain  in effect (meaning this test can be used) for the duration of the COVID-19 declaration under Section 564(b)(1) of the Act, 21 U.S.C.section 360bbb-3(b)(1), unless the authorization is terminated  or revoked sooner.       Influenza A by PCR NEGATIVE NEGATIVE Final   Influenza B by PCR NEGATIVE NEGATIVE Final    Comment: (NOTE) The Xpert Xpress SARS-CoV-2/FLU/RSV plus assay is intended as an aid in the diagnosis of influenza from Nasopharyngeal swab specimens and should not be  used as a sole basis for treatment. Nasal washings and aspirates are unacceptable for Xpert Xpress SARS-CoV-2/FLU/RSV testing.  Fact Sheet for Patients: EntrepreneurPulse.com.au  Fact Sheet for Healthcare Providers: IncredibleEmployment.be  This test is not yet approved or cleared by the [place] FDA and has been authorized for detection and/or diagnosis of SARS-CoV-2 by FDA under an Emergency Use Authorization (EUA). This EUA will remain in effect (meaning this test can be used) for the duration of the COVID-19 declaration under Section 564(b)(1) of the Act, 21 U.S.C. section 360bbb-3(b)(1), unless the authorization is terminated or revoked.  Performed at [facility], [address], [place], [place] [address]  Radiology Studies: US Venous Img Lower  Left (DVT Study)  Result Date: 07/10/2021 CLINICAL DATA:  leg pain EXAM: LEFT LOWER EXTREMITY VENOUS DOPPLER ULTRASOUND TECHNIQUE: Gray-scale sonography with compression, as well as color and duplex ultrasound, were performed to evaluate the deep venous system(s) from the level of the common femoral vein through the popliteal and proximal calf veins. COMPARISON:  None. FINDINGS: VENOUS Normal compressibility of the common femoral, superficial femoral, and popliteal veins, as well as the visualized calf veins. Visualized portions of profunda femoral vein and great saphenous vein unremarkable. No filling defects to suggest DVT on grayscale or color Doppler imaging. Doppler waveforms show normal direction of venous flow, normal respiratory plasticity and response to augmentation. Limited views of the contralateral common femoral vein are unremarkable. IMPRESSION: No evidence of DVT in the left lower extremity. Electronically Signed   By: Margaretha Sheffield M.D.   On: 07/10/2021 17:21        Scheduled Meds:  amLODipine  10 mg Oral Daily   gabapentin  100 mg Oral TID   insulin aspart  0-5  Units Subcutaneous QHS   insulin aspart  0-9 Units Subcutaneous TID WC   insulin glargine-yfgn  10 Units Subcutaneous QHS   montelukast  10 mg Oral QHS   sodium chloride flush  3 mL Intravenous Q12H   Continuous Infusions:  sodium chloride       LOS: 0 days   The patient is critically ill with multiple organ systems failure and requires high complexity decision making for assessment and support, frequent evaluation and titration of therapies, application of advanced monitoring technologies and extensive interpretation of multiple databases. Critical Care Time devoted to patient care services described in this note  Time spent: 40 minutes     Tai Skelly, Geraldo Docker, MD [facility]   If 7PM-7AM, please contact night-coverage 07/11/2021, 8:28 AM

## 2021-07-12 LAB — IRON AND TIBC
Iron: 93 ug/dL (ref 45–182)
Saturation Ratios: 38 % (ref 17.9–39.5)
TIBC: 242 ug/dL — ABNORMAL LOW (ref 250–450)
UIBC: 149 ug/dL

## 2021-07-12 LAB — COMPREHENSIVE METABOLIC PANEL
ALT: 18 U/L (ref 0–44)
AST: 60 U/L — ABNORMAL HIGH (ref 15–41)
Albumin: 3.8 g/dL (ref 3.5–5.0)
Alkaline Phosphatase: 51 U/L (ref 38–126)
Anion gap: 5 (ref 5–15)
BUN: 32 mg/dL — ABNORMAL HIGH (ref 8–23)
CO2: 27 mmol/L (ref 22–32)
Calcium: 9.2 mg/dL (ref 8.9–10.3)
Chloride: 103 mmol/L (ref 98–111)
Creatinine, Ser: 1.56 mg/dL — ABNORMAL HIGH (ref 0.61–1.24)
GFR, Estimated: 49 mL/min — ABNORMAL LOW (ref >60)
Glucose, Bld: 104 mg/dL — ABNORMAL HIGH (ref 70–99)
Potassium: 4.9 mmol/L (ref 3.5–5.1)
Sodium: 135 mmol/L (ref 135–145)
Total Bilirubin: 1.7 mg/dL — ABNORMAL HIGH (ref 0.3–1.2)
Total Protein: 7.4 g/dL (ref 6.5–8.1)

## 2021-07-12 LAB — FOLATE: Folate: 18.6 ng/mL (ref >5.9)

## 2021-07-12 LAB — PHOSPHORUS: Phosphorus: 4.1 mg/dL (ref 2.5–4.6)

## 2021-07-12 LAB — CBC
HCT: 22.4 % — ABNORMAL LOW (ref 39.0–52.0)
Hemoglobin: 7.4 g/dL — ABNORMAL LOW (ref 13.0–17.0)
MCH: 38.3 pg — ABNORMAL HIGH (ref 26.0–34.0)
MCHC: 33 g/dL (ref 30.0–36.0)
MCV: 116.1 fL — ABNORMAL HIGH (ref 80.0–100.0)
Platelets: 170 10*3/uL (ref 150–400)
RBC: 1.93 MIL/uL — ABNORMAL LOW (ref 4.22–5.81)
RDW: 22.6 % — ABNORMAL HIGH (ref 11.5–15.5)
WBC: 4.9 10*3/uL (ref 4.0–10.5)
nRBC: 1.6 % — ABNORMAL HIGH (ref 0.0–0.2)

## 2021-07-12 LAB — MAGNESIUM: Magnesium: 2.3 mg/dL (ref 1.7–2.4)

## 2021-07-12 LAB — RETICULOCYTES
Immature Retic Fract: 17.5 % — ABNORMAL HIGH (ref 2.3–15.9)
RBC.: 1.91 MIL/uL — ABNORMAL LOW (ref 4.22–5.81)
Retic Count, Absolute: 27.1 10*3/uL (ref 19.0–186.0)
Retic Ct Pct: 1.4 % (ref 0.4–3.1)

## 2021-07-12 LAB — GLUCOSE, CAPILLARY
Glucose-Capillary: 101 mg/dL — ABNORMAL HIGH (ref 70–99)
Glucose-Capillary: 147 mg/dL — ABNORMAL HIGH (ref 70–99)

## 2021-07-12 LAB — OVA + PARASITE EXAM

## 2021-07-12 LAB — FERRITIN: Ferritin: 467 ng/mL — ABNORMAL HIGH (ref 24–336)

## 2021-07-12 LAB — VITAMIN B12: Vitamin B-12: <50 pg/mL — ABNORMAL LOW (ref 180–914)

## 2021-07-12 MED ORDER — NOVOLOG FLEXPEN 100 UNIT/ML ~~LOC~~ SOPN: 5.0000 [IU] | PEN_INJECTOR | Freq: Three times a day (TID) | SUBCUTANEOUS | 0 refills | Status: DC

## 2021-07-12 MED ORDER — MONTELUKAST SODIUM 10 MG PO TABS: 10.0000 mg | ORAL_TABLET | Freq: Every day | ORAL | 0 refills | Status: DC

## 2021-07-12 NOTE — Progress Notes (Signed)
Patient states understanding of discharge instructions.  

## 2021-07-12 NOTE — Discharge Summary (Signed)
Physician Discharge Summary  Norman Peters [id number] DOB: 05-30-1956 DOA: 07/10/2021  PCP: Fayrene Helper, MD  Admit date: 07/10/2021 Discharge date: 07/12/2021  Time spent: 35 minutes  Recommendations for Outpatient Follow-up:    Covid vaccination; vaccinated 4/4  Symptomatic anemia H/H 6.9/20.2 (this was 11.8/34.2 on 03/07/2021) Hemoccult was negative Type and crossmatch was  done in the ED -9/2 transfuse 1 unit PRBC Lab Results  Component Value Date   HGB 7.4 (L) 07/12/2021   HGB 7.3 (L) 07/11/2021   HGB 6.9 (LL) 07/10/2021   HGB 11.8 (L) 03/07/2021   HGB 12.3 (L) 10/30/2020  -Stable  Macrocytic anemia -Anemia panel pending    Questionable pinworm infection Patient complained of possible anal parasitic infection -Ova and parasites pending  CKD stage IIIA (baseline Cr 1.3-1.5) Lab Results  Component Value Date   CREATININE 1.56 (H) 07/12/2021   CREATININE 1.48 (H) 07/11/2021   CREATININE 1.52 (H) 07/11/2021   CREATININE 1.63 (H) 07/10/2021   CREATININE 1.57 (H) 06/10/2021  -Close to baseline -Counseled patient to avoid all nephrotoxic agents,  DM type II uncontrolled with hyperglycemia -8/10 hemoglobin A1c =11.7 - Lantus 45 units daily (home dose) - Given patient's poor renal function DC his home metformin - Start NovoLog 5 units qac - Counseled patient to follow-up with his PCP in 1 to 2 weeks given that his diabetes is very poorly controlled diabetes and need to titrate medication.  Essential hypertension -Amlodipine 10 mg daily  HLD Patient was not taking any statin at this time per med rec We shall await updated med rec  COPD (chronic bronchitis) Continue albuterol and Singulair  Obesity (30.11) -Counseled on lifestyle modification     Discharge Diagnoses:  Principal Problem:   Symptomatic anemia Active Problems:   Essential hypertension, benign   Anxiety and depression   Mixed hyperlipidemia   Obesity (BMI 30.0-34.9)   Type 2  diabetes mellitus with stage 3a chronic kidney disease, with long-term current use of insulin ([place])   Chronic kidney disease   Discharge Condition: Stable  Diet recommendation: Heart healthy/carb modified  Filed Weights   07/10/21 1423  Weight: 89.8 kg    History of present illness:  Norman Peters is a 65 y.o. BM PMHx hypertension, hyperlipidemia, T2DM, peripheral neuropathy, COPD, anxiety who presents to the emergency department due to 1 week onset of generalized weakness, lightheadedness, left leg pain and sensation of possible stool parasites.  Patient complained of increased fatigue and unpleasant dreams which started after he started taking gabapentin prescribed by his PCP due to left leg tingling/pain sensation.  He has noticed no improvement in left leg/calf pain with radiation to the foot.  Patient continues to be concerned of possible anal parasitic infection and he has started to take over-the-counter medication from the pharmacy.  He complained of lightheadedness, but denies chest pain, shortness of breath, fever, chills, nausea or vomiting.   ED Course:  In the emergency department, was hemodynamically stable.  Work-up in the ED showed macrocytic anemia, H/H 6.9/20.2 (this was 11.8/34.2 on 03/07/2021).  BUN to creatinine 34/1.63 (baseline creatinine at 1.3-1.5), AST 53, T bili 1.7.  Influenza A, B, SARS coronavirus 2 was negative.  Urinalysis was unimpressive for UTI.  FOBT was negative. Left lower extremity venous Doppler ultrasound showed no evidence of DVT in the LLE Type and screen was done, 1 unit of blood transfusion was ordered, IV hydration was provided.  Hospitalist was asked to admit patient for further evaluation and management.  Hospital Course:  See above    Cultures  9/2 SARS coronavirus negative but 9/2 influenza A/B 9/2 ova and parasite pending 9/3 HIV negative      Discharge Exam: Vitals:   07/11/21 1336 07/11/21 2110 07/12/21 0614 07/12/21 1333  BP:  (!) 112/59  116/69 116/65  Pulse: 83  76 81  Resp: 16   17  Temp: 99 F (37.2 C) 99.2 F (37.3 C) 98.6 F (37 C) 98.6 F (37 C)  TempSrc: Oral Oral  Oral  SpO2: 95% 99% 97% 98%  Weight:      Height:        General: A/O x4 No acute respiratory distress Eyes: negative scleral hemorrhage, negative anisocoria, negative icterus ENT: Negative Runny nose, negative gingival bleeding, Neck:  Negative scars, masses, torticollis, lymphadenopathy, JVD Lungs: Clear to auscultation bilaterally without wheezes or crackles Cardiovascular: Regular rate and rhythm without murmur gallop or rub normal S1 and S2   Discharge Instructions   Allergies as of 07/12/2021       Reactions   Ibuprofen Rash   Lisinopril Cough   Metformin And Related Cough   Other Other (See Comments)   Strawberry [berry]         Medication List     STOP taking these medications    busPIRone 5 MG tablet Commonly known as: BUSPAR   FLUoxetine 40 MG capsule Commonly known as: PROZAC   fluticasone 50 MCG/ACT nasal spray Commonly known as: FLONASE   metFORMIN 500 MG tablet Commonly known as: GLUCOPHAGE   spironolactone 50 MG tablet Commonly known as: ALDACTONE   tadalafil 10 MG tablet Commonly known as: CIALIS   Vitamin D3 10 MCG (400 UNIT) Caps       TAKE these medications    albuterol 108 (90 Base) MCG/ACT inhaler Commonly known as: VENTOLIN HFA Inhale 2 puffs into the lungs daily as needed for wheezing or shortness of breath.   amLODipine 10 MG tablet Commonly known as: NORVASC TAKE 1 TABLET EVERY DAY   aspirin EC 81 MG tablet Take 81 mg by mouth daily.   chlorhexidine 0.12 % solution Commonly known as: PERIDEX 15 mLs 2 (two) times daily.   gabapentin 100 MG capsule Commonly known as: NEURONTIN Take 1 capsule (100 mg total) by mouth 3 (three) times daily.   Insulin Pen Needle 32G X 4 MM Misc Use daily to inject insulin DX e11.65   Lantus SoloStar 100 UNIT/ML Solostar Pen Generic  drug: insulin glargine INJECT 45 UNITS UNDER THE SKIN EVERY MORNING (DOSE INCREASE) What changed: See the new instructions.   montelukast 10 MG tablet Commonly known as: SINGULAIR Take 1 tablet (10 mg total) by mouth daily. What changed: when to take this   NovoLOG FlexPen 100 UNIT/ML FlexPen Generic drug: insulin aspart Inject 5 Units into the skin 3 (three) times daily with meals.   rosuvastatin 5 MG tablet Commonly known as: CRESTOR TAKE 1 TABLET EVERY MONDAY, WEDNESDAY AND FRIDAY (DISCONTINUE PRAVASTATIN)   sodium chloride 0.65 % Soln nasal spray Commonly known as: OCEAN Place 1 spray into both nostrils as needed for congestion.   True Metrix Blood Glucose Test test strip Generic drug: glucose blood TEST ONE TIME DAILY   TRUEplus Lancets 28G Misc USE  TO  TEST ONE TIME DAILY   VICKS SINEX DAYQUIL/NYQUIL PO Take 1 tablet by mouth daily as needed.       Allergies  Allergen Reactions   Ibuprofen Rash   Lisinopril Cough   Metformin And Related Cough  Other Other (See Comments)   Strawberry [Berry]       The results of significant diagnostics from this hospitalization (including imaging, microbiology, ancillary and laboratory) are listed below for reference.    Significant Diagnostic Studies: US Venous Img Lower  Left (DVT Study)  Result Date: 07/10/2021 CLINICAL DATA:  leg pain EXAM: LEFT LOWER EXTREMITY VENOUS DOPPLER ULTRASOUND TECHNIQUE: Gray-scale sonography with compression, as well as color and duplex ultrasound, were performed to evaluate the deep venous system(s) from the level of the common femoral vein through the popliteal and proximal calf veins. COMPARISON:  None. FINDINGS: VENOUS Normal compressibility of the common femoral, superficial femoral, and popliteal veins, as well as the visualized calf veins. Visualized portions of profunda femoral vein and great saphenous vein unremarkable. No filling defects to suggest DVT on grayscale or color Doppler  imaging. Doppler waveforms show normal direction of venous flow, normal respiratory plasticity and response to augmentation. Limited views of the contralateral common femoral vein are unremarkable. IMPRESSION: No evidence of DVT in the left lower extremity. Electronically Signed   By: Margaretha Sheffield M.D.   On: 07/10/2021 17:21    Microbiology: Recent Results (from the past 240 hour(s))  Resp Panel by RT-PCR (Flu A&B, Covid) Nasopharyngeal Swab     Status: None   Collection Time: 07/10/21  3:34 PM   Specimen: Nasopharyngeal Swab; Nasopharyngeal(NP) swabs in vial transport medium  Result Value Ref Range Status   SARS Coronavirus 2 by RT PCR NEGATIVE NEGATIVE Final    Comment: (NOTE) SARS-CoV-2 target nucleic acids are NOT DETECTED.  The SARS-CoV-2 RNA is generally detectable in upper respiratory specimens during the acute phase of infection. The lowest concentration of SARS-CoV-2 viral copies this assay can detect is 138 copies/mL. A negative result does not preclude SARS-Cov-2 infection and should not be used as the sole basis for treatment or other patient management decisions. A negative result may occur with  improper specimen collection/handling, submission of specimen other than nasopharyngeal swab, presence of viral mutation(s) within the areas targeted by this assay, and inadequate number of viral copies(<138 copies/mL). A negative result must be combined with clinical observations, patient history, and epidemiological information. The expected result is Negative.  Fact Sheet for Patients:  EntrepreneurPulse.com.au  Fact Sheet for Healthcare Providers:  IncredibleEmployment.be  This test is no t yet approved or cleared by the [place] FDA and  has been authorized for detection and/or diagnosis of SARS-CoV-2 by FDA under an Emergency Use Authorization (EUA). This EUA will remain  in effect (meaning this test can be used) for the  duration of the COVID-19 declaration under Section 564(b)(1) of the Act, 21 U.S.C.section 360bbb-3(b)(1), unless the authorization is terminated  or revoked sooner.       Influenza A by PCR NEGATIVE NEGATIVE Final   Influenza B by PCR NEGATIVE NEGATIVE Final    Comment: (NOTE) The Xpert Xpress SARS-CoV-2/FLU/RSV plus assay is intended as an aid in the diagnosis of influenza from Nasopharyngeal swab specimens and should not be used as a sole basis for treatment. Nasal washings and aspirates are unacceptable for Xpert Xpress SARS-CoV-2/FLU/RSV testing.  Fact Sheet for Patients: EntrepreneurPulse.com.au  Fact Sheet for Healthcare Providers: IncredibleEmployment.be  This test is not yet approved or cleared by the [place] FDA and has been authorized for detection and/or diagnosis of SARS-CoV-2 by FDA under an Emergency Use Authorization (EUA). This EUA will remain in effect (meaning this test can be used) for the duration of the COVID-19 declaration under  Section 564(b)(1) of the Act, 21 U.S.C. section 360bbb-3(b)(1), unless the authorization is terminated or revoked.  Performed at [facility], [address], [place], [place] [address]      Labs: Basic Metabolic Panel: Recent Labs  Lab 07/10/21 1554 07/11/21 0353 07/11/21 0840 07/12/21 0546  NA 132* 138 137 135  K 5.0 4.8 4.8 4.9  CL 102 103 102 103  CO2 '24 27 27 27  '$ GLUCOSE 113* 81 96 104*  BUN 34* 30* 30* 32*  CREATININE 1.63* 1.52* 1.48* 1.56*  CALCIUM 8.9 9.4 9.4 9.2  MG  --  2.5*  --  2.3  PHOS  --  4.1  --  4.1   Liver Function Tests: Recent Labs  Lab 07/10/21 1554 07/11/21 0353 07/11/21 0840 07/12/21 0546  AST 53* 40 44* 60*  ALT '19 17 19 18  '$ ALKPHOS 55 50 54 51  BILITOT 1.7* 1.6* 2.1* 1.7*  PROT 8.0 7.4 8.1 7.4  ALBUMIN 4.0 3.7 4.1 3.8   No results for input(s): LIPASE, AMYLASE in the last 168 hours. No results for input(s): AMMONIA in the last 168  hours. CBC: Recent Labs  Lab 07/10/21 1554 07/11/21 0353 07/12/21 0546  WBC 5.3 4.8 4.9  NEUTROABS 3.2  --   --   HGB 6.9* 7.3* 7.4*  HCT 20.2* 21.9* 22.4*  MCV 121.7* 117.7* 116.1*  PLT 205 178 170   Cardiac Enzymes: No results for input(s): CKTOTAL, CKMB, CKMBINDEX, TROPONINI in the last 168 hours. BNP: BNP (last 3 results) No results for input(s): BNP in the last 8760 hours.  ProBNP (last 3 results) No results for input(s): PROBNP in the last 8760 hours.  CBG: Recent Labs  Lab 07/11/21 1122 07/11/21 1604 07/11/21 2114 07/12/21 0719 07/12/21 1109  GLUCAP 157* 95 109* 101* 147*       Signed:  Dia Crawford, MD [facility]

## 2021-07-14 ENCOUNTER — Telehealth: Payer: Self-pay

## 2021-07-14 LAB — PATHOLOGIST SMEAR REVIEW

## 2021-07-14 NOTE — Telephone Encounter (Signed)
Transition Care Management Follow-up Telephone Call Date of discharge and from where: 07/12/21 [place]   How have you been since you were released from the hospital? About the same  Any questions or concerns? No  Items Reviewed: Did the pt receive and understand the discharge instructions provided? Yes  Medications obtained and verified? Yes  Other? No  Any new allergies since your discharge? No  Dietary orders reviewed? Yes low carb Do you have support at home? Yes   Home Care and Equipment/Supplies: Were home health services ordered? no If so, what is the name of the agency? N/A  Has the agency set up a time to come to the patient's home? not applicable Were any new equipment or medical supplies ordered?  No What is the name of the medical supply agency? N/A Were you able to get the supplies/equipment? not applicable Do you have any questions related to the use of the equipment or supplies? No  Functional Questionnaire: (I = Independent and D = Dependent) ADLs: I  Bathing/Dressing- I  Meal Prep- I  Eating- I   Maintaining continence- I   Transferring/Ambulation- I   Managing Meds- I   Follow up appointments reviewed:  PCP Hospital f/u appt confirmed? Yes Scheduled to see [facility] on 07/17/21 @ 9:40. [place] [facility] f/u appt confirmed? No  Scheduled to see na on na @ na. Are transportation arrangements needed? No  If their condition worsens, is the pt aware to call PCP or go to the Emergency Dept.? Yes Was the patient provided with contact information for the PCP's office or ED? Yes Was to pt encouraged to call back with questions or concerns? Yes

## 2021-07-17 ENCOUNTER — Ambulatory Visit (INDEPENDENT_AMBULATORY_CARE_PROVIDER_SITE_OTHER): Payer: Medicare HMO | Admitting: Family Medicine

## 2021-07-17 ENCOUNTER — Encounter: Payer: Self-pay | Admitting: Family Medicine

## 2021-07-17 ENCOUNTER — Other Ambulatory Visit: Payer: Self-pay

## 2021-07-17 VITALS — BP 121/62 | HR 96 | Resp 16 | Ht 68.0 in | Wt 202.0 lb

## 2021-07-17 DIAGNOSIS — E538 Deficiency of other specified B group vitamins: Secondary | ICD-10-CM

## 2021-07-17 DIAGNOSIS — E1165 Type 2 diabetes mellitus with hyperglycemia: Secondary | ICD-10-CM

## 2021-07-17 DIAGNOSIS — Z09 Encounter for follow-up examination after completed treatment for conditions other than malignant neoplasm: Secondary | ICD-10-CM | POA: Diagnosis not present

## 2021-07-17 DIAGNOSIS — E559 Vitamin D deficiency, unspecified: Secondary | ICD-10-CM | POA: Diagnosis not present

## 2021-07-17 DIAGNOSIS — Z794 Long term (current) use of insulin: Secondary | ICD-10-CM

## 2021-07-17 DIAGNOSIS — D519 Vitamin B12 deficiency anemia, unspecified: Secondary | ICD-10-CM | POA: Diagnosis not present

## 2021-07-17 DIAGNOSIS — Z23 Encounter for immunization: Secondary | ICD-10-CM | POA: Diagnosis not present

## 2021-07-17 DIAGNOSIS — Z125 Encounter for screening for malignant neoplasm of prostate: Secondary | ICD-10-CM | POA: Diagnosis not present

## 2021-07-17 DIAGNOSIS — L290 Pruritus ani: Secondary | ICD-10-CM | POA: Diagnosis not present

## 2021-07-17 DIAGNOSIS — Z7689 Persons encountering health services in other specified circumstances: Secondary | ICD-10-CM

## 2021-07-17 DIAGNOSIS — I10 Essential (primary) hypertension: Secondary | ICD-10-CM | POA: Diagnosis not present

## 2021-07-17 DIAGNOSIS — E1140 Type 2 diabetes mellitus with diabetic neuropathy, unspecified: Secondary | ICD-10-CM

## 2021-07-17 DIAGNOSIS — E782 Mixed hyperlipidemia: Secondary | ICD-10-CM

## 2021-07-17 DIAGNOSIS — B80 Enterobiasis: Secondary | ICD-10-CM

## 2021-07-17 MED ORDER — CYANOCOBALAMIN 1000 MCG/ML IJ SOLN
1000.0000 ug | Freq: Once | INTRAMUSCULAR | Status: AC
  Administered 2021-07-17: 1000 ug via INTRAMUSCULAR

## 2021-07-17 MED ORDER — ALBENDAZOLE 200 MG PO TABS: ORAL_TABLET | ORAL | 0 refills | Status: DC

## 2021-07-17 MED ORDER — MEBENDAZOLE 100 MG PO CHEW: CHEWABLE_TABLET | ORAL | 0 refills | Status: DC

## 2021-07-17 NOTE — Patient Instructions (Addendum)
F/U in office in 4 week, B12 , 1cc IM at visit also  Flu and Pneumonia 20 vaccines today  Vit B12 1cc IM in  the office today and every 4 weeks for the next several months, you are deficient in Vit B12   You are treated for presumed worm infestation, please send the photo to your  my chart account  You are being.  Presumptively treated for pinworm infestation 2 tablets are prescribed and are at your pharmacy we will contact the pharmacy to make sure that this is available, if not will change to available medication.  Please resubmit stool specimen As soon as possible you will be given a container.  Labs today CBC Cmp and EGFR PSA.and vit D  You are being referred to GI because of severe anemia of unknown cause.    If you do test positive for worms your entire household will need to be treated.

## 2021-07-18 ENCOUNTER — Other Ambulatory Visit: Payer: Self-pay

## 2021-07-18 ENCOUNTER — Emergency Department (HOSPITAL_COMMUNITY): Payer: Medicare HMO

## 2021-07-18 ENCOUNTER — Emergency Department (HOSPITAL_COMMUNITY)
Admission: EM | Admit: 2021-07-18 | Discharge: 2021-07-18 | Disposition: A | Discharge status: Home / Self Care | Payer: Medicare HMO | Attending: Emergency Medicine | Admitting: Emergency Medicine

## 2021-07-18 ENCOUNTER — Encounter (HOSPITAL_COMMUNITY): Payer: Self-pay

## 2021-07-18 DIAGNOSIS — Z79899 Other long term (current) drug therapy: Secondary | ICD-10-CM | POA: Insufficient documentation

## 2021-07-18 DIAGNOSIS — R2689 Other abnormalities of gait and mobility: Secondary | ICD-10-CM | POA: Diagnosis not present

## 2021-07-18 DIAGNOSIS — N179 Acute kidney failure, unspecified: Secondary | ICD-10-CM | POA: Diagnosis not present

## 2021-07-18 DIAGNOSIS — I129 Hypertensive chronic kidney disease with stage 1 through stage 4 chronic kidney disease, or unspecified chronic kidney disease: Secondary | ICD-10-CM | POA: Insufficient documentation

## 2021-07-18 DIAGNOSIS — E1122 Type 2 diabetes mellitus with diabetic chronic kidney disease: Secondary | ICD-10-CM | POA: Diagnosis not present

## 2021-07-18 DIAGNOSIS — Z7982 Long term (current) use of aspirin: Secondary | ICD-10-CM | POA: Insufficient documentation

## 2021-07-18 DIAGNOSIS — D649 Anemia, unspecified: Secondary | ICD-10-CM | POA: Diagnosis not present

## 2021-07-18 DIAGNOSIS — Z794 Long term (current) use of insulin: Secondary | ICD-10-CM | POA: Insufficient documentation

## 2021-07-18 DIAGNOSIS — R5383 Other fatigue: Secondary | ICD-10-CM | POA: Diagnosis not present

## 2021-07-18 DIAGNOSIS — R531 Weakness: Secondary | ICD-10-CM | POA: Diagnosis present

## 2021-07-18 DIAGNOSIS — N1831 Chronic kidney disease, stage 3a: Secondary | ICD-10-CM | POA: Diagnosis not present

## 2021-07-18 DIAGNOSIS — E875 Hyperkalemia: Secondary | ICD-10-CM | POA: Diagnosis not present

## 2021-07-18 DIAGNOSIS — R9431 Abnormal electrocardiogram [ECG] [EKG]: Secondary | ICD-10-CM | POA: Diagnosis not present

## 2021-07-18 DIAGNOSIS — Z87891 Personal history of nicotine dependence: Secondary | ICD-10-CM | POA: Diagnosis not present

## 2021-07-18 LAB — CBC WITH DIFFERENTIAL/PLATELET
Basophils Absolute: 0 10*3/uL (ref 0.0–0.2)
Basophils Absolute: 0 10*3/uL (ref 0.0–0.1)
Basophils Relative: 0 %
Basos: 0 %
EOS (ABSOLUTE): 0.5 10*3/uL — ABNORMAL HIGH (ref 0.0–0.4)
Eos: 9 %
Eosinophils Absolute: 0.3 10*3/uL (ref 0.0–0.5)
Eosinophils Relative: 5 %
HCT: 21.4 % — ABNORMAL LOW (ref 39.0–52.0)
Hematocrit: 20.6 % — ABNORMAL LOW (ref 37.5–51.0)
Hemoglobin: 7.4 g/dL — ABNORMAL LOW (ref 13.0–17.7)
Hemoglobin: 7.2 g/dL — ABNORMAL LOW (ref 13.0–17.0)
Immature Grans (Abs): 0 10*3/uL (ref 0.0–0.1)
Immature Granulocytes: 1 %
Lymphocytes Absolute: 3.1 10*3/uL (ref 0.7–3.1)
Lymphocytes Relative: 43 %
Lymphs Abs: 2.8 10*3/uL (ref 0.7–4.0)
Lymphs: 55 %
MCH: 38.3 pg — ABNORMAL HIGH (ref 26.6–33.0)
MCH: 40 pg — ABNORMAL HIGH (ref 26.0–34.0)
MCHC: 35.9 g/dL — ABNORMAL HIGH (ref 31.5–35.7)
MCHC: 33.6 g/dL (ref 30.0–36.0)
MCV: 107 fL — ABNORMAL HIGH (ref 79–97)
MCV: 118.9 fL — ABNORMAL HIGH (ref 80.0–100.0)
Monocytes Absolute: 0.1 10*3/uL (ref 0.1–0.9)
Monocytes Absolute: 0.1 10*3/uL (ref 0.1–1.0)
Monocytes Relative: 1 %
Monocytes: 1 %
NRBC: 1 % — ABNORMAL HIGH (ref 0–0)
Neutro Abs: 3.3 10*3/uL (ref 1.7–7.7)
Neutrophils Absolute: 1.9 10*3/uL (ref 1.4–7.0)
Neutrophils Relative %: 51 %
Neutrophils: 34 %
Platelets: 151 10*3/uL (ref 150–450)
Platelets: 155 10*3/uL (ref 150–400)
RBC: 1.93 x10E6/uL — CL (ref 4.14–5.80)
RBC: 1.8 MIL/uL — ABNORMAL LOW (ref 4.22–5.81)
RDW: 20.7 % — ABNORMAL HIGH (ref 11.6–15.4)
RDW: 23.4 % — ABNORMAL HIGH (ref 11.5–15.5)
WBC: 5.6 10*3/uL (ref 3.4–10.8)
WBC: 6.5 10*3/uL (ref 4.0–10.5)
nRBC: 1.4 % — ABNORMAL HIGH (ref 0.0–0.2)

## 2021-07-18 LAB — CMP14+EGFR
ALT: 23 IU/L (ref 0–44)
AST: 60 IU/L — ABNORMAL HIGH (ref 0–40)
Albumin/Globulin Ratio: 1.3 (ref 1.2–2.2)
Albumin: 4.3 g/dL (ref 3.8–4.8)
Alkaline Phosphatase: 63 IU/L (ref 44–121)
BUN/Creatinine Ratio: 19 (ref 10–24)
BUN: 38 mg/dL — ABNORMAL HIGH (ref 8–27)
Bilirubin Total: 1.4 mg/dL — ABNORMAL HIGH (ref 0.0–1.2)
CO2: 20 mmol/L (ref 20–29)
Calcium: 9.4 mg/dL (ref 8.6–10.2)
Chloride: 101 mmol/L (ref 96–106)
Creatinine, Ser: 1.98 mg/dL — ABNORMAL HIGH (ref 0.76–1.27)
Globulin, Total: 3.4 g/dL (ref 1.5–4.5)
Glucose: 87 mg/dL (ref 65–99)
Potassium: 6.1 mmol/L — ABNORMAL HIGH (ref 3.5–5.2)
Sodium: 136 mmol/L (ref 134–144)
Total Protein: 7.7 g/dL (ref 6.0–8.5)
eGFR: 37 mL/min/{1.73_m2} — ABNORMAL LOW (ref >59)

## 2021-07-18 LAB — BASIC METABOLIC PANEL
Anion gap: 6 (ref 5–15)
BUN: 37 mg/dL — ABNORMAL HIGH (ref 8–23)
CO2: 25 mmol/L (ref 22–32)
Calcium: 8.7 mg/dL — ABNORMAL LOW (ref 8.9–10.3)
Chloride: 104 mmol/L (ref 98–111)
Creatinine, Ser: 2.13 mg/dL — ABNORMAL HIGH (ref 0.61–1.24)
GFR, Estimated: 34 mL/min — ABNORMAL LOW (ref >60)
Glucose, Bld: 174 mg/dL — ABNORMAL HIGH (ref 70–99)
Potassium: 5.2 mmol/L — ABNORMAL HIGH (ref 3.5–5.1)
Sodium: 135 mmol/L (ref 135–145)

## 2021-07-18 LAB — RETICULOCYTES
Immature Retic Fract: 25.4 % — ABNORMAL HIGH (ref 2.3–15.9)
RBC.: 1.81 MIL/uL — ABNORMAL LOW (ref 4.22–5.81)
Retic Count, Absolute: 32.6 10*3/uL (ref 19.0–186.0)
Retic Ct Pct: 1.8 % (ref 0.4–3.1)

## 2021-07-18 LAB — VITAMIN D 25 HYDROXY (VIT D DEFICIENCY, FRACTURES): Vit D, 25-Hydroxy: 36.9 ng/mL (ref 30.0–100.0)

## 2021-07-18 LAB — PSA: Prostate Specific Ag, Serum: 1.9 ng/mL (ref 0.0–4.0)

## 2021-07-18 LAB — PREPARE RBC (CROSSMATCH)

## 2021-07-18 LAB — LACTATE DEHYDROGENASE: LDH: 2271 U/L — ABNORMAL HIGH (ref 98–192)

## 2021-07-18 MED ORDER — SODIUM CHLORIDE 0.9 % IV SOLN: 10.0000 mL/h | Freq: Once | INTRAVENOUS | Status: DC

## 2021-07-18 MED ORDER — SODIUM CHLORIDE 0.9 % IV BOLUS
500.0000 mL | Freq: Once | INTRAVENOUS | Status: AC
  Administered 2021-07-18: 500 mL via INTRAVENOUS

## 2021-07-18 NOTE — ED Triage Notes (Signed)
Pt says was admitted last week and discharged this past Sunday.  Says he feels off balance and his doctor called him and told him to come to ed because he had some abnormal labs.  Pt says he doesn't know what lab is abnormal.  Reports he had to have a blood transfusion when he was here last time.  Denies any pain.  C/o fatigue.

## 2021-07-18 NOTE — Discharge Instructions (Addendum)
Stop taking the spironolactone.

## 2021-07-18 NOTE — ED Notes (Signed)
Patient transported to CT 

## 2021-07-18 NOTE — ED Provider Notes (Signed)
[facility] EMERGENCY DEPARTMENT Provider Note   CSN: [id number] Arrival date & time: 07/18/21  1522     History Chief Complaint  Patient presents with   Abnormal Lab    Norman Peters is a 65 y.o. male.  Pt presents to the ED today with weakness.  Pt was admitted from 9/2-9/4 for symptomatic anemia h/h (6.9/20.2).  Hemoccult was negative.  Pt given 1 unit of prbcs in the hospital.  When he left, his hbg was 7.4 (on 9/4).  He followed up with his pcp, Dr. Moshe Cipro, yesterday. She did labs which showed an elevated potassium and slight worsening of CKD.  She called him and recommended that he come to the ED.  She called me as well.  Pt denies pain, but is tired.           Past Medical History:  Diagnosis Date   Acute left-sided low back pain with left-sided sciatica 11/04/2020   Anxiety    Arthritis    Depression 2001   Diabetes mellitus 2010   GERD (gastroesophageal reflux disease)    Hyperlipidemia    Hypertension 2005    Patient Active Problem List   Diagnosis Date Noted   Symptomatic anemia 07/10/2021   Chronic kidney disease 07/10/2021   Type 2 diabetes mellitus with diabetic neuropathy, unspecified ([place]) 07/06/2021   Seasonal allergies 09/12/2020   Nasal congestion 09/12/2020   Osteoarthritis of right knee 09/02/2020   Type 2 diabetes mellitus with stage 3a chronic kidney disease, with long-term current use of insulin ([place]) 02/05/2020   Vitamin D deficiency 01/11/2020   Obesity (BMI 30.0-34.9) 09/03/2019   Dental caries 09/03/2019   Chronic elbow pain, left 06/23/2019   Type 2 diabetes mellitus with hyperglycemia ([place]) 05/03/2019   Chest pain with moderate risk of acute coronary syndrome 09/03/2018   Mixed hyperlipidemia 05/29/2017   Spasm of thoracic back muscle 10/06/2016   Pain in joint involving multiple sites 02/12/2016   GERD (gastroesophageal reflux disease) 02/12/2016   Essential hypertension, benign 02/10/2016   Anxiety and depression 02/10/2016     Past Surgical History:  Procedure Laterality Date   COLONOSCOPY N/A 07/07/2016   Procedure: COLONOSCOPY;  Surgeon: Rogene Houston, MD;  Location: [facility];  Service: Endoscopy;  Laterality: N/A;  1030   FINGER SURGERY Left 1974   fifth , trauma on job, had to replace/repair finger   HERNIA REPAIR  [id number]   x3, umbilical and bilateral hernia       Family History  Problem Relation Age of Onset   Stroke Mother     Social History   Tobacco Use   Smoking status: Former    Packs/day: 0.50    Years: 30.00    Pack years: 15.00    Types: Cigarettes    Quit date: 01/20/2011    Years since quitting: 10.4   Smokeless tobacco: Never  Vaping Use   Vaping Use: Never used  Substance Use Topics   Alcohol use: No   Drug use: No    Home Medications Prior to Admission medications   Medication Sig Start Date End Date Taking? Authorizing Provider  albendazole (ALBENZA) 200 MG tablet Take two tablets once today on an empty stomach, and repeat the same dose , taking two tablets one time only in two weeks Patient taking differently: Take 400 mg by mouth See admin instructions. Take two tablets once today on an empty stomach, and repeat the same dose , taking two tablets one time only in two  weeks 07/17/21  Yes Fayrene Helper, MD  albuterol (VENTOLIN HFA) 108 (90 Base) MCG/ACT inhaler Inhale 2 puffs into the lungs daily as needed for wheezing or shortness of breath.   Yes [provider]  amLODipine (NORVASC) 10 MG tablet TAKE 1 TABLET EVERY DAY Patient taking differently: Take 10 mg by mouth every morning. 06/24/21  Yes Fayrene Helper, MD  aspirin EC 81 MG tablet Take 81 mg by mouth every morning. 03/08/16  Yes Fayrene Helper, MD  Doxylamine-Phenylephrine-APAP (VICKS SINEX DAYQUIL/NYQUIL PO) Take 1 tablet by mouth daily as needed (FOR COUGH-CONGESTION IN CHEST).   Yes [provider]  gabapentin (NEURONTIN) 100 MG capsule Take 1 capsule (100 mg total) by mouth  3 (three) times daily. 07/06/21  Yes Noreene Larsson, NP  insulin aspart (NOVOLOG FLEXPEN) 100 UNIT/ML FlexPen Inject 5 Units into the skin 3 (three) times daily with meals. 07/12/21  Yes Allie Bossier, MD  LANTUS SOLOSTAR 100 UNIT/ML Solostar Pen INJECT 45 UNITS UNDER THE SKIN EVERY MORNING (DOSE INCREASE) Patient taking differently: Inject 55 Units into the skin every evening. 2330 06/24/21  Yes Fayrene Helper, MD  rosuvastatin (CRESTOR) 5 MG tablet TAKE 1 TABLET EVERY MONDAY, WEDNESDAY AND FRIDAY (DISCONTINUE PRAVASTATIN) Patient taking differently: Take 5 mg by mouth every Monday, Wednesday, and Friday. TAKE 1 TABLET EVERY MONDAY, WEDNESDAY AND FRIDAY (DISCONTINUE PRAVASTATIN) 06/01/21  Yes Fayrene Helper, MD  sodium chloride (OCEAN) 0.65 % SOLN nasal spray Place 1 spray into both nostrils as needed for congestion.   Yes [provider]  montelukast (SINGULAIR) 10 MG tablet Take 1 tablet (10 mg total) by mouth daily. Patient not taking: No sig reported 07/12/21 07/12/22  Allie Bossier, MD    Allergies    Ibuprofen, Lisinopril, Metformin and related, and Strawberry [berry]  Review of Systems   Review of Systems  Neurological:  Positive for weakness.  All other systems reviewed and are negative.  Physical Exam Updated Vital Signs BP 127/75 (BP Location: Right Arm)   Pulse 80   Temp 98.8 F (37.1 C) (Oral)   Resp 18   Ht '5\' 8"'$  (1.727 m)   Wt 93.2 kg   SpO2 100%   BMI 31.26 kg/m   Physical Exam Vitals and nursing note reviewed.  Constitutional:      Appearance: Normal appearance.  HENT:     Head: Normocephalic and atraumatic.     Right Ear: External ear normal.     Left Ear: External ear normal.     Nose: Nose normal.     Mouth/Throat:     Mouth: Mucous membranes are moist.     Pharynx: Oropharynx is clear.  Eyes:     Extraocular Movements: Extraocular movements intact.     Conjunctiva/sclera: Conjunctivae normal.     Pupils: Pupils are equal, round, and  reactive to light.  Cardiovascular:     Rate and Rhythm: Normal rate and regular rhythm.     Pulses: Normal pulses.     Heart sounds: Normal heart sounds.  Pulmonary:     Effort: Pulmonary effort is normal.     Breath sounds: Normal breath sounds.  Abdominal:     General: Abdomen is flat. Bowel sounds are normal.     Palpations: Abdomen is soft.  Musculoskeletal:        General: Normal range of motion.     Cervical back: Normal range of motion and neck supple.  Skin:    General: Skin is  warm.     Capillary Refill: Capillary refill takes less than 2 seconds.  Neurological:     General: No focal deficit present.     Mental Status: He is alert and oriented to person, place, and time.  Psychiatric:        Mood and Affect: Mood normal.        Behavior: Behavior normal.    ED Results / Procedures / Treatments   Labs (all labs ordered are listed, but only abnormal results are displayed) Labs Reviewed  BASIC METABOLIC PANEL - Abnormal; Notable for the following components:      Result Value   Potassium 5.2 (*)    Glucose, Bld 174 (*)    BUN 37 (*)    Creatinine, Ser 2.13 (*)    Calcium 8.7 (*)    GFR, Estimated 34 (*)    All other components within normal limits  CBC WITH DIFFERENTIAL/PLATELET - Abnormal; Notable for the following components:   RBC 1.80 (*)    Hemoglobin 7.2 (*)    HCT 21.4 (*)    MCV 118.9 (*)    MCH 40.0 (*)    RDW 23.4 (*)    nRBC 1.4 (*)    All other components within normal limits  LACTATE DEHYDROGENASE - Abnormal; Notable for the following components:   LDH 2,271 (*)    All other components within normal limits  RETICULOCYTES - Abnormal; Notable for the following components:   RBC. 1.81 (*)    Immature Retic Fract 25.4 (*)    All other components within normal limits  URINALYSIS, ROUTINE W REFLEX MICROSCOPIC  TYPE AND SCREEN  PREPARE RBC (CROSSMATCH)    EKG EKG Interpretation  Date/Time:  Saturday July 18 2021 16:18:35 EDT Ventricular  Rate:  82 PR Interval:  144 QRS Duration: 93 QT Interval:  387 QTC Calculation: 452 R Axis:   53 Text Interpretation: Sinus rhythm No significant change since last tracing Confirmed by Isla Pence [phone]) on 07/18/2021 4:23:12 PM  Radiology CT HEAD WO CONTRAST (5MM)  Result Date: 07/18/2021 CLINICAL DATA:  Fatigue, loss of balance EXAM: CT HEAD WITHOUT CONTRAST TECHNIQUE: Contiguous axial images were obtained from the base of the skull through the vertex without intravenous contrast. COMPARISON:  None. FINDINGS: Brain: No acute infarct or hemorrhage. Lateral ventricles and midline structures are unremarkable. No acute extra-axial fluid collections. No mass effect. Vascular: No hyperdense vessel or unexpected calcification. Skull: Normal. Negative for fracture or focal lesion. Sinuses/Orbits: No acute finding. Other: None. IMPRESSION: 1. No acute intracranial process. Electronically Signed   By: Randa Ngo M.D.   On: 07/18/2021 17:43    Procedures Procedures   Medications Ordered in ED Medications  0.9 %  sodium chloride infusion (has no administration in time range)  sodium chloride 0.9 % bolus 500 mL (500 mLs Intravenous New Bag/Given 07/18/21 1705)    ED Course  I have reviewed the triage vital signs and the nursing notes.  Pertinent labs & imaging results that were available during my care of the patient were reviewed by me and considered in my medical decision making (see chart for details).    MDM Rules/Calculators/A&P                           Pt's CBC shows multiple abnormalities with the shapes of the cells.  I did speak with Dr. Delton Coombes (heme-onc) who recommended 1 more unit of blood and he will see him in  the office on Monday or Tuesday.  Dr. Moshe Cipro gave pt a dose of Vitamin B12 in the office yesterday.  She did refer him to GI as well.  Pt's K is better than it was yesterday, but Cr is slightly higher.  Pt given IVFs.  He is on Spironolactone, so that will be  stopped.    CRITICAL CARE Performed by: Isla Pence   Total critical care time: 30 minutes  Critical care time was exclusive of separately billable procedures and treating other patients.  Critical care was necessary to treat or prevent imminent or life-threatening deterioration.  Critical care was time spent personally by me on the following activities: development of treatment plan with patient and/or surrogate as well as nursing, discussions with consultants, evaluation of patient's response to treatment, examination of patient, obtaining history from patient or surrogate, ordering and performing treatments and interventions, ordering and review of laboratory studies, ordering and review of radiographic studies, pulse oximetry and re-evaluation of patient's condition.  Final Clinical Impression(s) / ED Diagnoses Final diagnoses:  Symptomatic anemia  AKI (acute kidney injury) ([place])  Hyperkalemia    Rx / DC Orders ED Discharge Orders     None        Isla Pence, MD 07/18/21 Einar Crow

## 2021-07-19 ENCOUNTER — Encounter: Payer: Self-pay | Admitting: Family Medicine

## 2021-07-19 DIAGNOSIS — L290 Pruritus ani: Secondary | ICD-10-CM | POA: Insufficient documentation

## 2021-07-19 LAB — TYPE AND SCREEN
ABO/RH(D): O POS
Antibody Screen: NEGATIVE
Unit division: 0

## 2021-07-19 LAB — BPAM RBC
Blood Product Expiration Date: 202210162359
ISSUE DATE / TIME: 202209101825
Unit Type and Rh: 5100

## 2021-07-19 NOTE — Assessment & Plan Note (Signed)
Symptomatic anemia with B12 deficiency and possible worm infestation Treat empirically, resubmit stool and start monthly B12

## 2021-07-19 NOTE — Progress Notes (Signed)
Norman Peters     MRN: [id number]      DOB: December 27, 1955   HPI Norman Peters is here for follow up recent hospitalization from 09/02 to 07/12/2021 admitted for symptomatic anemia, unclear where loss is occurring, needs GI f/u. States he feels as if he has worm infestation with anal itching, specimen sent during hospitalization inadequate for testing for worms has photo on his phone with what looks like pinworms on tapre in stool Feels tired Reports BRRB since his d/c ROS Denies recent fever or chills. Denies sinus pressure, nasal congestion, ear pain or sore throat. Denies chest congestion, productive cough or wheezing. Denies chest pains, palpitations and leg swelling    Denies dysuria, frequency, hesitancy or incontinence. Denies joint pain, swelling and limitation in mobility. Denies headaches, seizures, numbness, or tingling. Denies depression, anxiety or insomnia. Denies skin break down or rash.   PE  BP 121/62   Pulse 96   Resp 16   Ht '5\' 8"'$  (1.727 m)   Wt 202 lb (91.6 kg)   SpO2 96%   BMI 30.71 kg/m   Patient alert and oriented and in no cardiopulmonary distress.Ill appearing  HEENT: No facial asymmetry, EOMI,     Neck supple .  Chest: Clear to auscultation bilaterally.  CVS: S1, S2 no murmurs, no S3.Regular rate.  ABD: Soft , non tenderr.   Ext: No edema  MS: Adequate ROM spine, shoulders, hips and knees.  Skin: Intact, no ulcerations or rash noted.  Psych: Good eye contact, normal affect. Memory intact not anxious or depressed appearing.  CNS: CN 2-12 intact, power,  normal throughout.no focal deficits noted.   Assessment & Plan  Anal itching Possible parasitic infestation Stool to be resubmitted for O/P  Medication prescribed  Essential hypertension, benign Controlled, no change in medication   Type 2 diabetes mellitus with diabetic neuropathy, unspecified ([place]) Uncontrolled Norman Peters is reminded of the importance of commitment to daily physical  activity for 30 minutes or more, as able and the need to limit carbohydrate intake to 30 to 60 grams per meal to help with blood sugar control.   The need to take medication as prescribed, test blood sugar as directed, and to call between visits if there is a concern that blood sugar is uncontrolled is also discussed.  Managed by Endo, has follow up  Norman Peters is reminded of the importance of daily foot exam, annual eye examination, and good blood sugar, blood pressure and cholesterol control.  Diabetic Labs Latest Ref Rng & Units 07/18/2021 07/17/2021 07/12/2021 07/11/2021 07/11/2021  HbA1c 4.0 - 5.6 % - - - - -  Microalbumin mg/L - - - - -  Micro/Creat Ratio - - - - - -  Chol 100 - 199 mg/dL - - - - -  HDL >39 mg/dL - - - - -  Calc LDL 0 - 99 mg/dL - - - - -  Triglycerides 0 - 149 mg/dL - - - - -  Creatinine 0.61 - 1.24 mg/dL 2.13(H) 1.98(H) 1.56(H) 1.48(H) 1.52(H)   BP/Weight 07/18/2021 07/17/2021 07/12/2021 07/10/2021 07/06/2021 06/17/2021 123456  Systolic BP Q000111Q 123XX123 [id number] - [id number]  Diastolic BP 78 62 65 - 65 71 73  Wt. (Lbs) 205.56 202 - 198 209 209 -  BMI 31.26 30.71 - 30.11 31.78 31.78 -   Foot/eye exam completion dates Latest Ref Rng & Units 07/01/2021 05/05/2021  Eye Exam No Retinopathy Retinopathy(A) No Retinopathy  Foot Form Completion - - -  Hospital discharge follow-up Patient in for follow up of recent hospitalization. Discharge summary, and laboratory and radiology data are reviewed, and any questions or concerns  are discussed. Specific issues requiring follow up are specifically addressed.   Anemia Symptomatic anemia with B12 deficiency and possible worm infestation Treat empirically, resubmit stool and start monthly B12  Type 2 diabetes mellitus with hyperglycemia ([place]) Uncontrolled but reports improving Norman Peters is reminded of the importance of commitment to daily physical activity for 30 minutes or more, as able and the need to limit carbohydrate intake to 30  to 60 grams per meal to help with blood sugar control.   The need to take medication as prescribed, test blood sugar as directed, and to call between visits if there is a concern that blood sugar is uncontrolled is also discussed.   Norman Peters is reminded of the importance of daily foot exam, annual eye examination, and good blood sugar, blood pressure and cholesterol control.  Diabetic Labs Latest Ref Rng & Units 07/18/2021 07/17/2021 07/12/2021 07/11/2021 07/11/2021  HbA1c 4.0 - 5.6 % - - - - -  Microalbumin mg/L - - - - -  Micro/Creat Ratio - - - - - -  Chol 100 - 199 mg/dL - - - - -  HDL >39 mg/dL - - - - -  Calc LDL 0 - 99 mg/dL - - - - -  Triglycerides 0 - 149 mg/dL - - - - -  Creatinine 0.61 - 1.24 mg/dL 2.13(H) 1.98(H) 1.56(H) 1.48(H) 1.52(H)   BP/Weight 07/18/2021 07/17/2021 07/12/2021 07/10/2021 07/06/2021 06/17/2021 123456  Systolic BP Q000111Q 123XX123 [id number] - [id number]  Diastolic BP 78 62 65 - 65 71 73  Wt. (Lbs) 205.56 202 - 198 209 209 -  BMI 31.26 30.71 - 30.11 31.78 31.78 -   Foot/eye exam completion dates Latest Ref Rng & Units 07/01/2021 05/05/2021  Eye Exam No Retinopathy Retinopathy(A) No Retinopathy  Foot Form Completion - - -   Managed by Endo

## 2021-07-19 NOTE — Assessment & Plan Note (Signed)
Uncontrolled Norman Peters is reminded of the importance of commitment to daily physical activity for 30 minutes or more, as able and the need to limit carbohydrate intake to 30 to 60 grams per meal to help with blood sugar control.   The need to take medication as prescribed, test blood sugar as directed, and to call between visits if there is a concern that blood sugar is uncontrolled is also discussed.  Managed by Endo, has follow up  Norman Peters is reminded of the importance of daily foot exam, annual eye examination, and good blood sugar, blood pressure and cholesterol control.  Diabetic Labs Latest Ref Rng & Units 07/18/2021 07/17/2021 07/12/2021 07/11/2021 07/11/2021  HbA1c 4.0 - 5.6 % - - - - -  Microalbumin mg/L - - - - -  Micro/Creat Ratio - - - - - -  Chol 100 - 199 mg/dL - - - - -  HDL >39 mg/dL - - - - -  Calc LDL 0 - 99 mg/dL - - - - -  Triglycerides 0 - 149 mg/dL - - - - -  Creatinine 0.61 - 1.24 mg/dL 2.13(H) 1.98(H) 1.56(H) 1.48(H) 1.52(H)   BP/Weight 07/18/2021 07/17/2021 07/12/2021 07/10/2021 07/06/2021 06/17/2021 123456  Systolic BP Q000111Q 123XX123 [id number] - [id number]  Diastolic BP 78 62 65 - 65 71 73  Wt. (Lbs) 205.56 202 - 198 209 209 -  BMI 31.26 30.71 - 30.11 31.78 31.78 -   Foot/eye exam completion dates Latest Ref Rng & Units 07/01/2021 05/05/2021  Eye Exam No Retinopathy Retinopathy(A) No Retinopathy  Foot Form Completion - - -

## 2021-07-19 NOTE — Assessment & Plan Note (Signed)
Possible parasitic infestation Stool to be resubmitted for O/P  Medication prescribed

## 2021-07-19 NOTE — Assessment & Plan Note (Signed)
Uncontrolled but reports improving Norman Peters is reminded of the importance of commitment to daily physical activity for 30 minutes or more, as able and the need to limit carbohydrate intake to 30 to 60 grams per meal to help with blood sugar control.   The need to take medication as prescribed, test blood sugar as directed, and to call between visits if there is a concern that blood sugar is uncontrolled is also discussed.   Norman Peters is reminded of the importance of daily foot exam, annual eye examination, and good blood sugar, blood pressure and cholesterol control.  Diabetic Labs Latest Ref Rng & Units 07/18/2021 07/17/2021 07/12/2021 07/11/2021 07/11/2021  HbA1c 4.0 - 5.6 % - - - - -  Microalbumin mg/L - - - - -  Micro/Creat Ratio - - - - - -  Chol 100 - 199 mg/dL - - - - -  HDL >39 mg/dL - - - - -  Calc LDL 0 - 99 mg/dL - - - - -  Triglycerides 0 - 149 mg/dL - - - - -  Creatinine 0.61 - 1.24 mg/dL 2.13(H) 1.98(H) 1.56(H) 1.48(H) 1.52(H)   BP/Weight 07/18/2021 07/17/2021 07/12/2021 07/10/2021 07/06/2021 06/17/2021 123456  Systolic BP Q000111Q 123XX123 [id number] - [id number]  Diastolic BP 78 62 65 - 65 71 73  Wt. (Lbs) 205.56 202 - 198 209 209 -  BMI 31.26 30.71 - 30.11 31.78 31.78 -   Foot/eye exam completion dates Latest Ref Rng & Units 07/01/2021 05/05/2021  Eye Exam No Retinopathy Retinopathy(A) No Retinopathy  Foot Form Completion - - -   Managed by Endo

## 2021-07-19 NOTE — Assessment & Plan Note (Signed)
Patient in for follow up of recent hospitalization. Discharge summary, and laboratory and radiology data are reviewed, and any questions or concerns  are discussed. Specific issues requiring follow up are specifically addressed.  

## 2021-07-19 NOTE — Assessment & Plan Note (Signed)
Controlled, no change in medication  

## 2021-07-20 ENCOUNTER — Telehealth: Payer: Self-pay

## 2021-07-20 NOTE — Telephone Encounter (Signed)
Transition Care Management Unsuccessful Follow-up Telephone Call  Date of discharge and from where:  07/18/2021, [facility]  Diagnosis: Anemia   Attempts:  1st Attempt  Reason for unsuccessful TCM follow-up call:  Left voice message

## 2021-07-21 ENCOUNTER — Encounter (INDEPENDENT_AMBULATORY_CARE_PROVIDER_SITE_OTHER): Payer: Self-pay | Admitting: *Deleted

## 2021-07-21 ENCOUNTER — Telehealth: Payer: Self-pay

## 2021-07-21 ENCOUNTER — Other Ambulatory Visit: Payer: Self-pay

## 2021-07-21 DIAGNOSIS — R062 Wheezing: Secondary | ICD-10-CM

## 2021-07-21 DIAGNOSIS — J302 Other seasonal allergic rhinitis: Secondary | ICD-10-CM

## 2021-07-21 MED ORDER — ALBUTEROL SULFATE HFA 108 (90 BASE) MCG/ACT IN AERS: 2.0000 | INHALATION_SPRAY | Freq: Every day | RESPIRATORY_TRACT | 1 refills | Status: DC | PRN

## 2021-07-21 NOTE — Telephone Encounter (Signed)
Patient called need med refill  albuterol (VENTOLIN HFA) 108 (90 Base) MCG/ACT inhaler   Pharmacy: [facility]

## 2021-07-21 NOTE — Telephone Encounter (Signed)
Rx refilled.

## 2021-07-21 NOTE — Telephone Encounter (Signed)
Transition Care Management Unsuccessful Follow-up Telephone Call  Date of discharge and from where:  07/18/2021, [facility]  Diagnosis: Anemia   Attempts:  2nd Attempt  Reason for unsuccessful TCM follow-up call:  Left voice message. Also called pt's wife, Shirleen and LM.

## 2021-07-21 NOTE — Progress Notes (Signed)
[place] [address] [place] [address]   CLINIC:  Medical Oncology/Hematology  Patient Care Team: Fayrene Helper, MD as PCP - General (Family Medicine) Satira Sark, MD as PCP - Cardiology (Cardiology) Derek Jack, MD as Medical Oncologist (Hematology)  CHIEF COMPLAINTS/PURPOSE OF CONSULTATION:  Evaluation of anemia and vitamin B-12 deficiency  HISTORY OF PRESENTING ILLNESS:  Norman Peters 65 y.o. male is here because of evaluation of anemia and vitamin B-12 deficiency, at the request of Dr. Moshe Cipro.  Today he reports feeling good. He reports occasional hematochezia and constipation as well as abdominal pain. He has not had a BM in 1 day, and his appetite is reduced. He reports fatigue and SOB starting 3 weeks ago which was improved after receiving 2 blood infusions. He denies any unexpected weight changes, fevers, and night sweats. He reports a history of DM, and he denies history of CVA or MI. He started taking Gabapentin 1 week ago for his neuropathy. He reports intermittent pain in his knees due to arthritis. He reports difficulty with balance while walking. He lives at home with his wife. Prior to retirement he worked in [employer], and he denies any excessive or unusual chemical exposure. He quit smoking 10 years ago after smoking after smoking 1 ppd for 40 years. He cannot recall any family history of cancer. His brother had polio.   MEDICAL HISTORY:  Past Medical History:  Diagnosis Date   Acute left-sided low back pain with left-sided sciatica 11/04/2020   Anxiety    Arthritis    Depression 2001   Diabetes mellitus 2010   GERD (gastroesophageal reflux disease)    Hyperlipidemia    Hypertension 2005    SURGICAL HISTORY: Past Surgical History:  Procedure Laterality Date   COLONOSCOPY N/A 07/07/2016   Procedure: COLONOSCOPY;  Surgeon: Rogene Houston, MD;  Location: [facility];  Service: Endoscopy;   Laterality: N/A;  1030   FINGER SURGERY Left 1974   fifth , trauma on job, had to replace/repair finger   HERNIA REPAIR  5993   x3, umbilical and bilateral hernia    SOCIAL HISTORY: Social History   Socioeconomic History   Marital status: Married    Spouse name: Not on file   Number of children: Not on file   Years of education: Not on file   Highest education level: Not on file  Occupational History   Not on file  Tobacco Use   Smoking status: Former    Packs/day: 0.50    Years: 30.00    Pack years: 15.00    Types: Cigarettes    Quit date: 01/20/2011    Years since quitting: 10.5   Smokeless tobacco: Never  Vaping Use   Vaping Use: Never used  Substance and Sexual Activity   Alcohol use: No   Drug use: No   Sexual activity: Not Currently  Other Topics Concern   Not on file  Social History Narrative   Not on file   Social Determinants of Health   Financial Resource Strain: Low Risk    Difficulty of Paying Living Expenses: Not hard at all  Food Insecurity: No Food Insecurity   Worried About Charity fundraiser in the Last Year: Never true   [place] in the Last Year: Never true  Transportation Needs: No Transportation Needs   Lack of Transportation (Medical): No   Lack of Transportation (Non-Medical): No  Physical Activity: Insufficiently Active  Days of Exercise per Week: 5 days   Minutes of Exercise per Session: 20 min  Stress: No Stress Concern Present   Feeling of Stress : Not at all  Social Connections: Socially Integrated   Frequency of Communication with Friends and Family: More than three times a week   Frequency of Social Gatherings with Friends and Family: Once a week   Attends Religious Services: More than 4 times per year   Active Member of [employer] or Organizations: Yes   Attends Music therapist: More than 4 times per year   Marital Status: Married  Human resources officer Violence: Not At Risk   Fear of Current or Ex-Partner: No    Emotionally Abused: No   Physically Abused: No   Sexually Abused: No    FAMILY HISTORY: Family History  Problem Relation Age of Onset   Stroke Mother     ALLERGIES:  is allergic to ibuprofen, lisinopril, metformin and related, and strawberry [berry].  MEDICATIONS:  Current Outpatient Medications  Medication Sig Dispense Refill   albendazole (ALBENZA) 200 MG tablet Take two tablets once today on an empty stomach, and repeat the same dose , taking two tablets one time only in two weeks (Patient taking differently: Take 400 mg by mouth See admin instructions. Take two tablets once today on an empty stomach, and repeat the same dose , taking two tablets one time only in two weeks) 4 tablet 0   albuterol (VENTOLIN HFA) 108 (90 Base) MCG/ACT inhaler Inhale 2 puffs into the lungs daily as needed for wheezing or shortness of breath. 1 each 1   amLODipine (NORVASC) 10 MG tablet TAKE 1 TABLET EVERY DAY (Patient taking differently: Take 10 mg by mouth every morning.) 90 tablet 1   aspirin EC 81 MG tablet Take 81 mg by mouth every morning.     gabapentin (NEURONTIN) 100 MG capsule Take 1 capsule (100 mg total) by mouth 3 (three) times daily. 90 capsule 3   insulin aspart (NOVOLOG FLEXPEN) 100 UNIT/ML FlexPen Inject 5 Units into the skin 3 (three) times daily with meals. 15 mL 0   LANTUS SOLOSTAR 100 UNIT/ML Solostar Pen INJECT 45 UNITS UNDER THE SKIN EVERY MORNING (DOSE INCREASE) (Patient taking differently: Inject 55 Units into the skin every evening. 2330) 45 mL 3   montelukast (SINGULAIR) 10 MG tablet Take 1 tablet (10 mg total) by mouth daily. 30 tablet 0   rosuvastatin (CRESTOR) 5 MG tablet TAKE 1 TABLET EVERY MONDAY, WEDNESDAY AND FRIDAY (DISCONTINUE PRAVASTATIN) (Patient taking differently: Take 5 mg by mouth every Monday, Wednesday, and Friday. TAKE 1 TABLET EVERY MONDAY, WEDNESDAY AND FRIDAY (DISCONTINUE PRAVASTATIN)) 36 tablet 3   Doxylamine-Phenylephrine-APAP (VICKS SINEX DAYQUIL/NYQUIL PO)  Take 1 tablet by mouth daily as needed (FOR COUGH-CONGESTION IN CHEST). (Patient not taking: Reported on 07/22/2021)     sodium chloride (OCEAN) 0.65 % SOLN nasal spray Place 1 spray into both nostrils as needed for congestion. (Patient not taking: Reported on 07/22/2021)     No current facility-administered medications for this visit.    REVIEW OF SYSTEMS:   Review of Systems  Constitutional:  Positive for fatigue (50%). Negative for appetite change (75%) and unexpected weight change.  Respiratory:  Positive for cough and shortness of breath (with exertion).   Gastrointestinal:  Positive for abdominal pain (5/10 R side) and constipation.  Musculoskeletal:  Positive for arthralgias (knees) and gait problem (off balance).  Neurological:  Positive for dizziness, gait problem (off balance), headaches and numbness (neuropathy).  Psychiatric/Behavioral:  Positive for sleep disturbance.   All other systems reviewed and are negative.   PHYSICAL EXAMINATION: ECOG PERFORMANCE STATUS: 1 - Symptomatic but completely ambulatory  Vitals:   07/22/21 0922  BP: 133/76  Pulse: 86  Resp: 18  Temp: 97.9 F (36.6 C)  SpO2: 99%   Filed Weights   07/22/21 0922  Weight: 204 lb 3.2 oz (92.6 kg)   Physical Exam Vitals reviewed.  Constitutional:      Appearance: Normal appearance.  Cardiovascular:     Rate and Rhythm: Normal rate and regular rhythm.     Pulses: Normal pulses.     Heart sounds: Normal heart sounds.  Pulmonary:     Effort: Pulmonary effort is normal.     Breath sounds: Normal breath sounds.  Abdominal:     Palpations: Abdomen is soft. There is no hepatomegaly, splenomegaly or mass.     Tenderness: There is no abdominal tenderness.  Lymphadenopathy:     Upper Body:     Right upper body: No supraclavicular, axillary or pectoral adenopathy.     Left upper body: No supraclavicular, axillary or pectoral adenopathy.  Neurological:     General: No focal deficit present.     Mental  Status: He is alert and oriented to person, place, and time.  Psychiatric:        Mood and Affect: Mood normal.        Behavior: Behavior normal.     LABORATORY DATA:  I have reviewed the data as listed Recent Results (from the past 2160 hour(s))  HM DIABETES EYE EXAM     Status: None   Collection Time: 05/05/21 12:00 AM  Result Value Ref Range   HM Diabetic Eye Exam No Retinopathy No Retinopathy  Comprehensive metabolic panel     Status: Abnormal   Collection Time: 06/10/21  8:44 AM  Result Value Ref Range   Glucose 232 (H) 65 - 99 mg/dL   BUN 16 8 - 27 mg/dL   Creatinine, Ser 1.57 (H) 0.76 - 1.27 mg/dL   eGFR 49 (L) >59 mL/min/1.73   BUN/Creatinine Ratio 10 10 - 24   Sodium 141 134 - 144 mmol/L   Potassium 5.2 3.5 - 5.2 mmol/L   Chloride 102 96 - 106 mmol/L   CO2 21 20 - 29 mmol/L   Calcium 9.2 8.6 - 10.2 mg/dL   Total Protein 7.3 6.0 - 8.5 g/dL   Albumin 4.0 3.8 - 4.8 g/dL   Globulin, Total 3.3 1.5 - 4.5 g/dL   Albumin/Globulin Ratio 1.2 1.2 - 2.2   Bilirubin Total 1.3 (H) 0.0 - 1.2 mg/dL   Alkaline Phosphatase 83 44 - 121 IU/L   AST 35 0 - 40 IU/L   ALT 13 0 - 44 IU/L  Lipid Panel w/o Chol/HDL Ratio     Status: Abnormal   Collection Time: 06/10/21  8:44 AM  Result Value Ref Range   Cholesterol, Total 132 100 - 199 mg/dL   Triglycerides 158 (H) 0 - 149 mg/dL   HDL 28 (L) >39 mg/dL   VLDL Cholesterol Cal 28 5 - 40 mg/dL   LDL Chol Calc (NIH) 76 0 - 99 mg/dL  T4, free     Status: None   Collection Time: 06/10/21  8:44 AM  Result Value Ref Range   Free T4 1.14 0.82 - 1.77 ng/dL  TSH     Status: None   Collection Time: 06/10/21  8:44 AM  Result Value Ref Range  TSH 2.100 0.450 - 4.500 uIU/mL  HgB A1c     Status: Abnormal   Collection Time: 06/17/21  2:15 PM  Result Value Ref Range   Hemoglobin A1C 11.7 (A) 4.0 - 5.6 %   HbA1c POC (<> result, manual entry)     HbA1c, POC (prediabetic range)     HbA1c, POC (controlled diabetic range)    HM DIABETES EYE EXAM      Status: Abnormal   Collection Time: 07/01/21 12:00 AM  Result Value Ref Range   HM Diabetic Eye Exam Retinopathy (A) No Retinopathy  CBG monitoring, ED     Status: Abnormal   Collection Time: 07/10/21  2:26 PM  Result Value Ref Range   Glucose-Capillary 152 (H) 70 - 99 mg/dL    Comment: Glucose reference range applies only to samples taken after fasting for at least 8 hours.  Urinalysis, Routine w reflex microscopic     Status: Abnormal   Collection Time: 07/10/21  3:32 PM  Result Value Ref Range   Color, Urine YELLOW YELLOW   APPearance CLEAR CLEAR   Specific Gravity, Urine 1.020 1.005 - 1.030   pH 5.5 5.0 - 8.0   Glucose, UA NEGATIVE NEGATIVE mg/dL   Hgb urine dipstick TRACE (A) NEGATIVE   Bilirubin Urine NEGATIVE NEGATIVE   Ketones, ur NEGATIVE NEGATIVE mg/dL   Protein, ur NEGATIVE NEGATIVE mg/dL   Nitrite NEGATIVE NEGATIVE   Leukocytes,Ua NEGATIVE NEGATIVE    Comment: Performed at [facility], [address], [place], [place] [address]  Urinalysis, Microscopic (reflex)     Status: None   Collection Time: 07/10/21  3:32 PM  Result Value Ref Range   RBC / HPF 0-5 0 - 5 RBC/hpf   WBC, UA 0-5 0 - 5 WBC/hpf   Bacteria, UA NONE SEEN NONE SEEN   Squamous Epithelial / LPF 0-5 0 - 5    Comment: Performed at [facility], [address], [place], [place] [address]  Resp Panel by RT-PCR (Flu A&B, Covid) Nasopharyngeal Swab     Status: None   Collection Time: 07/10/21  3:34 PM   Specimen: Nasopharyngeal Swab; Nasopharyngeal(NP) swabs in vial transport medium  Result Value Ref Range   SARS Coronavirus 2 by RT PCR NEGATIVE NEGATIVE    Comment: (NOTE) SARS-CoV-2 target nucleic acids are NOT DETECTED.  The SARS-CoV-2 RNA is generally detectable in upper respiratory specimens during the acute phase of infection. The lowest concentration of SARS-CoV-2 viral copies this assay can detect is 138 copies/mL. A negative result does not preclude SARS-Cov-2 infection and should not be used as  the sole basis for treatment or other patient management decisions. A negative result may occur with  improper specimen collection/handling, submission of specimen other than nasopharyngeal swab, presence of viral mutation(s) within the areas targeted by this assay, and inadequate number of viral copies(<138 copies/mL). A negative result must be combined with clinical observations, patient history, and epidemiological information. The expected result is Negative.  Fact Sheet for Patients:  EntrepreneurPulse.com.au  Fact Sheet for Healthcare Providers:  IncredibleEmployment.be  This test is no t yet approved or cleared by the [place] FDA and  has been authorized for detection and/or diagnosis of SARS-CoV-2 by FDA under an Emergency Use Authorization (EUA). This EUA will remain  in effect (meaning this test can be used) for the duration of the COVID-19 declaration under Section 564(b)(1) of the Act, 21 U.S.C.section 360bbb-3(b)(1), unless the authorization is terminated  or revoked sooner.  Influenza A by PCR NEGATIVE NEGATIVE   Influenza B by PCR NEGATIVE NEGATIVE    Comment: (NOTE) The Xpert Xpress SARS-CoV-2/FLU/RSV plus assay is intended as an aid in the diagnosis of influenza from Nasopharyngeal swab specimens and should not be used as a sole basis for treatment. Nasal washings and aspirates are unacceptable for Xpert Xpress SARS-CoV-2/FLU/RSV testing.  Fact Sheet for Patients: EntrepreneurPulse.com.au  Fact Sheet for Healthcare Providers: IncredibleEmployment.be  This test is not yet approved or cleared by the [place] FDA and has been authorized for detection and/or diagnosis of SARS-CoV-2 by FDA under an Emergency Use Authorization (EUA). This EUA will remain in effect (meaning this test can be used) for the duration of the COVID-19 declaration under Section 564(b)(1) of the Act, 21  U.S.C. section 360bbb-3(b)(1), unless the authorization is terminated or revoked.  Performed at [facility], [address], [place], [place] [address]   Comprehensive metabolic panel     Status: Abnormal   Collection Time: 07/10/21  3:54 PM  Result Value Ref Range   Sodium 132 (L) 135 - 145 mmol/L   Potassium 5.0 3.5 - 5.1 mmol/L   Chloride 102 98 - 111 mmol/L   CO2 24 22 - 32 mmol/L   Glucose, Bld 113 (H) 70 - 99 mg/dL    Comment: Glucose reference range applies only to samples taken after fasting for at least 8 hours.   BUN 34 (H) 8 - 23 mg/dL   Creatinine, Ser 1.63 (H) 0.61 - 1.24 mg/dL   Calcium 8.9 8.9 - 10.3 mg/dL   Total Protein 8.0 6.5 - 8.1 g/dL   Albumin 4.0 3.5 - 5.0 g/dL   AST 53 (H) 15 - 41 U/L   ALT 19 0 - 44 U/L   Alkaline Phosphatase 55 38 - 126 U/L   Total Bilirubin 1.7 (H) 0.3 - 1.2 mg/dL   GFR, Estimated 46 (L) >60 mL/min    Comment: (NOTE) Calculated using the CKD-EPI Creatinine Equation (2021)    Anion gap 6 5 - 15    Comment: Performed at [facility], [address], [place], [place] [address]  CBC with Differential     Status: Abnormal   Collection Time: 07/10/21  3:54 PM  Result Value Ref Range   WBC 5.3 4.0 - 10.5 K/uL   RBC 1.66 (L) 4.22 - 5.81 MIL/uL   Hemoglobin 6.9 (LL) 13.0 - 17.0 g/dL    Comment: REPEATED TO VERIFY THIS CRITICAL RESULT HAS VERIFIED AND BEEN CALLED TO GANT E BY LATISHA HENDERSON ON 09 02 2022 AT 1638, AND HAS BEEN READ BACK.     HCT 20.2 (L) 39.0 - 52.0 %   MCV 121.7 (H) 80.0 - 100.0 fL   MCH 41.6 (H) 26.0 - 34.0 pg   MCHC 34.2 30.0 - 36.0 g/dL   RDW 20.5 (H) 11.5 - 15.5 %   Platelets 205 150 - 400 K/uL   nRBC 1.1 (H) 0.0 - 0.2 %   Neutrophils Relative % 61 %   Neutro Abs 3.2 1.7 - 7.7 K/uL   Lymphocytes Relative 29 %   Lymphs Abs 1.5 0.7 - 4.0 K/uL   Monocytes Relative 0 %   Monocytes Absolute 0.0 (L) 0.1 - 1.0 K/uL   Eosinophils Relative 10 %   Eosinophils Absolute 0.5 0.0 - 0.5 K/uL   Basophils Relative 0 %    Basophils Absolute 0.0 0.0 - 0.1 K/uL   RBC Morphology RARE NUCLEATED RED BLOOD CELLS    Reactive,  Benign Lymphocytes PRESENT    Hypersegmented Neutrophils PRESENT    Schistocytes PRESENT    Polychromasia PRESENT    Basophilic Stippling PRESENT     Comment: Performed at [facility], [address] [place], [place] [address]  Pathologist smear review     Status: None   Collection Time: 07/10/21  3:54 PM  Result Value Ref Range   Path Review Reviewed By Violet Baldy, M.D.     Comment: 07/14/21 Macrocytic anemia with abnormal RBC morphology including schistocytes and micro spherocytes.  Hypersegmented neutrophils. Recommend evaluation for Hemolysis as well as Folate and B12 levels. Performed at [facility], [place] [address], [place], [place] [address]   ABO/Rh     Status: None   Collection Time: 07/10/21  3:54 PM  Result Value Ref Range   ABO/RH(D)      O POS Performed at [facility], [address], [place], [place] [address]   Protime-INR     Status: None   Collection Time: 07/10/21  5:16 PM  Result Value Ref Range   Prothrombin Time 13.7 11.4 - 15.2 seconds   INR 1.1 0.8 - 1.2    Comment: (NOTE) INR goal varies based on device and disease states. Performed at [facility], [address], [place], [place] [address]   Type and screen [facility]     Status: None   Collection Time: 07/10/21  5:16 PM  Result Value Ref Range   ABO/RH(D) O POS    Antibody Screen NEG    Sample Expiration 07/13/2021,2359    Unit Number [id number]    Blood Component Type RED CELLS,LR    Unit division 00    Status of Unit ISSUED,FINAL    Transfusion Status OK TO TRANSFUSE    Crossmatch Result      Compatible Performed at [facility], [address], [place], [place] [address]   Vitamin B12     Status: Abnormal   Collection Time: 07/10/21  5:16 PM  Result Value Ref Range   Vitamin B-12 <50 (L) 180 - 914 pg/mL    Comment: Performed at [facility], [address], [place], [place] [address]  Folate     Status: None   Collection Time: 07/10/21  5:16 PM  Result Value Ref Range   Folate 24.0 >5.9 ng/mL    Comment: Performed at [facility], [address] [place], [place] [address]  Iron and TIBC     Status: None   Collection Time: 07/10/21  5:16 PM  Result Value Ref Range   Iron 66 45 - 182 ug/dL   TIBC 256 250 - 450 ug/dL   Saturation Ratios 26 17.9 - 39.5 %   UIBC 190 ug/dL    Comment: Performed at [facility], [address], [place], [place] [address]  Ferritin     Status: Abnormal   Collection Time: 07/10/21  5:16 PM  Result Value Ref Range   Ferritin 473 (H) 24 - 336 ng/mL    Comment: Performed at [facility], [address] [place], [place] [address]  Reticulocytes     Status: Abnormal   Collection Time: 07/10/21  5:16 PM  Result Value Ref Range   Retic Ct Pct 1.7 0.4 - 3.1 %   RBC. 1.57 (L) 4.22 - 5.81 MIL/uL   Retic Count, Absolute 26.2 19.0 - 186.0 K/uL   Immature Retic Fract 17.7 (H) 2.3 - 15.9 %    Comment: Performed at [facility], [address] [place], [place] [address]  Prepare RBC (crossmatch)     Status: None   Collection Time: 07/10/21  5:16 PM  Result Value Ref Range   Order Confirmation      ORDER PROCESSED BY BLOOD BANK Performed at [facility], [address] [place], [place] [address]   BPAM RBC     Status: None   Collection Time: 07/10/21  5:16 PM  Result Value Ref Range   ISSUE DATE / TIME [id number]    Blood Product Unit Number [id number]    Unit Type and Rh 5100    Blood Product Expiration Date [id number]   POC occult blood, ED     Status: None   Collection Time: 07/10/21  5:39 PM  Result Value Ref Range   Fecal Occult Bld NEGATIVE NEGATIVE  Glucose, capillary     Status: Abnormal   Collection Time: 07/10/21  9:43 PM  Result Value Ref Range   Glucose-Capillary 113 (H) 70 - 99 mg/dL    Comment: Glucose reference range applies only to samples taken after fasting for at least 8 hours.  OVA +  PARASITE EXAM     Status: None   Collection Time: 07/10/21 10:39 PM   Specimen: Stool  Result Value Ref Range   OVA + PARASITE EXAM [place]     Comment: (NOTE) Test not performed. The required specimen for the test ordered was not received.      Audrie Lia. was notified 07/12/2021. These results were obtained using wet preparation(s) and trichrome stained smear. This test does not include testing for Cryptosporidium parvum, Cyclospora, or Microsporidia. Performed At: [facility] [place], [place] [id number] Rush Farmer MD SN:[id number]    Source of Sample STOOL     Comment: Performed at [facility], [address], [place], [place] [address]  Folate     Status: None   Collection Time: 07/11/21  3:53 AM  Result Value Ref Range   Folate 21.9 >5.9 ng/mL    Comment: Performed at [facility], [address], [place], [place] [address]  Vitamin B12     Status: Abnormal   Collection Time: 07/11/21  3:53 AM  Result Value Ref Range   Vitamin B-12 <50 (L) 180 - 914 pg/mL    Comment: Performed at [facility], [address] [place], [place] [address]  Comprehensive metabolic panel     Status: Abnormal   Collection Time: 07/11/21  3:53 AM  Result Value Ref Range   Sodium 138 135 - 145 mmol/L   Potassium 4.8 3.5 - 5.1 mmol/L   Chloride 103 98 - 111 mmol/L   CO2 27 22 - 32 mmol/L   Glucose, Bld 81 70 - 99 mg/dL    Comment: Glucose reference range applies only to samples taken after fasting for at least 8 hours.   BUN 30 (H) 8 - 23 mg/dL   Creatinine, Ser 1.52 (H) 0.61 - 1.24 mg/dL   Calcium 9.4 8.9 - 10.3 mg/dL   Total Protein 7.4 6.5 - 8.1 g/dL   Albumin 3.7 3.5 - 5.0 g/dL   AST 40 15 - 41 U/L   ALT 17 0 - 44 U/L   Alkaline Phosphatase 50 38 - 126 U/L   Total Bilirubin 1.6 (H) 0.3 - 1.2 mg/dL   GFR, Estimated 51 (L) >60 mL/min    Comment: (NOTE) Calculated using the CKD-EPI Creatinine Equation (2021)    Anion gap 8 5 - 15    Comment: Performed at  [place] [place], [place] [address]  CBC     Status: Abnormal   Collection Time: 07/11/21  3:53 AM  Result Value Ref Range   WBC 4.8 4.0 - 10.5 K/uL   RBC 1.86 (L) 4.22 - 5.81 MIL/uL   Hemoglobin 7.3 (L) 13.0 - 17.0 g/dL   HCT 21.9 (L) 39.0 - 52.0 %   MCV 117.7 (H) 80.0 - 100.0 fL   MCH 39.2 (H) 26.0 - 34.0 pg   MCHC 33.3 30.0 - 36.0 g/dL   RDW 23.2 (H) 11.5 - 15.5 %   Platelets 178 150 - 400 K/uL   nRBC 1.4 (H) 0.0 - 0.2 %    Comment: Performed at [facility], [address], [place], [place] [address]  Protime-INR     Status: None   Collection Time: 07/11/21  3:53 AM  Result Value Ref Range   Prothrombin Time 13.6 11.4 - 15.2 seconds   INR 1.0 0.8 - 1.2    Comment: (NOTE) INR goal varies based on device and disease states. Performed at [facility], [address] [place], [place] [address]   APTT     Status: None   Collection Time: 07/11/21  3:53 AM  Result Value Ref Range   aPTT 35 24 - 36 seconds    Comment: Performed at [facility], [address], [place], [place] [address]  Magnesium     Status: Abnormal   Collection Time: 07/11/21  3:53 AM  Result Value Ref Range   Magnesium 2.5 (H) 1.7 - 2.4 mg/dL    Comment: Performed at [facility], [address] [place], [place] [address]  Phosphorus     Status: None   Collection Time: 07/11/21  3:53 AM  Result Value Ref Range   Phosphorus 4.1 2.5 - 4.6 mg/dL    Comment: Performed at [facility], [address], [place], [place] [address]  HIV Antibody (routine testing w rflx)     Status: None   Collection Time: 07/11/21  3:53 AM  Result Value Ref Range   HIV Screen 4th Generation wRfx Non Reactive Non Reactive    Comment: Performed at [place] [address] [place], [place] [address]  Glucose, capillary     Status: None   Collection Time: 07/11/21  7:08 AM  Result Value Ref Range   Glucose-Capillary 78 70 - 99 mg/dL    Comment: Glucose reference range applies only to samples taken after fasting for  at least 8 hours.  Comprehensive metabolic panel     Status: Abnormal   Collection Time: 07/11/21  8:40 AM  Result Value Ref Range   Sodium 137 135 - 145 mmol/L   Potassium 4.8 3.5 - 5.1 mmol/L   Chloride 102 98 - 111 mmol/L   CO2 27 22 - 32 mmol/L   Glucose, Bld 96 70 - 99 mg/dL    Comment: Glucose reference range applies only to samples taken after fasting for at least 8 hours.   BUN 30 (H) 8 - 23 mg/dL   Creatinine, Ser 1.48 (H) 0.61 - 1.24 mg/dL   Calcium 9.4 8.9 - 10.3 mg/dL   Total Protein 8.1 6.5 - 8.1 g/dL   Albumin 4.1 3.5 - 5.0 g/dL   AST 44 (H) 15 - 41 U/L   ALT 19 0 - 44 U/L   Alkaline Phosphatase 54 38 - 126 U/L   Total Bilirubin 2.1 (H) 0.3 - 1.2 mg/dL   GFR, Estimated  52 (L) >60 mL/min    Comment: (NOTE) Calculated using the CKD-EPI Creatinine Equation (2021)    Anion gap 8 5 - 15    Comment: Performed at [facility], [address] [place], [place] [address]  Glucose, capillary     Status: Abnormal   Collection Time: 07/11/21 11:22 AM  Result Value Ref Range   Glucose-Capillary 157 (H) 70 - 99 mg/dL    Comment: Glucose reference range applies only to samples taken after fasting for at least 8 hours.  Glucose, capillary     Status: None   Collection Time: 07/11/21  4:04 PM  Result Value Ref Range   Glucose-Capillary 95 70 - 99 mg/dL    Comment: Glucose reference range applies only to samples taken after fasting for at least 8 hours.  Glucose, capillary     Status: Abnormal   Collection Time: 07/11/21  9:14 PM  Result Value Ref Range   Glucose-Capillary 109 (H) 70 - 99 mg/dL    Comment: Glucose reference range applies only to samples taken after fasting for at least 8 hours.  Magnesium     Status: None   Collection Time: 07/12/21  5:46 AM  Result Value Ref Range   Magnesium 2.3 1.7 - 2.4 mg/dL    Comment: Performed at [facility], [address], [place], [place] [address]  Phosphorus     Status: None   Collection Time: 07/12/21  5:46 AM  Result Value Ref  Range   Phosphorus 4.1 2.5 - 4.6 mg/dL    Comment: Performed at [facility], [address] [place], [place] [address]  CBC     Status: Abnormal   Collection Time: 07/12/21  5:46 AM  Result Value Ref Range   WBC 4.9 4.0 - 10.5 K/uL   RBC 1.93 (L) 4.22 - 5.81 MIL/uL   Hemoglobin 7.4 (L) 13.0 - 17.0 g/dL   HCT 22.4 (L) 39.0 - 52.0 %   MCV 116.1 (H) 80.0 - 100.0 fL   MCH 38.3 (H) 26.0 - 34.0 pg   MCHC 33.0 30.0 - 36.0 g/dL   RDW 22.6 (H) 11.5 - 15.5 %   Platelets 170 150 - 400 K/uL   nRBC 1.6 (H) 0.0 - 0.2 %    Comment: Performed at [facility], [address] [place], [place] [address]  Comprehensive metabolic panel     Status: Abnormal   Collection Time: 07/12/21  5:46 AM  Result Value Ref Range   Sodium 135 135 - 145 mmol/L   Potassium 4.9 3.5 - 5.1 mmol/L   Chloride 103 98 - 111 mmol/L   CO2 27 22 - 32 mmol/L   Glucose, Bld 104 (H) 70 - 99 mg/dL    Comment: Glucose reference range applies only to samples taken after fasting for at least 8 hours.   BUN 32 (H) 8 - 23 mg/dL   Creatinine, Ser 1.56 (H) 0.61 - 1.24 mg/dL   Calcium 9.2 8.9 - 10.3 mg/dL   Total Protein 7.4 6.5 - 8.1 g/dL   Albumin 3.8 3.5 - 5.0 g/dL   AST 60 (H) 15 - 41 U/L   ALT 18 0 - 44 U/L   Alkaline Phosphatase 51 38 - 126 U/L   Total Bilirubin 1.7 (H) 0.3 - 1.2 mg/dL   GFR, Estimated 49 (L) >60 mL/min    Comment: (NOTE) Calculated using the CKD-EPI Creatinine Equation (2021)    Anion gap 5 5 - 15    Comment: Performed at [facility], [address], [place], [place] [address]  Vitamin B12     Status: Abnormal   Collection Time: 07/12/21  5:46 AM  Result Value Ref Range   Vitamin B-12 <50 (L) 180 - 914 pg/mL    Comment: REPEATED TO VERIFY (NOTE) This assay is not validated for testing neonatal or myeloproliferative syndrome specimens for Vitamin B12 levels. Performed at [facility], [address] [place], [place] [address]   Folate     Status: None   Collection Time: 07/12/21  5:46 AM  Result  Value Ref Range   Folate 18.6 >5.9 ng/mL    Comment: Performed at [facility], [address], [place], [place] [address]  Iron and TIBC     Status: Abnormal   Collection Time: 07/12/21  5:46 AM  Result Value Ref Range   Iron 93 45 - 182 ug/dL   TIBC 242 (L) 250 - 450 ug/dL   Saturation Ratios 38 17.9 - 39.5 %   UIBC 149 ug/dL    Comment: Performed at [facility], [address], [place], [place] [address]  Ferritin     Status: Abnormal   Collection Time: 07/12/21  5:46 AM  Result Value Ref Range   Ferritin 467 (H) 24 - 336 ng/mL    Comment: Performed at [facility], [address], [place], [place] [address]  Reticulocytes     Status: Abnormal   Collection Time: 07/12/21  5:46 AM  Result Value Ref Range   Retic Ct Pct 1.4 0.4 - 3.1 %   RBC. 1.91 (L) 4.22 - 5.81 MIL/uL   Retic Count, Absolute 27.1 19.0 - 186.0 K/uL   Immature Retic Fract 17.5 (H) 2.3 - 15.9 %    Comment: Performed at [facility], [address] [place], [place] [address]  Glucose, capillary     Status: Abnormal   Collection Time: 07/12/21  7:19 AM  Result Value Ref Range   Glucose-Capillary 101 (H) 70 - 99 mg/dL    Comment: Glucose reference range applies only to samples taken after fasting for at least 8 hours.  Glucose, capillary     Status: Abnormal   Collection Time: 07/12/21 11:09 AM  Result Value Ref Range   Glucose-Capillary 147 (H) 70 - 99 mg/dL    Comment: Glucose reference range applies only to samples taken after fasting for at least 8 hours.  CBC with Differential     Status: Abnormal   Collection Time: 07/17/21 10:58 AM  Result Value Ref Range   WBC 5.6 3.4 - 10.8 x10E3/uL   RBC 1.93 (LL) 4.14 - 5.80 x10E6/uL    Comment: Ovalocytes present. Few schistocytes.    Hemoglobin 7.4 (L) 13.0 - 17.7 g/dL   Hematocrit 20.6 (L) 37.5 - 51.0 %   MCV 107 (H) 79 - 97 fL   MCH 38.3 (H) 26.6 - 33.0 pg   MCHC 35.9 (H) 31.5 - 35.7 g/dL   RDW 20.7 (H) 11.6 - 15.4 %   Platelets 151 150 - 450 x10E3/uL     Comment: Platelet count verified by examination of peripheral blood smear.   Neutrophils 34 Not Estab. %    Comment: Occasional metamyelocyte seen on scan. Hypersegmented neutrophils present.    Lymphs 55 Not Estab. %   Monocytes 1 Not Estab. %   Eos 9 Not Estab. %   Basos 0 Not Estab. %   Neutrophils Absolute 1.9 1.4 - 7.0 x10E3/uL   Lymphocytes Absolute 3.1 0.7 - 3.1 x10E3/uL  Monocytes Absolute 0.1 0.1 - 0.9 x10E3/uL   EOS (ABSOLUTE) 0.5 (H) 0.0 - 0.4 x10E3/uL   Basophils Absolute 0.0 0.0 - 0.2 x10E3/uL   Immature Granulocytes 1 Not Estab. %   Immature Grans (Abs) 0.0 0.0 - 0.1 x10E3/uL   NRBC 1 (H) 0 - 0 %   Hematology Comments: Note:     Comment: Verified by microscopic examination.  CMP14+EGFR     Status: Abnormal   Collection Time: 07/17/21 10:58 AM  Result Value Ref Range   Glucose 87 65 - 99 mg/dL   BUN 38 (H) 8 - 27 mg/dL   Creatinine, Ser 1.98 (H) 0.76 - 1.27 mg/dL   eGFR 37 (L) >59 mL/min/1.73   BUN/Creatinine Ratio 19 10 - 24   Sodium 136 134 - 144 mmol/L   Potassium 6.1 (H) 3.5 - 5.2 mmol/L   Chloride 101 96 - 106 mmol/L   CO2 20 20 - 29 mmol/L   Calcium 9.4 8.6 - 10.2 mg/dL   Total Protein 7.7 6.0 - 8.5 g/dL   Albumin 4.3 3.8 - 4.8 g/dL   Globulin, Total 3.4 1.5 - 4.5 g/dL   Albumin/Globulin Ratio 1.3 1.2 - 2.2   Bilirubin Total 1.4 (H) 0.0 - 1.2 mg/dL   Alkaline Phosphatase 63 44 - 121 IU/L   AST 60 (H) 0 - 40 IU/L   ALT 23 0 - 44 IU/L  PSA     Status: None   Collection Time: 07/17/21 10:58 AM  Result Value Ref Range   Prostate Specific Ag, Serum 1.9 0.0 - 4.0 ng/mL    Comment: Roche ECLIA methodology. According to the American Urological Association, Serum PSA should decrease and remain at undetectable levels after radical prostatectomy. The AUA defines biochemical recurrence as an initial PSA value 0.2 ng/mL or greater followed by a subsequent confirmatory PSA value 0.2 ng/mL or greater. Values obtained with different assay methods or kits cannot  be used interchangeably. Results cannot be interpreted as absolute evidence of the presence or absence of malignant disease.   Vitamin D (25 hydroxy)     Status: None   Collection Time: 07/17/21 10:58 AM  Result Value Ref Range   Vit D, 25-Hydroxy 36.9 30.0 - 100.0 ng/mL    Comment: Vitamin D deficiency has been defined by the [place] practice guideline as a level of serum 25-OH vitamin D less than 20 ng/mL (1,2). The Endocrine Society went on to further define vitamin D insufficiency as a level between 21 and 29 ng/mL (2). 1. IOM (Institute of Medicine). 2010. Dietary reference    intakes for calcium and D. [place]: The    [employer]. 2. Holick MF, Binkley [place], Bischoff-Ferrari HA, et al.    Evaluation, treatment, and prevention of vitamin D    deficiency: an Endocrine Society clinical practice    guideline. JCEM. 2011 Jul; 96(7):1911-30.   Basic metabolic panel     Status: Abnormal   Collection Time: 07/18/21  3:53 PM  Result Value Ref Range   Sodium 135 135 - 145 mmol/L   Potassium 5.2 (H) 3.5 - 5.1 mmol/L   Chloride 104 98 - 111 mmol/L   CO2 25 22 - 32 mmol/L   Glucose, Bld 174 (H) 70 - 99 mg/dL    Comment: Glucose reference range applies only to samples taken after fasting for at least 8 hours.   BUN 37 (H) 8 - 23 mg/dL   Creatinine, Ser 2.13 (H) 0.61 -  1.24 mg/dL   Calcium 8.7 (L) 8.9 - 10.3 mg/dL   GFR, Estimated 34 (L) >60 mL/min    Comment: (NOTE) Calculated using the CKD-EPI Creatinine Equation (2021)    Anion gap 6 5 - 15    Comment: Performed at [facility], [address], [place], [place] [address]  CBC with Differential     Status: Abnormal   Collection Time: 07/18/21  3:53 PM  Result Value Ref Range   WBC 6.5 4.0 - 10.5 K/uL   RBC 1.80 (L) 4.22 - 5.81 MIL/uL   Hemoglobin 7.2 (L) 13.0 - 17.0 g/dL   HCT 21.4 (L) 39.0 - 52.0 %   MCV 118.9 (H) 80.0 - 100.0 fL   MCH 40.0 (H) 26.0 - 34.0 pg   MCHC 33.6  30.0 - 36.0 g/dL   RDW 23.4 (H) 11.5 - 15.5 %   Platelets 155 150 - 400 K/uL   nRBC 1.4 (H) 0.0 - 0.2 %   Neutrophils Relative % 51 %   Neutro Abs 3.3 1.7 - 7.7 K/uL   Lymphocytes Relative 43 %   Lymphs Abs 2.8 0.7 - 4.0 K/uL   Monocytes Relative 1 %   Monocytes Absolute 0.1 0.1 - 1.0 K/uL   Eosinophils Relative 5 %   Eosinophils Absolute 0.3 0.0 - 0.5 K/uL   Basophils Relative 0 %   Basophils Absolute 0.0 0.0 - 0.1 K/uL   WBC Morphology TOXIC GRANULATION    RBC Morphology MACROCYTES SEEN    Abnormal Lymphocytes Present PRESENT    Hypersegmented Neutrophils PRESENT    Schistocytes PRESENT    Polychromasia PRESENT    Spherocytes PRESENT     Comment: Performed at [facility], [address], [place], [place] [address]  Type and screen [facility]     Status: None   Collection Time: 07/18/21  3:53 PM  Result Value Ref Range   ABO/RH(D) O POS    Antibody Screen NEG    Sample Expiration 07/21/2021,2359    Unit Number [id number]    Blood Component Type RED CELLS,LR    Unit division 00    Status of Unit ISSUED,FINAL    Transfusion Status OK TO TRANSFUSE    Crossmatch Result      Compatible Performed at [facility], [address], [place], [place] [address]   Lactate dehydrogenase     Status: Abnormal   Collection Time: 07/18/21  3:53 PM  Result Value Ref Range   LDH 2,271 (H) 98 - 192 U/L    Comment: Performed at [facility], [address], [place], [place] [address]  Reticulocytes     Status: Abnormal   Collection Time: 07/18/21  3:53 PM  Result Value Ref Range   Retic Ct Pct 1.8 0.4 - 3.1 %   RBC. 1.81 (L) 4.22 - 5.81 MIL/uL   Retic Count, Absolute 32.6 19.0 - 186.0 K/uL   Immature Retic Fract 25.4 (H) 2.3 - 15.9 %    Comment: Performed at [facility], [address], [place], [place] [address]  Prepare RBC (crossmatch)     Status: None   Collection Time: 07/18/21  3:53 PM  Result Value Ref Range   Order Confirmation      ORDER PROCESSED BY BLOOD  BANK Performed at [facility], [address] [place], [place] [address]   BPAM RBC     Status: None   Collection Time: 07/18/21  3:53 PM  Result Value Ref Range   ISSUE DATE / TIME  [id number]    Blood Product Unit Number [id number]    PRODUCT CODE X2119E17    Unit Type and Rh 5100    Blood Product Expiration Date 408144818563     RADIOGRAPHIC STUDIES: I have personally reviewed the radiological images as listed and agreed with the findings in the report. CT HEAD WO CONTRAST (5MM)  Result Date: 07/18/2021 CLINICAL DATA:  Fatigue, loss of balance EXAM: CT HEAD WITHOUT CONTRAST TECHNIQUE: Contiguous axial images were obtained from the base of the skull through the vertex without intravenous contrast. COMPARISON:  None. FINDINGS: Brain: No acute infarct or hemorrhage. Lateral ventricles and midline structures are unremarkable. No acute extra-axial fluid collections. No mass effect. Vascular: No hyperdense vessel or unexpected calcification. Skull: Normal. Negative for fracture or focal lesion. Sinuses/Orbits: No acute finding. Other: None. IMPRESSION: 1. No acute intracranial process. Electronically Signed   By: Randa Ngo M.D.   On: 07/18/2021 17:43   US Venous Img Lower  Left (DVT Study)  Result Date: 07/10/2021 CLINICAL DATA:  leg pain EXAM: LEFT LOWER EXTREMITY VENOUS DOPPLER ULTRASOUND TECHNIQUE: Gray-scale sonography with compression, as well as color and duplex ultrasound, were performed to evaluate the deep venous system(s) from the level of the common femoral vein through the popliteal and proximal calf veins. COMPARISON:  None. FINDINGS: VENOUS Normal compressibility of the common femoral, superficial femoral, and popliteal veins, as well as the visualized calf veins. Visualized portions of profunda femoral vein and great saphenous vein unremarkable. No filling defects to suggest DVT on grayscale or color Doppler imaging. Doppler waveforms show normal direction of venous flow,  normal respiratory plasticity and response to augmentation. Limited views of the contralateral common femoral vein are unremarkable. IMPRESSION: No evidence of DVT in the left lower extremity. Electronically Signed   By: Margaretha Sheffield M.D.   On: 07/10/2021 17:21    ASSESSMENT:  Macrocytic anemia: - Severe macrocytic anemia on 07/10/2021 with hemoglobin 6.9. - Required 2 units of RBC in the last 2 weeks. - Colonoscopy on 07/07/2016 with 5 mm rectosigmoid hyperplastic polyp removed. - CTAP on 03/07/2021 with spleen size normal. - Has occasional blood on tissue when constipated, most likely from external hemorrhoids.  Denies any fevers, night sweats or weight loss.  Social/family history: - He lives at home with his wife.  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  No exposure to chemicals.  He smoked 1 pack/day for 40 years.  Quit smoking in 2012. - No family history of cancers or leukemias.   PLAN:  Macrocytic anemia: - Labs on 07/12/2021 shows vitamin B12 less than 50.  Folic acid was normal.  Ferritin was 467 and percent saturation 38.  Reticulocyte percent was 1.4.  PSA was 1.9. - LDH was 2271 on 07/18/2021. - He received vitamin B12 injection on 07/17/2021. - Differential diagnosis for severe macrocytic anemia includes vitamin B12 deficiency and MDS/AML.  We will repeat B12 and check methylmalonic acid and copper levels.  We will also check TSH. - We will check for plasma cell disorders with SPEP, free light chains and immunofixation.  We will review smear. - We will also check flow cytometry.  If the tests are inconclusive, he will need bone marrow aspiration biopsy.  Working diagnosis is B12 deficiency.  He will continue B12 injection at Dr. Griffin Dakin office.  Peripheral neuropathy: - Reports neuropathy for the past few weeks.  He has a long history of  diabetes. - He was started on gabapentin 1 week ago for  neuropathy.  CKD: - He has history of CKD since 2017.  His baseline creatinine is around 1.5. - This could also be contributing to his anemia.   All questions were answered. The patient knows to call the clinic with any problems, questions or concerns.    Derek Jack, MD 07/22/21 9:45 AM  [place] [phone]   I, Thana Ates, am acting as a scribe for Dr. Derek Jack.  I, Derek Jack MD, have reviewed the above documentation for accuracy and completeness, and I agree with the above.

## 2021-07-22 ENCOUNTER — Encounter (HOSPITAL_COMMUNITY): Payer: Self-pay | Admitting: Hematology

## 2021-07-22 ENCOUNTER — Other Ambulatory Visit: Payer: Self-pay

## 2021-07-22 ENCOUNTER — Inpatient Hospital Stay (HOSPITAL_COMMUNITY): Payer: Medicare HMO | Attending: Hematology | Admitting: Hematology

## 2021-07-22 ENCOUNTER — Inpatient Hospital Stay (HOSPITAL_COMMUNITY): Payer: Medicare HMO

## 2021-07-22 ENCOUNTER — Telehealth: Payer: Self-pay

## 2021-07-22 ENCOUNTER — Ambulatory Visit (INDEPENDENT_AMBULATORY_CARE_PROVIDER_SITE_OTHER): Payer: Medicare HMO | Admitting: Nurse Practitioner

## 2021-07-22 ENCOUNTER — Encounter: Payer: Self-pay | Admitting: Nurse Practitioner

## 2021-07-22 VITALS — BP 111/63 | HR 82 | Ht 68.0 in | Wt 207.2 lb

## 2021-07-22 VITALS — BP 133/76 | HR 86 | Temp 97.9°F | Resp 18 | Wt 204.2 lb

## 2021-07-22 DIAGNOSIS — Z794 Long term (current) use of insulin: Secondary | ICD-10-CM | POA: Diagnosis not present

## 2021-07-22 DIAGNOSIS — D649 Anemia, unspecified: Secondary | ICD-10-CM | POA: Diagnosis not present

## 2021-07-22 DIAGNOSIS — I10 Essential (primary) hypertension: Secondary | ICD-10-CM | POA: Diagnosis not present

## 2021-07-22 DIAGNOSIS — E538 Deficiency of other specified B group vitamins: Secondary | ICD-10-CM | POA: Insufficient documentation

## 2021-07-22 DIAGNOSIS — N1832 Chronic kidney disease, stage 3b: Secondary | ICD-10-CM | POA: Diagnosis not present

## 2021-07-22 DIAGNOSIS — N189 Chronic kidney disease, unspecified: Secondary | ICD-10-CM | POA: Insufficient documentation

## 2021-07-22 DIAGNOSIS — E1122 Type 2 diabetes mellitus with diabetic chronic kidney disease: Secondary | ICD-10-CM | POA: Diagnosis not present

## 2021-07-22 DIAGNOSIS — D539 Nutritional anemia, unspecified: Secondary | ICD-10-CM | POA: Diagnosis not present

## 2021-07-22 DIAGNOSIS — D519 Vitamin B12 deficiency anemia, unspecified: Secondary | ICD-10-CM | POA: Diagnosis not present

## 2021-07-22 DIAGNOSIS — E782 Mixed hyperlipidemia: Secondary | ICD-10-CM | POA: Diagnosis not present

## 2021-07-22 DIAGNOSIS — G629 Polyneuropathy, unspecified: Secondary | ICD-10-CM | POA: Diagnosis not present

## 2021-07-22 LAB — CBC WITH DIFFERENTIAL/PLATELET
Abs Immature Granulocytes: 0 10*3/uL (ref 0.00–0.07)
Band Neutrophils: 2 %
Basophils Absolute: 0 10*3/uL (ref 0.0–0.1)
Basophils Relative: 0 %
Blasts: 0 %
Eosinophils Absolute: 0.6 10*3/uL — ABNORMAL HIGH (ref 0.0–0.5)
Eosinophils Relative: 8 %
HCT: 29.9 % — ABNORMAL LOW (ref 39.0–52.0)
Hemoglobin: 9.6 g/dL — ABNORMAL LOW (ref 13.0–17.0)
Lymphocytes Relative: 33 %
Lymphs Abs: 2.4 10*3/uL (ref 0.7–4.0)
MCH: 37.4 pg — ABNORMAL HIGH (ref 26.0–34.0)
MCHC: 32.1 g/dL (ref 30.0–36.0)
MCV: 116.3 fL — ABNORMAL HIGH (ref 80.0–100.0)
Metamyelocytes Relative: 0 %
Monocytes Absolute: 1.2 10*3/uL — ABNORMAL HIGH (ref 0.1–1.0)
Monocytes Relative: 16 %
Myelocytes: 0 %
Neutro Abs: 3.2 10*3/uL (ref 1.7–7.7)
Neutrophils Relative %: 41 %
Other: 0 %
Platelets: 177 10*3/uL (ref 150–400)
Promyelocytes Relative: 0 %
RBC: 2.57 MIL/uL — ABNORMAL LOW (ref 4.22–5.81)
RDW: 22.2 % — ABNORMAL HIGH (ref 11.5–15.5)
WBC: 7.4 10*3/uL (ref 4.0–10.5)
nRBC: 0 /100 WBC
nRBC: 2 % — ABNORMAL HIGH (ref 0.0–0.2)

## 2021-07-22 LAB — TSH: TSH: 2.215 u[IU]/mL (ref 0.350–4.500)

## 2021-07-22 LAB — RETICULOCYTES
Immature Retic Fract: 49.8 % — ABNORMAL HIGH (ref 2.3–15.9)
RBC.: 2.59 MIL/uL — ABNORMAL LOW (ref 4.22–5.81)
Retic Count, Absolute: 344.7 10*3/uL — ABNORMAL HIGH (ref 19.0–186.0)
Retic Ct Pct: 13.3 % — ABNORMAL HIGH (ref 0.4–3.1)

## 2021-07-22 LAB — VITAMIN B12: Vitamin B-12: 374 pg/mL (ref 180–914)

## 2021-07-22 LAB — LACTATE DEHYDROGENASE: LDH: 1140 U/L — ABNORMAL HIGH (ref 98–192)

## 2021-07-22 LAB — SAVE SMEAR(SSMR), FOR PROVIDER SLIDE REVIEW

## 2021-07-22 NOTE — Telephone Encounter (Signed)
Transition Care Management Unsuccessful Follow-up Telephone Call  Date of discharge and from where:  07/18/2021. [facility]  Diagnosis: Anemia   Attempts:  3rd Attempt  Reason for unsuccessful TCM follow-up call:  Left voice message    Tried to reach patient on 9/12 and 9/13 also, see previous outgoing telephone call note. LM on pt's voicemail and on pt's wife, Norman Peters's, voice mail. Thank you.

## 2021-07-22 NOTE — Patient Instructions (Addendum)
[place] at [facility] Discharge Instructions  You were seen and examined today by Dr. Delton Coombes. Dr. Delton Coombes is a hematologist, meaning he specializes in the management of blood disorders. Dr. Delton Coombes discussed your past medical history, family history of cancer and blood disorders, and the events that led to you being here today.  You were referred to Dr. Delton Coombes due to anemia and decreased Vitamin B12. Dr. Delton Coombes recommended additional blood work today to see if there is an identifiable cause for the anemia.  Follow-up as scheduled.   Thank you for choosing [place] at [facility] to provide your oncology and hematology care.  To afford each patient quality time with our provider, please arrive at least 15 minutes before your scheduled appointment time.   If you have a lab appointment with the [facility] please come in thru the Main Entrance and check in at the main information desk.  You need to re-schedule your appointment should you arrive 10 or more minutes late.  We strive to give you quality time with our providers, and arriving late affects you and other patients whose appointments are after yours.  Also, if you no show three or more times for appointments you may be dismissed from the clinic at the providers discretion.     Again, thank you for choosing [facility].  Our hope is that these requests will decrease the amount of time that you wait before being seen by our physicians.       _____________________________________________________________  Should you have questions after your visit to [facility], please contact our office at [phone] and follow the prompts.  Our office hours are 8:00 a.m. and 4:30 p.m. Monday - Friday.  Please note that voicemails left after 4:00 p.m. may not be returned until the following business day.  We are closed weekends and major holidays.  You do have  access to a nurse 24-7, just call the main number to the clinic [phone] and do not press any options, hold on the line and a nurse will answer the phone.    For prescription refill requests, have your pharmacy contact our office and allow 72 hours.    Due to Covid, you will need to wear a mask upon entering the hospital. If you do not have a mask, a mask will be given to you at the Main Entrance upon arrival. For doctor visits, patients may have 1 support person age 74 or older with them. For treatment visits, patients can not have anyone with them due to social distancing guidelines and our immunocompromised population.

## 2021-07-22 NOTE — Patient Instructions (Signed)
Diabetes Mellitus and Nutrition, Adult When you have diabetes, or diabetes mellitus, it is very important to have healthy eating habits because your blood sugar (glucose) levels are greatly affected by what you eat and drink. Eating healthy foods in the right amounts, at about the same times every day, can help you:  Control your blood glucose.  Lower your risk of heart disease.  Improve your blood pressure.  Reach or maintain a healthy weight. What can affect my meal plan? Every person with diabetes is different, and each person has different needs for a meal plan. Your health care provider may recommend that you work with a dietitian to make a meal plan that is best for you. Your meal plan may vary depending on factors such as:  The calories you need.  The medicines you take.  Your weight.  Your blood glucose, blood pressure, and cholesterol levels.  Your activity level.  Other health conditions you have, such as heart or kidney disease. How do carbohydrates affect me? Carbohydrates, also called carbs, affect your blood glucose level more than any other type of food. Eating carbs naturally raises the amount of glucose in your blood. Carb counting is a method for keeping track of how many carbs you eat. Counting carbs is important to keep your blood glucose at a healthy level, especially if you use insulin or take certain oral diabetes medicines. It is important to know how many carbs you can safely have in each meal. This is different for every person. Your dietitian can help you calculate how many carbs you should have at each meal and for each snack. How does alcohol affect me? Alcohol can cause a sudden decrease in blood glucose (hypoglycemia), especially if you use insulin or take certain oral diabetes medicines. Hypoglycemia can be a life-threatening condition. Symptoms of hypoglycemia, such as sleepiness, dizziness, and confusion, are similar to symptoms of having too much  alcohol.  Do not drink alcohol if: ? Your health care provider tells you not to drink. ? You are pregnant, may be pregnant, or are planning to become pregnant.  If you drink alcohol: ? Do not drink on an empty stomach. ? Limit how much you use to:  0-1 drink a day for women.  0-2 drinks a day for men. ? Be aware of how much alcohol is in your drink. In the [place] one drink equals one 12 oz bottle of beer (355 mL), one 5 oz glass of wine (148 mL), or one 1 oz glass of hard liquor (44 mL). ? Keep yourself hydrated with water, diet soda, or unsweetened iced tea.  Keep in mind that regular soda, juice, and other mixers may contain a lot of sugar and must be counted as carbs. What are tips for following this plan? Reading food labels  Start by checking the serving size on the "Nutrition Facts" label of packaged foods and drinks. The amount of calories, carbs, fats, and other nutrients listed on the label is based on one serving of the item. Many items contain more than one serving per package.  Check the total grams (g) of carbs in one serving. You can calculate the number of servings of carbs in one serving by dividing the total carbs by 15. For example, if a food has 30 g of total carbs per serving, it would be equal to 2 servings of carbs.  Check the number of grams (g) of saturated fats and trans fats in one serving. Choose foods that have   a low amount or none of these fats.  Check the number of milligrams (mg) of salt (sodium) in one serving. Most people should limit total sodium intake to less than 2,300 mg per day.  Always check the nutrition information of foods labeled as "low-fat" or "nonfat." These foods may be higher in added sugar or refined carbs and should be avoided.  Talk to your dietitian to identify your daily goals for nutrients listed on the label. Shopping  Avoid buying canned, pre-made, or processed foods. These foods tend to be high in fat, sodium, and added  sugar.  Shop around the outside edge of the grocery store. This is where you will most often find fresh fruits and vegetables, bulk grains, fresh meats, and fresh dairy. Cooking  Use low-heat cooking methods, such as baking, instead of high-heat cooking methods like deep frying.  Cook using healthy oils, such as olive, canola, or sunflower oil.  Avoid cooking with butter, cream, or high-fat meats. Meal planning  Eat meals and snacks regularly, preferably at the same times every day. Avoid going long periods of time without eating.  Eat foods that are high in fiber, such as fresh fruits, vegetables, beans, and whole grains. Talk with your dietitian about how many servings of carbs you can eat at each meal.  Eat 4-6 oz (112-168 g) of lean protein each day, such as lean meat, chicken, fish, eggs, or tofu. One ounce (oz) of lean protein is equal to: ? 1 oz (28 g) of meat, chicken, or fish. ? 1 egg. ?  cup (62 g) of tofu.  Eat some foods each day that contain healthy fats, such as avocado, nuts, seeds, and fish.   What foods should I eat? Fruits Berries. Apples. Oranges. Peaches. Apricots. Plums. Grapes. Mango. Papaya. Pomegranate. Kiwi. Cherries. Vegetables Lettuce. Spinach. Leafy greens, including kale, chard, collard greens, and mustard greens. Beets. Cauliflower. Cabbage. Broccoli. Carrots. Mannis beans. Tomatoes. Peppers. Onions. Cucumbers. Brussels sprouts. Grains Whole grains, such as whole-wheat or whole-grain bread, crackers, tortillas, cereal, and pasta. Unsweetened oatmeal. Quinoa. Brown or wild rice. Meats and other proteins Seafood. Poultry without skin. Lean cuts of poultry and beef. Tofu. Nuts. Seeds. Dairy Low-fat or fat-free dairy products such as milk, yogurt, and cheese. The items listed above may not be a complete list of foods and beverages you can eat. Contact a dietitian for more information. What foods should I avoid? Fruits Fruits canned with  syrup. Vegetables Canned vegetables. Frozen vegetables with butter or cream sauce. Grains Refined white flour and flour products such as bread, pasta, snack foods, and cereals. Avoid all processed foods. Meats and other proteins Fatty cuts of meat. Poultry with skin. Breaded or fried meats. Processed meat. Avoid saturated fats. Dairy Full-fat yogurt, cheese, or milk. Beverages Sweetened drinks, such as soda or iced tea. The items listed above may not be a complete list of foods and beverages you should avoid. Contact a dietitian for more information. Questions to ask a health care provider  Do I need to meet with a diabetes educator?  Do I need to meet with a dietitian?  What number can I call if I have questions?  When are the best times to check my blood glucose? Where to find more information:  American Diabetes Association: diabetes.org  Academy of Nutrition and Dietetics: www.eatright.org  National Institute of Diabetes and Digestive and Kidney Diseases: www.niddk.nih.gov  Association of Diabetes Care and Education Specialists: www.diabeteseducator.org Summary  It is important to have healthy eating   habits because your blood sugar (glucose) levels are greatly affected by what you eat and drink.  A healthy meal plan will help you control your blood glucose and maintain a healthy lifestyle.  Your health care provider may recommend that you work with a dietitian to make a meal plan that is best for you.  Keep in mind that carbohydrates (carbs) and alcohol have immediate effects on your blood glucose levels. It is important to count carbs and to use alcohol carefully. This information is not intended to replace advice given to you by your health care provider. Make sure you discuss any questions you have with your health care provider. Document Revised: 10/02/2019 Document Reviewed: 10/02/2019 [employer] Patient Education  2021 [employer]  

## 2021-07-22 NOTE — Progress Notes (Signed)
Endocrinology follow-up note    07/22/2021, 3:49 PM   Subjective:    Patient ID: Norman Peters, male    DOB: July 30, 1956.  Norman Peters is being seen in follow-up after he was seen in consultation for management of currently uncontrolled symptomatic diabetes requested by  Fayrene Helper, MD.   Past Medical History:  Diagnosis Date   Acute left-sided low back pain with left-sided sciatica 11/04/2020   Anxiety    Arthritis    Depression 2001   Diabetes mellitus 2010   GERD (gastroesophageal reflux disease)    Hyperlipidemia    Hypertension 2005    Past Surgical History:  Procedure Laterality Date   COLONOSCOPY N/A 07/07/2016   Procedure: COLONOSCOPY;  Surgeon: Rogene Houston, MD;  Location: [facility];  Service: Endoscopy;  Laterality: N/A;  1030   FINGER SURGERY Left 1974   fifth , trauma on job, had to replace/repair finger   HERNIA REPAIR  [id number]   x3, umbilical and bilateral hernia    Social History   Socioeconomic History   Marital status: Married    Spouse name: Not on file   Number of children: Not on file   Years of education: Not on file   Highest education level: Not on file  Occupational History   Not on file  Tobacco Use   Smoking status: Former    Packs/day: 0.50    Years: 30.00    Pack years: 15.00    Types: Cigarettes    Quit date: 01/20/2011    Years since quitting: 10.5   Smokeless tobacco: Never  Vaping Use   Vaping Use: Never used  Substance and Sexual Activity   Alcohol use: No   Drug use: No   Sexual activity: Not Currently  Other Topics Concern   Not on file  Social History Narrative   Not on file   Social Determinants of Health   Financial Resource Strain: Low Risk    Difficulty of Paying Living Expenses: Not hard at all  Food Insecurity: No Food Insecurity   Worried About Charity fundraiser in the Last Year: Never true   [place] in  the Last Year: Never true  Transportation Needs: No Transportation Needs   Lack of Transportation (Medical): No   Lack of Transportation (Non-Medical): No  Physical Activity: Insufficiently Active   Days of Exercise per Week: 5 days   Minutes of Exercise per Session: 20 min  Stress: No Stress Concern Present   Feeling of Stress : Not at all  Social Connections: Socially Integrated   Frequency of Communication with Friends and Family: More than three times a week   Frequency of Social Gatherings with Friends and Family: Once a week   Attends Religious Services: More than 4 times per year   Active Member of [employer] or Organizations: Yes   Attends Archivist Meetings: More than 4 times per year   Marital Status: Married    Family History  Problem Relation Age of Onset   Stroke Mother     Outpatient Encounter Medications as of 07/22/2021  Medication Sig  albendazole (ALBENZA) 200 MG tablet Take two tablets once today on an empty stomach, and repeat the same dose , taking two tablets one time only in two weeks (Patient taking differently: Take 400 mg by mouth See admin instructions. Take two tablets once today on an empty stomach, and repeat the same dose , taking two tablets one time only in two weeks)   albuterol (VENTOLIN HFA) 108 (90 Base) MCG/ACT inhaler Inhale 2 puffs into the lungs daily as needed for wheezing or shortness of breath.   amLODipine (NORVASC) 10 MG tablet TAKE 1 TABLET EVERY DAY (Patient taking differently: Take 10 mg by mouth every morning.)   aspirin EC 81 MG tablet Take 81 mg by mouth every morning.   gabapentin (NEURONTIN) 100 MG capsule Take 1 capsule (100 mg total) by mouth 3 (three) times daily.   LANTUS SOLOSTAR 100 UNIT/ML Solostar Pen INJECT 45 UNITS UNDER THE SKIN EVERY MORNING (DOSE INCREASE) (Patient taking differently: Inject 55 Units into the skin every evening. 2330)   montelukast (SINGULAIR) 10 MG tablet Take 1 tablet (10 mg total) by mouth  daily.   rosuvastatin (CRESTOR) 5 MG tablet TAKE 1 TABLET EVERY MONDAY, WEDNESDAY AND FRIDAY (DISCONTINUE PRAVASTATIN) (Patient taking differently: Take 5 mg by mouth every Monday, Wednesday, and Friday. TAKE 1 TABLET EVERY MONDAY, WEDNESDAY AND FRIDAY (DISCONTINUE PRAVASTATIN))   [DISCONTINUED] insulin aspart (NOVOLOG FLEXPEN) 100 UNIT/ML FlexPen Inject 5 Units into the skin 3 (three) times daily with meals.   [DISCONTINUED] Doxylamine-Phenylephrine-APAP (VICKS SINEX DAYQUIL/NYQUIL PO) Take 1 tablet by mouth daily as needed (FOR COUGH-CONGESTION IN CHEST). (Patient not taking: No sig reported)   [DISCONTINUED] sodium chloride (OCEAN) 0.65 % SOLN nasal spray Place 1 spray into both nostrils as needed for congestion. (Patient not taking: No sig reported)   No facility-administered encounter medications on file as of 07/22/2021.    ALLERGIES: Allergies  Allergen Reactions   Ibuprofen Rash   Lisinopril Cough   Metformin And Related Cough   Strawberry [Berry] Hives and Swelling    TONGUE SWELLING    VACCINATION STATUS: Immunization History  Administered Date(s) Administered   Fluad Quad(high Dose 65+) 09/02/2020, 07/17/2021   Influenza,inj,Quad PF,6+ Mos 07/14/2016, 08/25/2017, 08/30/2018, 07/02/2019   PFIZER(Purple Top)SARS-COV-2 Vaccination 01/25/2020, 02/19/2020, 09/28/2020, 05/29/2021   PNEUMOCOCCAL CONJUGATE-20 07/17/2021   Pneumococcal Polysaccharide-23 02/10/2016   Tdap 07/04/2021   Zoster Recombinat (Shingrix) 07/08/2021    Diabetes He presents for his follow-up diabetic visit. He has type 2 diabetes mellitus. Onset time: He was diagnosed at approximate age of 65 years. His disease course has been improving. Hypoglycemia symptoms include nervousness/anxiousness, sweats and tremors. Pertinent negatives for hypoglycemia include no confusion, headaches, pallor or seizures. Associated symptoms include fatigue. Pertinent negatives for diabetes include no blurred vision, no chest pain,  no polydipsia, no polyphagia, no polyuria, no weakness and no weight loss. There are no hypoglycemic complications. Symptoms are improving. Diabetic complications include nephropathy and peripheral neuropathy. Risk factors for coronary artery disease include diabetes mellitus, dyslipidemia, obesity, hypertension, male sex, tobacco exposure and sedentary lifestyle. Current diabetic treatment includes insulin injections. He is compliant with treatment most of the time. His weight is decreasing steadily. He is following a generally unhealthy diet. When asked about meal planning, he reported none. He has had a previous visit with a dietitian. He rarely participates in exercise. His home blood glucose trend is decreasing steadily. His breakfast blood glucose range is generally 90-110 mg/dl. His bedtime blood glucose range is generally 140-180 mg/dl. (He presents today  with his meter and logs showing drastically improved glycemic profile overall.  He was not due for another A1c today.  Analysis of his meter shows 7-day average of 129, 14-day average of 135, 30-day average of 145, 90-day average of 178.  He did have one episode of hypoglycemia today while he was at the hospital for work up for anemia, but this is because he did not eat breakfast before going.) An ACE inhibitor/angiotensin II receptor blocker is not being taken. He does not see a podiatrist.Eye exam is current.  Hyperlipidemia This is a chronic problem. The current episode started more than 1 year ago. The problem is uncontrolled. Recent lipid tests were reviewed and are variable. Exacerbating diseases include chronic renal disease, diabetes and obesity. Factors aggravating his hyperlipidemia include fatty foods. Pertinent negatives include no chest pain, myalgias or shortness of breath. Current antihyperlipidemic treatment includes statins. The current treatment provides mild improvement of lipids. Compliance problems include adherence to exercise and  adherence to diet.  Risk factors for coronary artery disease include diabetes mellitus, dyslipidemia, male sex, obesity, hypertension and a sedentary lifestyle.  Hypertension This is a chronic problem. The current episode started more than 1 year ago. The problem is unchanged. The problem is controlled. Associated symptoms include sweats. Pertinent negatives include no blurred vision, chest pain, headaches, neck pain, palpitations or shortness of breath. There are no associated agents to hypertension. Risk factors for coronary artery disease include diabetes mellitus, male gender, smoking/tobacco exposure, sedentary lifestyle, obesity and dyslipidemia. Past treatments include calcium channel blockers and diuretics. The current treatment provides mild improvement. There are no compliance problems.  Hypertensive end-organ damage includes kidney disease. Identifiable causes of hypertension include chronic renal disease.   Review of systems  Constitutional: + Minimally fluctuating body weight,  current Body mass index is 31.5 kg/m. , + fatigue, no subjective hyperthermia, no subjective hypothermia Eyes: no blurry vision, no xerophthalmia ENT: no sore throat, no nodules palpated in throat, no dysphagia/odynophagia, no hoarseness Cardiovascular: no chest pain, no shortness of breath, no palpitations, no leg swelling Respiratory: no cough, no shortness of breath Gastrointestinal: no nausea/vomiting/diarrhea Musculoskeletal: no muscle/joint aches Skin: no rashes, no hyperemia, has dark ring around his left foot Neurological: no tremors, + numbness/tingling to LLE, no dizziness Psychiatric: no depression, no anxiety   Objective:    BP 111/63   Pulse 82   Ht '5\' 8"'$  (1.727 m)   Wt 207 lb 3.2 oz (94 kg)   BMI 31.50 kg/m   Wt Readings from Last 3 Encounters:  07/22/21 207 lb 3.2 oz (94 kg)  07/22/21 204 lb 3.2 oz (92.6 kg)  07/18/21 205 lb 9 oz (93.2 kg)    BP Readings from Last 3 Encounters:   07/22/21 111/63  07/22/21 133/76  07/18/21 132/78     Physical Exam- Limited  Constitutional:  Body mass index is 31.5 kg/m. , not in acute distress, normal state of mind Eyes:  EOMI, no exophthalmos Neck: Supple Cardiovascular: RRR, no murmurs, rubs, or gallops, no edema Respiratory: Adequate breathing efforts, no crackles, rales, rhonchi, or wheezing Musculoskeletal: no gross deformities, strength intact in all four extremities, no gross restriction of joint movements Skin:  no rashes, no hyperemia Neurological: no tremor with outstretched hands     CMP     Component Value Date/Time   NA 135 07/18/2021 1553   NA 136 07/17/2021 1058   K 5.2 (H) 07/18/2021 1553   CL 104 07/18/2021 1553   CO2 25 07/18/2021 1553  GLUCOSE 174 (H) 07/18/2021 1553   BUN 37 (H) 07/18/2021 1553   BUN 38 (H) 07/17/2021 1058   CREATININE 2.13 (H) 07/18/2021 1553   CREATININE 1.47 (H) 08/25/2020 0927   CALCIUM 8.7 (L) 07/18/2021 1553   PROT 7.7 07/17/2021 1058   ALBUMIN 4.3 07/17/2021 1058   AST 60 (H) 07/17/2021 1058   ALT 23 07/17/2021 1058   ALKPHOS 63 07/17/2021 1058   BILITOT 1.4 (H) 07/17/2021 1058   GFRNONAA 34 (L) 07/18/2021 1553   GFRNONAA 50 (L) 08/25/2020 0927   GFRAA 61 12/10/2020 0933   GFRAA 58 (L) 08/25/2020 0927     Diabetic Labs (most recent): Lab Results  Component Value Date   HGBA1C 11.7 (A) 06/17/2021   HGBA1C 8.5 (A) 03/16/2021   HGBA1C 9.9 (H) 12/12/2020     Lipid Panel     Component Value Date/Time   CHOL 132 06/10/2021 0844   TRIG 158 (H) 06/10/2021 0844   HDL 28 (L) 06/10/2021 0844   CHOLHDL 3.2 12/10/2020 0933   CHOLHDL 3.4 08/25/2020 0927   VLDL 39 (H) 05/14/2017 0831   LDLCALC 76 06/10/2021 0844   LDLCALC 55 08/25/2020 0927   LABVLDL 28 06/10/2021 0844      Lab Results  Component Value Date   TSH 2.215 07/22/2021   TSH 2.100 06/10/2021   TSH 2.79 08/25/2020   TSH 1.96 08/24/2019   TSH 1.34 01/31/2018   TSH 1.61 02/10/2016   FREET4  1.14 06/10/2021   FREET4 1.0 08/25/2020     Assessment & Plan:   1) Type 2 diabetes mellitus with stage 3a chronic kidney disease, with long-term current use of insulin ([place])  - Verden Donini has currently uncontrolled symptomatic type 2 DM since 65 years of age.  He presents today with his meter and logs showing drastically improved glycemic profile overall.  He was not due for another A1c today.  Analysis of his meter shows 7-day average of 129, 14-day average of 135, 30-day average of 145, 90-day average of 178.  He did have one episode of hypoglycemia today while he was at the hospital for work up for anemia, but this is because he did not eat breakfast before going.   Recent labs reviewed.  - I had a long discussion with him about the progressive nature of diabetes and the pathology behind its complications. -his diabetes is complicated by CKD and he remains at a high risk for more acute and chronic complications which include CAD, CVA, CKD, retinopathy, and neuropathy. These are all discussed in detail with him.  - Nutritional counseling repeated at each appointment due to patients tendency to fall back in to old habits.  - The patient admits there is a room for improvement in their diet and drink choices. -  Suggestion is made for the patient to avoid simple carbohydrates from their diet including Cakes, Sweet Desserts / Pastries, Ice Cream, Soda (diet and regular), Sweet Tea, Candies, Chips, Cookies, Sweet Pastries, Store Bought Juices, Alcohol in Excess of 1-2 drinks a day, Artificial Sweeteners, Coffee Creamer, and "Sugar-free" Products. This will help patient to have stable blood glucose profile and potentially avoid unintended weight gain.   - I encouraged the patient to switch to unprocessed or minimally processed complex starch and increased protein intake (animal or plant source), fruits, and vegetables.   - Patient is advised to stick to a routine mealtimes to eat 3 meals a day  and avoid unnecessary snacks (to snack only to correct hypoglycemia).  -  I have approached him with the following individualized plan to manage his diabetes and patient agrees:   - he will continue to benefit from basal insulin.    -Given his stable at goal glycemic profile, he is advised to continue his Lantus 55 units SQ nightly.  He is to avoid short acting insulin at this time.    -He is encouraged to continue monitoring blood glucose twice daily, before breakfast and before bed, and to call the clinic if he has readings less than 70 or greater than 200 for 3 tests in a row.  - he is warned not to take insulin without proper monitoring per orders.  -He reports that he had developed unexplained cough while taking Glipizide and Metformin.  After he finished his Glipizide and Metformin, reportedly the cough has improved.  He is advised to stay off of Glipizide and Metformin for now.   - he will be considered for incretin therapy as appropriate next visit.  - Specific targets for  A1c;  LDL, HDL,  and Triglycerides were discussed with the patient.  2) Blood Pressure /Hypertension:  His blood pressure is controlled to target.  he is advised to continue his current medications including Amlodipine 10 mg p.o. daily with breakfast and Spironolactone 50 mg po daily.  3) Lipids/Hyperlipidemia:   His most recent lipid panel from 06/10/21 shows controlled LDL of 76 and elevated triglycerides of 158 (improving).  He is advised to continue Crestor 5 mg po every other day at bedtime and Zetia 10 mg po daily.  Side effects and precautions discussed with him.   4)  Weight/Diet:  His Body mass index is 31.5 kg/m.  -   clearly complicating his diabetes care.   he is a candidate for weight loss. I discussed with him the fact that loss of 5 - 10% of his  current body weight will have the most impact on his diabetes management.  Exercise, and detailed carbohydrates information provided  -  detailed on  discharge instructions.  5) Chronic Care/Health Maintenance: -he is on a Statin medications and is encouraged to initiate and continue to follow up with Ophthalmology, Dentist, Podiatrist (advised him to get established with one soon to evaluate his discoloration to his left foot) at least yearly or according to recommendations, and advised to stay away from smoking. I have recommended yearly flu vaccine and pneumonia vaccine at least every 5 years; moderate intensity exercise for up to 150 minutes weekly; and  sleep for at least 7 hours a day.  - he is advised to maintain close follow up with Fayrene Helper, MD for primary care needs, as well as his other providers for optimal and coordinated care.       I spent 40 minutes in the care of the patient today including review of labs from [place], Lipids, Thyroid Function, Hematology (current and previous including abstractions from other facilities); face-to-face time discussing  his blood glucose readings/logs, discussing hypoglycemia and hyperglycemia episodes and symptoms, medications doses, his options of short and long term treatment based on the latest standards of care / guidelines;  discussion about incorporating lifestyle medicine;  and documenting the encounter.    Please refer to Patient Instructions for Blood Glucose Monitoring and Insulin/Medications Dosing Guide"  in media tab for additional information. Please  also refer to " Patient Self Inventory" in the Media  tab for reviewed elements of pertinent patient history.  [name] participated in the discussions, expressed understanding, and voiced agreement  with the above plans.  All questions were answered to his satisfaction. he is encouraged to contact clinic should he have any questions or concerns prior to his return visit.   Follow up plan: - Return in about 3 months (around 10/21/2021) for Diabetes F/U with A1c in office, No previsit labs, Bring meter and logs.  Rayetta Pigg, [facility] [address] [place], [place] [address] Phone: [phone] Fax: [phone]  07/22/2021, 3:49 PM

## 2021-07-23 LAB — SURGICAL PATHOLOGY

## 2021-07-23 LAB — KAPPA/LAMBDA LIGHT CHAINS
Kappa free light chain: 112.7 mg/L — ABNORMAL HIGH (ref 3.3–19.4)
Kappa, lambda light chain ratio: 1.89 — ABNORMAL HIGH (ref 0.26–1.65)
Lambda free light chains: 59.7 mg/L — ABNORMAL HIGH (ref 5.7–26.3)

## 2021-07-24 LAB — COPPER, SERUM: Copper: 164 ug/dL — ABNORMAL HIGH (ref 69–132)

## 2021-07-25 LAB — IMMUNOFIXATION ELECTROPHORESIS
IgA: 280 mg/dL (ref 61–437)
IgG (Immunoglobin G), Serum: 1953 mg/dL — ABNORMAL HIGH (ref 603–1613)
IgM (Immunoglobulin M), Srm: 92 mg/dL (ref 20–172)
Total Protein ELP: 7.6 g/dL (ref 6.0–8.5)

## 2021-07-30 ENCOUNTER — Other Ambulatory Visit: Payer: Self-pay

## 2021-07-30 ENCOUNTER — Inpatient Hospital Stay (HOSPITAL_BASED_OUTPATIENT_CLINIC_OR_DEPARTMENT_OTHER): Payer: Medicare HMO | Admitting: Hematology

## 2021-07-30 VITALS — BP 130/60 | HR 84 | Temp 97.8°F | Resp 20 | Wt 212.4 lb

## 2021-07-30 DIAGNOSIS — E538 Deficiency of other specified B group vitamins: Secondary | ICD-10-CM | POA: Diagnosis not present

## 2021-07-30 DIAGNOSIS — D539 Nutritional anemia, unspecified: Secondary | ICD-10-CM

## 2021-07-30 DIAGNOSIS — G629 Polyneuropathy, unspecified: Secondary | ICD-10-CM | POA: Diagnosis not present

## 2021-07-30 DIAGNOSIS — N189 Chronic kidney disease, unspecified: Secondary | ICD-10-CM | POA: Diagnosis not present

## 2021-07-30 DIAGNOSIS — D649 Anemia, unspecified: Secondary | ICD-10-CM | POA: Diagnosis not present

## 2021-07-30 LAB — PROTEIN ELECTROPHORESIS, SERUM
A/G Ratio: 1.1 (ref 0.7–1.7)
Albumin ELP: 4.1 g/dL (ref 2.9–4.4)
Alpha-1-Globulin: 0.3 g/dL (ref 0.0–0.4)
Alpha-2-Globulin: 0.6 g/dL (ref 0.4–1.0)
Beta Globulin: 1 g/dL (ref 0.7–1.3)
Gamma Globulin: 1.9 g/dL — ABNORMAL HIGH (ref 0.4–1.8)
Globulin, Total: 3.7 g/dL (ref 2.2–3.9)
Total Protein ELP: 7.8 g/dL (ref 6.0–8.5)

## 2021-07-30 MED ORDER — CYANOCOBALAMIN 1000 MCG/ML IJ SOLN
1000.0000 ug | Freq: Once | INTRAMUSCULAR | Status: AC
  Administered 2021-07-30: 1000 ug via INTRAMUSCULAR
  Filled 2021-07-30: qty 1

## 2021-07-30 NOTE — Patient Instructions (Addendum)
[place] at [facility] Discharge Instructions  You were seen today by Dr. Delton Coombes. He went over your recent results. You will be scheduled to receive a vitamin B-12 injection every week for the next 4 weeks. After those 4 weeks, you will receive the injection once a month. Dr. Delton Coombes will see you back in 1 month for labs and follow up.   Thank you for choosing [place] at [facility] to provide your oncology and hematology care.  To afford each patient quality time with our provider, please arrive at least 15 minutes before your scheduled appointment time.   If you have a lab appointment with the [facility] please come in thru the Main Entrance and check in at the main information desk  You need to re-schedule your appointment should you arrive 10 or more minutes late.  We strive to give you quality time with our providers, and arriving late affects you and other patients whose appointments are after yours.  Also, if you no show three or more times for appointments you may be dismissed from the clinic at the providers discretion.     Again, thank you for choosing [facility].  Our hope is that these requests will decrease the amount of time that you wait before being seen by our physicians.       _____________________________________________________________  Should you have questions after your visit to [facility], please contact our office at [phone] between the hours of 8:00 a.m. and 4:30 p.m.  Voicemails left after 4:00 p.m. will not be returned until the following business day.  For prescription refill requests, have your pharmacy contact our office and allow 72 hours.    Cancer Center Support Programs:   > Cancer Support Group  2nd Tuesday of the month 1pm-2pm, Journey Room

## 2021-07-30 NOTE — Progress Notes (Signed)
Norman Peters, [place] [address]   CLINIC:  Medical Oncology/Hematology  PCP:  Fayrene Helper, MD [address], [address] / [place] [address]  [phone]  REASON FOR VISIT:  Follow-up for anemia and vitamin B-12 deficiency  PRIOR THERAPY: none  CURRENT THERAPY: under work-up  INTERVAL HISTORY:  [name], a 65 y.o. male, returns for routine follow-up for his anemia and vitamin B-12 deficiency. Ramez was last seen on 07/22/2021.  Today he reports feeling well. His energy levels are fair.  REVIEW OF SYSTEMS:  Review of Systems  Constitutional:  Negative for appetite change and fatigue (60%).  HENT:   Positive for trouble swallowing (teeth).   Respiratory:  Positive for shortness of breath.   Gastrointestinal:  Positive for abdominal pain.  Musculoskeletal:  Positive for back pain.  Neurological:  Positive for dizziness and numbness.  Psychiatric/Behavioral:  Positive for depression and sleep disturbance. The patient is nervous/anxious.   All other systems reviewed and are negative.  PAST MEDICAL/SURGICAL HISTORY:  Past Medical History:  Diagnosis Date   Acute left-sided low back pain with left-sided sciatica 11/04/2020   Anxiety    Arthritis    Depression 2001   Diabetes mellitus 2010   GERD (gastroesophageal reflux disease)    Hyperlipidemia    Hypertension 2005   Past Surgical History:  Procedure Laterality Date   COLONOSCOPY N/A 07/07/2016   Procedure: COLONOSCOPY;  Surgeon: Rogene Houston, MD;  Location: [facility];  Service: Endoscopy;  Laterality: N/A;  1030   FINGER SURGERY Left 1974   fifth , trauma on job, had to replace/repair finger   HERNIA REPAIR  0973   x3, umbilical and bilateral hernia    SOCIAL HISTORY:  Social History   Socioeconomic History   Marital status: Married    Spouse name: Not on file   Number of children: Not on file   Years of education: Not on file   Highest education  level: Not on file  Occupational History   Not on file  Tobacco Use   Smoking status: Former    Packs/day: 0.50    Years: 30.00    Pack years: 15.00    Types: Cigarettes    Quit date: 01/20/2011    Years since quitting: 10.5   Smokeless tobacco: Never  Vaping Use   Vaping Use: Never used  Substance and Sexual Activity   Alcohol use: No   Drug use: No   Sexual activity: Not Currently  Other Topics Concern   Not on file  Social History Narrative   Not on file   Social Determinants of Health   Financial Resource Strain: Low Risk    Difficulty of Paying Living Expenses: Not hard at all  Food Insecurity: No Food Insecurity   Worried About Charity fundraiser in the Last Year: Never true   [place] in the Last Year: Never true  Transportation Needs: No Transportation Needs   Lack of Transportation (Medical): No   Lack of Transportation (Non-Medical): No  Physical Activity: Insufficiently Active   Days of Exercise per Week: 5 days   Minutes of Exercise per Session: 20 min  Stress: No Stress Concern Present   Feeling of Stress : Not at all  Social Connections: Socially Integrated   Frequency of Communication with Friends and Family: More than three times a week   Frequency of Social Gatherings with Friends and Family: Once a week  Attends Religious Services: More than 4 times per year   Active Member of Clubs or Organizations: Yes   Attends Music therapist: More than 4 times per year   Marital Status: Married  Human resources officer Violence: Not At Risk   Fear of Current or Ex-Partner: No   Emotionally Abused: No   Physically Abused: No   Sexually Abused: No    FAMILY HISTORY:  Family History  Problem Relation Age of Onset   Stroke Mother     CURRENT MEDICATIONS:  Current Outpatient Medications  Medication Sig Dispense Refill   albendazole (ALBENZA) 200 MG tablet Take two tablets once today on an empty stomach, and repeat the same dose , taking two  tablets one time only in two weeks (Patient taking differently: Take 400 mg by mouth See admin instructions. Take two tablets once today on an empty stomach, and repeat the same dose , taking two tablets one time only in two weeks) 4 tablet 0   albuterol (VENTOLIN HFA) 108 (90 Base) MCG/ACT inhaler Inhale 2 puffs into the lungs daily as needed for wheezing or shortness of breath. 1 each 1   amLODipine (NORVASC) 10 MG tablet TAKE 1 TABLET EVERY DAY (Patient taking differently: Take 10 mg by mouth every morning.) 90 tablet 1   aspirin EC 81 MG tablet Take 81 mg by mouth every morning.     gabapentin (NEURONTIN) 100 MG capsule Take 1 capsule (100 mg total) by mouth 3 (three) times daily. 90 capsule 3   LANTUS SOLOSTAR 100 UNIT/ML Solostar Pen INJECT 45 UNITS UNDER THE SKIN EVERY MORNING (DOSE INCREASE) (Patient taking differently: Inject 55 Units into the skin every evening. 2330) 45 mL 3   montelukast (SINGULAIR) 10 MG tablet Take 1 tablet (10 mg total) by mouth daily. 30 tablet 0   rosuvastatin (CRESTOR) 5 MG tablet TAKE 1 TABLET EVERY MONDAY, WEDNESDAY AND FRIDAY (DISCONTINUE PRAVASTATIN) (Patient taking differently: Take 5 mg by mouth every Monday, Wednesday, and Friday. TAKE 1 TABLET EVERY MONDAY, WEDNESDAY AND FRIDAY (DISCONTINUE PRAVASTATIN)) 36 tablet 3   No current facility-administered medications for this visit.    ALLERGIES:  Allergies  Allergen Reactions   Ibuprofen Rash   Lisinopril Cough   Metformin And Related Cough   Strawberry [Berry] Hives and Swelling    TONGUE SWELLING    PHYSICAL EXAM:  Performance status (ECOG): 1 - Symptomatic but completely ambulatory  There were no vitals filed for this visit. Wt Readings from Last 3 Encounters:  07/22/21 207 lb 3.2 oz (94 kg)  07/22/21 204 lb 3.2 oz (92.6 kg)  07/18/21 205 lb 9 oz (93.2 kg)   Physical Exam Vitals reviewed.  Constitutional:      Appearance: Normal appearance. He is obese.  Cardiovascular:     Rate and  Rhythm: Normal rate and regular rhythm.     Pulses: Normal pulses.     Heart sounds: Normal heart sounds.  Pulmonary:     Effort: Pulmonary effort is normal.     Breath sounds: Normal breath sounds.  Neurological:     General: No focal deficit present.     Mental Status: He is alert and oriented to person, place, and time.  Psychiatric:        Mood and Affect: Mood normal.        Behavior: Behavior normal.    LABORATORY DATA:  I have reviewed the labs as listed.  CBC Latest Ref Rng & Units 07/22/2021 07/18/2021 07/17/2021  WBC 4.0 -  10.5 K/uL 7.4 6.5 5.6  Hemoglobin 13.0 - 17.0 g/dL 9.6(L) 7.2(L) 7.4(L)  Hematocrit 39.0 - 52.0 % 29.9(L) 21.4(L) 20.6(L)  Platelets 150 - 400 K/uL 177 155 151   CMP Latest Ref Rng & Units 07/18/2021 07/17/2021 07/12/2021  Glucose 70 - 99 mg/dL 174(H) 87 104(H)  BUN 8 - 23 mg/dL 37(H) 38(H) 32(H)  Creatinine 0.61 - 1.24 mg/dL 2.13(H) 1.98(H) 1.56(H)  Sodium 135 - 145 mmol/L 135 136 135  Potassium 3.5 - 5.1 mmol/L 5.2(H) 6.1(H) 4.9  Chloride 98 - 111 mmol/L 104 101 103  CO2 22 - 32 mmol/L 25 20 27   Calcium 8.9 - 10.3 mg/dL 8.7(L) 9.4 9.2  Total Protein 6.0 - 8.5 g/dL - 7.7 7.4  Total Bilirubin 0.0 - 1.2 mg/dL - 1.4(H) 1.7(H)  Alkaline Phos 44 - 121 IU/L - 63 51  AST 0 - 40 IU/L - 60(H) 60(H)  ALT 0 - 44 IU/L - 23 18      Component Value Date/Time   RBC 2.59 (L) 07/22/2021 0952   RBC 2.57 (L) 07/22/2021 0952   MCV 116.3 (H) 07/22/2021 0952   MCV 107 (H) 07/17/2021 1058   MCH 37.4 (H) 07/22/2021 0952   MCHC 32.1 07/22/2021 0952   RDW 22.2 (H) 07/22/2021 0952   RDW 20.7 (H) 07/17/2021 1058   LYMPHSABS 2.4 07/22/2021 0952   LYMPHSABS 3.1 07/17/2021 1058   MONOABS 1.2 (H) 07/22/2021 0952   EOSABS 0.6 (H) 07/22/2021 0952   EOSABS 0.5 (H) 07/17/2021 1058   BASOSABS 0.0 07/22/2021 0952   BASOSABS 0.0 07/17/2021 1058    DIAGNOSTIC IMAGING:  I have independently reviewed the scans and discussed with the patient. CT HEAD WO CONTRAST (5MM)  Result  Date: 07/18/2021 CLINICAL DATA:  Fatigue, loss of balance EXAM: CT HEAD WITHOUT CONTRAST TECHNIQUE: Contiguous axial images were obtained from the base of the skull through the vertex without intravenous contrast. COMPARISON:  None. FINDINGS: Brain: No acute infarct or hemorrhage. Lateral ventricles and midline structures are unremarkable. No acute extra-axial fluid collections. No mass effect. Vascular: No hyperdense vessel or unexpected calcification. Skull: Normal. Negative for fracture or focal lesion. Sinuses/Orbits: No acute finding. Other: None. IMPRESSION: 1. No acute intracranial process. Electronically Signed   By: Randa Ngo M.D.   On: 07/18/2021 17:43   US Venous Img Lower  Left (DVT Study)  Result Date: 07/10/2021 CLINICAL DATA:  leg pain EXAM: LEFT LOWER EXTREMITY VENOUS DOPPLER ULTRASOUND TECHNIQUE: Gray-scale sonography with compression, as well as color and duplex ultrasound, were performed to evaluate the deep venous system(s) from the level of the common femoral vein through the popliteal and proximal calf veins. COMPARISON:  None. FINDINGS: VENOUS Normal compressibility of the common femoral, superficial femoral, and popliteal veins, as well as the visualized calf veins. Visualized portions of profunda femoral vein and great saphenous vein unremarkable. No filling defects to suggest DVT on grayscale or color Doppler imaging. Doppler waveforms show normal direction of venous flow, normal respiratory plasticity and response to augmentation. Limited views of the contralateral common femoral vein are unremarkable. IMPRESSION: No evidence of DVT in the left lower extremity. Electronically Signed   By: Margaretha Sheffield M.D.   On: 07/10/2021 17:21     ASSESSMENT:  Macrocytic anemia: - Severe macrocytic anemia on 07/10/2021 with hemoglobin 6.9. - Required 2 units of RBC in the last 2 weeks. - Colonoscopy on 07/07/2016 with 5 mm rectosigmoid hyperplastic polyp removed. - CTAP on 03/07/2021 with  spleen size normal. - Has  occasional blood on tissue when constipated, most likely from external hemorrhoids.  Denies any fevers, night sweats or weight loss.   Social/family history: - He lives at home with his wife.  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  No exposure to chemicals.  He smoked 1 pack/day for 40 years.  Quit smoking in 2012. - No family history of cancers or leukemias.   PLAN:  Severe macrocytic anemia secondary to vitamin B12 deficiency: - We reviewed labs from 07/22/2021.  Flow cytometry is negative. - Hemoglobin improved to 9.6.  LDH also trended down from 2271 to 1140 after 1 injection of B12. - SPEP is negative.  Immunofixation was polyclonal.  Free light chain ratio is slightly high at 1.89, kappa light chains 112 and lambda light chains 59.  Reticulocyte count was 13%. - Patient is not a vegan.  We will do work-up for pernicious anemia with intrinsic factor antibodies and antiparietal cell antibodies. - Recommend giving vitamin B12 injection weekly x4 followed by monthly. - We will check CBC, LDH and reticulocyte count next week. - RTC 4 weeks for follow-up with repeat labs.   Peripheral neuropathy: - He has neuropathy in the past few weeks with numbness in the fingertips and toes.  Numbness got worse yesterday.  He also has longstanding history of diabetes. - Continue gabapentin 100 mg 3 times daily.   CKD: - He has history of CKD since 2017.  His baseline creatinine is about 1.5. - CKD could also be contributing to anemia.  Orders placed this encounter:  No orders of the defined types were placed in this encounter.    Derek Jack, MD [place] [phone]   I, Thana Ates, am acting as a scribe for Dr. Derek Jack.  I, Derek Jack MD, have reviewed the above documentation for accuracy and completeness, and I agree with the above.

## 2021-08-01 LAB — OVA AND PARASITE EXAMINATION

## 2021-08-05 LAB — METHYLMALONIC ACID, SERUM: Methylmalonic Acid, Quantitative: 9638 nmol/L — ABNORMAL HIGH (ref 0–378)

## 2021-08-06 ENCOUNTER — Other Ambulatory Visit: Payer: Self-pay

## 2021-08-06 ENCOUNTER — Inpatient Hospital Stay (HOSPITAL_COMMUNITY): Payer: Medicare HMO

## 2021-08-06 ENCOUNTER — Encounter (HOSPITAL_COMMUNITY): Payer: Self-pay

## 2021-08-06 ENCOUNTER — Ambulatory Visit: Payer: Medicare HMO | Admitting: Family Medicine

## 2021-08-06 VITALS — BP 141/76 | HR 73 | Temp 97.0°F | Resp 18

## 2021-08-06 DIAGNOSIS — E538 Deficiency of other specified B group vitamins: Secondary | ICD-10-CM

## 2021-08-06 DIAGNOSIS — D539 Nutritional anemia, unspecified: Secondary | ICD-10-CM

## 2021-08-06 DIAGNOSIS — G629 Polyneuropathy, unspecified: Secondary | ICD-10-CM | POA: Diagnosis not present

## 2021-08-06 DIAGNOSIS — N189 Chronic kidney disease, unspecified: Secondary | ICD-10-CM | POA: Diagnosis not present

## 2021-08-06 DIAGNOSIS — D649 Anemia, unspecified: Secondary | ICD-10-CM | POA: Diagnosis not present

## 2021-08-06 LAB — CBC WITH DIFFERENTIAL/PLATELET
Abs Immature Granulocytes: 0.03 10*3/uL (ref 0.00–0.07)
Basophils Absolute: 0.1 10*3/uL (ref 0.0–0.1)
Basophils Relative: 1 %
Eosinophils Absolute: 0.6 10*3/uL — ABNORMAL HIGH (ref 0.0–0.5)
Eosinophils Relative: 5 %
HCT: 34.8 % — ABNORMAL LOW (ref 39.0–52.0)
Hemoglobin: 10.9 g/dL — ABNORMAL LOW (ref 13.0–17.0)
Immature Granulocytes: 0 %
Lymphocytes Relative: 23 %
Lymphs Abs: 2.6 10*3/uL (ref 0.7–4.0)
MCH: 34.2 pg — ABNORMAL HIGH (ref 26.0–34.0)
MCHC: 31.3 g/dL (ref 30.0–36.0)
MCV: 109.1 fL — ABNORMAL HIGH (ref 80.0–100.0)
Monocytes Absolute: 1.2 10*3/uL — ABNORMAL HIGH (ref 0.1–1.0)
Monocytes Relative: 11 %
Neutro Abs: 6.7 10*3/uL (ref 1.7–7.7)
Neutrophils Relative %: 60 %
Platelets: 384 10*3/uL (ref 150–400)
RBC: 3.19 MIL/uL — ABNORMAL LOW (ref 4.22–5.81)
RDW: 17.3 % — ABNORMAL HIGH (ref 11.5–15.5)
WBC: 11.3 10*3/uL — ABNORMAL HIGH (ref 4.0–10.5)
nRBC: 0 % (ref 0.0–0.2)

## 2021-08-06 LAB — RETICULOCYTES
Immature Retic Fract: 27.1 % — ABNORMAL HIGH (ref 2.3–15.9)
RBC.: 3.26 MIL/uL — ABNORMAL LOW (ref 4.22–5.81)
Retic Count, Absolute: 206.4 10*3/uL — ABNORMAL HIGH (ref 19.0–186.0)
Retic Ct Pct: 6.3 % — ABNORMAL HIGH (ref 0.4–3.1)

## 2021-08-06 LAB — LACTATE DEHYDROGENASE: LDH: 198 U/L — ABNORMAL HIGH (ref 98–192)

## 2021-08-06 MED ORDER — CYANOCOBALAMIN 1000 MCG/ML IJ SOLN
1000.0000 ug | Freq: Once | INTRAMUSCULAR | Status: AC
  Administered 2021-08-06: 1000 ug via INTRAMUSCULAR
  Filled 2021-08-06: qty 1

## 2021-08-06 NOTE — Progress Notes (Signed)
Norman Peters presents today for injection per the provider's orders.  B12  administration without incident; injection site WNL; see MAR for injection details.  Patient tolerated procedure well and without incident.  No questions or complaints noted at this time.   Discharged from clinic ambulatory in stable condition. Alert and oriented x 3. F/U with [facility] as scheduled.

## 2021-08-06 NOTE — Patient Instructions (Signed)
[facility]  Discharge Instructions: Thank you for choosing [facility] to provide your oncology and hematology care.  If you have a lab appointment with the [facility], please come in thru the Main Entrance and check in at the main information desk.  Wear comfortable clothing and clothing appropriate for easy access to any Portacath or PICC line.   We strive to give you quality time with your provider. You may need to reschedule your appointment if you arrive late (15 or more minutes).  Arriving late affects you and other patients whose appointments are after yours.  Also, if you miss three or more appointments without notifying the office, you may be dismissed from the clinic at the provider's discretion.      For prescription refill requests, have your pharmacy contact our office and allow 72 hours for refills to be completed.    Today you received a B12 injection.       To help prevent nausea and vomiting after your treatment, we encourage you to take your nausea medication as directed.  BELOW ARE SYMPTOMS THAT SHOULD BE REPORTED IMMEDIATELY: *FEVER GREATER THAN 100.4 F (38 C) OR HIGHER *CHILLS OR SWEATING *NAUSEA AND VOMITING THAT IS NOT CONTROLLED WITH YOUR NAUSEA MEDICATION *UNUSUAL SHORTNESS OF BREATH *UNUSUAL BRUISING OR BLEEDING *URINARY PROBLEMS (pain or burning when urinating, or frequent urination) *BOWEL PROBLEMS (unusual diarrhea, constipation, pain near the anus) TENDERNESS IN MOUTH AND THROAT WITH OR WITHOUT PRESENCE OF ULCERS (sore throat, sores in mouth, or a toothache) UNUSUAL RASH, SWELLING OR PAIN  UNUSUAL VAGINAL DISCHARGE OR ITCHING   Items with * indicate a potential emergency and should be followed up as soon as possible or go to the Emergency Department if any problems should occur.  Please show the CHEMOTHERAPY ALERT CARD or IMMUNOTHERAPY ALERT CARD at check-in to the Emergency Department and triage nurse.  Should you have  questions after your visit or need to cancel or reschedule your appointment, please contact [facility] [phone]  and follow the prompts.  Office hours are 8:00 a.m. to 4:30 p.m. Monday - Friday. Please note that voicemails left after 4:00 p.m. may not be returned until the following business day.  We are closed weekends and major holidays. You have access to a nurse at all times for urgent questions. Please call the main number to the clinic [phone] and follow the prompts.  For any non-urgent questions, you may also contact your provider using MyChart. We now offer e-Visits for anyone 65 and older to request care online for non-urgent symptoms. For details visit [email or web address].   Also download the MyChart app! Go to the app store, search "MyChart", open the app, select [facility], and log in with your MyChart username and password.  Due to Covid, a mask is required upon entering the hospital/clinic. If you do not have a mask, one will be given to you upon arrival. For doctor visits, patients may have 1 support person aged 9 or older with them. For treatment visits, patients cannot have anyone with them due to current Covid guidelines and our immunocompromised population.

## 2021-08-07 ENCOUNTER — Encounter (HOSPITAL_COMMUNITY): Payer: Self-pay | Admitting: Emergency Medicine

## 2021-08-07 ENCOUNTER — Ambulatory Visit (HOSPITAL_COMMUNITY): Payer: Medicare HMO | Admitting: Hematology

## 2021-08-07 ENCOUNTER — Emergency Department (HOSPITAL_COMMUNITY): Payer: Medicare HMO

## 2021-08-07 ENCOUNTER — Emergency Department (HOSPITAL_COMMUNITY)
Admission: EM | Admit: 2021-08-07 | Discharge: 2021-08-07 | Disposition: A | Discharge status: Home / Self Care | Payer: Medicare HMO | Attending: Emergency Medicine | Admitting: Emergency Medicine

## 2021-08-07 DIAGNOSIS — M79605 Pain in left leg: Secondary | ICD-10-CM | POA: Diagnosis not present

## 2021-08-07 DIAGNOSIS — M1712 Unilateral primary osteoarthritis, left knee: Secondary | ICD-10-CM | POA: Diagnosis not present

## 2021-08-07 DIAGNOSIS — I129 Hypertensive chronic kidney disease with stage 1 through stage 4 chronic kidney disease, or unspecified chronic kidney disease: Secondary | ICD-10-CM | POA: Insufficient documentation

## 2021-08-07 DIAGNOSIS — Z794 Long term (current) use of insulin: Secondary | ICD-10-CM | POA: Diagnosis not present

## 2021-08-07 DIAGNOSIS — E1122 Type 2 diabetes mellitus with diabetic chronic kidney disease: Secondary | ICD-10-CM | POA: Insufficient documentation

## 2021-08-07 DIAGNOSIS — M25562 Pain in left knee: Secondary | ICD-10-CM | POA: Diagnosis not present

## 2021-08-07 DIAGNOSIS — Z87891 Personal history of nicotine dependence: Secondary | ICD-10-CM | POA: Diagnosis not present

## 2021-08-07 DIAGNOSIS — I878 Other specified disorders of veins: Secondary | ICD-10-CM | POA: Diagnosis not present

## 2021-08-07 DIAGNOSIS — N182 Chronic kidney disease, stage 2 (mild): Secondary | ICD-10-CM | POA: Diagnosis not present

## 2021-08-07 DIAGNOSIS — Z7982 Long term (current) use of aspirin: Secondary | ICD-10-CM | POA: Diagnosis not present

## 2021-08-07 DIAGNOSIS — I8392 Asymptomatic varicose veins of left lower extremity: Secondary | ICD-10-CM | POA: Diagnosis not present

## 2021-08-07 LAB — CBC WITH DIFFERENTIAL/PLATELET
Abs Immature Granulocytes: 0.04 10*3/uL (ref 0.00–0.07)
Basophils Absolute: 0.2 10*3/uL — ABNORMAL HIGH (ref 0.0–0.1)
Basophils Relative: 1 %
Eosinophils Absolute: 0.6 10*3/uL — ABNORMAL HIGH (ref 0.0–0.5)
Eosinophils Relative: 5 %
HCT: 34.2 % — ABNORMAL LOW (ref 39.0–52.0)
Hemoglobin: 10.6 g/dL — ABNORMAL LOW (ref 13.0–17.0)
Immature Granulocytes: 0 %
Lymphocytes Relative: 23 %
Lymphs Abs: 2.8 10*3/uL (ref 0.7–4.0)
MCH: 33.8 pg (ref 26.0–34.0)
MCHC: 31 g/dL (ref 30.0–36.0)
MCV: 108.9 fL — ABNORMAL HIGH (ref 80.0–100.0)
Monocytes Absolute: 1.3 10*3/uL — ABNORMAL HIGH (ref 0.1–1.0)
Monocytes Relative: 11 %
Neutro Abs: 7.2 10*3/uL (ref 1.7–7.7)
Neutrophils Relative %: 60 %
Platelets: 340 10*3/uL (ref 150–400)
RBC: 3.14 MIL/uL — ABNORMAL LOW (ref 4.22–5.81)
RDW: 17.2 % — ABNORMAL HIGH (ref 11.5–15.5)
WBC: 12.1 10*3/uL — ABNORMAL HIGH (ref 4.0–10.5)
nRBC: 0 % (ref 0.0–0.2)

## 2021-08-07 LAB — BASIC METABOLIC PANEL
Anion gap: 6 (ref 5–15)
BUN: 16 mg/dL (ref 8–23)
CO2: 26 mmol/L (ref 22–32)
Calcium: 9.4 mg/dL (ref 8.9–10.3)
Chloride: 106 mmol/L (ref 98–111)
Creatinine, Ser: 1.36 mg/dL — ABNORMAL HIGH (ref 0.61–1.24)
GFR, Estimated: 58 mL/min — ABNORMAL LOW (ref >60)
Glucose, Bld: 134 mg/dL — ABNORMAL HIGH (ref 70–99)
Potassium: 4.4 mmol/L (ref 3.5–5.1)
Sodium: 138 mmol/L (ref 135–145)

## 2021-08-07 MED ORDER — DICLOFENAC SODIUM 1 % EX GEL
4.0000 g | Freq: Once | CUTANEOUS | Status: DC
  Filled 2021-08-07: qty 100

## 2021-08-07 MED ORDER — HYDROCODONE-ACETAMINOPHEN 5-325 MG PO TABS: 1.0000 | ORAL_TABLET | ORAL | 0 refills | Status: DC | PRN

## 2021-08-07 NOTE — ED Provider Notes (Signed)
[facility] EMERGENCY DEPARTMENT Provider Note   CSN: [id number] Arrival date & time: 08/07/21  8756     History Chief Complaint  Patient presents with   Knee Pain    Norman Peters is a 65 y.o. male.  Pt presents to the ED today with left knee pain.  Pt said he woke up with the pain around 0200 and it's been hurting since then.  Pain said it does go down to his left lower leg.  No back pain.  No upper leg pain.  Pt has had this happen to him in the past, but he was not told what it was.  He is able to ambulate and drove here.  I saw him a few weeks ago for severe anemia and hyperkalemia with ckd.  He has received blood and IV B12 and has been off his aldactone.      Past Medical History:  Diagnosis Date   Acute left-sided low back pain with left-sided sciatica 11/04/2020   Anxiety    Arthritis    Depression 2001   Diabetes mellitus 2010   GERD (gastroesophageal reflux disease)    Hyperlipidemia    Hypertension 2005    Patient Active Problem List   Diagnosis Date Noted   B12 deficiency 07/30/2021   Macrocytic anemia 07/22/2021   Anal itching 07/19/2021   Chronic kidney disease 07/10/2021   Type 2 diabetes mellitus with diabetic neuropathy, unspecified ([place]) 07/06/2021   Hospital discharge follow-up 11/04/2020   Seasonal allergies 09/12/2020   Nasal congestion 09/12/2020   Osteoarthritis of right knee 09/02/2020   Type 2 diabetes mellitus with stage 3a chronic kidney disease, with long-term current use of insulin ([place]) 02/05/2020   Vitamin D deficiency 01/11/2020   Encounter for support and coordination of transition of care 01/11/2020   Obesity (BMI 30.0-34.9) 09/03/2019   Dental caries 09/03/2019   Chronic elbow pain, left 06/23/2019   Type 2 diabetes mellitus with hyperglycemia ([place]) 05/03/2019   Chest pain with moderate risk of acute coronary syndrome 09/03/2018   Mixed hyperlipidemia 05/29/2017   Spasm of thoracic back muscle 10/06/2016   Pain in joint  involving multiple sites 02/12/2016   GERD (gastroesophageal reflux disease) 02/12/2016   Essential hypertension, benign 02/10/2016   Anxiety and depression 02/10/2016    Past Surgical History:  Procedure Laterality Date   COLONOSCOPY N/A 07/07/2016   Procedure: COLONOSCOPY;  Surgeon: Rogene Houston, MD;  Location[facility];  Service: Endoscopy;  Laterality: N/A;  1030   FINGER SURGERY Left 1974   fifth , trauma on job, had to replace/repair finger   HERNIA REPAIR  4332   x3, umbilical and bilateral hernia       Family History  Problem Relation Age of Onset   Stroke Mother     Social History   Tobacco Use   Smoking status: Former    Packs/day: 0.50    Years: 30.00    Pack years: 15.00    Types: Cigarettes    Quit date: 01/20/2011    Years since quitting: 10.5   Smokeless tobacco: Never  Vaping Use   Vaping Use: Never used  Substance Use Topics   Alcohol use: No   Drug use: No    Home Medications Prior to Admission medications   Medication Sig Start Date End Date Taking? Authorizing Provider  HYDROcodone-acetaminophen (NORCO/VICODIN) 5-325 MG tablet Take 1 tablet by mouth every 4 (four) hours as needed. 08/07/21  Yes Isla Pence, MD  albendazole [facility]) 200  MG tablet Take two tablets once today on an empty stomach, and repeat the same dose , taking two tablets one time only in two weeks Patient taking differently: Take 400 mg by mouth See admin instructions. Take two tablets once today on an empty stomach, and repeat the same dose , taking two tablets one time only in two weeks 07/17/21   Fayrene Helper, MD  albuterol (VENTOLIN HFA) 108 (90 Base) MCG/ACT inhaler Inhale 2 puffs into the lungs daily as needed for wheezing or shortness of breath. 07/21/21   Fayrene Helper, MD  amLODipine (NORVASC) 10 MG tablet TAKE 1 TABLET EVERY DAY Patient taking differently: Take 10 mg by mouth every morning. 06/24/21   Fayrene Helper, MD  aspirin EC 81 MG tablet  Take 81 mg by mouth every morning. 03/08/16   Fayrene Helper, MD  chlorhexidine (PERIDEX) 0.12 % solution 15 mLs by Mouth Rinse route 2 (two) times daily. 07/30/21   [provider]  gabapentin (NEURONTIN) 100 MG capsule Take 1 capsule (100 mg total) by mouth 3 (three) times daily. 07/06/21   Noreene Larsson, NP  LANTUS SOLOSTAR 100 UNIT/ML Solostar Pen INJECT 45 UNITS UNDER THE SKIN EVERY MORNING (DOSE INCREASE) Patient taking differently: Inject 55 Units into the skin every evening. 2330 06/24/21   Fayrene Helper, MD  montelukast (SINGULAIR) 10 MG tablet Take 1 tablet (10 mg total) by mouth daily. 07/12/21 07/12/22  Allie Bossier, MD  rosuvastatin (CRESTOR) 5 MG tablet TAKE 1 TABLET EVERY MONDAY, WEDNESDAY AND FRIDAY (DISCONTINUE PRAVASTATIN) Patient taking differently: Take 5 mg by mouth every Monday, Wednesday, and Friday. TAKE 1 TABLET EVERY MONDAY, WEDNESDAY AND FRIDAY (DISCONTINUE PRAVASTATIN) 06/01/21   Fayrene Helper, MD    Allergies    Ibuprofen, Lisinopril, Metformin and related, and Strawberry [berry]  Review of Systems   Review of Systems  Musculoskeletal:        Left knee pain  All other systems reviewed and are negative.  Physical Exam Updated Vital Signs BP (!) 157/86   Pulse 75   Temp 98.3 F (36.8 C) (Oral)   Resp 18   Ht 5\' 8"  (1.727 m)   Wt 96.2 kg   SpO2 99%   BMI 32.23 kg/m   Physical Exam Vitals and nursing note reviewed.  Constitutional:      Appearance: Normal appearance.  HENT:     Head: Normocephalic and atraumatic.     Right Ear: External ear normal.     Left Ear: External ear normal.     Nose: Nose normal.     Mouth/Throat:     Mouth: Mucous membranes are moist.     Pharynx: Oropharynx is clear.  Eyes:     Extraocular Movements: Extraocular movements intact.     Conjunctiva/sclera: Conjunctivae normal.     Pupils: Pupils are equal, round, and reactive to light.  Cardiovascular:     Rate and Rhythm: Normal rate and regular  rhythm.     Pulses: Normal pulses.     Heart sounds: Normal heart sounds.  Pulmonary:     Effort: Pulmonary effort is normal.     Breath sounds: Normal breath sounds.  Abdominal:     General: Abdomen is flat. Bowel sounds are normal.     Palpations: Abdomen is soft.  Musculoskeletal:     Cervical back: Normal range of motion and neck supple.       Legs:  Skin:    General: Skin is warm.  Capillary Refill: Capillary refill takes less than 2 seconds.  Neurological:     General: No focal deficit present.     Mental Status: He is alert and oriented to person, place, and time.  Psychiatric:        Mood and Affect: Mood normal.        Behavior: Behavior normal.    ED Results / Procedures / Treatments   Labs (all labs ordered are listed, but only abnormal results are displayed) Labs Reviewed  BASIC METABOLIC PANEL - Abnormal; Notable for the following components:      Result Value   Glucose, Bld 134 (*)    Creatinine, Ser 1.36 (*)    GFR, Estimated 58 (*)    All other components within normal limits  CBC WITH DIFFERENTIAL/PLATELET - Abnormal; Notable for the following components:   WBC 12.1 (*)    RBC 3.14 (*)    Hemoglobin 10.6 (*)    HCT 34.2 (*)    MCV 108.9 (*)    RDW 17.2 (*)    Monocytes Absolute 1.3 (*)    Eosinophils Absolute 0.6 (*)    Basophils Absolute 0.2 (*)    All other components within normal limits    EKG None  Radiology No results found.  Procedures Procedures   Medications Ordered in ED Medications - No data to display   ED Course  I have reviewed the triage vital signs and the nursing notes.  Pertinent labs & imaging results that were available during my care of the patient were reviewed by me and considered in my medical decision making (see chart for details).    MDM Rules/Calculators/A&P                           Cr improved.  K is nl.  Hgb much better.  [place] neg for DVT.  Xray with degenerative change.  Pt given Voltaren in the  ED.  He is d/c with a short course of pain meds.   Return if worse.  F/u with pcp.  Final Clinical Impression(s) / ED Diagnoses Final diagnoses:  Arthritis of left knee    Rx / DC Orders ED Discharge Orders          Ordered    HYDROcodone-acetaminophen (NORCO/VICODIN) 5-325 MG tablet  Every 4 hours PRN        08/07/21 1200             Isla Pence, MD 08/10/21 [phone]

## 2021-08-07 NOTE — ED Triage Notes (Signed)
Pt reports left knee pain and swelling that started around 2AM.  Pt states woke him from sleep and he was unable to return to sleep.  Pt states pain radiates to back of knee and down leg to foot.  Pt states unable to bend knee and pain increase with walking.  Pt reports similar episode in past and was never given a diagnosis.

## 2021-08-08 LAB — INTRINSIC FACTOR ANTIBODIES: Intrinsic Factor: 151 [AU]/ml — ABNORMAL HIGH (ref 0.0–1.1)

## 2021-08-08 LAB — ANTI-PARIETAL ANTIBODY: Parietal Cell Antibody-IgG: 51.7 U — ABNORMAL HIGH (ref 0.0–20.0)

## 2021-08-13 ENCOUNTER — Inpatient Hospital Stay (HOSPITAL_COMMUNITY): Payer: Medicare HMO | Attending: Hematology

## 2021-08-13 ENCOUNTER — Encounter (HOSPITAL_COMMUNITY): Payer: Self-pay

## 2021-08-13 VITALS — BP 156/78 | HR 87 | Temp 98.8°F | Resp 18

## 2021-08-13 DIAGNOSIS — Z794 Long term (current) use of insulin: Secondary | ICD-10-CM | POA: Diagnosis not present

## 2021-08-13 DIAGNOSIS — E538 Deficiency of other specified B group vitamins: Secondary | ICD-10-CM | POA: Insufficient documentation

## 2021-08-13 DIAGNOSIS — Z79899 Other long term (current) drug therapy: Secondary | ICD-10-CM | POA: Diagnosis not present

## 2021-08-13 DIAGNOSIS — E119 Type 2 diabetes mellitus without complications: Secondary | ICD-10-CM | POA: Diagnosis not present

## 2021-08-13 DIAGNOSIS — Z7982 Long term (current) use of aspirin: Secondary | ICD-10-CM | POA: Diagnosis not present

## 2021-08-13 DIAGNOSIS — G629 Polyneuropathy, unspecified: Secondary | ICD-10-CM | POA: Diagnosis not present

## 2021-08-13 DIAGNOSIS — D649 Anemia, unspecified: Secondary | ICD-10-CM | POA: Diagnosis not present

## 2021-08-13 DIAGNOSIS — N189 Chronic kidney disease, unspecified: Secondary | ICD-10-CM | POA: Insufficient documentation

## 2021-08-13 DIAGNOSIS — D539 Nutritional anemia, unspecified: Secondary | ICD-10-CM

## 2021-08-13 DIAGNOSIS — Z87891 Personal history of nicotine dependence: Secondary | ICD-10-CM | POA: Insufficient documentation

## 2021-08-13 MED ORDER — CYANOCOBALAMIN 1000 MCG/ML IJ SOLN
1000.0000 ug | Freq: Once | INTRAMUSCULAR | Status: AC
  Administered 2021-08-13: 1000 ug via INTRAMUSCULAR
  Filled 2021-08-13: qty 1

## 2021-08-13 NOTE — Progress Notes (Signed)
Patient tolerated B12 injection with no complaints voiced. Site clean and dry with no bruising or swelling noted at site. See MAR for details. Band aid applied.  Patient stable during and after injection. VSS with discharge and left in satisfactory condition with no s/s of distress noted. 

## 2021-08-13 NOTE — Patient Instructions (Signed)
[facility]  Discharge Instructions: Thank you for choosing [facility] to provide your oncology and hematology care.  If you have a lab appointment with the [facility], please come in thru the Main Entrance and check in at the main information desk.  Wear comfortable clothing and clothing appropriate for easy access to any Portacath or PICC line.   We strive to give you quality time with your provider. You may need to reschedule your appointment if you arrive late (15 or more minutes).  Arriving late affects you and other patients whose appointments are after yours.  Also, if you miss three or more appointments without notifying the office, you may be dismissed from the clinic at the provider's discretion.      For prescription refill requests, have your pharmacy contact our office and allow 72 hours for refills to be completed.    Today you received the following B12 injection, return as scheduled.   To help prevent nausea and vomiting after your treatment, we encourage you to take your nausea medication as directed.  BELOW ARE SYMPTOMS THAT SHOULD BE REPORTED IMMEDIATELY: *FEVER GREATER THAN 100.4 F (38 C) OR HIGHER *CHILLS OR SWEATING *NAUSEA AND VOMITING THAT IS NOT CONTROLLED WITH YOUR NAUSEA MEDICATION *UNUSUAL SHORTNESS OF BREATH *UNUSUAL BRUISING OR BLEEDING *URINARY PROBLEMS (pain or burning when urinating, or frequent urination) *BOWEL PROBLEMS (unusual diarrhea, constipation, pain near the anus) TENDERNESS IN MOUTH AND THROAT WITH OR WITHOUT PRESENCE OF ULCERS (sore throat, sores in mouth, or a toothache) UNUSUAL RASH, SWELLING OR PAIN  UNUSUAL VAGINAL DISCHARGE OR ITCHING   Items with * indicate a potential emergency and should be followed up as soon as possible or go to the Emergency Department if any problems should occur.  Please show the CHEMOTHERAPY ALERT CARD or IMMUNOTHERAPY ALERT CARD at check-in to the Emergency Department and triage  nurse.  Should you have questions after your visit or need to cancel or reschedule your appointment, please contact [facility] [phone]  and follow the prompts.  Office hours are 8:00 a.m. to 4:30 p.m. Monday - Friday. Please note that voicemails left after 4:00 p.m. may not be returned until the following business day.  We are closed weekends and major holidays. You have access to a nurse at all times for urgent questions. Please call the main number to the clinic [phone] and follow the prompts.  For any non-urgent questions, you may also contact your provider using MyChart. We now offer e-Visits for anyone 74 and older to request care online for non-urgent symptoms. For details visit [email or web address].   Also download the MyChart app! Go to the app store, search "MyChart", open the app, select [facility], and log in with your MyChart username and password.  Due to Covid, a mask is required upon entering the hospital/clinic. If you do not have a mask, one will be given to you upon arrival. For doctor visits, patients may have 1 support person aged 28 or older with them. For treatment visits, patients cannot have anyone with them due to current Covid guidelines and our immunocompromised population.

## 2021-08-19 ENCOUNTER — Other Ambulatory Visit: Payer: Self-pay

## 2021-08-19 ENCOUNTER — Ambulatory Visit (INDEPENDENT_AMBULATORY_CARE_PROVIDER_SITE_OTHER): Payer: Medicare HMO | Admitting: Family Medicine

## 2021-08-19 ENCOUNTER — Encounter: Payer: Self-pay | Admitting: Family Medicine

## 2021-08-19 VITALS — BP 142/82 | HR 62 | Resp 17 | Ht 68.0 in | Wt 210.0 lb

## 2021-08-19 DIAGNOSIS — I10 Essential (primary) hypertension: Secondary | ICD-10-CM

## 2021-08-19 DIAGNOSIS — E1165 Type 2 diabetes mellitus with hyperglycemia: Secondary | ICD-10-CM

## 2021-08-19 DIAGNOSIS — E538 Deficiency of other specified B group vitamins: Secondary | ICD-10-CM

## 2021-08-19 DIAGNOSIS — Z794 Long term (current) use of insulin: Secondary | ICD-10-CM | POA: Diagnosis not present

## 2021-08-19 DIAGNOSIS — E1140 Type 2 diabetes mellitus with diabetic neuropathy, unspecified: Secondary | ICD-10-CM | POA: Diagnosis not present

## 2021-08-19 DIAGNOSIS — M2550 Pain in unspecified joint: Secondary | ICD-10-CM

## 2021-08-19 DIAGNOSIS — M1711 Unilateral primary osteoarthritis, right knee: Secondary | ICD-10-CM

## 2021-08-19 MED ORDER — ACETAMINOPHEN ER 650 MG PO TBCR: 650.0000 mg | EXTENDED_RELEASE_TABLET | Freq: Three times a day (TID) | ORAL | 1 refills | Status: DC | PRN

## 2021-08-19 MED ORDER — KETOROLAC TROMETHAMINE 60 MG/2ML IM SOLN
60.0000 mg | Freq: Once | INTRAMUSCULAR | Status: AC
  Administered 2021-08-19: 60 mg via INTRAMUSCULAR

## 2021-08-19 NOTE — Patient Instructions (Addendum)
Annual exam in office by yearend if possible, re eval BP at that visit  Microalb today  Blood sugar has improved, and getting the Vit B12 injections and transfusion has helped  ES tylenol for arthritis pain  Toradol 60 mg IM in the office today, and ES tylenol is prescribed for arthritis    Use voltaren  gel to affected joints  It is important that you exercise regularly at least 30 minutes 5 times a week. If you develop chest pain, have severe difficulty breathing, or feel very tired, stop exercising immediately and seek medical attention    Think about what you will eat, plan ahead. Choose " clean, [place], fresh or frozen" over canned, processed or packaged foods which are more sugary, salty and fatty. 70 to 75% of food eaten should be vegetables and fruit. Three meals at set times with snacks allowed between meals, but they must be fruit or vegetables. Aim to eat over a 12 hour period , example 7 am to 7 pm, and STOP after  your last meal of the day. Drink water,generally about 64 ounces per day, no other drink is as healthy. Fruit juice is best enjoyed in a healthy way, by EATING the fruit. Thanks for choosing [facility], we consider it a privelige to serve you.

## 2021-08-19 NOTE — Assessment & Plan Note (Signed)
Elevated at visit , but has not takinmg meds today DASH diet and commitment to daily physical activity for a minimum of 30 minutes discussed and encouraged, as a part of hypertension management. The importance of attaining a healthy weight is also discussed.  BP/Weight 08/19/2021 08/13/2021 08/07/2021 08/06/2021 07/30/2021 07/22/2021 3/56/8616  Systolic BP 837 290 211 155 208 022 336  Diastolic BP 82 78 86 76 60 76 63  Wt. (Lbs) 210.04 - 212 - 212.4 204.2 207.2  BMI 31.94 - 32.23 - 32.3 31.05 31.5

## 2021-08-19 NOTE — Progress Notes (Signed)
[facility] [place], [place] [address]   CLINIC:  Medical Oncology/Hematology  PCP:  Norman Helper, MD [address] / [place] [address]  [phone]  REASON FOR VISIT:  Follow-up for anemia and vitamin B-12 deficiency  PRIOR THERAPY: none  CURRENT THERAPY: weekly vitamin B-12 injections  INTERVAL HISTORY:  [name], a 65 y.o. male, returns for routine follow-up for his anemia and vitamin B-12 deficiency. Norman Peters was last seen on 07/30/2021.  Today he reports feeling good. He reports pain and swelling in his feet. He reports improved tingling in his hands and feet. He also reports constipation which is currently controlled with stool softener.  REVIEW OF SYSTEMS:  Review of Systems  Constitutional:  Negative for appetite change and fatigue (70%).  HENT:   Positive for trouble swallowing (chewing).   Respiratory:  Positive for cough and shortness of breath.   Cardiovascular:  Positive for leg swelling (feet).  Musculoskeletal:  Positive for arthralgias (3/10).  Neurological:  Positive for dizziness and numbness (improved tingling and numbness).  Psychiatric/Behavioral:  Positive for depression and sleep disturbance. The patient is nervous/anxious.   All other systems reviewed and are negative.  PAST MEDICAL/SURGICAL HISTORY:  Past Medical History:  Diagnosis Date   Acute left-sided low back pain with left-sided sciatica 11/04/2020   Anxiety    Arthritis    Depression 2001   Diabetes mellitus 2010   GERD (gastroesophageal reflux disease)    Hyperlipidemia    Hypertension 2005   Past Surgical History:  Procedure Laterality Date   COLONOSCOPY N/A 07/07/2016   Procedure: COLONOSCOPY;  Surgeon: Norman Houston, MD;  Location: [facility];  Service: Endoscopy;  Laterality: N/A;  1030   FINGER SURGERY Left 1974   fifth , trauma on job, had to replace/repair finger   HERNIA REPAIR  6712   x3, umbilical and  bilateral hernia    SOCIAL HISTORY:  Social History   Socioeconomic History   Marital status: Married    Spouse name: Not on file   Number of children: Not on file   Years of education: Not on file   Highest education level: Not on file  Occupational History   Not on file  Tobacco Use   Smoking status: Former    Packs/day: 0.50    Years: 30.00    Pack years: 15.00    Types: Cigarettes    Quit date: 01/20/2011    Years since quitting: 10.5   Smokeless tobacco: Never  Vaping Use   Vaping Use: Never used  Substance and Sexual Activity   Alcohol use: No   Drug use: No   Sexual activity: Not Currently  Other Topics Concern   Not on file  Social History Narrative   Not on file   Social Determinants of Health   Financial Resource Strain: Low Risk    Difficulty of Paying Living Expenses: Not hard at all  Food Insecurity: No Food Insecurity   Worried About Charity fundraiser in the Last Year: Never true   [place] in the Last Year: Never true  Transportation Needs: No Transportation Needs   Lack of Transportation (Medical): No   Lack of Transportation (Non-Medical): No  Physical Activity: Insufficiently Active   Days of Exercise per Week: 5 days   Minutes of Exercise per Session: 20 min  Stress: No Stress Concern Present   Feeling of Stress : Not at all  Social Connections: Engineer, building services of Communication with Friends and Family: More than three times a week   Frequency of Social Gatherings with Friends and Family: Once a week   Attends Religious Services: More than 4 times per year   Active Member of [employer] or Organizations: Yes   Attends Music therapist: More than 4 times per year   Marital Status: Married  Human resources officer Violence: Not At Risk   Fear of Current or Ex-Partner: No   Emotionally Abused: No   Physically Abused: No   Sexually Abused: No    FAMILY HISTORY:  Family History  Problem Relation Age of Onset    Stroke Mother     CURRENT MEDICATIONS:  Current Outpatient Medications  Medication Sig Dispense Refill   acetaminophen (TYLENOL 8 HOUR ARTHRITIS PAIN) 650 MG CR tablet Take 1 tablet (650 mg total) by mouth every 8 (eight) hours as needed for pain. 45 tablet 1   albuterol (VENTOLIN HFA) 108 (90 Base) MCG/ACT inhaler Inhale 2 puffs into the lungs daily as needed for wheezing or shortness of breath. 1 each 1   amLODipine (NORVASC) 10 MG tablet TAKE 1 TABLET EVERY DAY (Patient taking differently: Take 10 mg by mouth every morning.) 90 tablet 1   aspirin EC 81 MG tablet Take 81 mg by mouth every morning.     busPIRone (BUSPAR) 5 MG tablet Take 5 mg by mouth 2 (two) times daily.     chlorhexidine (PERIDEX) 0.12 % solution 15 mLs by Mouth Rinse route 2 (two) times daily.     diclofenac Sodium (VOLTAREN) 1 % GEL Apply 4 g topically 2 (two) times daily.     FLUoxetine (PROZAC) 40 MG capsule Take 40 mg by mouth daily.     gabapentin (NEURONTIN) 100 MG capsule Take 1 capsule (100 mg total) by mouth 3 (three) times daily. 90 capsule 3   LANTUS SOLOSTAR 100 UNIT/ML Solostar Pen INJECT 45 UNITS UNDER THE SKIN EVERY MORNING (DOSE INCREASE) (Patient taking differently: Inject 55 Units into the skin every evening. 2330) 45 mL 3   montelukast (SINGULAIR) 10 MG tablet Take 1 tablet (10 mg total) by mouth daily. 30 tablet 0   oxymetazoline (AFRIN) 0.05 % nasal spray Place 2 sprays into both nostrils 2 (two) times daily. Equate brand     Propylene Glycol (SYSTANE COMPLETE OP) Apply 2 drops to eye.     rosuvastatin (CRESTOR) 5 MG tablet TAKE 1 TABLET EVERY MONDAY, WEDNESDAY AND FRIDAY (DISCONTINUE PRAVASTATIN) (Patient taking differently: Take 5 mg by mouth every Monday, Wednesday, and Friday. TAKE 1 TABLET EVERY MONDAY, WEDNESDAY AND FRIDAY (DISCONTINUE PRAVASTATIN)) 36 tablet 3   sodium chloride (OCEAN) 0.65 % SOLN nasal spray Place 2 sprays into both nostrils as needed for congestion.     No current  facility-administered medications for this visit.    ALLERGIES:  Allergies  Allergen Reactions   Ibuprofen Rash   Lisinopril Cough   Metformin And Related Cough   Strawberry [Berry] Hives and Swelling    TONGUE SWELLING    PHYSICAL EXAM:  Performance status (ECOG): 1 - Symptomatic but completely ambulatory  There were no vitals filed for this visit. Wt Readings from Last 3 Encounters:  08/19/21 210 lb 0.6 oz (95.3 kg)  08/07/21 212 lb (96.2 kg)  07/30/21 212 lb 6.4 oz (96.3 kg)   Physical Exam Vitals reviewed.  Constitutional:      Appearance: Normal appearance.  Cardiovascular:     Rate and  Rhythm: Normal rate and regular rhythm.     Pulses: Normal pulses.     Heart sounds: Normal heart sounds.  Pulmonary:     Effort: Pulmonary effort is normal.     Breath sounds: Normal breath sounds.  Neurological:     General: No focal deficit present.     Mental Status: He is alert and oriented to person, place, and time.  Psychiatric:        Mood and Affect: Mood normal.        Behavior: Behavior normal.    LABORATORY DATA:  I have reviewed the labs as listed.  CBC Latest Ref Rng & Units 08/07/2021 08/06/2021 07/22/2021  WBC 4.0 - 10.5 K/uL 12.1(H) 11.3(H) 7.4  Hemoglobin 13.0 - 17.0 g/dL 10.6(L) 10.9(L) 9.6(L)  Hematocrit 39.0 - 52.0 % 34.2(L) 34.8(L) 29.9(L)  Platelets 150 - 400 K/uL 340 384 177   CMP Latest Ref Rng & Units 08/07/2021 07/18/2021 07/17/2021  Glucose 70 - 99 mg/dL 134(H) 174(H) 87  BUN 8 - 23 mg/dL 16 37(H) 38(H)  Creatinine 0.61 - 1.24 mg/dL 1.36(H) 2.13(H) 1.98(H)  Sodium 135 - 145 mmol/L 138 135 136  Potassium 3.5 - 5.1 mmol/L 4.4 5.2(H) 6.1(H)  Chloride 98 - 111 mmol/L 106 104 101  CO2 22 - 32 mmol/L 26 25 20   Calcium 8.9 - 10.3 mg/dL 9.4 8.7(L) 9.4  Total Protein 6.0 - 8.5 g/dL - - 7.7  Total Bilirubin 0.0 - 1.2 mg/dL - - 1.4(H)  Alkaline Phos 44 - 121 IU/L - - 63  AST 0 - 40 IU/L - - 60(H)  ALT 0 - 44 IU/L - - 23      Component Value Date/Time    RBC 3.14 (L) 08/07/2021 1018   MCV 108.9 (H) 08/07/2021 1018   MCV 107 (H) 07/17/2021 1058   MCH 33.8 08/07/2021 1018   MCHC 31.0 08/07/2021 1018   RDW 17.2 (H) 08/07/2021 1018   RDW 20.7 (H) 07/17/2021 1058   LYMPHSABS 2.8 08/07/2021 1018   LYMPHSABS 3.1 07/17/2021 1058   MONOABS 1.3 (H) 08/07/2021 1018   EOSABS 0.6 (H) 08/07/2021 1018   EOSABS 0.5 (H) 07/17/2021 1058   BASOSABS 0.2 (H) 08/07/2021 1018   BASOSABS 0.0 07/17/2021 1058    DIAGNOSTIC IMAGING:  I have independently reviewed the scans and discussed with the patient. US Venous Img Lower Unilateral Left  Result Date: 08/07/2021 CLINICAL DATA:  Left leg pain acutely EXAM: LEFT LOWER EXTREMITY VENOUS DOPPLER ULTRASOUND TECHNIQUE: Gray-scale sonography with graded compression, as well as color Doppler and duplex ultrasound were performed to evaluate the lower extremity deep venous systems from the level of the common femoral vein and including the common femoral, femoral, profunda femoral, popliteal and calf veins including the posterior tibial, peroneal and gastrocnemius veins when visible. The superficial great saphenous vein was also interrogated. Spectral Doppler was utilized to evaluate flow at rest and with distal augmentation maneuvers in the common femoral, femoral and popliteal veins. COMPARISON:  None. FINDINGS: Contralateral Common Femoral Vein: Respiratory phasicity is normal and symmetric with the symptomatic side. No evidence of thrombus. Normal compressibility. Common Femoral Vein: No evidence of thrombus. Normal compressibility, respiratory phasicity and response to augmentation. Saphenofemoral Junction: No evidence of thrombus. Normal compressibility and flow on color Doppler imaging. Profunda Femoral Vein: No evidence of thrombus. Normal compressibility and flow on color Doppler imaging. Femoral Vein: No evidence of thrombus. Normal compressibility, respiratory phasicity and response to augmentation. Popliteal Vein: No  evidence of thrombus. Normal  compressibility, respiratory phasicity and response to augmentation. Prominent to mildly enlarged without thrombus. Calf Veins: Tibial and peroneal veins appear patent. Peroneal vein is mildly dilated in appearance, nonspecific. No thrombus appreciated. Other Findings:  Lateral lower leg subsurface varicosities noted. IMPRESSION: Negative for acute DVT. Non-specific dilated appearing popliteal and calf peroneal veins. Lateral lower leg subsurface varicose veins. Electronically Signed   By: Jerilynn Mages.  Shick M.D.   On: 08/07/2021 11:39   DG Knee Complete 4 Views Left  Result Date: 08/07/2021 CLINICAL DATA:  Left knee pain and swelling EXAM: LEFT KNEE - COMPLETE 4+ VIEW COMPARISON:  10/30/2020 FINDINGS: Moderate joint space narrowing and spurring laterally, unchanged. Medial joint space normal. Patellofemoral joint normal. Negative for fracture.  Possible small effusion. Chronic sclerotic lesion in the medullary cavity of the distal femur is unchanged from prior studies and appears benign. IMPRESSION: Moderate degenerative change in the lateral joint space unchanged from the prior study. No acute abnormality. Electronically Signed   By: Franchot Gallo M.D.   On: 08/07/2021 11:16     ASSESSMENT:  Macrocytic anemia: - Severe macrocytic anemia on 07/10/2021 with hemoglobin 6.9. - Required 2 units of RBC in the last 2 weeks. - Colonoscopy on 07/07/2016 with 5 mm rectosigmoid hyperplastic polyp removed. - CTAP on 03/07/2021 with spleen size normal. - Has occasional blood on tissue when constipated, most likely from external hemorrhoids.  Denies any fevers, night sweats or weight loss.   Social/family history: - He lives at home with his wife.  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  No exposure to chemicals.  He smoked 1 pack/day for 40 years.  Quit smoking in 2012. - No family history of cancers or  leukemias.   PLAN:  Severe macrocytic anemia secondary to vitamin B12 deficiency: - We discussed the results of intrinsic factor and parietal cell antibodies which are very high indicating pernicious anemia. - We discussed the need for parenteral B12 therapy moving forward. - Recheck B12 level which is 559 today.  He has received 4 weekly doses.  We will switch his B12 to every 4 weeks at this time.  We will see him back in 8 weeks with repeat labs.   Peripheral neuropathy: - His neuropathy which is new in the fingertips and toes has gotten slightly better. - Continue gabapentin 100 mg 3 times daily.   CKD: - He has CKD since 2017 with baseline creatinine around 1.5. - This is also contributing to his anemia.  Orders placed this encounter:  No orders of the defined types were placed in this encounter.    Derek Jack, MD [facility] [phone]   I, Thana Ates, am acting as a scribe for Dr. Derek Jack.  I, Derek Jack MD, have reviewed the above documentation for accuracy and completeness, and I agree with the above.

## 2021-08-19 NOTE — Assessment & Plan Note (Signed)
Acute flare, toradol in office followed by tylenol 650mg  three times daily for 5 to 7 dsys, then as needed

## 2021-08-19 NOTE — Progress Notes (Signed)
Norman Peters     MRN: [id number]      DOB: 18-Sep-1956   HPI [name] is here for follow up and re-evaluation of chronic medical conditions, medication management and review of any available recent lab and radiology data.  Preventive health is updated, specifically  Cancer screening and Immunization.   Being treated for B12 deficiency and followed closely by Hematology, has ahd 2 blood transfusions, feels more energized No more perianal itching noted   Reports marked improvement in blood sugar has log where he tests twice daily and  The PT denies any adverse reactions to current medications since the last visit.   FBG ranges from70 to 150, , bedtime 117 ROS Denies recent fever or chills. Denies sinus pressure, nasal congestion, ear pain or sore throat. Denies chest congestion, productive cough or wheezing. Denies chest pains, palpitations and leg swelling Denies abdominal pain, nausea, vomiting,diarrhea or constipation.   Denies dysuria, frequency, hesitancy or incontinence. C/o severe and disabling  joint pain, swelling generalized  and limitation in mobility. Denies headaches, seizures, numbness, or tingling. Denies depression, anxiety or insomnia. Denies skin break down or rash.   PE  BP (!) 158/90   Pulse 62   Resp 17   Ht 5\' 8"  (1.727 m)   Wt 210 lb 0.6 oz (95.3 kg)   SpO2 94%   BMI 31.94 kg/m   Patient alert and oriented and in no cardiopulmonary distress.  HEENT: No facial asymmetry, EOMI,     Neck supple .  Chest: Clear to auscultation bilaterally.  CVS: S1, S2 no murmurs, no S3.Regular rate.  ABD: Soft non tender.   Ext: No edema  MS: decreased  ROM spine, shoulders, hips and knees.Deformity of IP joints of hands  Skin: Intact, no ulcerations or rash noted.  Psych: Good eye contact, normal affect. Memory intact not anxious or depressed appearing.  CNS: CN 2-12 intact, power,  normal throughout.no focal deficits noted.   Assessment &  Plan  Essential hypertension, benign Elevated at visit , but has not takinmg meds today DASH diet and commitment to daily physical activity for a minimum of 30 minutes discussed and encouraged, as a part of hypertension management. The importance of attaining a healthy weight is also discussed.  BP/Weight 08/19/2021 08/13/2021 08/07/2021 08/06/2021 07/30/2021 07/22/2021 01/21/1760  Systolic BP 607 371 062 694 854 627 035  Diastolic BP 82 78 86 76 60 76 63  Wt. (Lbs) 210.04 - 212 - 212.4 204.2 207.2  BMI 31.94 - 32.23 - 32.3 31.05 31.5       B12 deficiency Currently getting once weekly B12 injections with close f/u with hematology who is managing the condition  Type 2 diabetes mellitus with diabetic neuropathy, unspecified ([place]) Norman Peters is reminded of the importance of commitment to daily physical activity for 30 minutes or more, as able and the need to limit carbohydrate intake to 30 to 60 grams per meal to help with blood sugar control.   The need to take medication as prescribed, test blood sugar as directed, and to call between visits if there is a concern that blood sugar is uncontrolled is also discussed.   Norman Peters is reminded of the importance of daily foot exam, annual eye examination, and good blood sugar, blood pressure and cholesterol control.  Diabetic Labs Latest Ref Rng & Units 08/07/2021 07/18/2021 07/17/2021 07/12/2021 07/11/2021  HbA1c 4.0 - 5.6 % - - - - -  Microalbumin mg/L - - - - -  Micro/Creat  Ratio - - - - - -  Chol 100 - 199 mg/dL - - - - -  HDL >39 mg/dL - - - - -  Calc LDL 0 - 99 mg/dL - - - - -  Triglycerides 0 - 149 mg/dL - - - - -  Creatinine 0.61 - 1.24 mg/dL 1.36(H) 2.13(H) 1.98(H) 1.56(H) 1.48(H)   BP/Weight 08/19/2021 08/13/2021 08/07/2021 08/06/2021 07/30/2021 07/22/2021 8/64/8472  Systolic BP 072 182 883 374 451 460 479  Diastolic BP 82 78 86 76 60 76 63  Wt. (Lbs) 210.04 - 212 - 212.4 204.2 207.2  BMI 31.94 - 32.23 - 32.3 31.05 31.5   Foot/eye exam  completion dates Latest Ref Rng & Units 07/01/2021 06/29/2021  Eye Exam No Retinopathy Retinopathy(A) Retinopathy(A)  Foot Form Completion - - -      Improved based on record provided Will be updated next month by Endo who is managing him  Pain in joint involving multiple sites Acute flare, toradol in office followed by tylenol 650mg  three times daily for 5 to 7 dsys, then as needed

## 2021-08-19 NOTE — Assessment & Plan Note (Signed)
Currently getting once weekly B12 injections with close f/u with hematology who is managing the condition

## 2021-08-19 NOTE — Assessment & Plan Note (Signed)
Norman Peters is reminded of the importance of commitment to daily physical activity for 30 minutes or more, as able and the need to limit carbohydrate intake to 30 to 60 grams per meal to help with blood sugar control.   The need to take medication as prescribed, test blood sugar as directed, and to call between visits if there is a concern that blood sugar is uncontrolled is also discussed.   Norman Peters is reminded of the importance of daily foot exam, annual eye examination, and good blood sugar, blood pressure and cholesterol control.  Diabetic Labs Latest Ref Rng & Units 08/07/2021 07/18/2021 07/17/2021 07/12/2021 07/11/2021  HbA1c 4.0 - 5.6 % - - - - -  Microalbumin mg/L - - - - -  Micro/Creat Ratio - - - - - -  Chol 100 - 199 mg/dL - - - - -  HDL >39 mg/dL - - - - -  Calc LDL 0 - 99 mg/dL - - - - -  Triglycerides 0 - 149 mg/dL - - - - -  Creatinine 0.61 - 1.24 mg/dL 1.36(H) 2.13(H) 1.98(H) 1.56(H) 1.48(H)   BP/Weight 08/19/2021 08/13/2021 08/07/2021 08/06/2021 07/30/2021 07/22/2021 9/45/8592  Systolic BP 924 462 863 817 711 657 903  Diastolic BP 82 78 86 76 60 76 63  Wt. (Lbs) 210.04 - 212 - 212.4 204.2 207.2  BMI 31.94 - 32.23 - 32.3 31.05 31.5   Foot/eye exam completion dates Latest Ref Rng & Units 07/01/2021 06/29/2021  Eye Exam No Retinopathy Retinopathy(A) Retinopathy(A)  Foot Form Completion - - -      Improved based on record provided Will be updated next month by Endo who is managing him

## 2021-08-20 ENCOUNTER — Inpatient Hospital Stay (HOSPITAL_COMMUNITY): Payer: Medicare HMO

## 2021-08-20 ENCOUNTER — Inpatient Hospital Stay (HOSPITAL_BASED_OUTPATIENT_CLINIC_OR_DEPARTMENT_OTHER): Payer: Medicare HMO | Admitting: Hematology

## 2021-08-20 VITALS — BP 153/86 | HR 82 | Temp 98.0°F | Resp 18 | Wt 214.2 lb

## 2021-08-20 DIAGNOSIS — E538 Deficiency of other specified B group vitamins: Secondary | ICD-10-CM | POA: Diagnosis not present

## 2021-08-20 DIAGNOSIS — D539 Nutritional anemia, unspecified: Secondary | ICD-10-CM

## 2021-08-20 DIAGNOSIS — N189 Chronic kidney disease, unspecified: Secondary | ICD-10-CM | POA: Diagnosis not present

## 2021-08-20 DIAGNOSIS — D649 Anemia, unspecified: Secondary | ICD-10-CM | POA: Diagnosis not present

## 2021-08-20 DIAGNOSIS — Z79899 Other long term (current) drug therapy: Secondary | ICD-10-CM | POA: Diagnosis not present

## 2021-08-20 DIAGNOSIS — Z87891 Personal history of nicotine dependence: Secondary | ICD-10-CM | POA: Diagnosis not present

## 2021-08-20 DIAGNOSIS — Z7982 Long term (current) use of aspirin: Secondary | ICD-10-CM | POA: Diagnosis not present

## 2021-08-20 DIAGNOSIS — E119 Type 2 diabetes mellitus without complications: Secondary | ICD-10-CM | POA: Diagnosis not present

## 2021-08-20 DIAGNOSIS — G629 Polyneuropathy, unspecified: Secondary | ICD-10-CM | POA: Diagnosis not present

## 2021-08-20 DIAGNOSIS — Z794 Long term (current) use of insulin: Secondary | ICD-10-CM | POA: Diagnosis not present

## 2021-08-20 LAB — CBC WITH DIFFERENTIAL/PLATELET
Abs Immature Granulocytes: 0.03 10*3/uL (ref 0.00–0.07)
Basophils Absolute: 0.1 10*3/uL (ref 0.0–0.1)
Basophils Relative: 1 %
Eosinophils Absolute: 0.7 10*3/uL — ABNORMAL HIGH (ref 0.0–0.5)
Eosinophils Relative: 5 %
HCT: 37.9 % — ABNORMAL LOW (ref 39.0–52.0)
Hemoglobin: 11.9 g/dL — ABNORMAL LOW (ref 13.0–17.0)
Immature Granulocytes: 0 %
Lymphocytes Relative: 21 %
Lymphs Abs: 3 10*3/uL (ref 0.7–4.0)
MCH: 31.6 pg (ref 26.0–34.0)
MCHC: 31.4 g/dL (ref 30.0–36.0)
MCV: 100.5 fL — ABNORMAL HIGH (ref 80.0–100.0)
Monocytes Absolute: 1.4 10*3/uL — ABNORMAL HIGH (ref 0.1–1.0)
Monocytes Relative: 10 %
Neutro Abs: 8.6 10*3/uL — ABNORMAL HIGH (ref 1.7–7.7)
Neutrophils Relative %: 63 %
Platelets: 259 10*3/uL (ref 150–400)
RBC: 3.77 MIL/uL — ABNORMAL LOW (ref 4.22–5.81)
RDW: 15 % (ref 11.5–15.5)
WBC: 13.9 10*3/uL — ABNORMAL HIGH (ref 4.0–10.5)
nRBC: 0 % (ref 0.0–0.2)

## 2021-08-20 LAB — VITAMIN B12: Vitamin B-12: 559 pg/mL (ref 180–914)

## 2021-08-20 LAB — RETICULOCYTES
Immature Retic Fract: 9.6 % (ref 2.3–15.9)
RBC.: 3.78 MIL/uL — ABNORMAL LOW (ref 4.22–5.81)
Retic Count, Absolute: 138.3 10*3/uL (ref 19.0–186.0)
Retic Ct Pct: 3.7 % — ABNORMAL HIGH (ref 0.4–3.1)

## 2021-08-20 LAB — LACTATE DEHYDROGENASE: LDH: 205 U/L — ABNORMAL HIGH (ref 98–192)

## 2021-08-20 MED ORDER — CYANOCOBALAMIN 1000 MCG/ML IJ SOLN
1000.0000 ug | Freq: Once | INTRAMUSCULAR | Status: AC
  Administered 2021-08-20: 1000 ug via INTRAMUSCULAR
  Filled 2021-08-20: qty 1

## 2021-08-20 MED ORDER — CYANOCOBALAMIN 1000 MCG/ML IJ SOLN: 1000.0000 ug | Freq: Once | INTRAMUSCULAR | Status: DC

## 2021-08-20 NOTE — Patient Instructions (Signed)
[facility]  Discharge Instructions: Thank you for choosing [facility] to provide your oncology and hematology care.  If you have a lab appointment with the Cancer Center, please come in thru the Main Entrance and check in at the main information desk.  Wear comfortable clothing and clothing appropriate for easy access to any Portacath or PICC line.   We strive to give you quality time with your provider. You may need to reschedule your appointment if you arrive late (15 or more minutes).  Arriving late affects you and other patients whose appointments are after yours.  Also, if you miss three or more appointments without notifying the office, you may be dismissed from the clinic at the provider's discretion.      For prescription refill requests, have your pharmacy contact our office and allow 72 hours for refills to be completed.    Today you received the following chemotherapy and/or immunotherapy agents B12      To help prevent nausea and vomiting after your treatment, we encourage you to take your nausea medication as directed.  BELOW ARE SYMPTOMS THAT SHOULD BE REPORTED IMMEDIATELY: *FEVER GREATER THAN 100.4 F (38 C) OR HIGHER *CHILLS OR SWEATING *NAUSEA AND VOMITING THAT IS NOT CONTROLLED WITH YOUR NAUSEA MEDICATION *UNUSUAL SHORTNESS OF BREATH *UNUSUAL BRUISING OR BLEEDING *URINARY PROBLEMS (pain or burning when urinating, or frequent urination) *BOWEL PROBLEMS (unusual diarrhea, constipation, pain near the anus) TENDERNESS IN MOUTH AND THROAT WITH OR WITHOUT PRESENCE OF ULCERS (sore throat, sores in mouth, or a toothache) UNUSUAL RASH, SWELLING OR PAIN  UNUSUAL VAGINAL DISCHARGE OR ITCHING   Items with * indicate a potential emergency and should be followed up as soon as possible or go to the Emergency Department if any problems should occur.  Please show the CHEMOTHERAPY ALERT CARD or IMMUNOTHERAPY ALERT CARD at check-in to the Emergency Department  and triage nurse.  Should you have questions after your visit or need to cancel or reschedule your appointment, please contact [facility] [phone]  and follow the prompts.  Office hours are 8:00 a.m. to 4:30 p.m. Monday - Friday. Please note that voicemails left after 4:00 p.m. may not be returned until the following business day.  We are closed weekends and major holidays. You have access to a nurse at all times for urgent questions. Please call the main number to the clinic [phone] and follow the prompts.  For any non-urgent questions, you may also contact your provider using MyChart. We now offer e-Visits for anyone 18 and older to request care online for non-urgent symptoms. For details visit [email or web address].   Also download the MyChart app! Go to the app store, search "MyChart", open the app, select [facility], and log in with your MyChart username and password.  Due to Covid, a mask is required upon entering the hospital/clinic. If you do not have a mask, one will be given to you upon arrival. For doctor visits, patients may have 1 support person aged 18 or older with them. For treatment visits, patients cannot have anyone with them due to current Covid guidelines and our immunocompromised population.  

## 2021-08-20 NOTE — Progress Notes (Signed)
Norman Peters presents today for B12 injection per the provider's orders.  Stable during administration without incident; injection site WNL; see MAR for injection details.  Patient tolerated procedure well and without incident.  No questions or complaints noted at this time.  Discharge from clinic ambulatory in stable condition.  Alert and oriented X 3.  Follow up with [facility] as scheduled.

## 2021-08-20 NOTE — Patient Instructions (Addendum)
[place] at [facility] Discharge Instructions  You were seen and examined by Dr. Delton Coombes. You received b12 injection today. You will be scheduled for b12 injections every 4 weeks. Return to clinic in 2 months with labs as scheduled.     Thank you for choosing [place] at [facility] to provide your oncology and hematology care.  To afford each patient quality time with our provider, please arrive at least 15 minutes before your scheduled appointment time.   If you have a lab appointment with the [facility] please come in thru the Main Entrance and check in at the main information desk.  You need to re-schedule your appointment should you arrive 10 or more minutes late.  We strive to give you quality time with our providers, and arriving late affects you and other patients whose appointments are after yours.  Also, if you no show three or more times for appointments you may be dismissed from the clinic at the providers discretion.     Again, thank you for choosing [facility].  Our hope is that these requests will decrease the amount of time that you wait before being seen by our physicians.       _____________________________________________________________  Should you have questions after your visit to [facility], please contact our office at [phone] and follow the prompts.  Our office hours are 8:00 a.m. and 4:30 p.m. Monday - Friday.  Please note that voicemails left after 4:00 p.m. may not be returned until the following business day.  We are closed weekends and major holidays.  You do have access to a nurse 24-7, just call the main number to the clinic [phone] and do not press any options, hold on the line and a nurse will answer the phone.    For prescription refill requests, have your pharmacy contact our office and allow 72 hours.    Due to Covid, you will need to wear a mask upon entering  the hospital. If you do not have a mask, a mask will be given to you at the Main Entrance upon arrival. For doctor visits, patients may have 1 support person age 31 or older with them. For treatment visits, patients can not have anyone with them due to social distancing guidelines and our immunocompromised population.

## 2021-08-21 ENCOUNTER — Encounter (HOSPITAL_COMMUNITY): Payer: Self-pay | Admitting: Hematology

## 2021-08-21 LAB — MICROALBUMIN / CREATININE URINE RATIO
Creatinine, Urine: 203.6 mg/dL
Microalb/Creat Ratio: 620 mg/g creat — ABNORMAL HIGH (ref 0–29)
Microalbumin, Urine: 1261.9 ug/mL

## 2021-09-04 ENCOUNTER — Telehealth: Payer: Self-pay | Admitting: Family Medicine

## 2021-09-04 ENCOUNTER — Other Ambulatory Visit: Payer: Self-pay

## 2021-09-04 MED ORDER — GLUCOSE BLOOD VI STRP: ORAL_STRIP | 3 refills | Status: DC

## 2021-09-04 NOTE — Telephone Encounter (Signed)
Please send in Test strips for the patient

## 2021-09-04 NOTE — Telephone Encounter (Signed)
Refill for glucose strips sent to [facility]

## 2021-09-04 NOTE — Telephone Encounter (Signed)
Pt called in for refill on Glucose strips. Pt states he has not received any from [facility]

## 2021-09-04 NOTE — Telephone Encounter (Signed)
This has been done twice today

## 2021-09-17 ENCOUNTER — Other Ambulatory Visit: Payer: Self-pay

## 2021-09-17 ENCOUNTER — Inpatient Hospital Stay (HOSPITAL_COMMUNITY): Payer: Medicare HMO | Attending: Hematology

## 2021-09-17 ENCOUNTER — Encounter (HOSPITAL_COMMUNITY): Payer: Self-pay

## 2021-09-17 VITALS — BP 160/96 | HR 86 | Temp 98.3°F | Resp 18

## 2021-09-17 DIAGNOSIS — D539 Nutritional anemia, unspecified: Secondary | ICD-10-CM

## 2021-09-17 DIAGNOSIS — D649 Anemia, unspecified: Secondary | ICD-10-CM | POA: Insufficient documentation

## 2021-09-17 DIAGNOSIS — E538 Deficiency of other specified B group vitamins: Secondary | ICD-10-CM | POA: Insufficient documentation

## 2021-09-17 LAB — FLOW CYTOMETRY

## 2021-09-17 MED ORDER — CYANOCOBALAMIN 1000 MCG/ML IJ SOLN
1000.0000 ug | Freq: Once | INTRAMUSCULAR | Status: AC
  Administered 2021-09-17: 1000 ug via INTRAMUSCULAR
  Filled 2021-09-17: qty 1

## 2021-09-17 NOTE — Patient Instructions (Signed)
[facility] [place]  Discharge Instructions: Thank you for choosing [facility] to provide your oncology and hematology care.  If you have a lab appointment with the [facility], please come in thru the Main Entrance and check in at the main information desk.  Wear comfortable clothing and clothing appropriate for easy access to any Portacath or PICC line.   We strive to give you quality time with your provider. You may need to reschedule your appointment if you arrive late (15 or more minutes).  Arriving late affects you and other patients whose appointments are after yours.  Also, if you miss three or more appointments without notifying the office, you may be dismissed from the clinic at the provider's discretion.      For prescription refill requests, have your pharmacy contact our office and allow 72 hours for refills to be completed.    Today you received the following B12 injection. Return as scheduled.   To help prevent nausea and vomiting after your treatment, we encourage you to take your nausea medication as directed.  BELOW ARE SYMPTOMS THAT SHOULD BE REPORTED IMMEDIATELY: *FEVER GREATER THAN 100.4 F (38 C) OR HIGHER *CHILLS OR SWEATING *NAUSEA AND VOMITING THAT IS NOT CONTROLLED WITH YOUR NAUSEA MEDICATION *UNUSUAL SHORTNESS OF BREATH *UNUSUAL BRUISING OR BLEEDING *URINARY PROBLEMS (pain or burning when urinating, or frequent urination) *BOWEL PROBLEMS (unusual diarrhea, constipation, pain near the anus) TENDERNESS IN MOUTH AND THROAT WITH OR WITHOUT PRESENCE OF ULCERS (sore throat, sores in mouth, or a toothache) UNUSUAL RASH, SWELLING OR PAIN  UNUSUAL VAGINAL DISCHARGE OR ITCHING   Items with * indicate a potential emergency and should be followed up as soon as possible or go to the Emergency Department if any problems should occur.  Please show the CHEMOTHERAPY ALERT CARD or IMMUNOTHERAPY ALERT CARD at check-in to the Emergency Department and triage  nurse.  Should you have questions after your visit or need to cancel or reschedule your appointment, please contact [facility] [phone]  and follow the prompts.  Office hours are 8:00 a.m. to 4:30 p.m. Monday - Friday. Please note that voicemails left after 4:00 p.m. may not be returned until the following business day.  We are closed weekends and major holidays. You have access to a nurse at all times for urgent questions. Please call the main number to the clinic [phone] and follow the prompts.  For any non-urgent questions, you may also contact your provider using MyChart. We now offer e-Visits for anyone 12 and older to request care online for non-urgent symptoms. For details visit [email or web address].   Also download the MyChart app! Go to the app store, search "MyChart", open the app, select [place], and log in with your MyChart username and password.  Due to Covid, a mask is required upon entering the hospital/clinic. If you do not have a mask, one will be given to you upon arrival. For doctor visits, patients may have 1 support person aged 65 or older with them. For treatment visits, patients cannot have anyone with them due to current Covid guidelines and our immunocompromised population.

## 2021-09-17 NOTE — Progress Notes (Signed)
Patient tolerated injection with no complaints voiced. Site clean and dry with no bruising or swelling noted at site. See MAR for details. Band aid applied.  Patient stable during and after injection. VSS with discharge and left in satisfactory condition with no s/s of distress noted.  

## 2021-09-28 ENCOUNTER — Telehealth: Payer: Self-pay

## 2021-09-28 NOTE — Telephone Encounter (Signed)
Please advise 

## 2021-09-28 NOTE — Telephone Encounter (Signed)
Patient stopped by the office asking if these 2 medicines okay to take?   OTC Prostate - soft gels ([facility])  2.    Red Boost - blood flow support supplement (ordered online)  Please call patient back # [phone]

## 2021-09-29 NOTE — Telephone Encounter (Signed)
Patient aware.

## 2021-10-08 ENCOUNTER — Inpatient Hospital Stay (HOSPITAL_COMMUNITY): Payer: Medicare HMO | Attending: Hematology

## 2021-10-08 DIAGNOSIS — G479 Sleep disorder, unspecified: Secondary | ICD-10-CM | POA: Diagnosis not present

## 2021-10-08 DIAGNOSIS — G629 Polyneuropathy, unspecified: Secondary | ICD-10-CM | POA: Diagnosis not present

## 2021-10-08 DIAGNOSIS — Z87891 Personal history of nicotine dependence: Secondary | ICD-10-CM | POA: Insufficient documentation

## 2021-10-08 DIAGNOSIS — N189 Chronic kidney disease, unspecified: Secondary | ICD-10-CM | POA: Diagnosis not present

## 2021-10-08 DIAGNOSIS — K5900 Constipation, unspecified: Secondary | ICD-10-CM | POA: Diagnosis not present

## 2021-10-08 DIAGNOSIS — D539 Nutritional anemia, unspecified: Secondary | ICD-10-CM | POA: Insufficient documentation

## 2021-10-08 DIAGNOSIS — E538 Deficiency of other specified B group vitamins: Secondary | ICD-10-CM | POA: Insufficient documentation

## 2021-10-08 DIAGNOSIS — D649 Anemia, unspecified: Secondary | ICD-10-CM | POA: Insufficient documentation

## 2021-10-08 LAB — CBC WITH DIFFERENTIAL/PLATELET
Abs Immature Granulocytes: 0.03 10*3/uL (ref 0.00–0.07)
Basophils Absolute: 0.1 10*3/uL (ref 0.0–0.1)
Basophils Relative: 1 %
Eosinophils Absolute: 0.3 10*3/uL (ref 0.0–0.5)
Eosinophils Relative: 3 %
HCT: 47.1 % (ref 39.0–52.0)
Hemoglobin: 14.8 g/dL (ref 13.0–17.0)
Immature Granulocytes: 0 %
Lymphocytes Relative: 31 %
Lymphs Abs: 3.1 10*3/uL (ref 0.7–4.0)
MCH: 27.9 pg (ref 26.0–34.0)
MCHC: 31.4 g/dL (ref 30.0–36.0)
MCV: 88.7 fL (ref 80.0–100.0)
Monocytes Absolute: 0.8 10*3/uL (ref 0.1–1.0)
Monocytes Relative: 9 %
Neutro Abs: 5.5 10*3/uL (ref 1.7–7.7)
Neutrophils Relative %: 56 %
Platelets: 295 10*3/uL (ref 150–400)
RBC: 5.31 MIL/uL (ref 4.22–5.81)
RDW: 13.4 % (ref 11.5–15.5)
WBC: 9.8 10*3/uL (ref 4.0–10.5)
nRBC: 0 % (ref 0.0–0.2)

## 2021-10-08 LAB — VITAMIN B12: Vitamin B-12: 750 pg/mL (ref 180–914)

## 2021-10-09 LAB — HOMOCYSTEINE: Homocysteine: 15.5 umol/L (ref 0.0–17.2)

## 2021-10-11 LAB — METHYLMALONIC ACID, SERUM: Methylmalonic Acid, Quantitative: 194 nmol/L (ref 0–378)

## 2021-10-12 ENCOUNTER — Emergency Department (HOSPITAL_COMMUNITY)
Admission: EM | Admit: 2021-10-12 | Discharge: 2021-10-13 | Disposition: A | Discharge status: Home / Self Care | Payer: Medicare HMO | Attending: Emergency Medicine | Admitting: Emergency Medicine

## 2021-10-12 ENCOUNTER — Other Ambulatory Visit: Payer: Self-pay

## 2021-10-12 ENCOUNTER — Encounter (HOSPITAL_COMMUNITY): Payer: Self-pay | Admitting: *Deleted

## 2021-10-12 DIAGNOSIS — Z794 Long term (current) use of insulin: Secondary | ICD-10-CM | POA: Insufficient documentation

## 2021-10-12 DIAGNOSIS — Z7982 Long term (current) use of aspirin: Secondary | ICD-10-CM | POA: Diagnosis not present

## 2021-10-12 DIAGNOSIS — N1831 Chronic kidney disease, stage 3a: Secondary | ICD-10-CM | POA: Diagnosis not present

## 2021-10-12 DIAGNOSIS — I129 Hypertensive chronic kidney disease with stage 1 through stage 4 chronic kidney disease, or unspecified chronic kidney disease: Secondary | ICD-10-CM | POA: Diagnosis not present

## 2021-10-12 DIAGNOSIS — E1122 Type 2 diabetes mellitus with diabetic chronic kidney disease: Secondary | ICD-10-CM | POA: Diagnosis not present

## 2021-10-12 DIAGNOSIS — Z87891 Personal history of nicotine dependence: Secondary | ICD-10-CM | POA: Insufficient documentation

## 2021-10-12 DIAGNOSIS — I10 Essential (primary) hypertension: Secondary | ICD-10-CM

## 2021-10-12 DIAGNOSIS — R040 Epistaxis: Secondary | ICD-10-CM

## 2021-10-12 LAB — CBC WITH DIFFERENTIAL/PLATELET
Abs Immature Granulocytes: 0.02 10*3/uL (ref 0.00–0.07)
Basophils Absolute: 0.1 10*3/uL (ref 0.0–0.1)
Basophils Relative: 1 %
Eosinophils Absolute: 0.4 10*3/uL (ref 0.0–0.5)
Eosinophils Relative: 4 %
HCT: 47.1 % (ref 39.0–52.0)
Hemoglobin: 14.9 g/dL (ref 13.0–17.0)
Immature Granulocytes: 0 %
Lymphocytes Relative: 37 %
Lymphs Abs: 3.9 10*3/uL (ref 0.7–4.0)
MCH: 29 pg (ref 26.0–34.0)
MCHC: 31.6 g/dL (ref 30.0–36.0)
MCV: 91.6 fL (ref 80.0–100.0)
Monocytes Absolute: 0.9 10*3/uL (ref 0.1–1.0)
Monocytes Relative: 9 %
Neutro Abs: 5.1 10*3/uL (ref 1.7–7.7)
Neutrophils Relative %: 49 %
Platelets: 304 10*3/uL (ref 150–400)
RBC: 5.14 MIL/uL (ref 4.22–5.81)
RDW: 13.6 % (ref 11.5–15.5)
WBC: 10.3 10*3/uL (ref 4.0–10.5)
nRBC: 0 % (ref 0.0–0.2)

## 2021-10-12 LAB — BASIC METABOLIC PANEL
Anion gap: 9 (ref 5–15)
BUN: 18 mg/dL (ref 8–23)
CO2: 23 mmol/L (ref 22–32)
Calcium: 9 mg/dL (ref 8.9–10.3)
Chloride: 104 mmol/L (ref 98–111)
Creatinine, Ser: 1.57 mg/dL — ABNORMAL HIGH (ref 0.61–1.24)
GFR, Estimated: 49 mL/min — ABNORMAL LOW (ref >60)
Glucose, Bld: 320 mg/dL — ABNORMAL HIGH (ref 70–99)
Potassium: 4.5 mmol/L (ref 3.5–5.1)
Sodium: 136 mmol/L (ref 135–145)

## 2021-10-12 NOTE — ED Triage Notes (Signed)
Pt noted elevated BP today at 199/97 and mild HA's.  Pt with nosebleed earlier today, no bleeding at present.

## 2021-10-13 NOTE — ED Provider Notes (Signed)
[facility] EMERGENCY DEPARTMENT Provider Note   CSN: [id number] Arrival date & time: 10/12/21  2011     History Chief Complaint  Patient presents with   Hypertension    Norman Peters is a 65 y.o. male.  65 year old male with medical problems of hyperlipidemia, hypertension, diabetes who presents to the emergency department today secondary to nosebleed and hypertension.  Patient states that he has spontaneous left-sided nosebleed.  He was able to remove a clot and hold pressure and it ultimately stopped.  However he checked his blood pressure after that and it was 503 systolic so his wife googled it and is that he needed to be seen at the emergency department immediately.  He has no other symptoms.  Nosebleed is stopped for many hours.  Has a history of hypertension and is on Norvasc without any recent changes.  No recent illnesses.   Hypertension      Past Medical History:  Diagnosis Date   Acute left-sided low back pain with left-sided sciatica 11/04/2020   Anxiety    Arthritis    Depression 2001   Diabetes mellitus 2010   GERD (gastroesophageal reflux disease)    Hyperlipidemia    Hypertension 2005    Patient Active Problem List   Diagnosis Date Noted   B12 deficiency 07/30/2021   Macrocytic anemia 07/22/2021   Anal itching 07/19/2021   Chronic kidney disease 07/10/2021   Type 2 diabetes mellitus with diabetic neuropathy, unspecified ([place]) 07/06/2021   Seasonal allergies 09/12/2020   Nasal congestion 09/12/2020   Osteoarthritis of right knee 09/02/2020   Type 2 diabetes mellitus with stage 3a chronic kidney disease, with long-term current use of insulin ([place]) 02/05/2020   Vitamin D deficiency 01/11/2020   Obesity (BMI 30.0-34.9) 09/03/2019   Dental caries 09/03/2019   Chronic elbow pain, left 06/23/2019   Type 2 diabetes mellitus with hyperglycemia ([place]) 05/03/2019   Chest pain with moderate risk of acute coronary syndrome 09/03/2018   Mixed hyperlipidemia  05/29/2017   Spasm of thoracic back muscle 10/06/2016   Pain in joint involving multiple sites 02/12/2016   GERD (gastroesophageal reflux disease) 02/12/2016   Essential hypertension, benign 02/10/2016   Anxiety and depression 02/10/2016    Past Surgical History:  Procedure Laterality Date   COLONOSCOPY N/A 07/07/2016   Procedure: COLONOSCOPY;  Surgeon: Rogene Houston, MD;  Location: [facility] SUITE;  Service: Endoscopy;  Laterality: N/A;  1030   FINGER SURGERY Left 1974   fifth , trauma on job, had to replace/repair finger   HERNIA REPAIR  5465   x3, umbilical and bilateral hernia       Family History  Problem Relation Age of Onset   Stroke Mother     Social History   Tobacco Use   Smoking status: Former    Packs/day: 0.50    Years: 30.00    Pack years: 15.00    Types: Cigarettes    Quit date: 01/20/2011    Years since quitting: 10.7   Smokeless tobacco: Never  Vaping Use   Vaping Use: Never used  Substance Use Topics   Alcohol use: No   Drug use: No    Home Medications Prior to Admission medications   Medication Sig Start Date End Date Taking? Authorizing Provider  albuterol (VENTOLIN HFA) 108 (90 Base) MCG/ACT inhaler Inhale 2 puffs into the lungs daily as needed for wheezing or shortness of breath. 07/21/21   Fayrene Helper, MD  amLODipine (NORVASC) 10 MG tablet TAKE 1 TABLET  EVERY DAY Patient taking differently: Take 10 mg by mouth every morning. 06/24/21   Fayrene Helper, MD  aspirin EC 81 MG tablet Take 81 mg by mouth every morning. 03/08/16   Fayrene Helper, MD  busPIRone (BUSPAR) 5 MG tablet Take 5 mg by mouth 2 (two) times daily.    [provider]  chlorhexidine (PERIDEX) 0.12 % solution 15 mLs by Mouth Rinse route 2 (two) times daily. 07/30/21   [provider]  diclofenac Sodium (VOLTAREN) 1 % GEL Apply 4 g topically 2 (two) times daily.    [provider]  FLUoxetine (PROZAC) 40 MG capsule Take 40 mg by mouth daily.     [provider]  gabapentin (NEURONTIN) 100 MG capsule Take 1 capsule (100 mg total) by mouth 3 (three) times daily. 07/06/21   Noreene Larsson, NP  glucose blood test strip Use as instructed once daily testing dx e11.9 09/04/21   Fayrene Helper, MD  LANTUS SOLOSTAR 100 UNIT/ML Solostar Pen INJECT 45 UNITS UNDER THE SKIN EVERY MORNING (DOSE INCREASE) Patient taking differently: Inject 55 Units into the skin every evening. 2330 06/24/21   Fayrene Helper, MD  lisinopril (ZESTRIL) 40 MG tablet Take 40 mg by mouth daily.    [provider]  montelukast (SINGULAIR) 10 MG tablet Take 1 tablet (10 mg total) by mouth daily. Patient not taking: Reported on 09/17/2021 07/12/21 07/12/22  Allie Bossier, MD  oxymetazoline (AFRIN) 0.05 % nasal spray Place 2 sprays into both nostrils 2 (two) times daily. Equate brand    [provider]  Propylene Glycol (SYSTANE COMPLETE OP) Apply 2 drops to eye.    [provider]  rosuvastatin (CRESTOR) 5 MG tablet TAKE 1 TABLET EVERY MONDAY, WEDNESDAY AND FRIDAY (DISCONTINUE PRAVASTATIN) Patient taking differently: Take 5 mg by mouth every Monday, Wednesday, and Friday. TAKE 1 TABLET EVERY MONDAY, WEDNESDAY AND FRIDAY (DISCONTINUE PRAVASTATIN) 06/01/21   Fayrene Helper, MD  sodium chloride (OCEAN) 0.65 % SOLN nasal spray Place 2 sprays into both nostrils as needed for congestion.    [provider]    Allergies    Ibuprofen, Lisinopril, Metformin and related, and Strawberry [berry]  Review of Systems   Review of Systems  All other systems reviewed and are negative.  Physical Exam Updated Vital Signs BP (!) 163/88   Pulse 85   Temp 98 F (36.7 C) (Oral)   Resp 17   Ht 5\' 8"  (1.727 m)   Wt 98 kg   SpO2 98%   BMI 32.84 kg/m   Physical Exam Vitals and nursing note reviewed.  Constitutional:      Appearance: He is well-developed.  HENT:     Head: Normocephalic and atraumatic.     Nose: No congestion or  rhinorrhea.     Mouth/Throat:     Mouth: Mucous membranes are moist.     Pharynx: Oropharynx is clear.  Eyes:     Pupils: Pupils are equal, round, and reactive to light.  Cardiovascular:     Rate and Rhythm: Normal rate.  Pulmonary:     Effort: Pulmonary effort is normal. No respiratory distress.  Abdominal:     General: There is no distension.  Musculoskeletal:        General: Normal range of motion.     Cervical back: Normal range of motion.  Skin:    General: Skin is warm and dry.  Neurological:     General: No focal deficit present.  Mental Status: He is alert.     Cranial Nerves: No cranial nerve deficit.     Motor: No weakness.     Gait: Gait normal.    ED Results / Procedures / Treatments   Labs (all labs ordered are listed, but only abnormal results are displayed) Labs Reviewed  BASIC METABOLIC PANEL - Abnormal; Notable for the following components:      Result Value   Glucose, Bld 320 (*)    Creatinine, Ser 1.57 (*)    GFR, Estimated 49 (*)    All other components within normal limits  CBC WITH DIFFERENTIAL/PLATELET    EKG None  Radiology No results found.  Procedures Procedures   Medications Ordered in ED Medications - No data to display  ED Course  I have reviewed the triage vital signs and the nursing notes.  Pertinent labs & imaging results that were available during my care of the patient were reviewed by me and considered in my medical decision making (see chart for details).    MDM Rules/Calculators/A&P                         No obvious emergent endorgan damage secondary to the hypertension.  Nosebleed has been stopped as he has been here no indication for any management there I did not see any stigmata of recent bleeding in his nose that I could cauterize.  We will follow-up with Dr. Moshe Cipro with a hypertension log over the next week or so. Otherwise will return here for any new or worsening symptoms.  Final Clinical Impression(s) / ED  Diagnoses Final diagnoses:  Hypertension, unspecified type  Nosebleed    Rx / DC Orders ED Discharge Orders     None        Letricia Krinsky, Corene Cornea, MD 10/13/21 [phone]

## 2021-10-14 ENCOUNTER — Ambulatory Visit (INDEPENDENT_AMBULATORY_CARE_PROVIDER_SITE_OTHER): Payer: Medicare HMO | Admitting: Nurse Practitioner

## 2021-10-14 ENCOUNTER — Encounter: Payer: Self-pay | Admitting: Nurse Practitioner

## 2021-10-14 ENCOUNTER — Other Ambulatory Visit: Payer: Self-pay

## 2021-10-14 VITALS — BP 150/80 | HR 92 | Resp 17 | Ht 68.0 in | Wt 216.0 lb

## 2021-10-14 DIAGNOSIS — N1831 Chronic kidney disease, stage 3a: Secondary | ICD-10-CM

## 2021-10-14 DIAGNOSIS — I10 Essential (primary) hypertension: Secondary | ICD-10-CM | POA: Diagnosis not present

## 2021-10-14 DIAGNOSIS — J302 Other seasonal allergic rhinitis: Secondary | ICD-10-CM | POA: Diagnosis not present

## 2021-10-14 DIAGNOSIS — M1711 Unilateral primary osteoarthritis, right knee: Secondary | ICD-10-CM | POA: Diagnosis not present

## 2021-10-14 MED ORDER — MONTELUKAST SODIUM 10 MG PO TABS: 10.0000 mg | ORAL_TABLET | Freq: Every day | ORAL | 3 refills | Status: DC

## 2021-10-14 MED ORDER — DICLOFENAC SODIUM 1 % EX GEL: 4.0000 g | Freq: Two times a day (BID) | CUTANEOUS | 0 refills | Status: DC

## 2021-10-14 MED ORDER — CHLORHEXIDINE GLUCONATE 0.12 % MT SOLN: 15.0000 mL | Freq: Two times a day (BID) | OROMUCOSAL | 1 refills | Status: DC

## 2021-10-14 MED ORDER — HYDROCHLOROTHIAZIDE 12.5 MG PO TABS: 12.5000 mg | ORAL_TABLET | Freq: Every day | ORAL | 3 refills | Status: DC

## 2021-10-14 NOTE — Patient Instructions (Signed)
Please start taking hydrochlorothiazide 12.5mg  daily for your blood pressure, avoid salty food, check your blood pressure three times a week and write it down bring it with you to your next visit. Continue amlodipine 10mg  daily.  Use tylenol and diclofenac gel as needed for your knee pain Drink plenty of water.   It is important that you exercise regularly at least 30 minutes 5 times a week.  Think about what you will eat, plan ahead. Choose " clean, [place], fresh or frozen" over canned, processed or packaged foods which are more sugary, salty and fatty. 70 to 75% of food eaten should be vegetables and fruit. Three meals at set times with snacks allowed between meals, but they must be fruit or vegetables. Aim to eat over a 12 hour period , example 7 am to 7 pm, and STOP after  your last meal of the day. Drink water,generally about 64 ounces per day, no other drink is as healthy. Fruit juice is best enjoyed in a healthy way, by EATING the fruit.  Thanks for choosing [facility], we consider it a privelige to serve you.

## 2021-10-14 NOTE — Assessment & Plan Note (Addendum)
BP Readings from Last 3 Encounters:  10/14/21 (!) 150/80  10/13/21 (!) 163/88  09/17/21 (!) 160/96  recent ER visit for HTN start HTC 12.5mg  daily  Continue amlodipine 10mg  daily Avoid salt. Monitor BP at home and Keep BP log

## 2021-10-14 NOTE — Progress Notes (Signed)
   Gerber Penza     MRN: [id number]      DOB: Jan 09, 1956   HPI Mr. Noren is here for follow  up post ER visit. He was recently in the ER due to high blood pressure and nasal bleeding.   The PT denies any adverse reactions to current medications since the last visit.   No more nose bleeding. He has not been checking his BP at home.   Pt c/o chronic bilateral knee pain, walks with a cane. He has been taking tylenol and using diclofenac gel as needed.   ROS Denies recent fever or chills. Denies sinus pressure, nasal congestion, ear pain or sore throat. Denies chest congestion, productive cough or wheezing. Denies chest pains, palpitations and leg swelling Denies abdominal pain, nausea, vomiting,diarrhea or constipation.   Denies dysuria, frequency, hesitancy or incontinence. Has bilateral chronic knee pain Denies headaches, seizures, numbness, or tingling. Denies depression, anxiety or insomnia. Denies skin break down or rash.   PE  BP (!) 159/84   Pulse 92   Resp 17   Ht 5\' 8"  (1.727 m)   Wt 216 lb 0.6 oz (98 kg)   SpO2 95%   BMI 32.85 kg/m   Patient alert and oriented and in no cardiopulmonary distress.  HEENT: No facial asymmetry, EOMI,     Neck supple .  Chest: Clear to auscultation bilaterally.  CVS: S1, S2 no murmurs, no S3.Regular rate.  ABD: Soft non tender.   Ext: No edema  MS: tenderness with movement of bilateral knees uses a cane  Skin: Intact, no ulcerations or rash noted.  Psych: Good eye contact, normal affect. Memory intact not anxious or depressed appearing.  CNS: CN 2-12 intact, power,  normal throughout.no focal deficits noted.   Assessment & Plan

## 2021-10-14 NOTE — Progress Notes (Signed)
[place], [place] [address]   CLINIC:  Medical Oncology/Hematology  PCP:  Fayrene Helper, MD [address] / [place] [address]  [phone]  REASON FOR VISIT:  Follow-up for anemia and vitamin B-12 deficiency  PRIOR THERAPY: none  CURRENT THERAPY: monthly vitamin B-12 injections  INTERVAL HISTORY:  [name], a 65 y.o. male, returns for routine follow-up for his anemia and vitamin B-12 deficiency. Benyamin was last seen on 08/20/2021.  Today he reports feeling good. He continues to receive vitamin B-12 injections every 4 weeks. He is no longer taking Gabapentin; he reports continued numbness in his toes.    REVIEW OF SYSTEMS:  Review of Systems  Constitutional:  Negative for fatigue.  HENT:   Positive for nosebleeds.   Respiratory:  Positive for shortness of breath.   Gastrointestinal:  Positive for constipation.  Genitourinary:  Positive for frequency and nocturia.   Neurological:  Positive for numbness (toes).  Psychiatric/Behavioral:  Positive for sleep disturbance.   All other systems reviewed and are negative.  PAST MEDICAL/SURGICAL HISTORY:  Past Medical History:  Diagnosis Date   Acute left-sided low back pain with left-sided sciatica 11/04/2020   Anxiety    Arthritis    Depression 2001   Diabetes mellitus 2010   GERD (gastroesophageal reflux disease)    Hyperlipidemia    Hypertension 2005   Past Surgical History:  Procedure Laterality Date   COLONOSCOPY N/A 07/07/2016   Procedure: COLONOSCOPY;  Surgeon: Rogene Houston, MD;  Location: [facility];  Service: Endoscopy;  Laterality: N/A;  1030   FINGER SURGERY Left 1974   fifth , trauma on job, had to replace/repair finger   HERNIA REPAIR  3710   x3, umbilical and bilateral hernia    SOCIAL HISTORY:  Social History   Socioeconomic History   Marital status: Married    Spouse name: Not on file   Number of children: Not on file   Years  of education: Not on file   Highest education level: Not on file  Occupational History   Not on file  Tobacco Use   Smoking status: Former    Packs/day: 0.50    Years: 30.00    Pack years: 15.00    Types: Cigarettes    Quit date: 01/20/2011    Years since quitting: 10.7   Smokeless tobacco: Never  Vaping Use   Vaping Use: Never used  Substance and Sexual Activity   Alcohol use: No   Drug use: No   Sexual activity: Not Currently  Other Topics Concern   Not on file  Social History Narrative   Not on file   Social Determinants of Health   Financial Resource Strain: Low Risk    Difficulty of Paying Living Expenses: Not hard at all  Food Insecurity: No Food Insecurity   Worried About Charity fundraiser in the Last Year: Never true   [place] in the Last Year: Never true  Transportation Needs: No Transportation Needs   Lack of Transportation (Medical): No   Lack of Transportation (Non-Medical): No  Physical Activity: Insufficiently Active   Days of Exercise per Week: 5 days   Minutes of Exercise per Session: 20 min  Stress: No Stress Concern Present   Feeling of Stress : Not at all  Social Connections: Socially Integrated   Frequency of Communication with Friends and Family: More than three times a week   Frequency  of Social Gatherings with Friends and Family: Once a week   Attends Religious Services: More than 4 times per year   Active Member of [employer] or Organizations: Yes   Attends Music therapist: More than 4 times per year   Marital Status: Married  Human resources officer Violence: Not At Risk   Fear of Current or Ex-Partner: No   Emotionally Abused: No   Physically Abused: No   Sexually Abused: No    FAMILY HISTORY:  Family History  Problem Relation Age of Onset   Stroke Mother     CURRENT MEDICATIONS:  Current Outpatient Medications  Medication Sig Dispense Refill   acetaminophen (TYLENOL) 325 MG tablet Take 650 mg by mouth every 6 (six)  hours as needed.     albuterol (VENTOLIN HFA) 108 (90 Base) MCG/ACT inhaler Inhale 2 puffs into the lungs daily as needed for wheezing or shortness of breath. 1 each 1   amLODipine (NORVASC) 10 MG tablet TAKE 1 TABLET EVERY DAY (Patient taking differently: Take 10 mg by mouth every morning.) 90 tablet 1   aspirin EC 81 MG tablet Take 81 mg by mouth every morning.     busPIRone (BUSPAR) 5 MG tablet Take 5 mg by mouth 2 (two) times daily.     diclofenac Sodium (VOLTAREN) 1 % GEL Apply 4 g topically 2 (two) times daily. 2 g 0   FLUoxetine (PROZAC) 40 MG capsule Take 40 mg by mouth daily.     glucose blood test strip Use as instructed once daily testing dx e11.9 100 each 3   hydrochlorothiazide (HYDRODIURIL) 12.5 MG tablet Take 1 tablet (12.5 mg total) by mouth daily. 90 tablet 3   LANTUS SOLOSTAR 100 UNIT/ML Solostar Pen INJECT 45 UNITS UNDER THE SKIN EVERY MORNING (DOSE INCREASE) (Patient taking differently: Inject 55 Units into the skin every evening. 2330) 45 mL 3   montelukast (SINGULAIR) 10 MG tablet Take 1 tablet (10 mg total) by mouth daily. 30 tablet 3   oxymetazoline (AFRIN) 0.05 % nasal spray Place 2 sprays into both nostrils 2 (two) times daily. Equate brand     rosuvastatin (CRESTOR) 5 MG tablet TAKE 1 TABLET EVERY MONDAY, WEDNESDAY AND FRIDAY (DISCONTINUE PRAVASTATIN) 36 tablet 3   gabapentin (NEURONTIN) 100 MG capsule Take 1 capsule (100 mg total) by mouth 3 (three) times daily. (Patient not taking: Reported on 10/14/2021) 90 capsule 3   Propylene Glycol (SYSTANE COMPLETE OP) Apply 2 drops to eye. (Patient not taking: Reported on 10/15/2021)     sodium chloride (OCEAN) 0.65 % SOLN nasal spray Place 2 sprays into both nostrils as needed for congestion. (Patient not taking: Reported on 10/14/2021)     No current facility-administered medications for this visit.    ALLERGIES:  Allergies  Allergen Reactions   Ibuprofen Rash   Lisinopril Cough   Metformin And Related Cough   Strawberry  [Berry] Hives and Swelling    TONGUE SWELLING    PHYSICAL EXAM:  Performance status (ECOG): 1 - Symptomatic but completely ambulatory  Vitals:   10/15/21 1336  BP: (!) 157/88  Pulse: 89  Resp: 20  Temp: 98 F (36.7 C)  SpO2: 94%   Wt Readings from Last 3 Encounters:  10/15/21 215 lb 13.3 oz (97.9 kg)  10/14/21 216 lb 0.6 oz (98 kg)  10/12/21 216 lb (98 kg)   Physical Exam Vitals reviewed.  Constitutional:      Appearance: Normal appearance. He is obese.  Cardiovascular:     Rate  and Rhythm: Normal rate and regular rhythm.     Pulses: Normal pulses.     Heart sounds: Normal heart sounds.  Pulmonary:     Effort: Pulmonary effort is normal.     Breath sounds: Normal breath sounds.  Neurological:     General: No focal deficit present.     Mental Status: He is alert and oriented to person, place, and time.  Psychiatric:        Mood and Affect: Mood normal.        Behavior: Behavior normal.    LABORATORY DATA:  I have reviewed the labs as listed.  CBC Latest Ref Rng & Units 10/12/2021 10/08/2021 08/20/2021  WBC 4.0 - 10.5 K/uL 10.3 9.8 13.9(H)  Hemoglobin 13.0 - 17.0 g/dL 14.9 14.8 11.9(L)  Hematocrit 39.0 - 52.0 % 47.1 47.1 37.9(L)  Platelets 150 - 400 K/uL 304 295 259   CMP Latest Ref Rng & Units 10/12/2021 08/07/2021 07/18/2021  Glucose 70 - 99 mg/dL 320(H) 134(H) 174(H)  BUN 8 - 23 mg/dL 18 16 37(H)  Creatinine 0.61 - 1.24 mg/dL 1.57(H) 1.36(H) 2.13(H)  Sodium 135 - 145 mmol/L 136 138 135  Potassium 3.5 - 5.1 mmol/L 4.5 4.4 5.2(H)  Chloride 98 - 111 mmol/L 104 106 104  CO2 22 - 32 mmol/L 23 26 25   Calcium 8.9 - 10.3 mg/dL 9.0 9.4 8.7(L)  Total Protein 6.0 - 8.5 g/dL - - -  Total Bilirubin 0.0 - 1.2 mg/dL - - -  Alkaline Phos 44 - 121 IU/L - - -  AST 0 - 40 IU/L - - -  ALT 0 - 44 IU/L - - -      Component Value Date/Time   RBC 5.14 10/12/2021 2205   MCV 91.6 10/12/2021 2205   MCV 107 (H) 07/17/2021 1058   MCH 29.0 10/12/2021 2205   MCHC 31.6 10/12/2021  2205   RDW 13.6 10/12/2021 2205   RDW 20.7 (H) 07/17/2021 1058   LYMPHSABS 3.9 10/12/2021 2205   LYMPHSABS 3.1 07/17/2021 1058   MONOABS 0.9 10/12/2021 2205   EOSABS 0.4 10/12/2021 2205   EOSABS 0.5 (H) 07/17/2021 1058   BASOSABS 0.1 10/12/2021 2205   BASOSABS 0.0 07/17/2021 1058    DIAGNOSTIC IMAGING:  I have independently reviewed the scans and discussed with the patient. No results found.   ASSESSMENT:  Macrocytic anemia: - Severe macrocytic anemia on 07/10/2021 with hemoglobin 6.9. - Required 2 units of RBC in the last 2 weeks. - Colonoscopy on 07/07/2016 with 5 mm rectosigmoid hyperplastic polyp removed. - CTAP on 03/07/2021 with spleen size normal. - Has occasional blood on tissue when constipated, most likely from external hemorrhoids.  Denies any fevers, night sweats or weight loss.   Social/family history: - He lives at home with his wife.  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  No exposure to chemicals.  He smoked 1 pack/day for 40 years.  Quit smoking in 2012. - No family history of cancers or leukemias.   PLAN:  Severe macrocytic anemia secondary to vitamin B12 deficiency: -He had intrinsic factor and parietal cell antibodies were high indicating pernicious anemia. - Reviewed recent labs which showed vitamin B12 750.  Homocysteine and methylmalonic acid were normal. - Hemoglobin normalized to 14.9. - Recommend continuing vitamin B12 every 4 weeks injections. - RTC 4 months with repeat labs including MMA.   Peripheral neuropathy: -Has neuropathy in the fingertips  and toes on and off. - He is now off of gabapentin.   CKD: -He has CKD since 2017 with baseline creatinine 1.5 and stable. - This is also contributing to his anemia.  He  Orders placed this encounter:  No orders of the defined types were placed in this encounter.    Derek Jack, MD [place] [phone]   I, Thana Ates, am acting as a scribe for Dr. Derek Jack.  I, Derek Jack MD, have reviewed the above documentation for accuracy and completeness, and I agree with the above.

## 2021-10-14 NOTE — Assessment & Plan Note (Signed)
Avoid NSAID

## 2021-10-14 NOTE — Assessment & Plan Note (Signed)
Continue tylenol and diclofenac gel as needed.  Avoid NSAIDS due to CKD [facility] referral

## 2021-10-15 ENCOUNTER — Encounter (HOSPITAL_COMMUNITY): Payer: Self-pay | Admitting: Hematology

## 2021-10-15 ENCOUNTER — Other Ambulatory Visit (HOSPITAL_COMMUNITY): Payer: Medicare HMO

## 2021-10-15 ENCOUNTER — Inpatient Hospital Stay (HOSPITAL_BASED_OUTPATIENT_CLINIC_OR_DEPARTMENT_OTHER): Payer: Medicare HMO | Admitting: Hematology

## 2021-10-15 ENCOUNTER — Inpatient Hospital Stay (HOSPITAL_COMMUNITY): Payer: Medicare HMO

## 2021-10-15 ENCOUNTER — Other Ambulatory Visit: Payer: Self-pay

## 2021-10-15 VITALS — BP 157/88 | HR 89 | Temp 98.0°F | Resp 20 | Ht 68.9 in | Wt 215.8 lb

## 2021-10-15 DIAGNOSIS — K5900 Constipation, unspecified: Secondary | ICD-10-CM | POA: Diagnosis not present

## 2021-10-15 DIAGNOSIS — E538 Deficiency of other specified B group vitamins: Secondary | ICD-10-CM

## 2021-10-15 DIAGNOSIS — G629 Polyneuropathy, unspecified: Secondary | ICD-10-CM | POA: Diagnosis not present

## 2021-10-15 DIAGNOSIS — D539 Nutritional anemia, unspecified: Secondary | ICD-10-CM

## 2021-10-15 DIAGNOSIS — Z87891 Personal history of nicotine dependence: Secondary | ICD-10-CM | POA: Diagnosis not present

## 2021-10-15 DIAGNOSIS — N189 Chronic kidney disease, unspecified: Secondary | ICD-10-CM | POA: Diagnosis not present

## 2021-10-15 DIAGNOSIS — G479 Sleep disorder, unspecified: Secondary | ICD-10-CM | POA: Diagnosis not present

## 2021-10-15 MED ORDER — CYANOCOBALAMIN 1000 MCG/ML IJ SOLN
1000.0000 ug | Freq: Once | INTRAMUSCULAR | Status: AC
  Administered 2021-10-15: 1000 ug via INTRAMUSCULAR
  Filled 2021-10-15: qty 1

## 2021-10-15 NOTE — Patient Instructions (Signed)
[place] at [facility] Discharge Instructions  You were seen and examined today by Dr. Delton Coombes. Proceed with injection today and continue those monthly for the remainder of your life. Dr. Delton Coombes will see you again in 4 months.   Thank you for choosing [place] at [facility] to provide your oncology and hematology care.  To afford each patient quality time with our provider, please arrive at least 15 minutes before your scheduled appointment time.   If you have a lab appointment with the [facility] please come in thru the Main Entrance and check in at the main information desk.  You need to re-schedule your appointment should you arrive 10 or more minutes late.  We strive to give you quality time with our providers, and arriving late affects you and other patients whose appointments are after yours.  Also, if you no show three or more times for appointments you may be dismissed from the clinic at the providers discretion.     Again, thank you for choosing [facility]  Our hope is that these requests will decrease the amount of time that you wait before being seen by our physicians.       _____________________________________________________________  Should you have questions after your visit to [facility], please contact our office at [phone] and follow the prompts.  Our office hours are 8:00 a.m. and 4:30 p.m. Monday - Friday.  Please note that voicemails left after 4:00 p.m. may not be returned until the following business day.  We are closed weekends and major holidays.  You do have access to a nurse 24-7, just call the main number to the clinic [phone] and do not press any options, hold on the line and a nurse will answer the phone.    For prescription refill requests, have your pharmacy contact our office and allow 72 hours.    Due to Covid, you will need to wear a mask upon entering the  hospital. If you do not have a mask, a mask will be given to you at the Main Entrance upon arrival. For doctor visits, patients may have 1 support person age 63 or older with them. For treatment visits, patients can not have anyone with them due to social distancing guidelines and our immunocompromised population.

## 2021-10-21 ENCOUNTER — Other Ambulatory Visit: Payer: Self-pay

## 2021-10-21 ENCOUNTER — Ambulatory Visit (INDEPENDENT_AMBULATORY_CARE_PROVIDER_SITE_OTHER): Payer: Medicare HMO | Admitting: Nurse Practitioner

## 2021-10-21 ENCOUNTER — Encounter: Payer: Self-pay | Admitting: Nurse Practitioner

## 2021-10-21 VITALS — BP 148/77 | HR 106 | Ht 68.0 in | Wt 215.6 lb

## 2021-10-21 DIAGNOSIS — E1122 Type 2 diabetes mellitus with diabetic chronic kidney disease: Secondary | ICD-10-CM

## 2021-10-21 DIAGNOSIS — E782 Mixed hyperlipidemia: Secondary | ICD-10-CM

## 2021-10-21 DIAGNOSIS — I10 Essential (primary) hypertension: Secondary | ICD-10-CM | POA: Diagnosis not present

## 2021-10-21 DIAGNOSIS — Z794 Long term (current) use of insulin: Secondary | ICD-10-CM

## 2021-10-21 DIAGNOSIS — N1831 Chronic kidney disease, stage 3a: Secondary | ICD-10-CM | POA: Diagnosis not present

## 2021-10-21 LAB — POCT GLYCOSYLATED HEMOGLOBIN (HGB A1C): HbA1c, POC (controlled diabetic range): 7.8 % — AB (ref 0.0–7.0)

## 2021-10-21 NOTE — Patient Instructions (Signed)

## 2021-10-21 NOTE — Progress Notes (Signed)
Endocrinology follow-up note    10/21/2021, 10:24 AM   Subjective:    Patient ID: Norman Peters, male    DOB: 01/01/1956.  Norman Peters is being seen in follow-up after he was seen in consultation for management of currently uncontrolled symptomatic diabetes requested by  Fayrene Helper, MD.   Past Medical History:  Diagnosis Date   Acute left-sided low back pain with left-sided sciatica 11/04/2020   Anxiety    Arthritis    Depression 2001   Diabetes mellitus 2010   GERD (gastroesophageal reflux disease)    Hyperlipidemia    Hypertension 2005    Past Surgical History:  Procedure Laterality Date   COLONOSCOPY N/A 07/07/2016   Procedure: COLONOSCOPY;  Surgeon: Rogene Houston, MD;  Location: [facility];  Service: Endoscopy;  Laterality: N/A;  1030   FINGER SURGERY Left 1974   fifth , trauma on job, had to replace/repair finger   HERNIA REPAIR  6599   x3, umbilical and bilateral hernia    Social History   Socioeconomic History   Marital status: Married    Spouse name: Not on file   Number of children: Not on file   Years of education: Not on file   Highest education level: Not on file  Occupational History   Not on file  Tobacco Use   Smoking status: Former    Packs/day: 0.50    Years: 30.00    Pack years: 15.00    Types: Cigarettes    Quit date: 01/20/2011    Years since quitting: 10.7   Smokeless tobacco: Never  Vaping Use   Vaping Use: Never used  Substance and Sexual Activity   Alcohol use: No   Drug use: No   Sexual activity: Not Currently  Other Topics Concern   Not on file  Social History Narrative   Not on file   Social Determinants of Health   Financial Resource Strain: Low Risk    Difficulty of Paying Living Expenses: Not hard at all  Food Insecurity: No Food Insecurity   Worried About Charity fundraiser in the Last Year: Never true   [place] in  the Last Year: Never true  Transportation Needs: No Transportation Needs   Lack of Transportation (Medical): No   Lack of Transportation (Non-Medical): No  Physical Activity: Insufficiently Active   Days of Exercise per Week: 5 days   Minutes of Exercise per Session: 20 min  Stress: No Stress Concern Present   Feeling of Stress : Not at all  Social Connections: Socially Integrated   Frequency of Communication with Friends and Family: More than three times a week   Frequency of Social Gatherings with Friends and Family: Once a week   Attends Religious Services: More than 4 times per year   Active Member of [employer] or Organizations: Yes   Attends Archivist Meetings: More than 4 times per year   Marital Status: Married    Family History  Problem Relation Age of Onset   Stroke Mother     Outpatient Encounter Medications as of 10/21/2021  Medication Sig  acetaminophen (TYLENOL) 325 MG tablet Take 650 mg by mouth every 6 (six) hours as needed.   albuterol (VENTOLIN HFA) 108 (90 Base) MCG/ACT inhaler Inhale 2 puffs into the lungs daily as needed for wheezing or shortness of breath.   amLODipine (NORVASC) 10 MG tablet TAKE 1 TABLET EVERY DAY (Patient taking differently: Take 10 mg by mouth every morning.)   aspirin EC 81 MG tablet Take 81 mg by mouth every morning.   busPIRone (BUSPAR) 5 MG tablet Take 5 mg by mouth 2 (two) times daily.   diclofenac Sodium (VOLTAREN) 1 % GEL Apply 4 g topically 2 (two) times daily.   FLUoxetine (PROZAC) 40 MG capsule Take 40 mg by mouth daily.   glucose blood test strip Use as instructed once daily testing dx e11.9   hydrochlorothiazide (HYDRODIURIL) 12.5 MG tablet Take 1 tablet (12.5 mg total) by mouth daily.   LANTUS SOLOSTAR 100 UNIT/ML Solostar Pen INJECT 45 UNITS UNDER THE SKIN EVERY MORNING (DOSE INCREASE) (Patient taking differently: Inject 55 Units into the skin every evening. 2330)   montelukast (SINGULAIR) 10 MG tablet Take 1 tablet (10  mg total) by mouth daily.   oxymetazoline (AFRIN) 0.05 % nasal spray Place 2 sprays into both nostrils 2 (two) times daily. Equate brand   rosuvastatin (CRESTOR) 5 MG tablet TAKE 1 TABLET EVERY MONDAY, WEDNESDAY AND FRIDAY (DISCONTINUE PRAVASTATIN)   sodium chloride (OCEAN) 0.65 % SOLN nasal spray Place 2 sprays into both nostrils as needed for congestion.   [DISCONTINUED] gabapentin (NEURONTIN) 100 MG capsule Take 1 capsule (100 mg total) by mouth 3 (three) times daily. (Patient not taking: Reported on 10/14/2021)   [DISCONTINUED] Propylene Glycol (SYSTANE COMPLETE OP) Apply 2 drops to eye. (Patient not taking: Reported on 10/15/2021)   No facility-administered encounter medications on file as of 10/21/2021.    ALLERGIES: Allergies  Allergen Reactions   Ibuprofen Rash   Lisinopril Cough   Metformin And Related Cough   Strawberry [Berry] Hives and Swelling    TONGUE SWELLING    VACCINATION STATUS: Immunization History  Administered Date(s) Administered   Fluad Quad(high Dose 65+) 09/02/2020, 07/17/2021   Influenza,inj,Quad PF,6+ Mos 07/14/2016, 08/25/2017, 08/30/2018, 07/02/2019   PFIZER(Purple Top)SARS-COV-2 Vaccination 01/25/2020, 02/19/2020, 09/28/2020, 05/29/2021   PNEUMOCOCCAL CONJUGATE-20 07/17/2021   Pfizer Covid-19 Vaccine Bivalent Booster 48yrs & up 09/25/2021   Pneumococcal Polysaccharide-23 02/10/2016   Tdap 07/04/2021   Zoster Recombinat (Shingrix) 07/08/2021    Diabetes He presents for his follow-up diabetic visit. He has type 2 diabetes mellitus. Onset time: He was diagnosed at approximate age of 10 years. His disease course has been improving. There are no hypoglycemic associated symptoms. Pertinent negatives for hypoglycemia include no confusion, headaches, pallor or seizures. Associated symptoms include fatigue. Pertinent negatives for diabetes include no blurred vision, no chest pain, no polydipsia, no polyphagia, no polyuria, no weakness and no weight loss. There  are no hypoglycemic complications. Symptoms are improving. Diabetic complications include nephropathy and peripheral neuropathy. Risk factors for coronary artery disease include diabetes mellitus, dyslipidemia, obesity, hypertension, male sex, tobacco exposure and sedentary lifestyle. Current diabetic treatment includes insulin injections. He is compliant with treatment most of the time. His weight is fluctuating minimally. He is following a generally healthy diet. When asked about meal planning, he reported none. He has had a previous visit with a dietitian. He rarely participates in exercise. His home blood glucose trend is fluctuating minimally. His breakfast blood glucose range is generally 90-110 mg/dl. His bedtime blood glucose range is generally  130-140 mg/dl. (He presents today with his logs, no meter, showing improved, at goal fasting and postprandial glycemic profile.  His POCT A1c today is 7.8%, improving drastically from previous visit of 11.7%.  He continues to work hard on diet and exercise which has helped him manage his diabetes.  He denies any significant hypoglycemia.) An ACE inhibitor/angiotensin II receptor blocker is not being taken. He does not see a podiatrist.Eye exam is current.  Hyperlipidemia This is a chronic problem. The current episode started more than 1 year ago. The problem is uncontrolled. Recent lipid tests were reviewed and are variable. Exacerbating diseases include chronic renal disease, diabetes and obesity. Factors aggravating his hyperlipidemia include fatty foods. Pertinent negatives include no chest pain, myalgias or shortness of breath. Current antihyperlipidemic treatment includes statins. The current treatment provides mild improvement of lipids. Compliance problems include adherence to exercise and adherence to diet.  Risk factors for coronary artery disease include diabetes mellitus, dyslipidemia, male sex, obesity, hypertension and a sedentary lifestyle.   Hypertension This is a chronic problem. The current episode started more than 1 year ago. The problem is unchanged. The problem is controlled. Pertinent negatives include no blurred vision, chest pain, headaches, neck pain, palpitations or shortness of breath. There are no associated agents to hypertension. Risk factors for coronary artery disease include diabetes mellitus, male gender, smoking/tobacco exposure, sedentary lifestyle, obesity and dyslipidemia. Past treatments include calcium channel blockers and diuretics. The current treatment provides mild improvement. There are no compliance problems.  Hypertensive end-organ damage includes kidney disease. Identifiable causes of hypertension include chronic renal disease.   Review of systems  Constitutional: + Minimally fluctuating body weight,  current Body mass index is 32.78 kg/m. , + fatigue, no subjective hyperthermia, no subjective hypothermia Eyes: no blurry vision, no xerophthalmia ENT: no sore throat, no nodules palpated in throat, no dysphagia/odynophagia, no hoarseness Cardiovascular: no chest pain, no shortness of breath, no palpitations, no leg swelling Respiratory: no cough, no shortness of breath Gastrointestinal: no nausea/vomiting/diarrhea Musculoskeletal: generalized aches and pains- taking OTC Tylenol and seeing chiropractor, walks with cane Skin: no rashes, no hyperemia, has dark ring around his left foot Neurological: no tremors, + numbness/tingling to LLE, no dizziness Psychiatric: no depression, no anxiety   Objective:    BP (!) 148/77    Pulse (!) 106    Ht 5\' 8"  (1.727 m)    Wt 215 lb 9.6 oz (97.8 kg)    BMI 32.78 kg/m   Wt Readings from Last 3 Encounters:  10/21/21 215 lb 9.6 oz (97.8 kg)  10/15/21 215 lb 13.3 oz (97.9 kg)  10/14/21 216 lb 0.6 oz (98 kg)    BP Readings from Last 3 Encounters:  10/21/21 (!) 148/77  10/15/21 (!) 157/88  10/14/21 (!) 150/80     Physical Exam- Limited  Constitutional:   Body mass index is 32.78 kg/m. , not in acute distress, normal state of mind Eyes:  EOMI, no exophthalmos Neck: Supple Cardiovascular: RRR, no murmurs, rubs, or gallops, no edema Respiratory: Adequate breathing efforts, no crackles, rales, rhonchi, or wheezing Musculoskeletal: no gross deformities, strength intact in all four extremities, no gross restriction of joint movements, walks with cane Skin:  no rashes, no hyperemia Neurological: no tremor with outstretched hands     CMP     Component Value Date/Time   NA 136 10/12/2021 2205   NA 136 07/17/2021 1058   K 4.5 10/12/2021 2205   CL 104 10/12/2021 2205   CO2 23 10/12/2021 2205  GLUCOSE 320 (H) 10/12/2021 2205   BUN 18 10/12/2021 2205   BUN 38 (H) 07/17/2021 1058   CREATININE 1.57 (H) 10/12/2021 2205   CREATININE 1.47 (H) 08/25/2020 0927   CALCIUM 9.0 10/12/2021 2205   PROT 7.7 07/17/2021 1058   ALBUMIN 4.3 07/17/2021 1058   AST 60 (H) 07/17/2021 1058   ALT 23 07/17/2021 1058   ALKPHOS 63 07/17/2021 1058   BILITOT 1.4 (H) 07/17/2021 1058   GFRNONAA 49 (L) 10/12/2021 2205   GFRNONAA 50 (L) 08/25/2020 0927   GFRAA 61 12/10/2020 0933   GFRAA 58 (L) 08/25/2020 0927     Diabetic Labs (most recent): Lab Results  Component Value Date   HGBA1C 7.8 (A) 10/21/2021   HGBA1C 11.7 (A) 06/17/2021   HGBA1C 8.5 (A) 03/16/2021     Lipid Panel     Component Value Date/Time   CHOL 132 06/10/2021 0844   TRIG 158 (H) 06/10/2021 0844   HDL 28 (L) 06/10/2021 0844   CHOLHDL 3.2 12/10/2020 0933   CHOLHDL 3.4 08/25/2020 0927   VLDL 39 (H) 05/14/2017 0831   LDLCALC 76 06/10/2021 0844   LDLCALC 55 08/25/2020 0927   LABVLDL 28 06/10/2021 0844      Lab Results  Component Value Date   TSH 2.215 07/22/2021   TSH 2.100 06/10/2021   TSH 2.79 08/25/2020   TSH 1.96 08/24/2019   TSH 1.34 01/31/2018   TSH 1.61 02/10/2016   FREET4 1.14 06/10/2021   FREET4 1.0 08/25/2020     Assessment & Plan:   1) Type 2 diabetes mellitus  with stage 3a chronic kidney disease, with long-term current use of insulin ([place])  - Norman Peters has currently uncontrolled symptomatic type 2 DM since 65 years of age.  He presents today with his logs, no meter, showing improved, at goal fasting and postprandial glycemic profile.  His POCT A1c today is 7.8%, improving drastically from previous visit of 11.7%.  He continues to work hard on diet and exercise which has helped him manage his diabetes.  He denies any significant hypoglycemia.   Recent labs reviewed.  - I had a long discussion with him about the progressive nature of diabetes and the pathology behind its complications. -his diabetes is complicated by CKD and he remains at a high risk for more acute and chronic complications which include CAD, CVA, CKD, retinopathy, and neuropathy. These are all discussed in detail with him.  - Nutritional counseling repeated at each appointment due to patients tendency to fall back in to old habits.  - The patient admits there is a room for improvement in their diet and drink choices. -  Suggestion is made for the patient to avoid simple carbohydrates from their diet including Cakes, Sweet Desserts / Pastries, Ice Cream, Soda (diet and regular), Sweet Tea, Candies, Chips, Cookies, Sweet Pastries, Store Bought Juices, Alcohol in Excess of 1-2 drinks a day, Artificial Sweeteners, Coffee Creamer, and "Sugar-free" Products. This will help patient to have stable blood glucose profile and potentially avoid unintended weight gain.   - I encouraged the patient to switch to unprocessed or minimally processed complex starch and increased protein intake (animal or plant source), fruits, and vegetables.   - Patient is advised to stick to a routine mealtimes to eat 3 meals a day and avoid unnecessary snacks (to snack only to correct hypoglycemia).  - I have approached him with the following individualized plan to manage his diabetes and patient agrees:   - he  will continue  to benefit from basal insulin.    -Given his stable at goal glycemic profile, he is advised to continue his Lantus 55 units SQ nightly.  He is to avoid short acting insulin at this time.    -He is encouraged to continue monitoring blood glucose twice daily, before breakfast and before bed, and to call the clinic if he has readings less than 70 or greater than 200 for 3 tests in a row.  - he is warned not to take insulin without proper monitoring per orders.  -He reports that he had developed unexplained cough while taking Glipizide and Metformin.  After he finished his Glipizide and Metformin, reportedly the cough has improved.  He is advised to stay off of Glipizide and Metformin for now.   - he will be considered for incretin therapy as appropriate next visit.  - Specific targets for  A1c;  LDL, HDL,  and Triglycerides were discussed with the patient.  2) Blood Pressure /Hypertension:  His blood pressure is not controlled to target but is improving.  he is advised to continue his current medications including Amlodipine 10 mg p.o. daily with breakfast and HCTZ 12.5 mg po daily.  3) Lipids/Hyperlipidemia:   His most recent lipid panel from 06/10/21 shows controlled LDL of 76 and elevated triglycerides of 158 (improving).  He is advised to continue Crestor 5 mg po every other day at bedtime and Zetia 10 mg po daily.  Side effects and precautions discussed with him.   4)  Weight/Diet:  His Body mass index is 32.78 kg/m.  -   clearly complicating his diabetes care.   he is a candidate for weight loss. I discussed with him the fact that loss of 5 - 10% of his  current body weight will have the most impact on his diabetes management.  Exercise, and detailed carbohydrates information provided  -  detailed on discharge instructions.  5) Chronic Care/Health Maintenance: -he is on a Statin medications and is encouraged to initiate and continue to follow up with Ophthalmology, Dentist,  Podiatrist (advised him to get established with one soon to evaluate his discoloration to his left foot) at least yearly or according to recommendations, and advised to stay away from smoking. I have recommended yearly flu vaccine and pneumonia vaccine at least every 5 years; moderate intensity exercise for up to 150 minutes weekly; and  sleep for at least 7 hours a day.  - he is advised to maintain close follow up with Fayrene Helper, MD for primary care needs, as well as his other providers for optimal and coordinated care.       I spent 30 minutes in the care of the patient today including review of labs from [place], Lipids, Thyroid Function, Hematology (current and previous including abstractions from other facilities); face-to-face time discussing  his blood glucose readings/logs, discussing hypoglycemia and hyperglycemia episodes and symptoms, medications doses, his options of short and long term treatment based on the latest standards of care / guidelines;  discussion about incorporating lifestyle medicine;  and documenting the encounter.    Please refer to Patient Instructions for Blood Glucose Monitoring and Insulin/Medications Dosing Guide"  in media tab for additional information. Please  also refer to " Patient Self Inventory" in the Media  tab for reviewed elements of pertinent patient history.  [name] participated in the discussions, expressed understanding, and voiced agreement with the above plans.  All questions were answered to his satisfaction. he is encouraged to contact clinic  should he have any questions or concerns prior to his return visit.   Follow up plan: - Return in about 4 months (around 02/19/2022) for Diabetes F/U with A1c in office, Bring meter and logs, No previsit labs.  Rayetta Pigg, [facility] [address] [place],  [address] Phone: [phone] Fax: [phone]  10/21/2021, 10:24 AM

## 2021-10-26 ENCOUNTER — Ambulatory Visit: Payer: Medicare HMO

## 2021-10-26 ENCOUNTER — Ambulatory Visit (INDEPENDENT_AMBULATORY_CARE_PROVIDER_SITE_OTHER): Payer: Medicare HMO | Admitting: Orthopedic Surgery

## 2021-10-26 ENCOUNTER — Telehealth: Payer: Self-pay | Admitting: Radiology

## 2021-10-26 ENCOUNTER — Other Ambulatory Visit: Payer: Self-pay

## 2021-10-26 ENCOUNTER — Encounter: Payer: Self-pay | Admitting: Orthopedic Surgery

## 2021-10-26 VITALS — BP 168/98 | HR 93 | Ht 68.0 in | Wt 210.0 lb

## 2021-10-26 DIAGNOSIS — M170 Bilateral primary osteoarthritis of knee: Secondary | ICD-10-CM

## 2021-10-26 DIAGNOSIS — M25562 Pain in left knee: Secondary | ICD-10-CM

## 2021-10-26 DIAGNOSIS — M25561 Pain in right knee: Secondary | ICD-10-CM

## 2021-10-26 DIAGNOSIS — G8929 Other chronic pain: Secondary | ICD-10-CM

## 2021-10-26 MED ORDER — DICLOFENAC POTASSIUM 50 MG PO TABS: 50.0000 mg | ORAL_TABLET | Freq: Two times a day (BID) | ORAL | 3 refills | Status: DC

## 2021-10-26 MED ORDER — DICLOFENAC SODIUM 50 MG PO TBEC: 50.0000 mg | DELAYED_RELEASE_TABLET | Freq: Two times a day (BID) | ORAL | 2 refills | Status: DC

## 2021-10-26 NOTE — Telephone Encounter (Signed)
°  Health Plans Preferred Products MELOXICAM IBUPROFEN  NAPROXEN   Diclofenac not preferred drug will you change to one of the above which are preferred

## 2021-10-26 NOTE — Progress Notes (Signed)
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  He has bilateral knee pain primarily on the medial joint no real stiffness or swelling of the joint itself  He does use a cane    Past Medical History:  Diagnosis Date   Acute left-sided low back pain with left-sided sciatica 11/04/2020   Anxiety    Arthritis    Depression 2001   Diabetes mellitus 2010   GERD (gastroesophageal reflux disease)    Hyperlipidemia    Hypertension 2005    BP (!) 168/98    Pulse 93    Ht 5\' 8"  (1.727 m)    Wt 210 lb (95.3 kg)    BMI 31.93 kg/m    General appearance: Well-developed well-nourished no gross deformities  Cardiovascular normal pulse and perfusion normal color without edema  Neurologically no sensation loss or deficits or pathologic reflexes  Psychological: Awake alert and oriented x3 mood and affect normal  Skin no lacerations or ulcerations no nodularity no palpable masses, no erythema or nodularity  Musculoskeletal: Right knee is in varus left knee is in valgus normal valgus he is tender on the medial joint line on the right and then lateral joint line on the left he is maintained approximately 125 degrees of knee flexion with full extension and no ligamentous instability in either joint  Imaging x-rays of both knees do show osteoarthritis medial compartment right knee with secondary bone changes of osteophytes and sclerosis with bone-on-bone disease  Left knee shows a valgus knee with narrowing of the lateral compartment and peripheral osteophytes as  well    A/P  He has bilateral knee pain he is a diabetic he is also noted to have hypertension there is some notes of about a bout of chest pain so I think it is prudent at this point to try some nonoperative measures  We will inject both knees  We will put him on an anti-inflammatory  He was allergic to ibuprofen but he tolerated Voltaren gel so I am going to stick with that orally    Procedure note for bilateral knee injections  Procedure note left knee injection verbal consent was obtained to inject left knee joint  Timeout was completed to confirm the site of injection  The medications used were 40 mg depomedrol and 3 cc of 1% lidocaine  Anesthesia was provided by ethyl chloride and the skin was prepped with alcohol.  After cleaning the skin with alcohol a 20-gauge needle was used to inject the left knee joint. There were no complications. A sterile bandage was applied.   Procedure note right knee injection verbal consent was obtained to inject right knee joint  Timeout was completed to confirm the site of injection  The medications used were 40 mg depomedrol and 3 cc of 1% lidocaine  Anesthesia was provided by ethyl chloride and the skin was prepped with alcohol.  After cleaning the skin with alcohol a 20-gauge needle was used to inject the right knee joint. There were no complications. A sterile bandage was  applied.   Meds ordered this encounter  Medications   DISCONTD: diclofenac (CATAFLAM) 50 MG tablet    Sig: Take 1 tablet (50 mg total) by mouth 2 (two) times daily.    Dispense:  60 tablet    Refill:  3   diclofenac (VOLTAREN) 50 MG EC tablet    Sig: Take 1 tablet (50 mg total) by mouth 2 (two) times daily.    Dispense:  60 tablet    Refill:  2   Meds ordered this encounter  Medications   DISCONTD: diclofenac (CATAFLAM) 50 MG tablet    Sig: Take 1 tablet (50 mg total) by mouth 2 (two) times daily.    Dispense:  60 tablet    Refill:  3   diclofenac  (VOLTAREN) 50 MG EC tablet    Sig: Take 1 tablet (50 mg total) by mouth 2 (two) times daily.    Dispense:  60 tablet    Refill:  2    Fu 4 months

## 2021-10-29 ENCOUNTER — Other Ambulatory Visit: Payer: Self-pay

## 2021-10-29 ENCOUNTER — Encounter: Payer: Self-pay | Admitting: Family Medicine

## 2021-10-29 ENCOUNTER — Ambulatory Visit (INDEPENDENT_AMBULATORY_CARE_PROVIDER_SITE_OTHER): Payer: Medicare HMO | Admitting: Family Medicine

## 2021-10-29 VITALS — BP 168/89 | HR 87 | Resp 16 | Ht 68.0 in | Wt 215.0 lb

## 2021-10-29 DIAGNOSIS — I10 Essential (primary) hypertension: Secondary | ICD-10-CM

## 2021-10-29 DIAGNOSIS — E782 Mixed hyperlipidemia: Secondary | ICD-10-CM

## 2021-10-29 DIAGNOSIS — Z0001 Encounter for general adult medical examination with abnormal findings: Secondary | ICD-10-CM

## 2021-10-29 MED ORDER — CLONIDINE HCL 0.1 MG PO TABS: 0.1000 mg | ORAL_TABLET | Freq: Every day | ORAL | 3 refills | Status: DC

## 2021-10-29 MED ORDER — HYDROCHLOROTHIAZIDE 25 MG PO TABS: 25.0000 mg | ORAL_TABLET | Freq: Every day | ORAL | 5 refills | Status: DC

## 2021-10-29 NOTE — Progress Notes (Signed)
° °  Norman Peters     MRN: [id number]      DOB: [birth year]   HPI: Patient is in for annual physical exam. Uncontrolled hypertension is also addressed  at the visit. Recent labs, if available are reviewed. Immunization is reviewed , and  updated if needed.    PE; BP (!) 168/89    Pulse 87    Resp 16    Ht 5\' 8"  (1.727 m)    Wt 215 lb (97.5 kg)    SpO2 93%    BMI 32.69 kg/m   Pleasant male, alert and oriented x 3, in no cardio-pulmonary distress. Afebrile. HEENT No facial trauma or asymetry. Sinuses non tender. EOMI External ears normal,  Neck: supple, no adenopathy,JVD or thyromegaly.No bruits.  Chest: Clear to ascultation bilaterally.No crackles or wheezes. Non tender to palpation  Cardiovascular system; Heart sounds normal,  S1 and  S2 ,no S3.  No murmur, or thrill. Apical beat not displaced Peripheral pulses normal.  Abdomen: Soft, non tender, no organomegaly or masses. No bruits. Bowel sounds normal. No guarding, tenderness or rebound.    Musculoskeletal exam:  Decreased ROM of spine, hips , shoulders and knees. No deformity ,swelling or crepitus noted. No muscle wasting or atrophy.   Neurologic: Cranial nerves 2 to 12 intact. Power, tone ,sensation and reflexes normal throughout. No disturbance in gait. No tremor.  Skin: Intact, no ulceration, erythema , scaling or rash noted. Pigmentation normal throughout  Psych; Normal mood and affect. Judgement and concentration normal   Assessment & Plan:  Encounter for Medicare annual examination with abnormal findings Annual exam as documented. Counseling done  re healthy lifestyle involving commitment to 150 minutes exercise per week, heart healthy diet, and attaining healthy weight.The importance of adequate sleep also discussed. Regular seat belt use and home safety, is also discussed. Changes in health habits are decided on by the patient with goals and time frames  set for achieving them. Immunization and  cancer screening needs are specifically addressed at this visit.   Essential hypertension, benign Uncontrolled, medication adjustments made DASH diet and commitment to daily physical activity for a minimum of 30 minutes discussed and encouraged, as a part of hypertension management. The importance of attaining a healthy weight is also discussed.  BP/Weight 10/29/2021 10/26/2021 10/21/2021 10/15/2021 10/14/2021 10/13/2021 83/02/1961  Systolic BP 229 798 921 194 174 081 -  Diastolic BP 89 98 77 88 80 88 -  Wt. (Lbs) 215 210 215.6 215.83 216.04 - 216  BMI 32.69 31.93 32.78 31.97 32.85 - 32.84

## 2021-10-29 NOTE — Patient Instructions (Addendum)
F/U in end Feb, call if you need me sooner  Fasting lipid, cmp and EGFr 3 days before next appointment  BP is high, increase HCTZ to 25 mg one daily, and add clonidine one at bedtime  It is important that you exercise regularly at least 30 minutes 5 times a week. If you develop chest pain, have severe difficulty breathing, or feel very tired, stop exercising immediately and seek medical attention  Thanks for choosing [facility], we consider it a privelige to serve you.

## 2021-11-02 ENCOUNTER — Encounter (HOSPITAL_COMMUNITY): Payer: Self-pay | Admitting: Hematology

## 2021-11-03 ENCOUNTER — Encounter: Payer: Self-pay | Admitting: Family Medicine

## 2021-11-03 DIAGNOSIS — Z0001 Encounter for general adult medical examination with abnormal findings: Secondary | ICD-10-CM | POA: Insufficient documentation

## 2021-11-03 NOTE — Assessment & Plan Note (Signed)

## 2021-11-03 NOTE — Assessment & Plan Note (Signed)
Uncontrolled, medication adjustments made DASH diet and commitment to daily physical activity for a minimum of 30 minutes discussed and encouraged, as a part of hypertension management. The importance of attaining a healthy weight is also discussed.  BP/Weight 10/29/2021 10/26/2021 10/21/2021 10/15/2021 10/14/2021 10/13/2021 67/0/1100  Systolic BP 349 611 643 539 122 583 -  Diastolic BP 89 98 77 88 80 88 -  Wt. (Lbs) 215 210 215.6 215.83 216.04 - 216  BMI 32.69 31.93 32.78 31.97 32.85 - 32.84

## 2021-11-16 ENCOUNTER — Inpatient Hospital Stay (HOSPITAL_COMMUNITY): Payer: Medicare HMO | Attending: Hematology

## 2021-11-16 ENCOUNTER — Other Ambulatory Visit: Payer: Self-pay

## 2021-11-16 VITALS — BP 157/90 | HR 90 | Temp 97.4°F | Resp 18 | Ht 68.0 in | Wt 214.4 lb

## 2021-11-16 DIAGNOSIS — E538 Deficiency of other specified B group vitamins: Secondary | ICD-10-CM | POA: Diagnosis not present

## 2021-11-16 DIAGNOSIS — D539 Nutritional anemia, unspecified: Secondary | ICD-10-CM

## 2021-11-16 DIAGNOSIS — D649 Anemia, unspecified: Secondary | ICD-10-CM | POA: Insufficient documentation

## 2021-11-16 MED ORDER — CYANOCOBALAMIN 1000 MCG/ML IJ SOLN
1000.0000 ug | Freq: Once | INTRAMUSCULAR | Status: AC
  Administered 2021-11-16: 1000 ug via INTRAMUSCULAR
  Filled 2021-11-16: qty 1

## 2021-11-16 NOTE — Patient Instructions (Signed)
[facility]  Discharge Instructions: Thank you for choosing [facility] to provide your oncology and hematology care.  If you have a lab appointment with the Cancer Center, please come in thru the Main Entrance and check in at the main information desk.  Wear comfortable clothing and clothing appropriate for easy access to any Portacath or PICC line.   We strive to give you quality time with your provider. You may need to reschedule your appointment if you arrive late (15 or more minutes).  Arriving late affects you and other patients whose appointments are after yours.  Also, if you miss three or more appointments without notifying the office, you may be dismissed from the clinic at the provider's discretion.      For prescription refill requests, have your pharmacy contact our office and allow 72 hours for refills to be completed.        To help prevent nausea and vomiting after your treatment, we encourage you to take your nausea medication as directed.  BELOW ARE SYMPTOMS THAT SHOULD BE REPORTED IMMEDIATELY: *FEVER GREATER THAN 100.4 F (38 C) OR HIGHER *CHILLS OR SWEATING *NAUSEA AND VOMITING THAT IS NOT CONTROLLED WITH YOUR NAUSEA MEDICATION *UNUSUAL SHORTNESS OF BREATH *UNUSUAL BRUISING OR BLEEDING *URINARY PROBLEMS (pain or burning when urinating, or frequent urination) *BOWEL PROBLEMS (unusual diarrhea, constipation, pain near the anus) TENDERNESS IN MOUTH AND THROAT WITH OR WITHOUT PRESENCE OF ULCERS (sore throat, sores in mouth, or a toothache) UNUSUAL RASH, SWELLING OR PAIN  UNUSUAL VAGINAL DISCHARGE OR ITCHING   Items with * indicate a potential emergency and should be followed up as soon as possible or go to the Emergency Department if any problems should occur.  Please show the CHEMOTHERAPY ALERT CARD or IMMUNOTHERAPY ALERT CARD at check-in to the Emergency Department and triage nurse.  Should you have questions after your visit or need to cancel  or reschedule your appointment, please contact [facility] [phone]  and follow the prompts.  Office hours are 8:00 a.m. to 4:30 p.m. Monday - Friday. Please note that voicemails left after 4:00 p.m. may not be returned until the following business day.  We are closed weekends and major holidays. You have access to a nurse at all times for urgent questions. Please call the main number to the clinic [phone] and follow the prompts.  For any non-urgent questions, you may also contact your provider using MyChart. We now offer e-Visits for anyone 18 and older to request care online for non-urgent symptoms. For details visit [email or web address].   Also download the MyChart app! Go to the app store, search "MyChart", open the app, select [facility], and log in with your MyChart username and password.  Due to Covid, a mask is required upon entering the hospital/clinic. If you do not have a mask, one will be given to you upon arrival. For doctor visits, patients may have 1 support person aged 18 or older with them. For treatment visits, patients cannot have anyone with them due to current Covid guidelines and our immunocompromised population.  

## 2021-11-16 NOTE — Progress Notes (Signed)
Patient presents today for Vitamin B12 injection.  Patient is in satisfactory condition with no complaints voiced.  Vital signs are stable.  We will proceed with treatment per MD orders.   Patient tolerated injection with no complaints voiced.  Site clean and dry with no bruising or swelling noted.  No complaints of pain.  Discharged with vital signs stable and no signs or symptoms of distress noted.

## 2021-11-30 ENCOUNTER — Ambulatory Visit (INDEPENDENT_AMBULATORY_CARE_PROVIDER_SITE_OTHER): Payer: Medicare HMO | Admitting: Gastroenterology

## 2021-12-17 ENCOUNTER — Other Ambulatory Visit: Payer: Self-pay

## 2021-12-17 ENCOUNTER — Inpatient Hospital Stay (HOSPITAL_COMMUNITY): Payer: Medicare HMO | Attending: Hematology

## 2021-12-17 VITALS — BP 163/88 | HR 96 | Temp 99.0°F | Resp 18

## 2021-12-17 DIAGNOSIS — E538 Deficiency of other specified B group vitamins: Secondary | ICD-10-CM | POA: Diagnosis not present

## 2021-12-17 DIAGNOSIS — D649 Anemia, unspecified: Secondary | ICD-10-CM | POA: Diagnosis not present

## 2021-12-17 DIAGNOSIS — D539 Nutritional anemia, unspecified: Secondary | ICD-10-CM

## 2021-12-17 MED ORDER — CYANOCOBALAMIN 1000 MCG/ML IJ SOLN
1000.0000 ug | Freq: Once | INTRAMUSCULAR | Status: AC
  Administered 2021-12-17: 1000 ug via INTRAMUSCULAR
  Filled 2021-12-17: qty 1

## 2021-12-17 NOTE — Patient Instructions (Signed)
[facility]  Discharge Instructions: Thank you for choosing [facility] to provide your oncology and hematology care.  If you have a lab appointment with the Cancer Center, please come in thru the Main Entrance and check in at the main information desk.  Wear comfortable clothing and clothing appropriate for easy access to any Portacath or PICC line.   We strive to give you quality time with your provider. You may need to reschedule your appointment if you arrive late (15 or more minutes).  Arriving late affects you and other patients whose appointments are after yours.  Also, if you miss three or more appointments without notifying the office, you may be dismissed from the clinic at the provider's discretion.      For prescription refill requests, have your pharmacy contact our office and allow 72 hours for refills to be completed.        To help prevent nausea and vomiting after your treatment, we encourage you to take your nausea medication as directed.  BELOW ARE SYMPTOMS THAT SHOULD BE REPORTED IMMEDIATELY: *FEVER GREATER THAN 100.4 F (38 C) OR HIGHER *CHILLS OR SWEATING *NAUSEA AND VOMITING THAT IS NOT CONTROLLED WITH YOUR NAUSEA MEDICATION *UNUSUAL SHORTNESS OF BREATH *UNUSUAL BRUISING OR BLEEDING *URINARY PROBLEMS (pain or burning when urinating, or frequent urination) *BOWEL PROBLEMS (unusual diarrhea, constipation, pain near the anus) TENDERNESS IN MOUTH AND THROAT WITH OR WITHOUT PRESENCE OF ULCERS (sore throat, sores in mouth, or a toothache) UNUSUAL RASH, SWELLING OR PAIN  UNUSUAL VAGINAL DISCHARGE OR ITCHING   Items with * indicate a potential emergency and should be followed up as soon as possible or go to the Emergency Department if any problems should occur.  Please show the CHEMOTHERAPY ALERT CARD or IMMUNOTHERAPY ALERT CARD at check-in to the Emergency Department and triage nurse.  Should you have questions after your visit or need to cancel  or reschedule your appointment, please contact [facility] [phone]  and follow the prompts.  Office hours are 8:00 a.m. to 4:30 p.m. Monday - Friday. Please note that voicemails left after 4:00 p.m. may not be returned until the following business day.  We are closed weekends and major holidays. You have access to a nurse at all times for urgent questions. Please call the main number to the clinic [phone] and follow the prompts.  For any non-urgent questions, you may also contact your provider using MyChart. We now offer e-Visits for anyone 18 and older to request care online for non-urgent symptoms. For details visit [email or web address].   Also download the MyChart app! Go to the app store, search "MyChart", open the app, select [facility], and log in with your MyChart username and password.  Due to Covid, a mask is required upon entering the hospital/clinic. If you do not have a mask, one will be given to you upon arrival. For doctor visits, patients may have 1 support person aged 18 or older with them. For treatment visits, patients cannot have anyone with them due to current Covid guidelines and our immunocompromised population.  

## 2021-12-17 NOTE — Progress Notes (Signed)
Patient presents today for Vitamin B12 infusion.  Patient is in satisfactory condition with no complaints voiced.  Vital signs are stable.  We will proceed with injection per MD orders.   Patient tolerated injection with no complaints voiced.  Site clean and dry with no bruising or swelling noted.  No complaints of pain.  Discharged with vital signs stable and no signs or symptoms of distress noted.   

## 2021-12-28 DIAGNOSIS — E782 Mixed hyperlipidemia: Secondary | ICD-10-CM | POA: Diagnosis not present

## 2021-12-28 DIAGNOSIS — I10 Essential (primary) hypertension: Secondary | ICD-10-CM | POA: Diagnosis not present

## 2021-12-29 LAB — CMP14+EGFR
ALT: 20 IU/L (ref 0–44)
AST: 11 IU/L (ref 0–40)
Albumin/Globulin Ratio: 1.5 (ref 1.2–2.2)
Albumin: 4.4 g/dL (ref 3.8–4.8)
Alkaline Phosphatase: 92 IU/L (ref 44–121)
BUN/Creatinine Ratio: 10 (ref 10–24)
BUN: 16 mg/dL (ref 8–27)
Bilirubin Total: 0.4 mg/dL (ref 0.0–1.2)
CO2: 26 mmol/L (ref 20–29)
Calcium: 10.2 mg/dL (ref 8.6–10.2)
Chloride: 95 mmol/L — ABNORMAL LOW (ref 96–106)
Creatinine, Ser: 1.58 mg/dL — ABNORMAL HIGH (ref 0.76–1.27)
Globulin, Total: 2.9 g/dL (ref 1.5–4.5)
Glucose: 315 mg/dL — ABNORMAL HIGH (ref 70–99)
Potassium: 4.7 mmol/L (ref 3.5–5.2)
Sodium: 135 mmol/L (ref 134–144)
Total Protein: 7.3 g/dL (ref 6.0–8.5)
eGFR: 48 mL/min/{1.73_m2} — ABNORMAL LOW (ref >59)

## 2021-12-29 LAB — LIPID PANEL
Chol/HDL Ratio: 3.2 ratio (ref 0.0–5.0)
Cholesterol, Total: 95 mg/dL — ABNORMAL LOW (ref 100–199)
HDL: 30 mg/dL — ABNORMAL LOW (ref >39)
LDL Chol Calc (NIH): 29 mg/dL (ref 0–99)
Triglycerides: 233 mg/dL — ABNORMAL HIGH (ref 0–149)
VLDL Cholesterol Cal: 36 mg/dL (ref 5–40)

## 2021-12-30 ENCOUNTER — Ambulatory Visit: Payer: Medicare HMO | Admitting: Family Medicine

## 2022-01-06 ENCOUNTER — Other Ambulatory Visit: Payer: Self-pay | Admitting: Family Medicine

## 2022-01-13 ENCOUNTER — Inpatient Hospital Stay (HOSPITAL_COMMUNITY): Payer: Medicare HMO | Attending: Hematology

## 2022-01-13 ENCOUNTER — Encounter (HOSPITAL_COMMUNITY): Payer: Self-pay

## 2022-01-13 ENCOUNTER — Other Ambulatory Visit: Payer: Self-pay

## 2022-01-13 VITALS — BP 131/81 | HR 95 | Temp 98.2°F | Resp 18

## 2022-01-13 DIAGNOSIS — E538 Deficiency of other specified B group vitamins: Secondary | ICD-10-CM | POA: Diagnosis not present

## 2022-01-13 DIAGNOSIS — D539 Nutritional anemia, unspecified: Secondary | ICD-10-CM

## 2022-01-13 MED ORDER — CYANOCOBALAMIN 1000 MCG/ML IJ SOLN
1000.0000 ug | Freq: Once | INTRAMUSCULAR | Status: AC
  Administered 2022-01-13: 1000 ug via INTRAMUSCULAR
  Filled 2022-01-13: qty 1

## 2022-01-13 NOTE — Progress Notes (Signed)
Patient tolerated injection with no complaints voiced. Site clean and dry with no bruising or swelling noted at site. See MAR for details. Band aid applied.  Patient stable during and after injection. VSS with discharge and left in satisfactory condition with no s/s of distress noted.  

## 2022-01-13 NOTE — Patient Instructions (Signed)
[facility]  Discharge Instructions: ?Thank you for choosing [facility] to provide your oncology and hematology care.  ?If you have a lab appointment with the [facility], please come in thru the Main Entrance and check in at the main information desk. ? ?Wear comfortable clothing and clothing appropriate for easy access to any Portacath or PICC line.  ? ?We strive to give you quality time with your provider. You may need to reschedule your appointment if you arrive late (15 or more minutes).  Arriving late affects you and other patients whose appointments are after yours.  Also, if you miss three or more appointments without notifying the office, you may be dismissed from the clinic at the provider?s discretion.    ?  ?For prescription refill requests, have your pharmacy contact our office and allow 72 hours for refills to be completed.   ? ?Today you received the following B12 injection, return as scheduled. ?  ?To help prevent nausea and vomiting after your treatment, we encourage you to take your nausea medication as directed. ? ?BELOW ARE SYMPTOMS THAT SHOULD BE REPORTED IMMEDIATELY: ?*FEVER GREATER THAN 100.4 F (38 ?C) OR HIGHER ?*CHILLS OR SWEATING ?*NAUSEA AND VOMITING THAT IS NOT CONTROLLED WITH YOUR NAUSEA MEDICATION ?*UNUSUAL SHORTNESS OF BREATH ?*UNUSUAL BRUISING OR BLEEDING ?*URINARY PROBLEMS (pain or burning when urinating, or frequent urination) ?*BOWEL PROBLEMS (unusual diarrhea, constipation, pain near the anus) ?TENDERNESS IN MOUTH AND THROAT WITH OR WITHOUT PRESENCE OF ULCERS (sore throat, sores in mouth, or a toothache) ?UNUSUAL RASH, SWELLING OR PAIN  ?UNUSUAL VAGINAL DISCHARGE OR ITCHING  ? ?Items with * indicate a potential emergency and should be followed up as soon as possible or go to the Emergency Department if any problems should occur. ? ?Please show the CHEMOTHERAPY ALERT CARD or IMMUNOTHERAPY ALERT CARD at check-in to the Emergency Department and triage  nurse. ? ?Should you have questions after your visit or need to cancel or reschedule your appointment, please contact [facility] [phone]  and follow the prompts.  Office hours are 8:00 a.m. to 4:30 p.m. Monday - Friday. Please note that voicemails left after 4:00 p.m. may not be returned until the following business day.  We are closed weekends and major holidays. You have access to a nurse at all times for urgent questions. Please call the main number to the clinic [phone] and follow the prompts. ? ?For any non-urgent questions, you may also contact your provider using MyChart. We now offer e-Visits for anyone 66 and older to request care online for non-urgent symptoms. For details visit [email or web address]. ?  ?Also download the MyChart app! Go to the app store, search "MyChart", open the app, select [facility], and log in with your MyChart username and password. ? ?Due to Covid, a mask is required upon entering the hospital/clinic. If you do not have a mask, one will be given to you upon arrival. For doctor visits, patients may have 1 support person aged 66 or older with them. For treatment visits, patients cannot have anyone with them due to current Covid guidelines and our immunocompromised population.  ?

## 2022-01-14 ENCOUNTER — Ambulatory Visit (HOSPITAL_COMMUNITY): Payer: Medicare HMO

## 2022-01-15 ENCOUNTER — Encounter: Payer: Self-pay | Admitting: Family Medicine

## 2022-01-15 ENCOUNTER — Other Ambulatory Visit: Payer: Self-pay

## 2022-01-15 ENCOUNTER — Ambulatory Visit (INDEPENDENT_AMBULATORY_CARE_PROVIDER_SITE_OTHER): Payer: Medicare HMO | Admitting: Family Medicine

## 2022-01-15 VITALS — BP 161/94 | HR 98 | Resp 17 | Ht 68.0 in | Wt 207.0 lb

## 2022-01-15 DIAGNOSIS — E782 Mixed hyperlipidemia: Secondary | ICD-10-CM | POA: Diagnosis not present

## 2022-01-15 DIAGNOSIS — R062 Wheezing: Secondary | ICD-10-CM | POA: Diagnosis not present

## 2022-01-15 DIAGNOSIS — E669 Obesity, unspecified: Secondary | ICD-10-CM

## 2022-01-15 DIAGNOSIS — E1165 Type 2 diabetes mellitus with hyperglycemia: Secondary | ICD-10-CM

## 2022-01-15 DIAGNOSIS — I10 Essential (primary) hypertension: Secondary | ICD-10-CM | POA: Diagnosis not present

## 2022-01-15 DIAGNOSIS — J302 Other seasonal allergic rhinitis: Secondary | ICD-10-CM | POA: Diagnosis not present

## 2022-01-15 DIAGNOSIS — N1831 Chronic kidney disease, stage 3a: Secondary | ICD-10-CM

## 2022-01-15 DIAGNOSIS — E1122 Type 2 diabetes mellitus with diabetic chronic kidney disease: Secondary | ICD-10-CM | POA: Diagnosis not present

## 2022-01-15 DIAGNOSIS — F419 Anxiety disorder, unspecified: Secondary | ICD-10-CM | POA: Diagnosis not present

## 2022-01-15 DIAGNOSIS — E559 Vitamin D deficiency, unspecified: Secondary | ICD-10-CM | POA: Diagnosis not present

## 2022-01-15 DIAGNOSIS — Z794 Long term (current) use of insulin: Secondary | ICD-10-CM

## 2022-01-15 DIAGNOSIS — F32A Depression, unspecified: Secondary | ICD-10-CM

## 2022-01-15 DIAGNOSIS — M1711 Unilateral primary osteoarthritis, right knee: Secondary | ICD-10-CM

## 2022-01-15 MED ORDER — FENOFIBRATE 48 MG PO TABS: 48.0000 mg | ORAL_TABLET | Freq: Every day | ORAL | 5 refills | Status: DC

## 2022-01-15 MED ORDER — OLMESARTAN MEDOXOMIL 20 MG PO TABS: 20.0000 mg | ORAL_TABLET | Freq: Every day | ORAL | 3 refills | Status: DC

## 2022-01-15 MED ORDER — ALBUTEROL SULFATE HFA 108 (90 BASE) MCG/ACT IN AERS: 2.0000 | INHALATION_SPRAY | Freq: Every day | RESPIRATORY_TRACT | 1 refills | Status: DC | PRN

## 2022-01-15 NOTE — Patient Instructions (Addendum)
F/U in end May,or early June, call if you need me sooner, please bring meds to visit ? ?New additional; ,medication for blood pressure is olmesartan 20 mg daily, continue amlodipine and hCTZ as before ? ?New additional med for TG is fenofibrate ? ?Fasting lipid, cmp and eGFR, TSH and vit D 5 days before visit ? ?It is important that you exercise regularly at least 30 minutes 5 times a week. If you develop chest pain, have severe difficulty breathing, or feel very tired, stop exercising immediately and seek medical attention  ? ?Thanks for choosing [facility], we consider it a privelige to serve you. ? ?

## 2022-01-17 ENCOUNTER — Encounter: Payer: Self-pay | Admitting: Family Medicine

## 2022-01-17 NOTE — Progress Notes (Signed)
? ?Norman Peters     MRN: [id number]      DOB: [birth year] ? ? ?HPI ?Norman Peters is here for follow up and re-evaluation of chronic medical conditions, medication management and review of any available recent lab and radiology data.  ?Preventive health is updated, specifically  Cancer screening and Immunization.   ?Questions or concerns regarding consultations or procedures which the PT has had in the interim are  addressed. ?The PT denies any adverse reactions to current medications since the last visit.  ?There are no new concerns.  ?There are no specific complaints  ?Denies polyuria, polydipsia, blurred vision , or hypoglycemic episodes. ?Reports fluctauating blood sugar values ? ?ROS ?Denies recent fever or chills. ?Denies sinus pressure, nasal congestion, ear pain or sore throat. ?Denies chest congestion, productive cough or wheezing. ?Denies chest pains, palpitations and leg swelling ?Denies abdominal pain, nausea, vomiting,diarrhea or constipation.   ?Denies dysuria, frequency, hesitancy or incontinence. ?C/o  joint pain, swelling and limitation in mobility. ?Denies headaches, seizures, numbness, or tingling. ?Denies depression, anxiety or insomnia. ?Denies skin break down or rash. ? ? ?PE ? ?BP (!) 161/94   Pulse 98   Resp 17   Ht '5\' 8"'$  (1.727 m)   Wt 207 lb (93.9 kg)   SpO2 93%   BMI 31.47 kg/m?  ? ?Patient alert and oriented and in no cardiopulmonary distress. ? ?HEENT: No facial asymmetry, EOMI,     Neck supple . ? ?Chest: Clear to auscultation bilaterally. ? ?CVS: S1, S2 no murmurs, no S3.Regular rate. ? ?ABD: Soft non tender.  ? ?Ext: No edema ? ?MS: decreased  ROM spine, shoulders, hips and knees. ? ?Skin: Intact, no ulcerations or rash noted. ? ?Psych: Good eye contact, normal affect. Memory intact not anxious or depressed appearing. ? ?CNS: CN 2-12 intact, power,  normal throughout.no focal deficits noted. ? ? ?Assessment & Plan ? ?Type 2 diabetes mellitus with stage 3a chronic kidney disease, with  long-term current use of insulin ([place]) ?Uncontrolled but improving , managed by ndo ?Norman Peters is reminded of the importance of commitment to daily physical activity for 30 minutes or more, as able and the need to limit carbohydrate intake to 30 to 60 grams per meal to help with blood sugar control.  ? ?The need to take medication as prescribed, test blood sugar as directed, and to call between visits if there is a concern that blood sugar is uncontrolled is also discussed.  ? ?[name] is reminded of the importance of daily foot exam, annual eye examination, and good blood sugar, blood pressure and cholesterol control. ? ?Diabetic Labs Latest Ref Rng & Units 12/28/2021 10/21/2021 10/12/2021 08/19/2021 08/07/2021  ?HbA1c 0.0 - 7.0 % - 7.8(A) - - -  ?Microalbumin mg/L - - - - -  ?Micro/Creat Ratio 0 - 29 mg/g creat - - - 620(H) -  ?Chol 100 - 199 mg/dL 95(L) - - - -  ?HDL >39 mg/dL 30(L) - - - -  ?Calc LDL 0 - 99 mg/dL 29 - - - -  ?Triglycerides 0 - 149 mg/dL 233(H) - - - -  ?Creatinine 0.76 - 1.27 mg/dL 1.58(H) - 1.57(H) - 1.36(H)  ? ?BP/Weight 01/15/2022 01/13/2022 12/17/2021 11/16/2021 10/29/2021 10/26/2021 10/21/2021  ?Systolic BP 935 701 779 390 168 168 148  ?Diastolic BP 94 81 88 90 89 98 77  ?Wt. (Lbs) 207 - - 214.4 215 210 215.6  ?BMI 31.47 - - 32.6 32.69 31.93 32.78  ? ?Foot/eye exam completion dates  Latest Ref Rng & Units 01/15/2022 07/01/2021  ?Eye Exam No Retinopathy - Retinopathy(A)  ?Foot Form Completion - Done -  ? ? ? ? ? ? ?Mixed hyperlipidemia ?Elevated TG add fenofibrate ?Hyperlipidemia:Low fat diet discussed and encouraged. ? ? ?Lipid Panel  ?Lab Results  ?Component Value Date  ? CHOL 95 (L) 12/28/2021  ? HDL 30 (L) 12/28/2021  ? [place] 29 12/28/2021  ? TRIG 233 (H) 12/28/2021  ? CHOLHDL 3.2 12/28/2021  ? ? ? ?Updated lab needed at/ before next visit. ? ? ?Essential hypertension, benign ?unccontrolled ,add olmesartan ?DASH diet and commitment to daily physical activity for a minimum of 30 minutes discussed  and encouraged, as a part of hypertension management. ?The importance of attaining a healthy weight is also discussed. ? ?BP/Weight 01/15/2022 01/13/2022 12/17/2021 11/16/2021 10/29/2021 10/26/2021 10/21/2021  ?Systolic BP 536 144 315 400 168 168 148  ?Diastolic BP 94 81 88 90 89 98 77  ?Wt. (Lbs) 207 - - 214.4 215 210 215.6  ?BMI 31.47 - - 32.6 32.69 31.93 32.78  ? ? ? ? ? ?Anxiety and depression ?Controlled, no change in medication ? ? ?GERD (gastroesophageal reflux disease) ?Controlled, no change in medication ? ? ?Obesity (BMI 30.0-34.9) ? ?Patient re-educated about  the importance of commitment to a  minimum of 150 minutes of exercise per week as able. ? ?The importance of healthy food choices with portion control discussed, as well as eating regularly and within a 12 hour window most days. ?The need to choose "clean , [place]" food 50 to 75% of the time is discussed, as well as to make water the primary drink and set a goal of 64 ounces water daily. ? ?  ?Weight /BMI 01/15/2022 11/16/2021 10/29/2021  ?WEIGHT 207 lb 214 lb 6.4 oz 215 lb  ?HEIGHT '5\' 8"'$  '5\' 8"'$  '5\' 8"'$   ?BMI 31.47 kg/m2 32.6 kg/m2 32.69 kg/m2  ? ? ? ? ?Seasonal allergies ?Controlled, no change in medication ? ? ?Osteoarthritis of right knee ?Managed by ortho, responds to intra articular injectiuon ? ?

## 2022-01-17 NOTE — Assessment & Plan Note (Signed)
Uncontrolled but improving , managed by ndo ?Norman Peters is reminded of the importance of commitment to daily physical activity for 30 minutes or more, as able and the need to limit carbohydrate intake to 30 to 60 grams per meal to help with blood sugar control.  ? ?The need to take medication as prescribed, test blood sugar as directed, and to call between visits if there is a concern that blood sugar is uncontrolled is also discussed.  ? ?Norman Peters is reminded of the importance of daily foot exam, annual eye examination, and good blood sugar, blood pressure and cholesterol control. ? ?Diabetic Labs Latest Ref Rng & Units 12/28/2021 10/21/2021 10/12/2021 08/19/2021 08/07/2021  ?HbA1c 0.0 - 7.0 % - 7.8(A) - - -  ?Microalbumin mg/L - - - - -  ?Micro/Creat Ratio 0 - 29 mg/g creat - - - 620(H) -  ?Chol 100 - 199 mg/dL 95(L) - - - -  ?HDL >39 mg/dL 30(L) - - - -  ?Calc LDL 0 - 99 mg/dL 29 - - - -  ?Triglycerides 0 - 149 mg/dL 233(H) - - - -  ?Creatinine 0.76 - 1.27 mg/dL 1.58(H) - 1.57(H) - 1.36(H)  ? ?BP/Weight 01/15/2022 01/13/2022 12/17/2021 11/16/2021 10/29/2021 10/26/2021 10/21/2021  ?Systolic BP 237 628 315 176 168 168 148  ?Diastolic BP 94 81 88 90 89 98 77  ?Wt. (Lbs) 207 - - 214.4 215 210 215.6  ?BMI 31.47 - - 32.6 32.69 31.93 32.78  ? ?Foot/eye exam completion dates Latest Ref Rng & Units 01/15/2022 07/01/2021  ?Eye Exam No Retinopathy - Retinopathy(A)  ?Foot Form Completion - Done -  ? ? ? ? ? ?

## 2022-01-17 NOTE — Assessment & Plan Note (Signed)
Managed by ortho, responds to intra articular injectiuon ?

## 2022-01-17 NOTE — Assessment & Plan Note (Signed)
Controlled, no change in medication  

## 2022-01-17 NOTE — Assessment & Plan Note (Signed)
Elevated TG add fenofibrate ?Hyperlipidemia:Low fat diet discussed and encouraged. ? ? ?Lipid Panel  ?Lab Results  ?Component Value Date  ? CHOL 95 (L) 12/28/2021  ? HDL 30 (L) 12/28/2021  ? [place] 29 12/28/2021  ? TRIG 233 (H) 12/28/2021  ? CHOLHDL 3.2 12/28/2021  ? ? ? ?Updated lab needed at/ before next visit. ? ?

## 2022-01-17 NOTE — Assessment & Plan Note (Signed)
unccontrolled ,add olmesartan ?DASH diet and commitment to daily physical activity for a minimum of 30 minutes discussed and encouraged, as a part of hypertension management. ?The importance of attaining a healthy weight is also discussed. ? ?BP/Weight 01/15/2022 01/13/2022 12/17/2021 11/16/2021 10/29/2021 10/26/2021 10/21/2021  ?Systolic BP 834 621 947 125 168 168 148  ?Diastolic BP 94 81 88 90 89 98 77  ?Wt. (Lbs) 207 - - 214.4 215 210 215.6  ?BMI 31.47 - - 32.6 32.69 31.93 32.78  ? ? ? ? ?

## 2022-01-17 NOTE — Assessment & Plan Note (Signed)
?  Patient re-educated about  the importance of commitment to a  minimum of 150 minutes of exercise per week as able. ? ?The importance of healthy food choices with portion control discussed, as well as eating regularly and within a 12 hour window most days. ?The need to choose "clean , [place]" food 50 to 75% of the time is discussed, as well as to make water the primary drink and set a goal of 64 ounces water daily. ? ?  ?Weight /BMI 01/15/2022 11/16/2021 10/29/2021  ?WEIGHT 207 lb 214 lb 6.4 oz 215 lb  ?HEIGHT '5\' 8"'$  '5\' 8"'$  '5\' 8"'$   ?BMI 31.47 kg/m2 32.6 kg/m2 32.69 kg/m2  ? ? ? ?

## 2022-01-25 ENCOUNTER — Other Ambulatory Visit: Payer: Self-pay | Admitting: Family Medicine

## 2022-01-25 DIAGNOSIS — I10 Essential (primary) hypertension: Secondary | ICD-10-CM

## 2022-02-08 ENCOUNTER — Inpatient Hospital Stay (HOSPITAL_COMMUNITY): Payer: Medicare HMO | Attending: Hematology

## 2022-02-08 DIAGNOSIS — D539 Nutritional anemia, unspecified: Secondary | ICD-10-CM

## 2022-02-08 DIAGNOSIS — E538 Deficiency of other specified B group vitamins: Secondary | ICD-10-CM | POA: Insufficient documentation

## 2022-02-08 LAB — CBC WITH DIFFERENTIAL/PLATELET
Abs Immature Granulocytes: 0.04 10*3/uL (ref 0.00–0.07)
Basophils Absolute: 0.1 10*3/uL (ref 0.0–0.1)
Basophils Relative: 1 %
Eosinophils Absolute: 0.3 10*3/uL (ref 0.0–0.5)
Eosinophils Relative: 2 %
HCT: 46.2 % (ref 39.0–52.0)
Hemoglobin: 15.4 g/dL (ref 13.0–17.0)
Immature Granulocytes: 0 %
Lymphocytes Relative: 29 %
Lymphs Abs: 3.3 10*3/uL (ref 0.7–4.0)
MCH: 29.5 pg (ref 26.0–34.0)
MCHC: 33.3 g/dL (ref 30.0–36.0)
MCV: 88.5 fL (ref 80.0–100.0)
Monocytes Absolute: 0.9 10*3/uL (ref 0.1–1.0)
Monocytes Relative: 8 %
Neutro Abs: 6.9 10*3/uL (ref 1.7–7.7)
Neutrophils Relative %: 60 %
Platelets: 307 10*3/uL (ref 150–400)
RBC: 5.22 MIL/uL (ref 4.22–5.81)
RDW: 14.1 % (ref 11.5–15.5)
WBC: 11.5 10*3/uL — ABNORMAL HIGH (ref 4.0–10.5)
nRBC: 0 % (ref 0.0–0.2)

## 2022-02-08 LAB — VITAMIN B12: Vitamin B-12: 1735 pg/mL — ABNORMAL HIGH (ref 180–914)

## 2022-02-11 LAB — METHYLMALONIC ACID, SERUM: Methylmalonic Acid, Quantitative: 163 nmol/L (ref 0–378)

## 2022-02-15 ENCOUNTER — Inpatient Hospital Stay (HOSPITAL_COMMUNITY): Payer: Medicare HMO

## 2022-02-15 ENCOUNTER — Inpatient Hospital Stay (HOSPITAL_BASED_OUTPATIENT_CLINIC_OR_DEPARTMENT_OTHER): Payer: Medicare HMO | Admitting: Hematology

## 2022-02-15 VITALS — BP 145/88 | HR 90 | Temp 98.3°F | Resp 16 | Ht 68.0 in | Wt 210.9 lb

## 2022-02-15 DIAGNOSIS — D539 Nutritional anemia, unspecified: Secondary | ICD-10-CM

## 2022-02-15 DIAGNOSIS — E538 Deficiency of other specified B group vitamins: Secondary | ICD-10-CM

## 2022-02-15 MED ORDER — CYANOCOBALAMIN 1000 MCG/ML IJ SOLN
1000.0000 ug | Freq: Once | INTRAMUSCULAR | Status: AC
  Administered 2022-02-15: 1000 ug via INTRAMUSCULAR
  Filled 2022-02-15: qty 1

## 2022-02-15 NOTE — Progress Notes (Signed)
Patient tolerated injection with no complaints voiced.  Site clean and dry with no bruising or swelling noted at site.  See MAR for details.  Band aid applied.  Patient stable during and after injection.  Vss with discharge and left in satisfactory condition with no s/s of distress noted.  

## 2022-02-15 NOTE — Progress Notes (Signed)
? ?Norman Peters ?[address] ?[place], [place] [address] ? ? ?CLINIC:  ?Medical Oncology/Hematology ? ?PCP:  ?Fayrene Helper, MD ?[address] [place] / [place] [address]  ?[phone] ? ?REASON FOR VISIT:  ?Follow-up for anemia and vitamin B-12 deficiency ? ?PRIOR THERAPY: none ? ?CURRENT THERAPY: monthly vitamin B-12 injections ? ?INTERVAL HISTORY:  ?Norman Peters, a 66 y.o. male, returns for routine follow-up for his anemia and vitamin B-12 deficiency. Norman Peters was last seen on 10/15/2021. ? ?Today he reports feeling good. He received monthly vitamin B-12 injections and he takes vitamin B-12 tablets daily.  ? ?REVIEW OF SYSTEMS:  ?Review of Systems  ?Constitutional:  Negative for appetite change and fatigue.  ?Respiratory:  Positive for cough.   ?Gastrointestinal:  Positive for constipation.  ?Musculoskeletal:  Positive for arthralgias (6/10 knees).  ?Neurological:  Positive for numbness.  ?All other systems reviewed and are negative. ? ?PAST MEDICAL/SURGICAL HISTORY:  ?Past Medical History:  ?Diagnosis Date  ? Acute left-sided low back pain with left-sided sciatica 11/04/2020  ? Anxiety   ? Arthritis   ? Depression 2001  ? Diabetes mellitus 2010  ? GERD (gastroesophageal reflux disease)   ? Hyperlipidemia   ? Hypertension 2005  ? ?Past Surgical History:  ?Procedure Laterality Date  ? COLONOSCOPY N/A 07/07/2016  ? Procedure: COLONOSCOPY;  Surgeon: Rogene Houston, MD;  Location: [facility];  Service: Endoscopy;  Laterality: N/A;  1030  ? FINGER SURGERY Left 1974  ? fifth , trauma on job, had to replace/repair finger  ? HERNIA REPAIR  8768  ? x3, umbilical and bilateral hernia  ? ? ?SOCIAL HISTORY:  ?Social History  ? ?Socioeconomic History  ? Marital status: Married  ?  Spouse name: Not on file  ? Number of children: Not on file  ? Years of education: Not on file  ? Highest education level: Not on file  ?Occupational History  ? Not on file  ?Tobacco Use  ? Smoking status: Former  ?   Packs/day: 0.50  ?  Years: 30.00  ?  Pack years: 15.00  ?  Types: Cigarettes  ?  Quit date: 01/20/2011  ?  Years since quitting: 11.0  ? Smokeless tobacco: Never  ?Vaping Use  ? Vaping Use: Never used  ?Substance and Sexual Activity  ? Alcohol use: No  ? Drug use: No  ? Sexual activity: Not Currently  ?Other Topics Concern  ? Not on file  ?Social History Narrative  ? Not on file  ? ?Social Determinants of Health  ? ?Financial Resource Strain: Low Risk   ? Difficulty of Paying Living Expenses: Not hard at all  ?Food Insecurity: No Food Insecurity  ? Worried About Charity fundraiser in the Last Year: Never true  ? Ran Out of Food in the Last Year: Never true  ?Transportation Needs: No Transportation Needs  ? Lack of Transportation (Medical): No  ? Lack of Transportation (Non-Medical): No  ?Physical Activity: Insufficiently Active  ? Days of Exercise per Week: 5 days  ? Minutes of Exercise per Session: 20 min  ?Stress: No Stress Concern Present  ? Feeling of Stress : Not at all  ?Social Connections: Socially Integrated  ? Frequency of Communication with Friends and Family: More than three times a week  ? Frequency of Social Gatherings with Friends and Family: Once a week  ? Attends Religious Services: More than 4 times per year  ? Active Member of Clubs or  Organizations: Yes  ? Attends Archivist Meetings: More than 4 times per year  ? Marital Status: Married  ?Intimate Partner Violence: Not At Risk  ? Fear of Current or Ex-Partner: No  ? Emotionally Abused: No  ? Physically Abused: No  ? Sexually Abused: No  ? ? ?FAMILY HISTORY:  ?Family History  ?Problem Relation Age of Onset  ? Stroke Mother   ? ? ?CURRENT MEDICATIONS:  ?Current Outpatient Medications  ?Medication Sig Dispense Refill  ? acetaminophen (TYLENOL) 325 MG tablet Take 650 mg by mouth every 6 (six) hours as needed.    ? albuterol (VENTOLIN HFA) 108 (90 Base) MCG/ACT inhaler Inhale 2 puffs into the lungs daily as needed for wheezing or shortness  of breath. 1 each 1  ? amLODipine (NORVASC) 10 MG tablet TAKE 1 TABLET EVERY DAY 90 tablet 1  ? aspirin EC 81 MG tablet Take 81 mg by mouth every morning.    ? busPIRone (BUSPAR) 5 MG tablet Take 5 mg by mouth 2 (two) times daily.    ? cloNIDine (CATAPRES) 0.1 MG tablet Take 1 tablet (0.1 mg total) by mouth daily. 30 tablet 3  ? diclofenac (VOLTAREN) 50 MG EC tablet Take 1 tablet (50 mg total) by mouth 2 (two) times daily. 60 tablet 2  ? diclofenac Sodium (VOLTAREN) 1 % GEL Apply 4 g topically 2 (two) times daily. 2 g 0  ? ezetimibe (ZETIA) 10 MG tablet Take 1 tablet by mouth once daily 90 tablet 0  ? fenofibrate (TRICOR) 48 MG tablet Take 1 tablet (48 mg total) by mouth daily. 30 tablet 5  ? FLUoxetine (PROZAC) 40 MG capsule Take 40 mg by mouth daily.    ? glucose blood test strip Use as instructed once daily testing dx e11.9 100 each 3  ? hydrochlorothiazide (HYDRODIURIL) 25 MG tablet Take 1 tablet (25 mg total) by mouth daily. 30 tablet 5  ? LANTUS SOLOSTAR 100 UNIT/ML Solostar Pen INJECT 45 UNITS UNDER THE SKIN EVERY MORNING (DOSE INCREASE) (Patient taking differently: Inject 55 Units into the skin every evening. 2330) 45 mL 3  ? montelukast (SINGULAIR) 10 MG tablet Take 1 tablet (10 mg total) by mouth daily. 30 tablet 3  ? olmesartan (BENICAR) 20 MG tablet Take 1 tablet (20 mg total) by mouth daily. 30 tablet 3  ? rosuvastatin (CRESTOR) 5 MG tablet TAKE 1 TABLET EVERY MONDAY, WEDNESDAY AND FRIDAY (DISCONTINUE PRAVASTATIN) 36 tablet 3  ? ?No current facility-administered medications for this visit.  ? ? ?ALLERGIES:  ?Allergies  ?Allergen Reactions  ? Ibuprofen Rash  ? Lisinopril Cough  ? Metformin And Related Cough  ? Strawberry [Berry] Hives and Swelling  ?  TONGUE SWELLING  ? ? ?PHYSICAL EXAM:  ?Performance status (ECOG): 1 - Symptomatic but completely ambulatory ? ?There were no vitals filed for this visit. ?Wt Readings from Last 3 Encounters:  ?01/15/22 207 lb (93.9 kg)  ?11/16/21 214 lb 6.4 oz (97.3 kg)   ?10/29/21 215 lb (97.5 kg)  ? ?Physical Exam ?Vitals reviewed.  ?Constitutional:   ?   Appearance: Normal appearance. He is obese.  ?Cardiovascular:  ?   Rate and Rhythm: Normal rate and regular rhythm.  ?   Pulses: Normal pulses.  ?   Heart sounds: Normal heart sounds.  ?Pulmonary:  ?   Effort: Pulmonary effort is normal.  ?   Breath sounds: Normal breath sounds.  ?Neurological:  ?   General: No focal deficit present.  ?   Mental  Status: He is alert and oriented to person, place, and time.  ?Psychiatric:     ?   Mood and Affect: Mood normal.     ?   Behavior: Behavior normal.  ? ? ?LABORATORY DATA:  ?I have reviewed the labs as listed.  ? ?  Latest Ref Rng & Units 02/08/2022  ?  1:23 PM 10/12/2021  ? 10:05 PM 10/08/2021  ?  1:04 PM  ?CBC  ?WBC 4.0 - 10.5 K/uL 11.5   10.3   9.8    ?Hemoglobin 13.0 - 17.0 g/dL 15.4   14.9   14.8    ?Hematocrit 39.0 - 52.0 % 46.2   47.1   47.1    ?Platelets 150 - 400 K/uL 307   304   295    ? ? ?  Latest Ref Rng & Units 12/28/2021  ? 11:33 AM 10/12/2021  ? 10:05 PM 08/07/2021  ? 10:18 AM  ?CMP  ?Glucose 70 - 99 mg/dL 315   320   134    ?BUN 8 - 27 mg/dL '16   18   16    '$ ?Creatinine 0.76 - 1.27 mg/dL 1.58   1.57   1.36    ?Sodium 134 - 144 mmol/L 135   136   138    ?Potassium 3.5 - 5.2 mmol/L 4.7   4.5   4.4    ?Chloride 96 - 106 mmol/L 95   104   106    ?CO2 20 - 29 mmol/L '26   23   26    '$ ?Calcium 8.6 - 10.2 mg/dL 10.2   9.0   9.4    ?Total Protein 6.0 - 8.5 g/dL 7.3      ?Total Bilirubin 0.0 - 1.2 mg/dL 0.4      ?Alkaline Phos 44 - 121 IU/L 92      ?AST 0 - 40 IU/L 11      ?ALT 0 - 44 IU/L 20      ? ?   ?Component Value Date/Time  ? RBC 5.22 02/08/2022 1323  ? MCV 88.5 02/08/2022 1323  ? MCV 107 (H) 07/17/2021 1058  ? MCH 29.5 02/08/2022 1323  ? MCHC 33.3 02/08/2022 1323  ? RDW 14.1 02/08/2022 1323  ? RDW 20.7 (H) 07/17/2021 1058  ? LYMPHSABS 3.3 02/08/2022 1323  ? LYMPHSABS 3.1 07/17/2021 1058  ? MONOABS 0.9 02/08/2022 1323  ? EOSABS 0.3 02/08/2022 1323  ? EOSABS 0.5 (H) 07/17/2021 1058  ?  BASOSABS 0.1 02/08/2022 1323  ? BASOSABS 0.0 07/17/2021 1058  ? ? ?DIAGNOSTIC IMAGING:  ?I have independently reviewed the scans and discussed with the patient. ?No results found.  ? ?ASSESSMENT:  ?Macrocy

## 2022-02-15 NOTE — Patient Instructions (Signed)
[place] at [facility] ?Discharge Instructions ? ?You were seen and examined today by Dr. Delton Coombes. He reviewed your most recent labs and your kidney function is elevated slightly. Increase your water intake to help with this and try not to take NSAIDs like Aleve or ibuprofen. Start taking Vitamin B 12 1000 mcg over the counter daily. Please keep follow up appointments as scheduled in 6 months. ? ?You received your Vitamin B 12 injection today. ? ? ?Thank you for choosing [place] at [facility] to provide your oncology and hematology care.  To afford each patient quality time with our provider, please arrive at least 15 minutes before your scheduled appointment time.  ? ?If you have a lab appointment with the [facility] please come in thru the Main Entrance and check in at the main information desk. ? ?You need to re-schedule your appointment should you arrive 10 or more minutes late.  We strive to give you quality time with our providers, and arriving late affects you and other patients whose appointments are after yours.  Also, if you no show three or more times for appointments you may be dismissed from the clinic at the providers discretion.     ?Again, thank you for choosing [facility]  Our hope is that these requests will decrease the amount of time that you wait before being seen by our physicians.       ?_____________________________________________________________ ? ?Should you have questions after your visit to [facility], please contact our office at [phone] and follow the prompts.  Our office hours are 8:00 a.m. and 4:30 p.m. Monday - Friday.  Please note that voicemails left after 4:00 p.m. may not be returned until the following business day.  We are closed weekends and major holidays.  You do have access to a nurse 24-7, just call the main number to the clinic [phone] and do not press any options, hold on  the line and a nurse will answer the phone.   ? ?For prescription refill requests, have your pharmacy contact our office and allow 72 hours.   ? ?Due to Covid, you will need to wear a mask upon entering the hospital. If you do not have a mask, a mask will be given to you at the Main Entrance upon arrival. For doctor visits, patients may have 1 support person age 28 or older with them. For treatment visits, patients can not have anyone with them due to social distancing guidelines and our immunocompromised population.  ? ?  ?

## 2022-02-15 NOTE — Patient Instructions (Signed)
[facility]  Discharge Instructions: ?Thank you for choosing [facility] to provide your oncology and hematology care.  ?If you have a lab appointment with the [facility], please come in thru the Main Entrance and check in at the main information desk. ? ?Wear comfortable clothing and clothing appropriate for easy access to any Portacath or PICC line.  ? ?We strive to give you quality time with your provider. You may need to reschedule your appointment if you arrive late (15 or more minutes).  Arriving late affects you and other patients whose appointments are after yours.  Also, if you miss three or more appointments without notifying the office, you may be dismissed from the clinic at the provider?s discretion.    ?  ?For prescription refill requests, have your pharmacy contact our office and allow 72 hours for refills to be completed.   ? ?Today you received the following chemotherapy and/or immunotherapy agents Vitamin B12.   ?  ?To help prevent nausea and vomiting after your treatment, we encourage you to take your nausea medication as directed. ? ?BELOW ARE SYMPTOMS THAT SHOULD BE REPORTED IMMEDIATELY: ?*FEVER GREATER THAN 100.4 F (38 ?C) OR HIGHER ?*CHILLS OR SWEATING ?*NAUSEA AND VOMITING THAT IS NOT CONTROLLED WITH YOUR NAUSEA MEDICATION ?*UNUSUAL SHORTNESS OF BREATH ?*UNUSUAL BRUISING OR BLEEDING ?*URINARY PROBLEMS (pain or burning when urinating, or frequent urination) ?*BOWEL PROBLEMS (unusual diarrhea, constipation, pain near the anus) ?TENDERNESS IN MOUTH AND THROAT WITH OR WITHOUT PRESENCE OF ULCERS (sore throat, sores in mouth, or a toothache) ?UNUSUAL RASH, SWELLING OR PAIN  ?UNUSUAL VAGINAL DISCHARGE OR ITCHING  ? ?Items with * indicate a potential emergency and should be followed up as soon as possible or go to the Emergency Department if any problems should occur. ? ?Please show the CHEMOTHERAPY ALERT CARD or IMMUNOTHERAPY ALERT CARD at check-in to the Emergency  Department and triage nurse. ? ?Should you have questions after your visit or need to cancel or reschedule your appointment, please contact [facility] [phone]  and follow the prompts.  Office hours are 8:00 a.m. to 4:30 p.m. Monday - Friday. Please note that voicemails left after 4:00 p.m. may not be returned until the following business day.  We are closed weekends and major holidays. You have access to a nurse at all times for urgent questions. Please call the main number to the clinic [phone] and follow the prompts. ? ?For any non-urgent questions, you may also contact your provider using MyChart. We now offer e-Visits for anyone 22 and older to request care online for non-urgent symptoms. For details visit [email or web address]. ?  ?Also download the MyChart app! Go to the app store, search "MyChart", open the app, select [facility], and log in with your MyChart username and password. ? ?Due to Covid, a mask is required upon entering the hospital/clinic. If you do not have a mask, one will be given to you upon arrival. For doctor visits, patients may have 1 support person aged 21 or older with them. For treatment visits, patients cannot have anyone with them due to current Covid guidelines and our immunocompromised population.  ?

## 2022-02-18 NOTE — Patient Instructions (Incomplete)

## 2022-02-19 ENCOUNTER — Ambulatory Visit: Payer: Medicare HMO | Admitting: Nurse Practitioner

## 2022-02-19 DIAGNOSIS — E782 Mixed hyperlipidemia: Secondary | ICD-10-CM

## 2022-02-19 DIAGNOSIS — I10 Essential (primary) hypertension: Secondary | ICD-10-CM

## 2022-02-19 DIAGNOSIS — Z794 Long term (current) use of insulin: Secondary | ICD-10-CM

## 2022-02-24 ENCOUNTER — Ambulatory Visit (INDEPENDENT_AMBULATORY_CARE_PROVIDER_SITE_OTHER): Payer: Medicare HMO | Admitting: Orthopedic Surgery

## 2022-02-24 ENCOUNTER — Encounter: Payer: Self-pay | Admitting: Orthopedic Surgery

## 2022-02-24 DIAGNOSIS — M170 Bilateral primary osteoarthritis of knee: Secondary | ICD-10-CM | POA: Diagnosis not present

## 2022-02-24 DIAGNOSIS — G8929 Other chronic pain: Secondary | ICD-10-CM

## 2022-02-24 NOTE — Patient Instructions (Addendum)
We will send a surgical clearance to Dr. Moshe Cipro for a clearance for a total knee replacement. ? ? ?

## 2022-02-24 NOTE — Progress Notes (Signed)
FOLLOW UP  ? ?Encounter Diagnoses  ?Name Primary?  ? Chronic pain of left knee Yes  ? Chronic pain of right knee   ? Primary osteoarthritis of both knees   ? ? ? ?Chief Complaint  ?Patient presents with  ? Knee Pain  ?  Bilateral/ follow up ?Pain is the same  ? ? ?Norman Peters is here in follow-up he is 66 years old he had 2 injections 1 in each knee on October 26, 2021 he had previously been on diclofenac he was placed on 50 mg of diclofenac twice a day he has diabetes and hypertension he uses insulin he has grade 3 chronic kidney disease. ? ?He has not improved ? ?His x-rays show osteoarthritis both knees right is worse than the left he has pain in the right worse than the left he is using a cane ? ?I recommended to him that if the medications injections and use of a cane is not controlling his pain then he should consider total knee surgery ? ?He will need a preop medical clearance from Dr. Moshe Cipro and then we can talk to him and his wife about the surgery and his preop visit ? ?He has 8 steps in the front of his house none in the back his wife is able to take care of him after surgery if he chooses that route ? ?We will see him after Dr. Griffin Dakin evaluation for preop medical clearance ?

## 2022-02-26 ENCOUNTER — Other Ambulatory Visit: Payer: Self-pay | Admitting: Nurse Practitioner

## 2022-02-26 DIAGNOSIS — J302 Other seasonal allergic rhinitis: Secondary | ICD-10-CM

## 2022-03-02 ENCOUNTER — Encounter: Payer: Self-pay | Admitting: Orthopedic Surgery

## 2022-03-04 ENCOUNTER — Ambulatory Visit: Payer: Medicare HMO | Admitting: Family Medicine

## 2022-03-04 ENCOUNTER — Encounter: Payer: Self-pay | Admitting: Family Medicine

## 2022-03-04 ENCOUNTER — Ambulatory Visit (INDEPENDENT_AMBULATORY_CARE_PROVIDER_SITE_OTHER): Payer: Medicare HMO | Admitting: Family Medicine

## 2022-03-04 VITALS — BP 134/80 | HR 88 | Resp 16 | Ht 68.0 in | Wt 213.0 lb

## 2022-03-04 DIAGNOSIS — E782 Mixed hyperlipidemia: Secondary | ICD-10-CM | POA: Diagnosis not present

## 2022-03-04 DIAGNOSIS — Z01818 Encounter for other preprocedural examination: Secondary | ICD-10-CM | POA: Diagnosis not present

## 2022-03-04 DIAGNOSIS — E559 Vitamin D deficiency, unspecified: Secondary | ICD-10-CM | POA: Diagnosis not present

## 2022-03-04 DIAGNOSIS — Z794 Long term (current) use of insulin: Secondary | ICD-10-CM | POA: Diagnosis not present

## 2022-03-04 DIAGNOSIS — I10 Essential (primary) hypertension: Secondary | ICD-10-CM | POA: Diagnosis not present

## 2022-03-04 DIAGNOSIS — E1165 Type 2 diabetes mellitus with hyperglycemia: Secondary | ICD-10-CM

## 2022-03-04 LAB — POCT URINALYSIS DIP (CLINITEK)
Bilirubin, UA: NEGATIVE
Blood, UA: NEGATIVE
Glucose, UA: 250 mg/dL — AB
Leukocytes, UA: NEGATIVE
Nitrite, UA: NEGATIVE
POC PROTEIN,UA: 100 — AB
Spec Grav, UA: 1.025 (ref 1.010–1.025)
Urobilinogen, UA: 0.2 E.U./dL
pH, UA: 5.5 (ref 5.0–8.0)

## 2022-03-04 LAB — POCT GLYCOSYLATED HEMOGLOBIN (HGB A1C): HbA1c, POC (controlled diabetic range): 10.7 % — AB (ref 0.0–7.0)

## 2022-03-04 NOTE — Progress Notes (Signed)
? ?  Croy Drumwright     MRN: [id number]      DOB: January 21, 1956 ? ? ?HPI ?Mr. Nabozny is here for fpre op exam for knee replacement ?His only c/o uncontrolled knee pain and instability, both knees , but left is worse tha right ?States blood sugar continues to flctuate ?Denies polyuria, polydipsia, blurred vision , or hypoglycemic episodes. ? ? ?ROS ?Denies recent fever or chills. ?Denies sinus pressure, nasal congestion, ear pain or sore throat. ?Denies chest congestion, productive cough or wheezing. ?Denies chest pains, palpitations and leg swelling ?Denies abdominal pain, nausea, vomiting,diarrhea or constipation.   ?Denies dysuria, frequency, hesitancy or incontinence. ?Denies headaches, seizures, numbness, or tingling. ?Denies depression, anxiety or insomnia. ?Denies skin break down or rash. ? ? ?PE ? ?BP (!) 136/54   Pulse 88   Resp 16   Ht '5\' 8"'$  (1.727 m)   Wt 213 lb (96.6 kg)   SpO2 92%   BMI 32.39 kg/m?  ? ?Patient alert and oriented and in no cardiopulmonary distress. ? ?HEENT: No facial asymmetry, EOMI,     Neck supple . ? ?Chest: Clear to auscultation bilaterally. ? ?CVS: S1, S2 no murmurs, no S3.Regular rate. ? ?ABD: Soft non tender.  ? ?Ext: No edema ? Though reduced ROM spine,  normal in shoulders, hips and mrkedly decreased in knees. ? ?Skin: Intact, no ulcerations or rash noted. ? ?Psych: Good eye contact, normal affect. Memory intact not anxious or depressed appearing. ? ?CNS: CN 2-12 intact, power,  normal throughout.no focal deficits noted. ? ? ?Assessment & Plan ? ?Pre-op evaluation ?Visit as documented ?EKG: sinus rhythm , no ischemia, no LVH ?CCUA negative for infection ? GlycoHB over 9, needs improved blood sugar for medical clearance ?Will hold on CXR at this time ?Urgent appt requested for Endo who treats his diabetes ?NOT medically cleared currently due to uncontrolled dabetes ? ?

## 2022-03-04 NOTE — Patient Instructions (Addendum)
F/u as before, call if you need me sooner ? ?Blood sugar  will be evaluated for level of control.Very uncontrolled, you are unable to get the surgery now as a result of this and I am referring you urgently to Dr Liliane Channel office for management of your diabetes ? ? ? ?CCUA today  is normal ? ?EKG is within normal ? ?CMP and EGFR, tSh and vit D today ? ? ?Thanks for choosing [facility], we consider it a privelige to serve you. ? ?

## 2022-03-05 LAB — TSH: TSH: 1.3 u[IU]/mL (ref 0.450–4.500)

## 2022-03-05 LAB — CMP14+EGFR
ALT: 12 IU/L (ref 0–44)
AST: 9 IU/L (ref 0–40)
Albumin/Globulin Ratio: 1.4 (ref 1.2–2.2)
Albumin: 4.2 g/dL (ref 3.8–4.8)
Alkaline Phosphatase: 83 IU/L (ref 44–121)
BUN/Creatinine Ratio: 14 (ref 10–24)
BUN: 25 mg/dL (ref 8–27)
Bilirubin Total: 0.3 mg/dL (ref 0.0–1.2)
CO2: 25 mmol/L (ref 20–29)
Calcium: 9.8 mg/dL (ref 8.6–10.2)
Chloride: 101 mmol/L (ref 96–106)
Creatinine, Ser: 1.83 mg/dL — ABNORMAL HIGH (ref 0.76–1.27)
Globulin, Total: 3 g/dL (ref 1.5–4.5)
Glucose: 274 mg/dL — ABNORMAL HIGH (ref 70–99)
Potassium: 4.7 mmol/L (ref 3.5–5.2)
Sodium: 140 mmol/L (ref 134–144)
Total Protein: 7.2 g/dL (ref 6.0–8.5)
eGFR: 40 mL/min/{1.73_m2} — ABNORMAL LOW (ref >59)

## 2022-03-05 LAB — VITAMIN D 25 HYDROXY (VIT D DEFICIENCY, FRACTURES): Vit D, 25-Hydroxy: 51.9 ng/mL (ref 30.0–100.0)

## 2022-03-05 NOTE — Assessment & Plan Note (Signed)
Visit as documented ?EKG: sinus rhythm , no ischemia, no LVH ?CCUA negative for infection ? GlycoHB over 9, needs improved blood sugar for medical clearance ?Will hold on CXR at this time ?Urgent appt requested for Endo who treats his diabetes ?NOT medically cleared currently due to uncontrolled dabetes ?

## 2022-03-09 ENCOUNTER — Encounter: Payer: Medicare HMO | Admitting: Nurse Practitioner

## 2022-03-09 VITALS — Wt 217.0 lb

## 2022-03-09 NOTE — Progress Notes (Signed)
Erroneous encounter

## 2022-03-09 NOTE — Patient Instructions (Signed)
Diabetes Mellitus and Foot Care Foot care is an important part of your health, especially when you have diabetes. Diabetes may cause you to have problems because of poor blood flow (circulation) to your feet and legs, which can cause your skin to: Become thinner and drier. Break more easily. Heal more slowly. Peel and crack. You may also have nerve damage (neuropathy) in your legs and feet, causing decreased feeling in them. This means that you may not notice minor injuries to your feet that could lead to more serious problems. Noticing and addressing any potential problems early is the best way to prevent future foot problems. How to care for your feet Foot hygiene  Wash your feet daily with warm water and mild soap. Do not use hot water. Then, pat your feet and the areas between your toes until they are completely dry. Do not soak your feet as this can dry your skin. Trim your toenails straight across. Do not dig under them or around the cuticle. File the edges of your nails with an emery board or nail file. Apply a moisturizing lotion or petroleum jelly to the skin on your feet and to dry, brittle toenails. Use lotion that does not contain alcohol and is unscented. Do not apply lotion between your toes. Shoes and socks Wear clean socks or stockings every day. Make sure they are not too tight. Do not wear knee-high stockings since they may decrease blood flow to your legs. Wear shoes that fit properly and have enough cushioning. Always look in your shoes before you put them on to be sure there are no objects inside. To break in new shoes, wear them for just a few hours a day. This prevents injuries on your feet. Wounds, scrapes, corns, and calluses  Check your feet daily for blisters, cuts, bruises, sores, and redness. If you cannot see the bottom of your feet, use a mirror or ask someone for help. Do not cut corns or calluses or try to remove them with medicine. If you find a minor scrape,  cut, or break in the skin on your feet, keep it and the skin around it clean and dry. You may clean these areas with mild soap and water. Do not clean the area with peroxide, alcohol, or iodine. If you have a wound, scrape, corn, or callus on your foot, look at it several times a day to make sure it is healing and not infected. Check for: Redness, swelling, or pain. Fluid or blood. Warmth. Pus or a bad smell. General tips Do not cross your legs. This may decrease blood flow to your feet. Do not use heating pads or hot water bottles on your feet. They may burn your skin. If you have lost feeling in your feet or legs, you may not know this is happening until it is too late. Protect your feet from hot and cold by wearing shoes, such as at the beach or on hot pavement. Schedule a complete foot exam at least once a year (annually) or more often if you have foot problems. Report any cuts, sores, or bruises to your health care provider immediately. Where to find more information American Diabetes Association: www.diabetes.org Association of Diabetes Care & Education Specialists: www.diabeteseducator.org Contact a health care provider if: You have a medical condition that increases your risk of infection and you have any cuts, sores, or bruises on your feet. You have an injury that is not healing. You have redness on your legs or feet. You   feel burning or tingling in your legs or feet. You have pain or cramps in your legs and feet. Your legs or feet are numb. Your feet always feel cold. You have pain around any toenails. Get help right away if: You have a wound, scrape, corn, or callus on your foot and: You have pain, swelling, or redness that gets worse. You have fluid or blood coming from the wound, scrape, corn, or callus. Your wound, scrape, corn, or callus feels warm to the touch. You have pus or a bad smell coming from the wound, scrape, corn, or callus. You have a fever. You have a red  line going up your leg. Summary Check your feet every day for blisters, cuts, bruises, sores, and redness. Apply a moisturizing lotion or petroleum jelly to the skin on your feet and to dry, brittle toenails. Wear shoes that fit properly and have enough cushioning. If you have foot problems, report any cuts, sores, or bruises to your health care provider immediately. Schedule a complete foot exam at least once a year (annually) or more often if you have foot problems. This information is not intended to replace advice given to you by your health care provider. Make sure you discuss any questions you have with your health care provider. Document Revised: 05/15/2020 Document Reviewed: 05/15/2020 Elsevier Patient Education  2023 [employer].  

## 2022-03-15 ENCOUNTER — Other Ambulatory Visit: Payer: Self-pay | Admitting: Orthopedic Surgery

## 2022-03-25 ENCOUNTER — Encounter: Payer: Self-pay | Admitting: Nurse Practitioner

## 2022-03-25 ENCOUNTER — Ambulatory Visit (INDEPENDENT_AMBULATORY_CARE_PROVIDER_SITE_OTHER): Payer: Medicare HMO | Admitting: Nurse Practitioner

## 2022-03-25 VITALS — BP 135/80 | HR 89 | Ht 68.0 in | Wt 212.0 lb

## 2022-03-25 DIAGNOSIS — N1832 Chronic kidney disease, stage 3b: Secondary | ICD-10-CM | POA: Diagnosis not present

## 2022-03-25 DIAGNOSIS — I10 Essential (primary) hypertension: Secondary | ICD-10-CM

## 2022-03-25 DIAGNOSIS — Z794 Long term (current) use of insulin: Secondary | ICD-10-CM

## 2022-03-25 DIAGNOSIS — E782 Mixed hyperlipidemia: Secondary | ICD-10-CM

## 2022-03-25 DIAGNOSIS — E1122 Type 2 diabetes mellitus with diabetic chronic kidney disease: Secondary | ICD-10-CM | POA: Diagnosis not present

## 2022-03-25 LAB — POCT UA - MICROALBUMIN
Creatinine, POC: 300 mg/dL
Microalbumin Ur, POC: 150 mg/L

## 2022-03-25 NOTE — Progress Notes (Signed)
Endocrinology follow-up note    03/25/2022, 4:24 PM   Subjective:    Patient ID: Norman Peters, male    DOB: Nov 23, 1955.  Kenneth Cuaresma is being seen in follow-up after he was seen in consultation for management of currently uncontrolled symptomatic diabetes requested by  Fayrene Helper, MD.   Past Medical History:  Diagnosis Date   Acute left-sided low back pain with left-sided sciatica 11/04/2020   Anxiety    Arthritis    Depression 2001   Diabetes mellitus 2010   GERD (gastroesophageal reflux disease)    Hyperlipidemia    Hypertension 2005    Past Surgical History:  Procedure Laterality Date   COLONOSCOPY N/A 07/07/2016   Procedure: COLONOSCOPY;  Surgeon: Rogene Houston, MD;  Location: [facility];  Service: Endoscopy;  Laterality: N/A;  1030   FINGER SURGERY Left 1974   fifth , trauma on job, had to replace/repair finger   HERNIA REPAIR  5573   x3, umbilical and bilateral hernia    Social History   Socioeconomic History   Marital status: Married    Spouse name: Not on file   Number of children: Not on file   Years of education: Not on file   Highest education level: Not on file  Occupational History   Not on file  Tobacco Use   Smoking status: Former    Packs/day: 0.50    Years: 30.00    Pack years: 15.00    Types: Cigarettes    Quit date: 01/20/2011    Years since quitting: 11.1   Smokeless tobacco: Never  Vaping Use   Vaping Use: Never used  Substance and Sexual Activity   Alcohol use: No   Drug use: No   Sexual activity: Not Currently  Other Topics Concern   Not on file  Social History Narrative   Not on file   Social Determinants of Health   Financial Resource Strain: Low Risk    Difficulty of Paying Living Expenses: Not hard at all  Food Insecurity: No Food Insecurity   Worried About Charity fundraiser in the Last Year: Never true   [place] in  the Last Year: Never true  Transportation Needs: No Transportation Needs   Lack of Transportation (Medical): No   Lack of Transportation (Non-Medical): No  Physical Activity: Insufficiently Active   Days of Exercise per Week: 5 days   Minutes of Exercise per Session: 20 min  Stress: No Stress Concern Present   Feeling of Stress : Not at all  Social Connections: Socially Integrated   Frequency of Communication with Friends and Family: More than three times a week   Frequency of Social Gatherings with Friends and Family: Once a week   Attends Religious Services: More than 4 times per year   Active Member of [employer] or Organizations: Yes   Attends Archivist Meetings: More than 4 times per year   Marital Status: Married    Family History  Problem Relation Age of Onset   Stroke Mother     Outpatient Encounter Medications as of 03/25/2022  Medication Sig  diclofenac (VOLTAREN) 50 MG EC tablet Take 1 tablet by mouth twice daily   acetaminophen (TYLENOL) 325 MG tablet Take 650 mg by mouth every 6 (six) hours as needed.   albuterol (VENTOLIN HFA) 108 (90 Base) MCG/ACT inhaler Inhale 2 puffs into the lungs daily as needed for wheezing or shortness of breath.   amLODipine (NORVASC) 10 MG tablet TAKE 1 TABLET EVERY DAY   aspirin EC 81 MG tablet Take 81 mg by mouth every morning.   busPIRone (BUSPAR) 5 MG tablet Take 5 mg by mouth 2 (two) times daily.   diclofenac Sodium (VOLTAREN) 1 % GEL Apply 4 g topically 2 (two) times daily.   fenofibrate (TRICOR) 48 MG tablet Take 1 tablet (48 mg total) by mouth daily.   FLUoxetine (PROZAC) 40 MG capsule Take 40 mg by mouth daily.   glucose blood test strip Use as instructed once daily testing dx e11.9   hydrochlorothiazide (HYDRODIURIL) 25 MG tablet Take 1 tablet (25 mg total) by mouth daily.   LANTUS SOLOSTAR 100 UNIT/ML Solostar Pen INJECT 45 UNITS UNDER THE SKIN EVERY MORNING (DOSE INCREASE) (Patient taking differently: Inject 55 Units into  the skin every evening. 2330)   montelukast (SINGULAIR) 10 MG tablet Take 1 tablet by mouth once daily   olmesartan (BENICAR) 20 MG tablet Take 1 tablet (20 mg total) by mouth daily.   rosuvastatin (CRESTOR) 5 MG tablet TAKE 1 TABLET EVERY MONDAY, WEDNESDAY AND FRIDAY (DISCONTINUE PRAVASTATIN)   No facility-administered encounter medications on file as of 03/25/2022.    ALLERGIES: Allergies  Allergen Reactions   Ibuprofen Rash   Lisinopril Cough   Strawberry [Berry] Hives and Swelling    TONGUE SWELLING    VACCINATION STATUS: Immunization History  Administered Date(s) Administered   Fluad Quad(high Dose 65+) 09/02/2020, 07/17/2021   Influenza,inj,Quad PF,6+ Mos 07/14/2016, 08/25/2017, 08/30/2018, 07/02/2019   PFIZER(Purple Top)SARS-COV-2 Vaccination 01/25/2020, 02/19/2020, 09/28/2020, 05/29/2021, 09/25/2021   PNEUMOCOCCAL CONJUGATE-20 07/17/2021   Pfizer Covid-19 Vaccine Bivalent Booster 54yr & up 09/25/2021   Pneumococcal Polysaccharide-23 02/10/2016   Tdap 07/04/2021   Zoster Recombinat (Shingrix) 07/08/2021, 09/09/2021    Diabetes He presents for his follow-up diabetic visit. He has type 2 diabetes mellitus. Onset time: He was diagnosed at approximate age of 89years. His disease course has been fluctuating. There are no hypoglycemic associated symptoms. Pertinent negatives for hypoglycemia include no confusion, headaches, pallor or seizures. Associated symptoms include fatigue. Pertinent negatives for diabetes include no blurred vision, no chest pain, no polydipsia, no polyphagia, no polyuria, no weakness and no weight loss. Hypoglycemia complications include nocturnal hypoglycemia. Symptoms are improving. Diabetic complications include nephropathy and peripheral neuropathy. Risk factors for coronary artery disease include diabetes mellitus, dyslipidemia, obesity, hypertension, male sex, tobacco exposure and sedentary lifestyle. Current diabetic treatment includes insulin  injections. He is compliant with treatment most of the time. His weight is fluctuating minimally. He is following a generally healthy diet. When asked about meal planning, he reported none. He has had a previous visit with a dietitian. He rarely participates in exercise. His home blood glucose trend is fluctuating minimally. His breakfast blood glucose range is generally 140-180 mg/dl. His bedtime blood glucose range is generally 140-180 mg/dl. (He presents today with his logs, no meter, showing above target glycemic profile overall.  His most recent A1c on 4/27 was 10.7%, worsening form last visit here of 7.8%, which is preventing him from having knee surgery.  It appears he may be dumping at night as he denies night time  snacking and his morning readings are sometimes higher.  He knows he needs to work on his diet.) An ACE inhibitor/angiotensin II receptor blocker is not being taken. He does not see a podiatrist.Eye exam is current.  Hyperlipidemia This is a chronic problem. The current episode started more than 1 year ago. The problem is uncontrolled. Recent lipid tests were reviewed and are variable. Exacerbating diseases include chronic renal disease, diabetes and obesity. Factors aggravating his hyperlipidemia include fatty foods. Pertinent negatives include no chest pain, myalgias or shortness of breath. Current antihyperlipidemic treatment includes statins. The current treatment provides mild improvement of lipids. Compliance problems include adherence to exercise and adherence to diet.  Risk factors for coronary artery disease include diabetes mellitus, dyslipidemia, male sex, obesity, hypertension and a sedentary lifestyle.  Hypertension This is a chronic problem. The current episode started more than 1 year ago. The problem is unchanged. The problem is controlled. Pertinent negatives include no blurred vision, chest pain, headaches, neck pain, palpitations or shortness of breath. There are no  associated agents to hypertension. Risk factors for coronary artery disease include diabetes mellitus, male gender, smoking/tobacco exposure, sedentary lifestyle, obesity and dyslipidemia. Past treatments include calcium channel blockers and diuretics. The current treatment provides mild improvement. There are no compliance problems.  Hypertensive end-organ damage includes kidney disease. Identifiable causes of hypertension include chronic renal disease.   Review of systems  Constitutional: + Minimally fluctuating body weight,  current Body mass index is 32.23 kg/m. , + fatigue, no subjective hyperthermia, no subjective hypothermia Eyes: no blurry vision, no xerophthalmia ENT: no sore throat, no nodules palpated in throat, no dysphagia/odynophagia, no hoarseness Cardiovascular: no chest pain, no shortness of breath, no palpitations, no leg swelling Respiratory: no cough, no shortness of breath Gastrointestinal: no nausea/vomiting/diarrhea Musculoskeletal: generalized aches and pains- taking OTC Tylenol and seeing chiropractor, walks with cane Skin: no rashes, no hyperemia, has dark ring around his left foot Neurological: no tremors, + numbness/tingling to LLE, no dizziness Psychiatric: no depression, no anxiety   Objective:    BP 135/80   Pulse 89   Ht '5\' 8"'$  (1.727 m)   Wt 212 lb (96.2 kg)   BMI 32.23 kg/m   Wt Readings from Last 3 Encounters:  03/25/22 212 lb (96.2 kg)  03/09/22 217 lb (98.4 kg)  03/04/22 213 lb (96.6 kg)    BP Readings from Last 3 Encounters:  03/25/22 135/80  03/04/22 134/80  02/15/22 (!) 145/88     Physical Exam- Limited  Constitutional:  Body mass index is 32.23 kg/m. , not in acute distress, normal state of mind Eyes:  EOMI, no exophthalmos Neck: Supple Cardiovascular: RRR, no murmurs, rubs, or gallops, no edema Respiratory: Adequate breathing efforts, no crackles, rales, rhonchi, or wheezing Musculoskeletal: no gross deformities, strength intact in  all four extremities, no gross restriction of joint movements, walks with cane Skin:  no rashes, no hyperemia Neurological: no tremor with outstretched hands     CMP     Component Value Date/Time   NA 140 03/04/2022 1435   K 4.7 03/04/2022 1435   CL 101 03/04/2022 1435   CO2 25 03/04/2022 1435   GLUCOSE 274 (H) 03/04/2022 1435   GLUCOSE 320 (H) 10/12/2021 2205   BUN 25 03/04/2022 1435   CREATININE 1.83 (H) 03/04/2022 1435   CREATININE 1.47 (H) 08/25/2020 0927   CALCIUM 9.8 03/04/2022 1435   PROT 7.2 03/04/2022 1435   ALBUMIN 4.2 03/04/2022 1435   AST 9 03/04/2022 1435  ALT 12 03/04/2022 1435   ALKPHOS 83 03/04/2022 1435   BILITOT 0.3 03/04/2022 1435   GFRNONAA 49 (L) 10/12/2021 2205   GFRNONAA 50 (L) 08/25/2020 0927   GFRAA 61 12/10/2020 0933   GFRAA 58 (L) 08/25/2020 0927     Diabetic Labs (most recent): Lab Results  Component Value Date   HGBA1C 10.7 (A) 03/04/2022   HGBA1C 7.8 (A) 10/21/2021   HGBA1C 11.7 (A) 06/17/2021     Lipid Panel     Component Value Date/Time   CHOL 95 (L) 12/28/2021 1133   TRIG 233 (H) 12/28/2021 1133   HDL 30 (L) 12/28/2021 1133   CHOLHDL 3.2 12/28/2021 1133   CHOLHDL 3.4 08/25/2020 0927   VLDL 39 (H) 05/14/2017 0831   LDLCALC 29 12/28/2021 1133   LDLCALC 55 08/25/2020 0927   LABVLDL 36 12/28/2021 1133      Lab Results  Component Value Date   TSH 1.300 03/04/2022   TSH 2.215 07/22/2021   TSH 2.100 06/10/2021   TSH 2.79 08/25/2020   TSH 1.96 08/24/2019   TSH 1.34 01/31/2018   TSH 1.61 02/10/2016   FREET4 1.14 06/10/2021   FREET4 1.0 08/25/2020     Assessment & Plan:   1) Type 2 diabetes mellitus with stage 3a chronic kidney disease, with long-term current use of insulin ([place])  - Destin Vinsant has currently uncontrolled symptomatic type 2 DM since 66 years of age.  He presents today with his logs, no meter, showing above target glycemic profile overall.  His most recent A1c on 4/27 was 10.7%, worsening form last  visit here of 7.8%, which is preventing him from having knee surgery.  It appears he may be dumping at night as he denies night time snacking and his morning readings are sometimes higher.  He knows he needs to work on his diet.   Recent labs reviewed.  - I had a long discussion with him about the progressive nature of diabetes and the pathology behind its complications. -his diabetes is complicated by CKD and he remains at a high risk for more acute and chronic complications which include CAD, CVA, CKD, retinopathy, and neuropathy. These are all discussed in detail with him.  - Nutritional counseling repeated at each appointment due to patients tendency to fall back in to old habits.  - The patient admits there is a room for improvement in their diet and drink choices. -  Suggestion is made for the patient to avoid simple carbohydrates from their diet including Cakes, Sweet Desserts / Pastries, Ice Cream, Soda (diet and regular), Sweet Tea, Candies, Chips, Cookies, Sweet Pastries, Store Bought Juices, Alcohol in Excess of 1-2 drinks a day, Artificial Sweeteners, Coffee Creamer, and "Sugar-free" Products. This will help patient to have stable blood glucose profile and potentially avoid unintended weight gain.   - I encouraged the patient to switch to unprocessed or minimally processed complex starch and increased protein intake (animal or plant source), fruits, and vegetables.   - Patient is advised to stick to a routine mealtimes to eat 3 meals a day and avoid unnecessary snacks (to snack only to correct hypoglycemia).  - I have approached him with the following individualized plan to manage his diabetes and patient agrees:   - he will continue to benefit from basal insulin.    -He is advised to continue his Lantus 55 units SQ nightly given his improvement in his readings in recent weeks.      -He is encouraged to continue monitoring  blood glucose twice daily, before breakfast and before bed,  and to call the clinic if he has readings less than 70 or greater than 200 for 3 tests in a row.  I am concerned about him dropping too low at night causing liver dumping, therefore I ordered him a CGM device and gave him sample Dexcom G7 with receiver today.  - he is warned not to take insulin without proper monitoring per orders.  -He reports that he had developed unexplained cough while taking Glipizide and Metformin.  After he finished his Glipizide and Metformin, reportedly the cough has improved.  He is advised to stay off of Glipizide and Metformin for now.   - he will be considered for incretin therapy as appropriate next visit.  - Specific targets for  A1c;  LDL, HDL,  and Triglycerides were discussed with the patient.  2) Blood Pressure /Hypertension:  His blood pressure is controlled to target but is improving.  he is advised to continue his current medications including Amlodipine 10 mg p.o. daily with breakfast and HCTZ 12.5 mg po daily.  3) Lipids/Hyperlipidemia:   His most recent lipid panel from 12/28/21 shows controlled LDL of 29 and elevated triglycerides of 233 (worsening).  He is advised to continue Crestor 5 mg po every other day at bedtime and Fenofibrate 48 mg po daily.  Side effects and precautions discussed with him.   4)  Weight/Diet:  His Body mass index is 32.23 kg/m.  -   clearly complicating his diabetes care.   he is a candidate for weight loss. I discussed with him the fact that loss of 5 - 10% of his  current body weight will have the most impact on his diabetes management.  Exercise, and detailed carbohydrates information provided  -  detailed on discharge instructions.  5) Chronic Care/Health Maintenance: -he is on a Statin medications and is encouraged to initiate and continue to follow up with Ophthalmology, Dentist, Podiatrist at least yearly or according to recommendations, and advised to stay away from smoking. I have recommended yearly flu vaccine and  pneumonia vaccine at least every 5 years; moderate intensity exercise for up to 150 minutes weekly; and  sleep for at least 7 hours a day.  - he is advised to maintain close follow up with Fayrene Helper, MD for primary care needs, as well as his other providers for optimal and coordinated care.      I spent 40 minutes in the care of the patient today including review of labs from [facility], Lipids, Thyroid Function, Hematology (current and previous including abstractions from other facilities); face-to-face time discussing  his blood glucose readings/logs, discussing hypoglycemia and hyperglycemia episodes and symptoms, medications doses, his options of short and long term treatment based on the latest standards of care / guidelines;  discussion about incorporating lifestyle medicine;  and documenting the encounter.    Please refer to Patient Instructions for Blood Glucose Monitoring and Insulin/Medications Dosing Guide"  in media tab for additional information. Please  also refer to " Patient Self Inventory" in the Media  tab for reviewed elements of pertinent patient history.  [name] participated in the discussions, expressed understanding, and voiced agreement with the above plans.  All questions were answered to his satisfaction. he is encouraged to contact clinic should he have any questions or concerns prior to his return visit.   Follow up plan: - Return in about 3 weeks (around 04/15/2022) for Diabetes F/U, Bring meter and logs.  Rayetta Pigg, [facility] [address] [place], [place] [address] Phone: [phone] Fax: [phone]  03/25/2022, 4:24 PM

## 2022-03-25 NOTE — Patient Instructions (Signed)
Diabetes Mellitus and Foot Care Foot care is an important part of your health, especially when you have diabetes. Diabetes may cause you to have problems because of poor blood flow (circulation) to your feet and legs, which can cause your skin to: Become thinner and drier. Break more easily. Heal more slowly. Peel and crack. You may also have nerve damage (neuropathy) in your legs and feet, causing decreased feeling in them. This means that you may not notice minor injuries to your feet that could lead to more serious problems. Noticing and addressing any potential problems early is the best way to prevent future foot problems. How to care for your feet Foot hygiene  Wash your feet daily with warm water and mild soap. Do not use hot water. Then, pat your feet and the areas between your toes until they are completely dry. Do not soak your feet as this can dry your skin. Trim your toenails straight across. Do not dig under them or around the cuticle. File the edges of your nails with an emery board or nail file. Apply a moisturizing lotion or petroleum jelly to the skin on your feet and to dry, brittle toenails. Use lotion that does not contain alcohol and is unscented. Do not apply lotion between your toes. Shoes and socks Wear clean socks or stockings every day. Make sure they are not too tight. Do not wear knee-high stockings since they may decrease blood flow to your legs. Wear shoes that fit properly and have enough cushioning. Always look in your shoes before you put them on to be sure there are no objects inside. To break in new shoes, wear them for just a few hours a day. This prevents injuries on your feet. Wounds, scrapes, corns, and calluses  Check your feet daily for blisters, cuts, bruises, sores, and redness. If you cannot see the bottom of your feet, use a mirror or ask someone for help. Do not cut corns or calluses or try to remove them with medicine. If you find a minor scrape,  cut, or break in the skin on your feet, keep it and the skin around it clean and dry. You may clean these areas with mild soap and water. Do not clean the area with peroxide, alcohol, or iodine. If you have a wound, scrape, corn, or callus on your foot, look at it several times a day to make sure it is healing and not infected. Check for: Redness, swelling, or pain. Fluid or blood. Warmth. Pus or a bad smell. General tips Do not cross your legs. This may decrease blood flow to your feet. Do not use heating pads or hot water bottles on your feet. They may burn your skin. If you have lost feeling in your feet or legs, you may not know this is happening until it is too late. Protect your feet from hot and cold by wearing shoes, such as at the beach or on hot pavement. Schedule a complete foot exam at least once a year (annually) or more often if you have foot problems. Report any cuts, sores, or bruises to your health care provider immediately. Where to find more information American Diabetes Association: www.diabetes.org Association of Diabetes Care & Education Specialists: www.diabeteseducator.org Contact a health care provider if: You have a medical condition that increases your risk of infection and you have any cuts, sores, or bruises on your feet. You have an injury that is not healing. You have redness on your legs or feet. You   feel burning or tingling in your legs or feet. You have pain or cramps in your legs and feet. Your legs or feet are numb. Your feet always feel cold. You have pain around any toenails. Get help right away if: You have a wound, scrape, corn, or callus on your foot and: You have pain, swelling, or redness that gets worse. You have fluid or blood coming from the wound, scrape, corn, or callus. Your wound, scrape, corn, or callus feels warm to the touch. You have pus or a bad smell coming from the wound, scrape, corn, or callus. You have a fever. You have a red  line going up your leg. Summary Check your feet every day for blisters, cuts, bruises, sores, and redness. Apply a moisturizing lotion or petroleum jelly to the skin on your feet and to dry, brittle toenails. Wear shoes that fit properly and have enough cushioning. If you have foot problems, report any cuts, sores, or bruises to your health care provider immediately. Schedule a complete foot exam at least once a year (annually) or more often if you have foot problems. This information is not intended to replace advice given to you by your health care provider. Make sure you discuss any questions you have with your health care provider. Document Revised: 05/15/2020 Document Reviewed: 05/15/2020 Elsevier Patient Education  2023 [employer].  

## 2022-04-01 DIAGNOSIS — E1165 Type 2 diabetes mellitus with hyperglycemia: Secondary | ICD-10-CM | POA: Diagnosis not present

## 2022-04-01 DIAGNOSIS — E08649 Diabetes mellitus due to underlying condition with hypoglycemia without coma: Secondary | ICD-10-CM | POA: Diagnosis not present

## 2022-04-01 DIAGNOSIS — E1122 Type 2 diabetes mellitus with diabetic chronic kidney disease: Secondary | ICD-10-CM | POA: Diagnosis not present

## 2022-04-02 DIAGNOSIS — E08649 Diabetes mellitus due to underlying condition with hypoglycemia without coma: Secondary | ICD-10-CM | POA: Diagnosis not present

## 2022-04-02 DIAGNOSIS — E1122 Type 2 diabetes mellitus with diabetic chronic kidney disease: Secondary | ICD-10-CM | POA: Diagnosis not present

## 2022-04-02 DIAGNOSIS — E1165 Type 2 diabetes mellitus with hyperglycemia: Secondary | ICD-10-CM | POA: Diagnosis not present

## 2022-04-07 ENCOUNTER — Encounter: Payer: Self-pay | Admitting: Family Medicine

## 2022-04-07 ENCOUNTER — Ambulatory Visit (INDEPENDENT_AMBULATORY_CARE_PROVIDER_SITE_OTHER): Payer: Medicare HMO | Admitting: Family Medicine

## 2022-04-07 VITALS — BP 133/77 | HR 80 | Resp 16 | Ht 68.0 in | Wt 212.8 lb

## 2022-04-07 DIAGNOSIS — F419 Anxiety disorder, unspecified: Secondary | ICD-10-CM

## 2022-04-07 DIAGNOSIS — E1165 Type 2 diabetes mellitus with hyperglycemia: Secondary | ICD-10-CM | POA: Diagnosis not present

## 2022-04-07 DIAGNOSIS — F32A Depression, unspecified: Secondary | ICD-10-CM

## 2022-04-07 DIAGNOSIS — E1140 Type 2 diabetes mellitus with diabetic neuropathy, unspecified: Secondary | ICD-10-CM

## 2022-04-07 DIAGNOSIS — E669 Obesity, unspecified: Secondary | ICD-10-CM

## 2022-04-07 DIAGNOSIS — Z794 Long term (current) use of insulin: Secondary | ICD-10-CM

## 2022-04-07 DIAGNOSIS — I10 Essential (primary) hypertension: Secondary | ICD-10-CM

## 2022-04-07 DIAGNOSIS — E782 Mixed hyperlipidemia: Secondary | ICD-10-CM

## 2022-04-07 NOTE — Patient Instructions (Addendum)
F/U mid August, call if you need me sooner  Most recent blood sugar avg of 133 looks good  I am referring you back for diabetic ed so you get more comfortable counting carbs ( nurse please refer)  Please review briefly, highlighting starches that he does eat, how to carb count   Will message Endo re your ask regarding medication   Thanks for choosing  Primary Care, we consider it a privelige to serve you.

## 2022-04-11 ENCOUNTER — Encounter: Payer: Self-pay | Admitting: Family Medicine

## 2022-04-11 NOTE — Assessment & Plan Note (Signed)
  Patient re-educated about  the importance of commitment to a  minimum of 150 minutes of exercise per week as able.  The importance of healthy food choices with portion control discussed, as well as eating regularly and within a 12 hour window most days. The need to choose "clean , [place]" food 50 to 75% of the time is discussed, as well as to make water the primary drink and set a goal of 64 ounces water daily.       04/07/2022    1:18 PM 03/25/2022    2:22 PM 03/09/2022    3:00 PM  Weight /BMI  Weight 212 lb 12.8 oz 212 lb 217 lb  Height '5\' 8"'$  (1.727 m) '5\' 8"'$  (1.727 m)   BMI 32.36 kg/m2 32.23 kg/m2 32.99 kg/m2

## 2022-04-11 NOTE — Assessment & Plan Note (Signed)
Hyperlipidemia:Low fat diet discussed and encouraged.   Lipid Panel  Lab Results  Component Value Date   CHOL 95 (L) 12/28/2021   HDL 30 (L) 12/28/2021   LDLCALC 29 12/28/2021   TRIG 233 (H) 12/28/2021   CHOLHDL 3.2 12/28/2021  needs to reduce fat intake

## 2022-04-11 NOTE — Assessment & Plan Note (Signed)
Controlled, no change in medication  

## 2022-04-11 NOTE — Assessment & Plan Note (Signed)
Managed by Endo, improving, encouraged  After visit that he is making improvement, and he is to keep Endo appt next week

## 2022-04-11 NOTE — Progress Notes (Signed)
   Norman Peters     MRN: [id number]      DOB: 05-Oct-1956   HPI Norman Peters is here for follow up and re-evaluation of chronic medical conditions, medication management and review of any available recent lab and radiology data.  Preventive health is updated, specifically  Cancer screening and Immunization.   Has upcoming  Endo appt, working on improving blood sugar as he has had to postpone needed surgery as a result of uncontrolled Diabetes  Review of meter suggests avg blood sugar in recent time is 133,so he is encouraged  ROS Denies recent fever or chills. Denies sinus pressure, nasal congestion, ear pain or sore throat. Denies chest congestion, productive cough or wheezing. Denies chest pains, palpitations and leg swelling Denies abdominal pain, nausea, vomiting,diarrhea or constipation.   c/o chronic Denies joint pain, swelling and limitation in mobility. Denies headaches, seizures, numbness, or tingling. Denies uncontrolled depression, anxiety or insomnia. Denies skin break down or rash.   PE  BP 133/77   Pulse 80   Resp 16   Ht '5\' 8"'$  (1.727 m)   Wt 212 lb 12.8 oz (96.5 kg)   SpO2 92%   BMI 32.36 kg/m   Patient alert and oriented and in no cardiopulmonary distress.  HEENT: No facial asymmetry, EOMI,     Neck supple .  Chest: Clear to auscultation bilaterally.  CVS: S1, S2 no murmurs, no S3.Regular rate.  ABD: Soft non tender.   Ext: No edema  MS: Adequate ROM spine, shoulders, hips and reduced in  knees.  Skin: Intact, no ulcerations or rash noted.  Psych: Good eye contact, normal affect. Memory intact not anxious or depressed appearing.  CNS: CN 2-12 intact, power,  normal throughout.no focal deficits noted.   Assessment & Plan  Essential hypertension, benign Controlled, no change in medication   Anxiety and depression Controlled, no change in medication   Type 2 diabetes mellitus with diabetic neuropathy, unspecified ([place]) Managed by Endo,  improving, encouraged  After visit that he is making improvement, and he is to keep Endo appt next week  Obesity (BMI 30.0-34.9)  Patient re-educated about  the importance of commitment to a  minimum of 150 minutes of exercise per week as able.  The importance of healthy food choices with portion control discussed, as well as eating regularly and within a 12 hour window most days. The need to choose "clean , [place]" food 50 to 75% of the time is discussed, as well as to make water the primary drink and set a goal of 64 ounces water daily.       04/07/2022    1:18 PM 03/25/2022    2:22 PM 03/09/2022    3:00 PM  Weight /BMI  Weight 212 lb 12.8 oz 212 lb 217 lb  Height '5\' 8"'$  (1.727 m) '5\' 8"'$  (1.727 m)   BMI 32.36 kg/m2 32.23 kg/m2 32.99 kg/m2      Mixed hyperlipidemia Hyperlipidemia:Low fat diet discussed and encouraged.   Lipid Panel  Lab Results  Component Value Date   CHOL 95 (L) 12/28/2021   HDL 30 (L) 12/28/2021   LDLCALC 29 12/28/2021   TRIG 233 (H) 12/28/2021   CHOLHDL 3.2 12/28/2021  needs to reduce fat intake

## 2022-04-14 ENCOUNTER — Ambulatory Visit (INDEPENDENT_AMBULATORY_CARE_PROVIDER_SITE_OTHER): Payer: Medicare HMO | Admitting: Nurse Practitioner

## 2022-04-14 ENCOUNTER — Ambulatory Visit (INDEPENDENT_AMBULATORY_CARE_PROVIDER_SITE_OTHER): Payer: Medicare HMO | Admitting: Family Medicine

## 2022-04-14 ENCOUNTER — Encounter: Payer: Self-pay | Admitting: Nurse Practitioner

## 2022-04-14 VITALS — BP 135/80 | Ht 68.0 in | Wt 214.0 lb

## 2022-04-14 VITALS — BP 135/80 | HR 84 | Ht 68.0 in | Wt 214.8 lb

## 2022-04-14 DIAGNOSIS — J302 Other seasonal allergic rhinitis: Secondary | ICD-10-CM

## 2022-04-14 DIAGNOSIS — E782 Mixed hyperlipidemia: Secondary | ICD-10-CM

## 2022-04-14 DIAGNOSIS — Z794 Long term (current) use of insulin: Secondary | ICD-10-CM

## 2022-04-14 DIAGNOSIS — E1165 Type 2 diabetes mellitus with hyperglycemia: Secondary | ICD-10-CM

## 2022-04-14 DIAGNOSIS — N1832 Chronic kidney disease, stage 3b: Secondary | ICD-10-CM | POA: Diagnosis not present

## 2022-04-14 DIAGNOSIS — I10 Essential (primary) hypertension: Secondary | ICD-10-CM

## 2022-04-14 DIAGNOSIS — E1122 Type 2 diabetes mellitus with diabetic chronic kidney disease: Secondary | ICD-10-CM | POA: Diagnosis not present

## 2022-04-14 MED ORDER — CHLORPHENIRAMINE MALEATE 4 MG PO TABS: ORAL_TABLET | ORAL | 0 refills | Status: DC

## 2022-04-14 MED ORDER — OZEMPIC (0.25 OR 0.5 MG/DOSE) 2 MG/3ML ~~LOC~~ SOPN: 0.5000 mg | PEN_INJECTOR | SUBCUTANEOUS | 3 refills | Status: DC

## 2022-04-14 NOTE — Progress Notes (Unsigned)
Virtual Visit via Telephone Note  I connected with [name] on 04/14/22 at  4:40 PM EDT by telephone and verified that I am speaking with the correct person using two identifiers.  Location: Patient: home Provider: [email or web address]   I discussed the limitations, risks, security and privacy concerns of performing an evaluation and management service by telephone and the availability of in person appointments. I also discussed with the patient that there may be a patient responsible charge related to this service. The patient expressed understanding and agreed to proceed.   History of Present Illness:    Observations/Objective:   Assessment and Plan:   Follow Up Instructions:    I discussed the assessment and treatment plan with the patient. The patient was provided an opportunity to ask questions and all were answered. The patient agreed with the plan and demonstrated an understanding of the instructions.   The patient was advised to call back or seek an in-person evaluation if the symptoms worsen or if the condition fails to improve as anticipated.  I provided 8 minutes of non-face-to-face time during this encounter.   Tula Nakayama, MD

## 2022-04-14 NOTE — Progress Notes (Signed)
Endocrinology follow-up note    04/14/2022, 3:24 PM   Subjective:    Patient ID: Norman Peters, male    DOB: 1956-08-24.  Norman Peters is being seen in follow-up after he was seen in consultation for management of currently uncontrolled symptomatic diabetes requested by  Fayrene Helper, MD.   Past Medical History:  Diagnosis Date   Acute left-sided low back pain with left-sided sciatica 11/04/2020   Anxiety    Arthritis    Depression 2001   Diabetes mellitus 2010   GERD (gastroesophageal reflux disease)    Hyperlipidemia    Hypertension 2005    Past Surgical History:  Procedure Laterality Date   COLONOSCOPY N/A 07/07/2016   Procedure: COLONOSCOPY;  Surgeon: Rogene Houston, MD;  Location: [facility];  Service: Endoscopy;  Laterality: N/A;  1030   FINGER SURGERY Left 1974   fifth , trauma on job, had to replace/repair finger   HERNIA REPAIR  1700   x3, umbilical and bilateral hernia    Social History   Socioeconomic History   Marital status: Married    Spouse name: Not on file   Number of children: Not on file   Years of education: Not on file   Highest education level: Not on file  Occupational History   Not on file  Tobacco Use   Smoking status: Former    Packs/day: 0.50    Years: 30.00    Pack years: 15.00    Types: Cigarettes    Quit date: 01/20/2011    Years since quitting: 11.2   Smokeless tobacco: Never  Vaping Use   Vaping Use: Never used  Substance and Sexual Activity   Alcohol use: No   Drug use: No   Sexual activity: Not Currently  Other Topics Concern   Not on file  Social History Narrative   Not on file   Social Determinants of Health   Financial Resource Strain: Not on file  Food Insecurity: Not on file  Transportation Needs: Not on file  Physical Activity: Not on file  Stress: Not on file  Social Connections: Not on file    Family History   Problem Relation Age of Onset   Stroke Mother     Outpatient Encounter Medications as of 04/14/2022  Medication Sig   Semaglutide,0.25 or 0.'5MG'$ /DOS, (OZEMPIC, 0.25 OR 0.5 MG/DOSE,) 2 MG/3ML SOPN Inject 0.5 mg into the skin once a week. Inject 0.25 mg SQ weekly x 2 doses then increase to 0.5 mg SQ weekly thereafter if tolerated well.   acetaminophen (TYLENOL) 325 MG tablet Take 650 mg by mouth every 6 (six) hours as needed.   albuterol (VENTOLIN HFA) 108 (90 Base) MCG/ACT inhaler Inhale 2 puffs into the lungs daily as needed for wheezing or shortness of breath.   amLODipine (NORVASC) 10 MG tablet TAKE 1 TABLET EVERY DAY   aspirin EC 81 MG tablet Take 81 mg by mouth every morning.   busPIRone (BUSPAR) 5 MG tablet Take 5 mg by mouth 2 (two) times daily.   diclofenac (VOLTAREN) 50 MG EC tablet Take 1 tablet by mouth twice daily  diclofenac Sodium (VOLTAREN) 1 % GEL Apply 4 g topically 2 (two) times daily.   fenofibrate (TRICOR) 48 MG tablet Take 1 tablet (48 mg total) by mouth daily.   FLUoxetine (PROZAC) 40 MG capsule Take 40 mg by mouth daily.   glucose blood test strip Use as instructed once daily testing dx e11.9   hydrochlorothiazide (HYDRODIURIL) 25 MG tablet Take 1 tablet (25 mg total) by mouth daily.   LANTUS SOLOSTAR 100 UNIT/ML Solostar Pen INJECT 45 UNITS UNDER THE SKIN EVERY MORNING (DOSE INCREASE) (Patient taking differently: Inject 55 Units into the skin every evening. 2330)   montelukast (SINGULAIR) 10 MG tablet Take 1 tablet by mouth once daily   olmesartan (BENICAR) 20 MG tablet Take 1 tablet (20 mg total) by mouth daily.   rosuvastatin (CRESTOR) 5 MG tablet TAKE 1 TABLET EVERY MONDAY, WEDNESDAY AND FRIDAY (DISCONTINUE PRAVASTATIN)   No facility-administered encounter medications on file as of 04/14/2022.    ALLERGIES: Allergies  Allergen Reactions   Ibuprofen Rash   Lisinopril Cough   Strawberry [Berry] Hives and Swelling    TONGUE SWELLING    VACCINATION  STATUS: Immunization History  Administered Date(s) Administered   Fluad Quad(high Dose 65+) 09/02/2020, 07/17/2021   Influenza,inj,Quad PF,6+ Mos 07/14/2016, 08/25/2017, 08/30/2018, 07/02/2019   PFIZER(Purple Top)SARS-COV-2 Vaccination 01/25/2020, 02/19/2020, 09/28/2020, 05/29/2021, 09/25/2021   PNEUMOCOCCAL CONJUGATE-20 07/17/2021   Pfizer Covid-19 Vaccine Bivalent Booster 72yr & up 09/25/2021   Pneumococcal Polysaccharide-23 02/10/2016   Tdap 07/04/2021   Zoster Recombinat (Shingrix) 07/08/2021, 09/09/2021    Diabetes He presents for his follow-up diabetic visit. He has type 2 diabetes mellitus. Onset time: He was diagnosed at approximate age of 89years. His disease course has been fluctuating. Pertinent negatives for hypoglycemia include no confusion, headaches, pallor or seizures. Associated symptoms include fatigue. Pertinent negatives for diabetes include no blurred vision, no chest pain, no polydipsia, no polyphagia, no polyuria, no weakness and no weight loss. Hypoglycemia complications include nocturnal hypoglycemia. Symptoms are improving. Diabetic complications include nephropathy and peripheral neuropathy. Risk factors for coronary artery disease include diabetes mellitus, dyslipidemia, obesity, hypertension, male sex, tobacco exposure and sedentary lifestyle. Current diabetic treatment includes insulin injections. He is compliant with treatment most of the time. His weight is fluctuating minimally. He is following a generally healthy diet. When asked about meal planning, he reported none. He has had a previous visit with a dietitian. He rarely participates in exercise. His home blood glucose trend is fluctuating minimally. His overall blood glucose range is >200 mg/dl. (He presents today with his CGM showing above target glycemic profile overall.  He was not due for another A1c today.  Analysis of his CGM shows TIR 39%, TAR 60%, TBR < 1%.  He does report some mildly low readings at times  but is frustrated that his glucose isn't responding more to the insulin.  He notes he sometimes skips meals as well.) An ACE inhibitor/angiotensin II receptor blocker is not being taken. He does not see a podiatrist.Eye exam is current.  Hyperlipidemia This is a chronic problem. The current episode started more than 1 year ago. The problem is uncontrolled. Recent lipid tests were reviewed and are variable. Exacerbating diseases include chronic renal disease, diabetes and obesity. Factors aggravating his hyperlipidemia include fatty foods. Pertinent negatives include no chest pain, myalgias or shortness of breath. Current antihyperlipidemic treatment includes statins. The current treatment provides mild improvement of lipids. Compliance problems include adherence to exercise and adherence to diet.  Risk factors for coronary artery  disease include diabetes mellitus, dyslipidemia, male sex, obesity, hypertension and a sedentary lifestyle.  Hypertension This is a chronic problem. The current episode started more than 1 year ago. The problem is unchanged. The problem is controlled. Pertinent negatives include no blurred vision, chest pain, headaches, neck pain, palpitations or shortness of breath. There are no associated agents to hypertension. Risk factors for coronary artery disease include diabetes mellitus, male gender, smoking/tobacco exposure, sedentary lifestyle, obesity and dyslipidemia. Past treatments include calcium channel blockers and diuretics. The current treatment provides mild improvement. There are no compliance problems.  Hypertensive end-organ damage includes kidney disease. Identifiable causes of hypertension include chronic renal disease.   Review of systems  Constitutional: + Minimally fluctuating body weight,  current Body mass index is 32.66 kg/m. , + fatigue, no subjective hyperthermia, no subjective hypothermia Eyes: no blurry vision, no xerophthalmia ENT: no sore throat, no nodules  palpated in throat, no dysphagia/odynophagia, no hoarseness Cardiovascular: no chest pain, no shortness of breath, no palpitations, no leg swelling Respiratory: no cough, no shortness of breath Gastrointestinal: no nausea/vomiting/diarrhea Musculoskeletal: generalized aches and pains- taking OTC Tylenol and seeing chiropractor, walks with cane Skin: no rashes, no hyperemia, has dark ring around his left foot Neurological: no tremors, + numbness/tingling to LLE, no dizziness Psychiatric: no depression, no anxiety   Objective:    BP 135/80   Pulse 84   Ht '5\' 8"'$  (1.727 m)   Wt 214 lb 12.8 oz (97.4 kg)   BMI 32.66 kg/m   Wt Readings from Last 3 Encounters:  04/14/22 214 lb 12.8 oz (97.4 kg)  04/07/22 212 lb 12.8 oz (96.5 kg)  03/25/22 212 lb (96.2 kg)    BP Readings from Last 3 Encounters:  04/14/22 135/80  04/07/22 133/77  03/25/22 135/80     Physical Exam- Limited  Constitutional:  Body mass index is 32.66 kg/m. , not in acute distress, normal state of mind Eyes:  EOMI, no exophthalmos Neck: Supple Cardiovascular: RRR, no murmurs, rubs, or gallops, no edema Respiratory: Adequate breathing efforts, no crackles, rales, rhonchi, or wheezing Musculoskeletal: no gross deformities, strength intact in all four extremities, no gross restriction of joint movements, walks with cane Skin:  no rashes, no hyperemia Neurological: no tremor with outstretched hands     CMP     Component Value Date/Time   NA 140 03/04/2022 1435   K 4.7 03/04/2022 1435   CL 101 03/04/2022 1435   CO2 25 03/04/2022 1435   GLUCOSE 274 (H) 03/04/2022 1435   GLUCOSE 320 (H) 10/12/2021 2205   BUN 25 03/04/2022 1435   CREATININE 1.83 (H) 03/04/2022 1435   CREATININE 1.47 (H) 08/25/2020 0927   CALCIUM 9.8 03/04/2022 1435   PROT 7.2 03/04/2022 1435   ALBUMIN 4.2 03/04/2022 1435   AST 9 03/04/2022 1435   ALT 12 03/04/2022 1435   ALKPHOS 83 03/04/2022 1435   BILITOT 0.3 03/04/2022 1435   GFRNONAA 49  (L) 10/12/2021 2205   GFRNONAA 50 (L) 08/25/2020 0927   GFRAA 61 12/10/2020 0933   GFRAA 58 (L) 08/25/2020 0927     Diabetic Labs (most recent): Lab Results  Component Value Date   HGBA1C 10.7 (A) 03/04/2022   HGBA1C 7.8 (A) 10/21/2021   HGBA1C 11.7 (A) 06/17/2021     Lipid Panel     Component Value Date/Time   CHOL 95 (L) 12/28/2021 1133   TRIG 233 (H) 12/28/2021 1133   HDL 30 (L) 12/28/2021 1133   CHOLHDL 3.2 12/28/2021 1133  CHOLHDL 3.4 08/25/2020 0927   VLDL 39 (H) 05/14/2017 0831   LDLCALC 29 12/28/2021 1133   LDLCALC 55 08/25/2020 0927   LABVLDL 36 12/28/2021 1133      Lab Results  Component Value Date   TSH 1.300 03/04/2022   TSH 2.215 07/22/2021   TSH 2.100 06/10/2021   TSH 2.79 08/25/2020   TSH 1.96 08/24/2019   TSH 1.34 01/31/2018   TSH 1.61 02/10/2016   FREET4 1.14 06/10/2021   FREET4 1.0 08/25/2020     Assessment & Plan:   1) Type 2 diabetes mellitus with stage 3a chronic kidney disease, with long-term current use of insulin ([place])  - Mcguire Gasparyan has currently uncontrolled symptomatic type 2 DM since 66 years of age.  He presents today with his CGM showing above target glycemic profile overall.  He was not due for another A1c today.  Analysis of his CGM shows TIR 39%, TAR 60%, TBR < 1%.  He does report some mildly low readings at times but is frustrated that his glucose isn't responding more to the insulin.  He notes he sometimes skips meals as well.   Recent labs reviewed.  - I had a long discussion with him about the progressive nature of diabetes and the pathology behind its complications. -his diabetes is complicated by CKD and he remains at a high risk for more acute and chronic complications which include CAD, CVA, CKD, retinopathy, and neuropathy. These are all discussed in detail with him.  - Nutritional counseling repeated at each appointment due to patients tendency to fall back in to old habits.  - The patient admits there is a room  for improvement in their diet and drink choices. -  Suggestion is made for the patient to avoid simple carbohydrates from their diet including Cakes, Sweet Desserts / Pastries, Ice Cream, Soda (diet and regular), Sweet Tea, Candies, Chips, Cookies, Sweet Pastries, Store Bought Juices, Alcohol in Excess of 1-2 drinks a day, Artificial Sweeteners, Coffee Creamer, and "Sugar-free" Products. This will help patient to have stable blood glucose profile and potentially avoid unintended weight gain.   - I encouraged the patient to switch to unprocessed or minimally processed complex starch and increased protein intake (animal or plant source), fruits, and vegetables.   - Patient is advised to stick to a routine mealtimes to eat 3 meals a day and avoid unnecessary snacks (to snack only to correct hypoglycemia).  - I have approached him with the following individualized plan to manage his diabetes and patient agrees:   - he will continue to benefit from basal insulin.    -He is advised to continue his Lantus 55 units SQ nightly and we discussed and initiated trial of Ozempic 0.25 mg SQ weekly x 2 then may increase to 0.5 mg SQ weekly thereafter if tolerated well.  -He is encouraged to continue monitoring blood glucose twice daily (using his CGM), before breakfast and before bed, and to call the clinic if he has readings less than 70 or greater than 200 for 3 tests in a row.  He is benefiting from this device.  - he is warned not to take insulin without proper monitoring per orders.  -He reports that he had developed unexplained cough while taking Glipizide and Metformin.  After he finished his Glipizide and Metformin, reportedly the cough has improved.  He is advised to stay off of Glipizide and Metformin for now.   - Specific targets for  A1c;  LDL, HDL,  and Triglycerides  were discussed with the patient.  2) Blood Pressure /Hypertension:  His blood pressure is controlled to target.  he is advised to  continue his current medications including Amlodipine 10 mg p.o. daily, Olmesartan 20 mg po daily with breakfast and HCTZ 12.5 mg po daily.  3) Lipids/Hyperlipidemia:   His most recent lipid panel from 12/28/21 shows controlled LDL of 29 and elevated triglycerides of 233 (worsening).  He is advised to continue Crestor 5 mg po every other day at bedtime and Fenofibrate 48 mg po daily.  Side effects and precautions discussed with him.   4)  Weight/Diet:  His Body mass index is 32.66 kg/m.  -   clearly complicating his diabetes care.   he is a candidate for weight loss. I discussed with him the fact that loss of 5 - 10% of his  current body weight will have the most impact on his diabetes management.  Exercise, and detailed carbohydrates information provided  -  detailed on discharge instructions.  5) Chronic Care/Health Maintenance: -he is on a Statin medications and is encouraged to initiate and continue to follow up with Ophthalmology, Dentist, Podiatrist at least yearly or according to recommendations, and advised to stay away from smoking. I have recommended yearly flu vaccine and pneumonia vaccine at least every 5 years; moderate intensity exercise for up to 150 minutes weekly; and  sleep for at least 7 hours a day.  - he is advised to maintain close follow up with Fayrene Helper, MD for primary care needs, as well as his other providers for optimal and coordinated care.     I spent 33 minutes in the care of the patient today including review of labs from [place], Lipids, Thyroid Function, Hematology (current and previous including abstractions from other facilities); face-to-face time discussing  his blood glucose readings/logs, discussing hypoglycemia and hyperglycemia episodes and symptoms, medications doses, his options of short and long term treatment based on the latest standards of care / guidelines;  discussion about incorporating lifestyle medicine;  and documenting the encounter.     Please refer to Patient Instructions for Blood Glucose Monitoring and Insulin/Medications Dosing Guide"  in media tab for additional information. Please  also refer to " Patient Self Inventory" in the Media  tab for reviewed elements of pertinent patient history.  [name] participated in the discussions, expressed understanding, and voiced agreement with the above plans.  All questions were answered to his satisfaction. he is encouraged to contact clinic should he have any questions or concerns prior to his return visit.   Follow up plan: - Return in about 3 months (around 07/15/2022) for Diabetes F/U with A1c in office, No previsit labs, Bring meter and logs.  Rayetta Pigg, [facility] [address] [place], [place] [address] Phone: [phone] Fax: [phone]  04/14/2022, 3:24 PM

## 2022-04-14 NOTE — Patient Instructions (Signed)
F/U as before, call if you need me sooner  Chlorpheniramine one daily for 5 days , then as needed for uncontrolled allergy symptoms  Ensure you drink 64 ounces water daily  Thanks for choosing [facility], we consider it a privelige to serve you.

## 2022-04-14 NOTE — Patient Instructions (Signed)
Diabetes Mellitus and Foot Care Foot care is an important part of your health, especially when you have diabetes. Diabetes may cause you to have problems because of poor blood flow (circulation) to your feet and legs, which can cause your skin to: Become thinner and drier. Break more easily. Heal more slowly. Peel and crack. You may also have nerve damage (neuropathy) in your legs and feet, causing decreased feeling in them. This means that you may not notice minor injuries to your feet that could lead to more serious problems. Noticing and addressing any potential problems early is the best way to prevent future foot problems. How to care for your feet Foot hygiene  Wash your feet daily with warm water and mild soap. Do not use hot water. Then, pat your feet and the areas between your toes until they are completely dry. Do not soak your feet as this can dry your skin. Trim your toenails straight across. Do not dig under them or around the cuticle. File the edges of your nails with an emery board or nail file. Apply a moisturizing lotion or petroleum jelly to the skin on your feet and to dry, brittle toenails. Use lotion that does not contain alcohol and is unscented. Do not apply lotion between your toes. Shoes and socks Wear clean socks or stockings every day. Make sure they are not too tight. Do not wear knee-high stockings since they may decrease blood flow to your legs. Wear shoes that fit properly and have enough cushioning. Always look in your shoes before you put them on to be sure there are no objects inside. To break in new shoes, wear them for just a few hours a day. This prevents injuries on your feet. Wounds, scrapes, corns, and calluses  Check your feet daily for blisters, cuts, bruises, sores, and redness. If you cannot see the bottom of your feet, use a mirror or ask someone for help. Do not cut corns or calluses or try to remove them with medicine. If you find a minor scrape,  cut, or break in the skin on your feet, keep it and the skin around it clean and dry. You may clean these areas with mild soap and water. Do not clean the area with peroxide, alcohol, or iodine. If you have a wound, scrape, corn, or callus on your foot, look at it several times a day to make sure it is healing and not infected. Check for: Redness, swelling, or pain. Fluid or blood. Warmth. Pus or a bad smell. General tips Do not cross your legs. This may decrease blood flow to your feet. Do not use heating pads or hot water bottles on your feet. They may burn your skin. If you have lost feeling in your feet or legs, you may not know this is happening until it is too late. Protect your feet from hot and cold by wearing shoes, such as at the beach or on hot pavement. Schedule a complete foot exam at least once a year (annually) or more often if you have foot problems. Report any cuts, sores, or bruises to your health care provider immediately. Where to find more information American Diabetes Association: www.diabetes.org Association of Diabetes Care & Education Specialists: www.diabeteseducator.org Contact a health care provider if: You have a medical condition that increases your risk of infection and you have any cuts, sores, or bruises on your feet. You have an injury that is not healing. You have redness on your legs or feet. You   feel burning or tingling in your legs or feet. You have pain or cramps in your legs and feet. Your legs or feet are numb. Your feet always feel cold. You have pain around any toenails. Get help right away if: You have a wound, scrape, corn, or callus on your foot and: You have pain, swelling, or redness that gets worse. You have fluid or blood coming from the wound, scrape, corn, or callus. Your wound, scrape, corn, or callus feels warm to the touch. You have pus or a bad smell coming from the wound, scrape, corn, or callus. You have a fever. You have a red  line going up your leg. Summary Check your feet every day for blisters, cuts, bruises, sores, and redness. Apply a moisturizing lotion or petroleum jelly to the skin on your feet and to dry, brittle toenails. Wear shoes that fit properly and have enough cushioning. If you have foot problems, report any cuts, sores, or bruises to your health care provider immediately. Schedule a complete foot exam at least once a year (annually) or more often if you have foot problems. This information is not intended to replace advice given to you by your health care provider. Make sure you discuss any questions you have with your health care provider. Document Revised: 05/15/2020 Document Reviewed: 05/15/2020 Elsevier Patient Education  2023 [employer].  

## 2022-04-15 ENCOUNTER — Ambulatory Visit: Payer: Medicare HMO | Admitting: Nurse Practitioner

## 2022-04-19 NOTE — Progress Notes (Signed)
This encounter was created in error - please disregard.

## 2022-04-22 ENCOUNTER — Ambulatory Visit (INDEPENDENT_AMBULATORY_CARE_PROVIDER_SITE_OTHER): Payer: Medicare HMO | Admitting: *Deleted

## 2022-04-22 DIAGNOSIS — Z0000 Encounter for general adult medical examination without abnormal findings: Secondary | ICD-10-CM | POA: Diagnosis not present

## 2022-04-22 NOTE — Patient Instructions (Signed)
[name] , Thank you for taking time to come for your Medicare Wellness Visit. I appreciate your ongoing commitment to your health goals. Please review the following plan we discussed and let me know if I can assist you in the future.   Screening recommendations/referrals: Colonoscopy: completed Recommended yearly ophthalmology/optometry visit for glaucoma screening and checkup Recommended yearly dental visit for hygiene and checkup  Vaccinations: Influenza vaccine: completed Pneumococcal vaccine: completed Tdap vaccine: completed Shingles vaccine: completed    Advanced directives: has information already  Conditions/risks identified: hypertension, diabetes  Next appointment: 1 year  Preventive Care 38 Years and Older, Male Preventive care refers to lifestyle choices and visits with your health care provider that can promote health and wellness. What does preventive care include? A yearly physical exam. This is also called an annual well check. Dental exams once or twice a year. Routine eye exams. Ask your health care provider how often you should have your eyes checked. Personal lifestyle choices, including: Daily care of your teeth and gums. Regular physical activity. Eating a healthy diet. Avoiding tobacco and drug use. Limiting alcohol use. Practicing safe sex. Taking low doses of aspirin every day. Taking vitamin and mineral supplements as recommended by your health care provider. What happens during an annual well check? The services and screenings done by your health care provider during your annual well check will depend on your age, overall health, lifestyle risk factors, and family history of disease. Counseling  Your health care provider may ask you questions about your: Alcohol use. Tobacco use. Drug use. Emotional well-being. Home and relationship well-being. Sexual activity. Eating habits. History of falls. Memory and ability to understand  (cognition). Work and work Statistician. Screening  You may have the following tests or measurements: Height, weight, and BMI. Blood pressure. Lipid and cholesterol levels. These may be checked every 5 years, or more frequently if you are over 35 years old. Skin check. Lung cancer screening. You may have this screening every year starting at age 52 if you have a 30-pack-year history of smoking and currently smoke or have quit within the past 15 years. Fecal occult blood test (FOBT) of the stool. You may have this test every year starting at age 67. Flexible sigmoidoscopy or colonoscopy. You may have a sigmoidoscopy every 5 years or a colonoscopy every 10 years starting at age 9. Prostate cancer screening. Recommendations will vary depending on your family history and other risks. Hepatitis C blood test. Hepatitis B blood test. Sexually transmitted disease (STD) testing. Diabetes screening. This is done by checking your blood sugar (glucose) after you have not eaten for a while (fasting). You may have this done every 1-3 years. Abdominal aortic aneurysm (AAA) screening. You may need this if you are a current or former smoker. Osteoporosis. You may be screened starting at age 20 if you are at high risk. Talk with your health care provider about your test results, treatment options, and if necessary, the need for more tests. Vaccines  Your health care provider may recommend certain vaccines, such as: Influenza vaccine. This is recommended every year. Tetanus, diphtheria, and acellular pertussis (Tdap, Td) vaccine. You may need a Td booster every 10 years. Zoster vaccine. You may need this after age 39. Pneumococcal 13-valent conjugate (PCV13) vaccine. One dose is recommended after age 7. Pneumococcal polysaccharide (PPSV23) vaccine. One dose is recommended after age 37. Talk to your health care provider about which screenings and vaccines you need and how often you need them. This  information is not intended to replace advice given to you by your health care provider. Make sure you discuss any questions you have with your health care provider. Document Released: 11/21/2015 Document Revised: 07/14/2016 Document Reviewed: 08/26/2015 Elsevier Interactive Patient Education  2017 [employer] Prevention in the Home Falls can cause injuries. They can happen to people of all ages. There are many things you can do to make your home safe and to help prevent falls. What can I do on the outside of my home? Regularly fix the edges of walkways and driveways and fix any cracks. Remove anything that might make you trip as you walk through a door, such as a raised step or threshold. Trim any bushes or trees on the path to your home. Use bright outdoor lighting. Clear any walking paths of anything that might make someone trip, such as rocks or tools. Regularly check to see if handrails are loose or broken. Make sure that both sides of any steps have handrails. Any raised decks and porches should have guardrails on the edges. Have any leaves, snow, or ice cleared regularly. Use sand or salt on walking paths during winter. Clean up any spills in your garage right away. This includes oil or grease spills. What can I do in the bathroom? Use night lights. Install grab bars by the toilet and in the tub and shower. Do not use towel bars as grab bars. Use non-skid mats or decals in the tub or shower. If you need to sit down in the shower, use a plastic, non-slip stool. Keep the floor dry. Clean up any water that spills on the floor as soon as it happens. Remove soap buildup in the tub or shower regularly. Attach bath mats securely with double-sided non-slip rug tape. Do not have throw rugs and other things on the floor that can make you trip. What can I do in the bedroom? Use night lights. Make sure that you have a light by your bed that is easy to reach. Do not use any sheets or  blankets that are too big for your bed. They should not hang down onto the floor. Have a firm chair that has side arms. You can use this for support while you get dressed. Do not have throw rugs and other things on the floor that can make you trip. What can I do in the kitchen? Clean up any spills right away. Avoid walking on wet floors. Keep items that you use a lot in easy-to-reach places. If you need to reach something above you, use a strong step stool that has a grab bar. Keep electrical cords out of the way. Do not use floor polish or wax that makes floors slippery. If you must use wax, use non-skid floor wax. Do not have throw rugs and other things on the floor that can make you trip. What can I do with my stairs? Do not leave any items on the stairs. Make sure that there are handrails on both sides of the stairs and use them. Fix handrails that are broken or loose. Make sure that handrails are as long as the stairways. Check any carpeting to make sure that it is firmly attached to the stairs. Fix any carpet that is loose or worn. Avoid having throw rugs at the top or bottom of the stairs. If you do have throw rugs, attach them to the floor with carpet tape. Make sure that you have a light switch at the top of  the stairs and the bottom of the stairs. If you do not have them, ask someone to add them for you. What else can I do to help prevent falls? Wear shoes that: Do not have high heels. Have rubber bottoms. Are comfortable and fit you well. Are closed at the toe. Do not wear sandals. If you use a stepladder: Make sure that it is fully opened. Do not climb a closed stepladder. Make sure that both sides of the stepladder are locked into place. Ask someone to hold it for you, if possible. Clearly mark and make sure that you can see: Any grab bars or handrails. First and last steps. Where the edge of each step is. Use tools that help you move around (mobility aids) if they are  needed. These include: Canes. Walkers. Scooters. Crutches. Turn on the lights when you go into a dark area. Replace any light bulbs as soon as they burn out. Set up your furniture so you have a clear path. Avoid moving your furniture around. If any of your floors are uneven, fix them. If there are any pets around you, be aware of where they are. Review your medicines with your doctor. Some medicines can make you feel dizzy. This can increase your chance of falling. Ask your doctor what other things that you can do to help prevent falls. This information is not intended to replace advice given to you by your health care provider. Make sure you discuss any questions you have with your health care provider. Document Released: 08/21/2009 Document Revised: 04/01/2016 Document Reviewed: 11/29/2014 Elsevier Interactive Patient Education  2017 [employer].

## 2022-04-22 NOTE — Progress Notes (Signed)
Subjective:   Norman Peters is a 66 y.o. male who presents for Medicare Annual/Subsequent preventive examination.  I connected with  Norman Peters on 04/22/22 by a audio enabled telemedicine application and verified that I am speaking with the correct person using two identifiers.  Patient Location: Home  Provider Location: Office/Clinic  I discussed the limitations of evaluation and management by telemedicine. The patient expressed understanding and agreed to proceed.   Review of Systems     Norman Peters , Thank you for taking time to come for your Medicare Wellness Visit. I appreciate your ongoing commitment to your health goals. Please review the following plan we discussed and let me know if I can assist you in the future.   These are the goals we discussed:  Goals      Increase physical activity     Try walking 3 days a week for 10-15 mins as tolerated      LIFESTYLE - DECREASE FALLS RISK        This is a list of the screening recommended for you and due dates:  Health Maintenance  Topic Date Due   COVID-19 Vaccine (6 - Pfizer series) 01/23/2022   Flu Shot  06/08/2022   Eye exam for diabetics  07/01/2022   Hemoglobin A1C  09/03/2022   Complete foot exam   01/16/2023   Colon Cancer Screening  07/07/2026   Tetanus Vaccine  07/05/2031   Pneumonia Vaccine  Completed   Hepatitis C Screening: USPSTF Recommendation to screen - Ages 18-79 yo.  Completed   Zoster (Shingles) Vaccine  Completed   HPV Vaccine  Aged Out          Objective:    There were no vitals filed for this visit. There is no height or weight on file to calculate BMI.     02/15/2022    2:30 PM 01/13/2022    9:19 AM 12/17/2021   10:29 AM 11/16/2021   11:23 AM 10/15/2021    1:41 PM 09/17/2021   10:14 AM 08/20/2021   10:18 AM  Advanced Directives  Does Patient Have a Medical Advance Directive? No No No No No No No  Would patient like information on creating a medical advance directive? No - Patient  declined No - Patient declined No - Patient declined No - Patient declined No - Patient declined No - Patient declined No - Patient declined    Current Medications (verified) Outpatient Encounter Medications as of 04/22/2022  Medication Sig   acetaminophen (TYLENOL) 325 MG tablet Take 650 mg by mouth every 6 (six) hours as needed.   albuterol (VENTOLIN HFA) 108 (90 Base) MCG/ACT inhaler Inhale 2 puffs into the lungs daily as needed for wheezing or shortness of breath.   amLODipine (NORVASC) 10 MG tablet TAKE 1 TABLET EVERY DAY   aspirin EC 81 MG tablet Take 81 mg by mouth every morning.   busPIRone (BUSPAR) 5 MG tablet Take 5 mg by mouth 2 (two) times daily.   chlorpheniramine (CHLOR-TRIMETON) 4 MG tablet Take one tablet by mouth once daily for days , then as needed, fir sinus congestion   diclofenac (VOLTAREN) 50 MG EC tablet Take 1 tablet by mouth twice daily   diclofenac Sodium (VOLTAREN) 1 % GEL Apply 4 g topically 2 (two) times daily.   fenofibrate (TRICOR) 48 MG tablet Take 1 tablet (48 mg total) by mouth daily.   FLUoxetine (PROZAC) 40 MG capsule Take 40 mg by mouth daily.   glucose blood test  strip Use as instructed once daily testing dx e11.9   hydrochlorothiazide (HYDRODIURIL) 25 MG tablet Take 1 tablet (25 mg total) by mouth daily.   LANTUS SOLOSTAR 100 UNIT/ML Solostar Pen INJECT 45 UNITS UNDER THE SKIN EVERY MORNING (DOSE INCREASE) (Patient taking differently: Inject 55 Units into the skin every evening. 2330)   montelukast (SINGULAIR) 10 MG tablet Take 1 tablet by mouth once daily   olmesartan (BENICAR) 20 MG tablet Take 1 tablet (20 mg total) by mouth daily.   rosuvastatin (CRESTOR) 5 MG tablet TAKE 1 TABLET EVERY MONDAY, WEDNESDAY AND FRIDAY (DISCONTINUE PRAVASTATIN)   Semaglutide,0.25 or 0.'5MG'$ /DOS, (OZEMPIC, 0.25 OR 0.5 MG/DOSE,) 2 MG/3ML SOPN Inject 0.5 mg into the skin once a week. Inject 0.25 mg SQ weekly x 2 doses then increase to 0.5 mg SQ weekly thereafter if tolerated  well.   No facility-administered encounter medications on file as of 04/22/2022.    Allergies (verified) Ibuprofen, Lisinopril, and Strawberry [berry]   History: Past Medical History:  Diagnosis Date   Acute left-sided low back pain with left-sided sciatica 11/04/2020   Anxiety    Arthritis    Depression 2001   Diabetes mellitus 2010   GERD (gastroesophageal reflux disease)    Hyperlipidemia    Hypertension 2005   Past Surgical History:  Procedure Laterality Date   COLONOSCOPY N/A 07/07/2016   Procedure: COLONOSCOPY;  Surgeon: Rogene Houston, MD;  Location: [facility];  Service: Endoscopy;  Laterality: N/A;  1030   FINGER SURGERY Left 1974   fifth , trauma on job, had to replace/repair finger   HERNIA REPAIR  7846   x3, umbilical and bilateral hernia   Family History  Problem Relation Age of Onset   Stroke Mother    Social History   Socioeconomic History   Marital status: Married    Spouse name: Not on file   Number of children: Not on file   Years of education: Not on file   Highest education level: Not on file  Occupational History   Not on file  Tobacco Use   Smoking status: Former    Packs/day: 0.50    Years: 30.00    Total pack years: 15.00    Types: Cigarettes    Quit date: 01/20/2011    Years since quitting: 11.2   Smokeless tobacco: Never  Vaping Use   Vaping Use: Never used  Substance and Sexual Activity   Alcohol use: No   Drug use: No   Sexual activity: Not Currently  Other Topics Concern   Not on file  Social History Narrative   Not on file   Social Determinants of Health   Financial Resource Strain: Low Risk  (04/03/2021)   Overall Financial Resource Strain (CARDIA)    Difficulty of Paying Living Expenses: Not hard at all  Food Insecurity: No Food Insecurity (04/03/2021)   Hunger Vital Sign    Worried About Running Out of Food in the Last Year: Never true    [place] in the Last Year: Never true  Transportation Needs: No  Transportation Needs (04/03/2021)   PRAPARE - Hydrologist (Medical): No    Lack of Transportation (Non-Medical): No  Physical Activity: Insufficiently Active (04/03/2021)   Exercise Vital Sign    Days of Exercise per Week: 5 days    Minutes of Exercise per Session: 20 min  Stress: No Stress Concern Present (04/03/2021)   [place]  Feeling of Stress : Not at all  Social Connections: Socially Integrated (04/03/2021)   Social Connection and Isolation Panel [NHANES]    Frequency of Communication with Friends and Family: More than three times a week    Frequency of Social Gatherings with Friends and Family: Once a week    Attends Religious Services: More than 4 times per year    Active Member of [employer] or Organizations: Yes    Attends Music therapist: More than 4 times per year    Marital Status: Married    Tobacco Counseling Counseling given: Not Answered   Clinical Intake:                 Diabetic?Nutrition Risk Assessment:  Has the patient had any N/V/D within the last 2 months?  No  Does the patient have any non-healing wounds?  No  Has the patient had any unintentional weight loss or weight gain?  No   Diabetes:  Is the patient diabetic?  Yes  If diabetic, was a CBG obtained today?  No  Did the patient bring in their glucometer from home?  No  How often do you monitor your CBG's? Twice daily.   Financial Strains and Diabetes Management:  Are you having any financial strains with the device, your supplies or your medication? No .  Does the patient want to be seen by Chronic Care Management for management of their diabetes?  No  Would the patient like to be referred to a Nutritionist or for Diabetic Management?  No   Diabetic Exams:  Diabetic Eye Exam: Completed 07-01-21.   Diabetic Foot Exam: Completed 01-15-22.           Activities of Daily  Living    07/10/2021    9:15 PM 07/10/2021    9:13 PM  In your present state of health, do you have any difficulty performing the following activities:  Hearing?  0  Vision?  0  Difficulty concentrating or making decisions?  0  Walking or climbing stairs?  0  Dressing or bathing?  0  Doing errands, shopping? 0     Patient Care Team: Fayrene Helper, MD as PCP - General (Family Medicine) Satira Sark, MD as PCP - Cardiology (Cardiology) Derek Jack, MD as Medical Oncologist (Hematology)  Indicate any recent Medical Services you may have received from other than [facility] providers in the past year (date may be approximate).     Assessment:   This is a routine wellness examination for Jaleil.  Hearing/Vision screen No results found.  Dietary issues and exercise activities discussed:     Goals Addressed   None   Depression Screen    10/29/2021    1:23 PM 10/14/2021   10:49 AM 08/19/2021    9:31 AM 07/06/2021   10:01 AM 04/03/2021   10:28 AM 12/11/2020    2:33 PM 11/04/2020    3:06 PM  PHQ 2/9 Scores  PHQ - 2 Score 0 0 3 0 1 0 1  PHQ- 9 Score   8    4    Fall Risk    04/07/2022    1:19 PM 03/04/2022    1:15 PM 10/29/2021    1:23 PM 10/14/2021   10:49 AM 08/19/2021    9:31 AM  [place] in the past year? 0 0 0 0 0  Number falls in past yr: 0 0 0 0   Injury with Fall? 0 0 0 0  FALL RISK PREVENTION PERTAINING TO THE HOME:  Any stairs in or around the home? Yes  If so, are there any without handrails? Yes  Home free of loose throw rugs in walkways, pet beds, electrical cords, etc? Yes  Adequate lighting in your home to reduce risk of falls? Yes   ASSISTIVE DEVICES UTILIZED TO PREVENT FALLS:  Life alert? No  Use of a cane, walker or w/c? Yes  Grab bars in the bathroom? Yes  Shower chair or bench in shower? No  Elevated toilet seat or a handicapped toilet? Yes   Cognitive Function:        04/03/2021   10:31 AM 04/01/2020    8:50 AM  03/28/2019    9:45 AM  6CIT Screen  What Year? 0 points 0 points 0 points  What month? 0 points 0 points 0 points  What time? 0 points 0 points 0 points  Count back from 20 0 points 0 points 0 points  Months in reverse 0 points 4 points 0 points  Repeat phrase 0 points 0 points 0 points  Total Score 0 points 4 points 0 points    Immunizations Immunization History  Administered Date(s) Administered   Fluad Quad(high Dose 65+) 09/02/2020, 07/17/2021   Influenza,inj,Quad PF,6+ Mos 07/14/2016, 08/25/2017, 08/30/2018, 07/02/2019   PFIZER(Purple Top)SARS-COV-2 Vaccination 01/25/2020, 02/19/2020, 09/28/2020, 05/29/2021, 09/25/2021   PNEUMOCOCCAL CONJUGATE-20 07/17/2021   Pfizer Covid-19 Vaccine Bivalent Booster 36yr & up 09/25/2021   Pneumococcal Polysaccharide-23 02/10/2016   Tdap 07/04/2021   Zoster Recombinat (Shingrix) 07/08/2021, 09/09/2021    TDAP status: Up to date  Flu Vaccine status: Up to date  Pneumococcal vaccine status: Up to date  Covid-19 vaccine status: Completed vaccines  Qualifies for Shingles Vaccine? Yes   Zostavax completed Yes   Shingrix Completed?: Yes  Screening Tests Health Maintenance  Topic Date Due   COVID-19 Vaccine (6 - Pfizer series) 01/23/2022   INFLUENZA VACCINE  06/08/2022   OPHTHALMOLOGY EXAM  07/01/2022   HEMOGLOBIN A1C  09/03/2022   FOOT EXAM  01/16/2023   COLONOSCOPY (Pts 45-464yrInsurance coverage will need to be confirmed)  07/07/2026   TETANUS/TDAP  07/05/2031   Pneumonia Vaccine 6532Years old  Completed   Hepatitis C Screening  Completed   Zoster Vaccines- Shingrix  Completed   HPV VACCINES  Aged Out    Health Maintenance  Health Maintenance Due  Topic Date Due   COVID-19 Vaccine (6 - Pfizer series) 01/23/2022    Colorectal cancer screening: Type of screening: Colonoscopy. Completed .10. Repeat every 10 years  Lung Cancer Screening: (Low Dose CT Chest recommended if Age 66-80ears, 30 pack-year currently smoking OR  have quit w/in 15years.) does not qualify.   Lung Cancer Screening Referral: NA  Additional Screening:  Hepatitis C Screening: does qualify; Completed 02-10-16  Vision Screening: Recommended annual ophthalmology exams for early detection of glaucoma and other disorders of the eye. Is the patient up to date with their annual eye exam?  Yes  Who is the provider or what is the name of the office in which the patient attends annual eye exams? [facility] pt is not established with a provider, would they like to be referred to a provider to establish care? No .   Dental Screening: Recommended annual dental exams for proper oral hygiene  Community Resource Referral / Chronic Care Management: CRR required this visit?  No   CCM required this visit?  No      Plan:  I have personally reviewed and noted the following in the patient's chart:   Medical and social history Use of alcohol, tobacco or illicit drugs  Current medications and supplements including opioid prescriptions. Patient is not currently taking opioid prescriptions. Functional ability and status Nutritional status Physical activity Advanced directives List of other physicians Hospitalizations, surgeries, and ER visits in previous 12 months Vitals Screenings to include cognitive, depression, and falls Referrals and appointments  In addition, I have reviewed and discussed with patient certain preventive protocols, quality metrics, and best practice recommendations. A written personalized care plan for preventive services as well as general preventive health recommendations were provided to patient.     Shelda Altes, CMA   04/22/2022   Nurse Notes:  Mr. Kunin , Thank you for taking time to come for your Medicare Wellness Visit. I appreciate your ongoing commitment to your health goals. Please review the following plan we discussed and let me know if I can assist you in the future.   These are the goals  we discussed:  Goals      Increase physical activity     Try walking 3 days a week for 10-15 mins as tolerated      LIFESTYLE - DECREASE FALLS RISK        This is a list of the screening recommended for you and due dates:  Health Maintenance  Topic Date Due   COVID-19 Vaccine (6 - Pfizer series) 01/23/2022   Flu Shot  06/08/2022   Eye exam for diabetics  07/01/2022   Hemoglobin A1C  09/03/2022   Complete foot exam   01/16/2023   Colon Cancer Screening  07/07/2026   Tetanus Vaccine  07/05/2031   Pneumonia Vaccine  Completed   Hepatitis C Screening: USPSTF Recommendation to screen - Ages 18-79 yo.  Completed   Zoster (Shingles) Vaccine  Completed   HPV Vaccine  Aged Out

## 2022-04-28 ENCOUNTER — Other Ambulatory Visit: Payer: Self-pay | Admitting: Family Medicine

## 2022-05-02 ENCOUNTER — Encounter: Payer: Self-pay | Admitting: Family Medicine

## 2022-05-02 DIAGNOSIS — E1165 Type 2 diabetes mellitus with hyperglycemia: Secondary | ICD-10-CM | POA: Diagnosis not present

## 2022-05-02 NOTE — Assessment & Plan Note (Signed)
Increased and uncontrolled with upper respiratory symptoms. Add daily chlorpheniramine short term and continue daily singulair, call back if symptoms persist or worsen

## 2022-05-06 ENCOUNTER — Encounter (HOSPITAL_COMMUNITY): Payer: Self-pay

## 2022-05-06 ENCOUNTER — Other Ambulatory Visit: Payer: Self-pay

## 2022-05-06 ENCOUNTER — Emergency Department (HOSPITAL_COMMUNITY)
Admission: EM | Admit: 2022-05-06 | Discharge: 2022-05-06 | Disposition: A | Discharge status: Home / Self Care | Payer: Medicare HMO | Attending: Emergency Medicine | Admitting: Emergency Medicine

## 2022-05-06 DIAGNOSIS — E162 Hypoglycemia, unspecified: Secondary | ICD-10-CM | POA: Diagnosis present

## 2022-05-06 DIAGNOSIS — Z7982 Long term (current) use of aspirin: Secondary | ICD-10-CM | POA: Insufficient documentation

## 2022-05-06 DIAGNOSIS — I10 Essential (primary) hypertension: Secondary | ICD-10-CM | POA: Insufficient documentation

## 2022-05-06 DIAGNOSIS — Z79899 Other long term (current) drug therapy: Secondary | ICD-10-CM | POA: Diagnosis not present

## 2022-05-06 DIAGNOSIS — E11649 Type 2 diabetes mellitus with hypoglycemia without coma: Secondary | ICD-10-CM | POA: Insufficient documentation

## 2022-05-06 LAB — BASIC METABOLIC PANEL
Anion gap: 10 (ref 5–15)
BUN: 29 mg/dL — ABNORMAL HIGH (ref 8–23)
CO2: 25 mmol/L (ref 22–32)
Calcium: 9.3 mg/dL (ref 8.9–10.3)
Chloride: 104 mmol/L (ref 98–111)
Creatinine, Ser: 1.92 mg/dL — ABNORMAL HIGH (ref 0.61–1.24)
GFR, Estimated: 38 mL/min — ABNORMAL LOW (ref >60)
Glucose, Bld: 87 mg/dL (ref 70–99)
Potassium: 3.4 mmol/L — ABNORMAL LOW (ref 3.5–5.1)
Sodium: 139 mmol/L (ref 135–145)

## 2022-05-06 LAB — CBC WITH DIFFERENTIAL/PLATELET
Abs Immature Granulocytes: 0.04 10*3/uL (ref 0.00–0.07)
Basophils Absolute: 0.1 10*3/uL (ref 0.0–0.1)
Basophils Relative: 1 %
Eosinophils Absolute: 0.4 10*3/uL (ref 0.0–0.5)
Eosinophils Relative: 3 %
HCT: 42.8 % (ref 39.0–52.0)
Hemoglobin: 14.3 g/dL (ref 13.0–17.0)
Immature Granulocytes: 0 %
Lymphocytes Relative: 43 %
Lymphs Abs: 5.8 10*3/uL — ABNORMAL HIGH (ref 0.7–4.0)
MCH: 29.8 pg (ref 26.0–34.0)
MCHC: 33.4 g/dL (ref 30.0–36.0)
MCV: 89.2 fL (ref 80.0–100.0)
Monocytes Absolute: 1.2 10*3/uL — ABNORMAL HIGH (ref 0.1–1.0)
Monocytes Relative: 9 %
Neutro Abs: 5.8 10*3/uL (ref 1.7–7.7)
Neutrophils Relative %: 44 %
Platelets: 295 10*3/uL (ref 150–400)
RBC: 4.8 MIL/uL (ref 4.22–5.81)
RDW: 12.8 % (ref 11.5–15.5)
WBC: 13.3 10*3/uL — ABNORMAL HIGH (ref 4.0–10.5)
nRBC: 0 % (ref 0.0–0.2)

## 2022-05-06 LAB — CBG MONITORING, ED
Glucose-Capillary: 150 mg/dL — ABNORMAL HIGH (ref 70–99)
Glucose-Capillary: 69 mg/dL — ABNORMAL LOW (ref 70–99)

## 2022-05-06 NOTE — Discharge Instructions (Signed)
Continue medications as previously prescribed.  Return to the emergency department if you develop any new and/or worsening symptoms.

## 2022-05-06 NOTE — ED Triage Notes (Addendum)
Pov from home. Cc of low blood sugar. Says it was 70 at home. Ate some peanutbutter. Then it was 51. CBG 69 in triage.   Took 55u of Lantus at 2330. Says hes on no other insulins

## 2022-05-06 NOTE — ED Provider Notes (Signed)
[facility] Provider Note   CSN: [id number] Arrival date & time: 05/06/22  3220     History  No chief complaint on file.   Norman Peters is a 66 y.o. male.  Patient is a 66 year old male with past medical history of diabetes, hypertension, chronic renal insufficiency.  Patient presenting today with complaints of low blood sugar.  He states that he began to feel woozy and sweaty this evening at home.  His blood pressure read as low as 50.  He reports eating peanut butter and drinking juice with some improvement, then his blood sugar dropped again.  He denies any nausea or vomiting.  He denies any changes in his diet.  He denies any changes in his insulin dose, but was started on Ozempic last week.  He has received 1 injection of this.  The history is provided by the patient.       Home Medications Prior to Admission medications   Medication Sig Start Date End Date Taking? Authorizing Provider  acetaminophen (TYLENOL) 325 MG tablet Take 650 mg by mouth every 6 (six) hours as needed.    [provider]  albuterol (VENTOLIN HFA) 108 (90 Base) MCG/ACT inhaler Inhale 2 puffs into the lungs daily as needed for wheezing or shortness of breath. 01/15/22   Fayrene Helper, MD  amLODipine (NORVASC) 10 MG tablet TAKE 1 TABLET EVERY DAY 01/25/22   Fayrene Helper, MD  aspirin EC 81 MG tablet Take 81 mg by mouth every morning. 03/08/16   Fayrene Helper, MD  busPIRone (BUSPAR) 5 MG tablet Take 5 mg by mouth 2 (two) times daily.    [provider]  chlorpheniramine (CHLOR-TRIMETON) 4 MG tablet Take one tablet by mouth once daily for days , then as needed, fir sinus congestion 04/14/22   Fayrene Helper, MD  Continuous Blood Gluc Sensor ([place]) MISC by Does not apply route.    [provider]  diclofenac (VOLTAREN) 50 MG EC tablet Take 1 tablet by mouth twice daily 03/16/22   Carole Civil, MD  diclofenac Sodium (VOLTAREN) 1 %  GEL Apply 4 g topically 2 (two) times daily. 10/14/21   Paseda, Dewaine Conger, FNP  fenofibrate (TRICOR) 48 MG tablet Take 1 tablet (48 mg total) by mouth daily. 01/15/22   Fayrene Helper, MD  FLUoxetine (PROZAC) 40 MG capsule Take 40 mg by mouth daily.    [provider]  glucose blood test strip Use as instructed once daily testing dx e11.9 09/04/21   Fayrene Helper, MD  hydrochlorothiazide (HYDRODIURIL) 25 MG tablet Take 1 tablet (25 mg total) by mouth daily. 10/29/21   Fayrene Helper, MD  LANTUS SOLOSTAR 100 UNIT/ML Solostar Pen INJECT 45 UNITS UNDER THE SKIN EVERY MORNING (DOSE INCREASE) Patient taking differently: Inject 55 Units into the skin every evening. 2330 06/24/21   Fayrene Helper, MD  montelukast (SINGULAIR) 10 MG tablet Take 1 tablet by mouth once daily 02/26/22   Renee Rival, FNP  olmesartan (BENICAR) 20 MG tablet Take 1 tablet by mouth once daily 04/28/22   Fayrene Helper, MD  rosuvastatin (CRESTOR) 5 MG tablet TAKE 1 TABLET EVERY MONDAY, WEDNESDAY AND FRIDAY (DISCONTINUE PRAVASTATIN) 06/01/21   Fayrene Helper, MD  Semaglutide,0.25 or 0.'5MG'$ /DOS, (OZEMPIC, 0.25 OR 0.5 MG/DOSE,) 2 MG/3ML SOPN Inject 0.5 mg into the skin once a week. Inject 0.25 mg SQ weekly x 2 doses then increase to 0.5 mg SQ weekly thereafter if  tolerated well. 04/14/22   Brita Romp, NP      Allergies    Ibuprofen, Lisinopril, and Strawberry [berry]    Review of Systems   Review of Systems  All other systems reviewed and are negative.   Physical Exam Updated Vital Signs BP (!) 162/80   Pulse (!) 108   Temp 98.2 F (36.8 C)   Resp 19   Ht '5\' 8"'$  (1.727 m)   Wt 96.2 kg   SpO2 94%   BMI 32.23 kg/m  Physical Exam Vitals and nursing note reviewed.  Constitutional:      General: He is not in acute distress.    Appearance: He is well-developed. He is not diaphoretic.  HENT:     Head: Normocephalic and atraumatic.  Cardiovascular:     Rate and Rhythm:  Normal rate and regular rhythm.     Heart sounds: No murmur heard.    No friction rub.  Pulmonary:     Effort: Pulmonary effort is normal. No respiratory distress.     Breath sounds: Normal breath sounds. No wheezing or rales.  Abdominal:     General: Bowel sounds are normal. There is no distension.     Palpations: Abdomen is soft.     Tenderness: There is no abdominal tenderness.  Musculoskeletal:        General: Normal range of motion.     Cervical back: Normal range of motion and neck supple.  Skin:    General: Skin is warm and dry.  Neurological:     General: No focal deficit present.     Mental Status: He is alert and oriented to person, place, and time.     Cranial Nerves: No cranial nerve deficit.     Motor: No weakness.     Coordination: Coordination normal.     ED Results / Procedures / Treatments   Labs (all labs ordered are listed, but only abnormal results are displayed) Labs Reviewed  CBG MONITORING, ED - Abnormal; Notable for the following components:      Result Value   Glucose-Capillary 69 (*)    All other components within normal limits    EKG None  Radiology No results found.  Procedures Procedures    Medications Ordered in ED Medications - No data to display  ED Course/ Medical Decision Making/ A&P  Patient presenting with complaints of low blood sugar.  He reports feeling lightheaded and diaphoretic at home.  His blood sugar reading was 50.  After drinking juice and eating peanut butter, his blood sugar improved.  Initial blood sugar upon presentation is 69.  Patient given additional juice here and is now 150.  CBC and basic metabolic panel unremarkable.  I feel as though patient can safely be discharged.  He is to monitor his sugar at home and return if he experiences additional problems.  Final Clinical Impression(s) / ED Diagnoses Final diagnoses:  None    Rx / DC Orders ED Discharge Orders     None         Veryl Speak,  MD 05/06/22 [phone]

## 2022-05-06 NOTE — ED Notes (Signed)
Pt given grape juice to drink

## 2022-05-14 ENCOUNTER — Other Ambulatory Visit: Payer: Self-pay | Admitting: Family Medicine

## 2022-06-01 DIAGNOSIS — E1165 Type 2 diabetes mellitus with hyperglycemia: Secondary | ICD-10-CM | POA: Diagnosis not present

## 2022-06-02 ENCOUNTER — Other Ambulatory Visit: Payer: Self-pay

## 2022-06-02 ENCOUNTER — Encounter (HOSPITAL_COMMUNITY): Payer: Self-pay

## 2022-06-02 ENCOUNTER — Emergency Department (HOSPITAL_COMMUNITY)
Admission: EM | Admit: 2022-06-02 | Discharge: 2022-06-02 | Disposition: A | Discharge status: Home / Self Care | Payer: Medicare HMO | Attending: Emergency Medicine | Admitting: Emergency Medicine

## 2022-06-02 DIAGNOSIS — Z79899 Other long term (current) drug therapy: Secondary | ICD-10-CM | POA: Diagnosis not present

## 2022-06-02 DIAGNOSIS — T85898A Other specified complication of other internal prosthetic devices, implants and grafts, initial encounter: Secondary | ICD-10-CM | POA: Insufficient documentation

## 2022-06-02 DIAGNOSIS — E162 Hypoglycemia, unspecified: Secondary | ICD-10-CM | POA: Insufficient documentation

## 2022-06-02 DIAGNOSIS — T859XXA Unspecified complication of internal prosthetic device, implant and graft, initial encounter: Secondary | ICD-10-CM

## 2022-06-02 DIAGNOSIS — Z7982 Long term (current) use of aspirin: Secondary | ICD-10-CM | POA: Insufficient documentation

## 2022-06-02 DIAGNOSIS — Z794 Long term (current) use of insulin: Secondary | ICD-10-CM | POA: Diagnosis not present

## 2022-06-02 LAB — CBC WITH DIFFERENTIAL/PLATELET
Abs Immature Granulocytes: 0.06 10*3/uL (ref 0.00–0.07)
Basophils Absolute: 0.1 10*3/uL (ref 0.0–0.1)
Basophils Relative: 1 %
Eosinophils Absolute: 0.4 10*3/uL (ref 0.0–0.5)
Eosinophils Relative: 4 %
HCT: 43.4 % (ref 39.0–52.0)
Hemoglobin: 14.7 g/dL (ref 13.0–17.0)
Immature Granulocytes: 1 %
Lymphocytes Relative: 31 %
Lymphs Abs: 3.5 10*3/uL (ref 0.7–4.0)
MCH: 30.1 pg (ref 26.0–34.0)
MCHC: 33.9 g/dL (ref 30.0–36.0)
MCV: 88.9 fL (ref 80.0–100.0)
Monocytes Absolute: 1.2 10*3/uL — ABNORMAL HIGH (ref 0.1–1.0)
Monocytes Relative: 10 %
Neutro Abs: 6.1 10*3/uL (ref 1.7–7.7)
Neutrophils Relative %: 53 %
Platelets: 303 10*3/uL (ref 150–400)
RBC: 4.88 MIL/uL (ref 4.22–5.81)
RDW: 13.5 % (ref 11.5–15.5)
WBC: 11.3 10*3/uL — ABNORMAL HIGH (ref 4.0–10.5)
nRBC: 0 % (ref 0.0–0.2)

## 2022-06-02 LAB — BASIC METABOLIC PANEL
Anion gap: 7 (ref 5–15)
BUN: 27 mg/dL — ABNORMAL HIGH (ref 8–23)
CO2: 29 mmol/L (ref 22–32)
Calcium: 10 mg/dL (ref 8.9–10.3)
Chloride: 103 mmol/L (ref 98–111)
Creatinine, Ser: 1.83 mg/dL — ABNORMAL HIGH (ref 0.61–1.24)
GFR, Estimated: 40 mL/min — ABNORMAL LOW (ref >60)
Glucose, Bld: 129 mg/dL — ABNORMAL HIGH (ref 70–99)
Potassium: 4.2 mmol/L (ref 3.5–5.1)
Sodium: 139 mmol/L (ref 135–145)

## 2022-06-02 LAB — CBG MONITORING, ED
Glucose-Capillary: 171 mg/dL — ABNORMAL HIGH (ref 70–99)
Glucose-Capillary: 136 mg/dL — ABNORMAL HIGH (ref 70–99)

## 2022-06-02 NOTE — ED Triage Notes (Signed)
POV from home. C/o low blood sugar, reports 1 hour ago cbg 51- ate some peanut butter. Cbg 171 in triage. Repots took scheduled insulin last night at 2230- 55 units lantus .

## 2022-06-02 NOTE — Discharge Instructions (Signed)
As discussed, I recommend continuing to watch your blood glucose levels, however compare this to how you feel as you know what your symptoms are when your blood glucose is low.  I suspect your glucose monitor is not currently accurate.  As you mentioned, sometimes it can take 3 hours for a newly placed monitor to equilibrate.  Return here at any point if you start to have symptoms of hypoglycemia.  Make sure you eat dinner when you get home this evening.

## 2022-06-05 NOTE — ED Provider Notes (Signed)
[facility] EMERGENCY DEPARTMENT Provider Note   CSN: [id number] Arrival date & time: 06/02/22  1408     History  Chief Complaint  Patient presents with   Hypoglycemia    Norman Peters is a 66 y.o. male.  The history is provided by the patient.  Hypoglycemia Initial blood sugar:  51 Severity:  Moderate Onset quality:  Unable to specify Progression:  Resolved Chronicity:  New Diabetic status:  Controlled with insulin Current diabetic therapy:  Lantus and ozempic Time since last antidiabetic medication:  16 hours Relieved by:  Eating Associated symptoms: no dizziness, no shortness of breath, no sweats, no tremors and no weakness   Associated symptoms comment:  Pt denies having any symptoms. He has just replaced his Dexcom device and has concerns that it may not be monitoring correctly as he is now getting a message on his device stating error, and that it should correct itself within 3 hours.  He states this sometimes happens when changing the device.      Home Medications Prior to Admission medications   Medication Sig Start Date End Date Taking? Authorizing Provider  acetaminophen (TYLENOL) 325 MG tablet Take 650 mg by mouth every 6 (six) hours as needed.    [provider]  albuterol (VENTOLIN HFA) 108 (90 Base) MCG/ACT inhaler Inhale 2 puffs into the lungs daily as needed for wheezing or shortness of breath. 01/15/22   Fayrene Helper, MD  amLODipine (NORVASC) 10 MG tablet TAKE 1 TABLET EVERY DAY 01/25/22   Fayrene Helper, MD  aspirin EC 81 MG tablet Take 81 mg by mouth every morning. 03/08/16   Fayrene Helper, MD  busPIRone (BUSPAR) 5 MG tablet Take 5 mg by mouth 2 (two) times daily.    [provider]  chlorpheniramine (CHLOR-TRIMETON) 4 MG tablet Take one tablet by mouth once daily for days , then as needed, fir sinus congestion 04/14/22   Fayrene Helper, MD  Continuous Blood Gluc Sensor ([place]) MISC by Does not apply route.     [provider]  diclofenac (VOLTAREN) 50 MG EC tablet Take 1 tablet by mouth twice daily 03/16/22   Carole Civil, MD  diclofenac Sodium (VOLTAREN) 1 % GEL Apply 4 g topically 2 (two) times daily. 10/14/21   Paseda, Dewaine Conger, FNP  fenofibrate (TRICOR) 48 MG tablet Take 1 tablet (48 mg total) by mouth daily. 01/15/22   Fayrene Helper, MD  FLUoxetine (PROZAC) 40 MG capsule Take 40 mg by mouth daily.    [provider]  glucose blood test strip Use as instructed once daily testing dx e11.9 09/04/21   Fayrene Helper, MD  hydrochlorothiazide (HYDRODIURIL) 25 MG tablet Take 1 tablet (25 mg total) by mouth daily. 10/29/21   Fayrene Helper, MD  LANTUS SOLOSTAR 100 UNIT/ML Solostar Pen INJECT 45 UNITS UNDER THE SKIN EVERY MORNING (DOSE INCREASE) Patient taking differently: Inject 55 Units into the skin every evening. 2330 06/24/21   Fayrene Helper, MD  montelukast (SINGULAIR) 10 MG tablet Take 1 tablet by mouth once daily 02/26/22   Renee Rival, FNP  olmesartan (BENICAR) 20 MG tablet Take 1 tablet by mouth once daily 04/28/22   Fayrene Helper, MD  rosuvastatin (CRESTOR) 5 MG tablet TAKE 1 TABLET EVERY MONDAY, WEDNESDAY AND FRIDAY (DISCONTINUE PRAVASTATIN) 05/14/22   Fayrene Helper, MD  Semaglutide,0.25 or 0.'5MG'$ /DOS, (OZEMPIC, 0.25 OR 0.5 MG/DOSE,) 2 MG/3ML SOPN Inject 0.5 mg into the skin once a  week. Inject 0.25 mg SQ weekly x 2 doses then increase to 0.5 mg SQ weekly thereafter if tolerated well. 04/14/22   Brita Romp, NP      Allergies    Ibuprofen, Lisinopril, and Strawberry [berry]    Review of Systems   Review of Systems  Constitutional:  Negative for diaphoresis and fever.  HENT: Negative.    Eyes: Negative.   Respiratory:  Negative for chest tightness and shortness of breath.   Cardiovascular:  Negative for chest pain.  Gastrointestinal:  Negative for abdominal pain and nausea.  Genitourinary: Negative.   Skin: Negative.   Negative for rash and wound.  Neurological:  Negative for dizziness, tremors, syncope, weakness, light-headedness, numbness and headaches.  Psychiatric/Behavioral: Negative.  Negative for confusion.     Physical Exam Updated Vital Signs BP (!) 142/90 (BP Location: Right Arm)   Pulse 82   Temp 98.4 F (36.9 C) (Oral)   Resp 16   Ht '5\' 8"'$  (1.727 m)   Wt 95.3 kg   SpO2 98%   BMI 31.93 kg/m  Physical Exam Vitals and nursing note reviewed.  Constitutional:      General: He is not in acute distress.    Appearance: He is well-developed.  HENT:     Head: Normocephalic and atraumatic.     Mouth/Throat:     Pharynx: Oropharynx is clear.  Eyes:     Conjunctiva/sclera: Conjunctivae normal.  Cardiovascular:     Rate and Rhythm: Normal rate and regular rhythm.     Heart sounds: Normal heart sounds.  Pulmonary:     Effort: Pulmonary effort is normal.     Breath sounds: Normal breath sounds. No wheezing.  Musculoskeletal:        General: Normal range of motion.     Cervical back: Normal range of motion.  Skin:    General: Skin is warm and dry.  Neurological:     General: No focal deficit present.     Mental Status: He is alert and oriented to person, place, and time.     ED Results / Procedures / Treatments   Labs (all labs ordered are listed, but only abnormal results are displayed) Labs Reviewed  CBC WITH DIFFERENTIAL/PLATELET - Abnormal; Notable for the following components:      Result Value   WBC 11.3 (*)    Monocytes Absolute 1.2 (*)    All other components within normal limits  BASIC METABOLIC PANEL - Abnormal; Notable for the following components:   Glucose, Bld 129 (*)    BUN 27 (*)    Creatinine, Ser 1.83 (*)    GFR, Estimated 40 (*)    All other components within normal limits  CBG MONITORING, ED - Abnormal; Notable for the following components:   Glucose-Capillary 171 (*)    All other components within normal limits  CBG MONITORING, ED - Abnormal; Notable  for the following components:   Glucose-Capillary 136 (*)    All other components within normal limits    EKG None  Radiology No results found.  Procedures Procedures    Medications Ordered in ED Medications - No data to display  ED Course/ Medical Decision Making/ A&P                           Medical Decision Making Pt with asymptomatic hypoglycemia per his newly changed out Dexcom device.  His cbg has remained normal to elevated here and pt has been  asymptomatic.  Last cbg prior to dc 136, his device still documenting error.  He was advised he might need to replace the sensor if this does not correct itself as anticipated.  Advised to return here for any sx suggesting hypoglycemia, in the interim.  He does have a regular cbg device but not charged, advised to charge it for temp use.  Amount and/or Complexity of Data Reviewed Labs: ordered.    Details: Labs stable, cbg stable during and prior to dc.  Risk Prescription drug management. Risk Details: No change in prescription indicated.             Final Clinical Impression(s) / ED Diagnoses Final diagnoses:  Hypoglycemia  Complication due to device, implant, or graft, initial encounter    Rx / DC Orders ED Discharge Orders     None         Landis Martins 06/05/22 6060    Fredia Sorrow, MD 06/08/22 [phone]

## 2022-06-10 ENCOUNTER — Other Ambulatory Visit: Payer: Self-pay | Admitting: Family Medicine

## 2022-06-14 ENCOUNTER — Encounter (HOSPITAL_COMMUNITY): Payer: Self-pay | Admitting: Hematology

## 2022-06-16 ENCOUNTER — Ambulatory Visit (INDEPENDENT_AMBULATORY_CARE_PROVIDER_SITE_OTHER): Payer: Medicare HMO | Admitting: Family Medicine

## 2022-06-16 ENCOUNTER — Encounter: Payer: Self-pay | Admitting: Family Medicine

## 2022-06-16 VITALS — BP 123/75 | HR 93 | Ht 68.0 in | Wt 212.0 lb

## 2022-06-16 DIAGNOSIS — E1122 Type 2 diabetes mellitus with diabetic chronic kidney disease: Secondary | ICD-10-CM | POA: Diagnosis not present

## 2022-06-16 DIAGNOSIS — Z794 Long term (current) use of insulin: Secondary | ICD-10-CM

## 2022-06-16 DIAGNOSIS — E782 Mixed hyperlipidemia: Secondary | ICD-10-CM | POA: Diagnosis not present

## 2022-06-16 DIAGNOSIS — G8929 Other chronic pain: Secondary | ICD-10-CM | POA: Diagnosis not present

## 2022-06-16 DIAGNOSIS — N1831 Chronic kidney disease, stage 3a: Secondary | ICD-10-CM

## 2022-06-16 DIAGNOSIS — M5440 Lumbago with sciatica, unspecified side: Secondary | ICD-10-CM | POA: Diagnosis not present

## 2022-06-16 DIAGNOSIS — I10 Essential (primary) hypertension: Secondary | ICD-10-CM

## 2022-06-16 DIAGNOSIS — M2550 Pain in unspecified joint: Secondary | ICD-10-CM | POA: Diagnosis not present

## 2022-06-16 MED ORDER — TRAMADOL HCL 50 MG PO TABS: ORAL_TABLET | ORAL | 0 refills | Status: DC

## 2022-06-16 NOTE — Assessment & Plan Note (Addendum)
C/o pain in shoulder, wrists, back knees, ankles , feet rated at 8 to 10 , has deformity of digits, refer to rheumatology

## 2022-06-16 NOTE — Patient Instructions (Signed)
F/U in August as before, call if you need me sooner, flu vaccine at visit, keep that visit , I had said to reschedule but needs to keep it pls let him know  Tramadol is prescribed for 5 days for pain, continue tylenol  Need fasting lipid, cmp and eGr and rheumatoid factor la in next 5 days  You are being referred to rheumatology to evaluate your pain  Careful not to fall  Thanks for choosing [facility], we consider it a privelige to serve you.

## 2022-06-21 ENCOUNTER — Encounter: Payer: Self-pay | Admitting: Family Medicine

## 2022-06-21 DIAGNOSIS — M549 Dorsalgia, unspecified: Secondary | ICD-10-CM | POA: Insufficient documentation

## 2022-06-21 NOTE — Assessment & Plan Note (Signed)
Increasingly disabling pain, tramadol one at bedtime as needed for  Total 10 days

## 2022-06-21 NOTE — Progress Notes (Signed)
Norman Peters     MRN: [id number]      DOB: 03/29/1956   HPI Norman Peters is here with a main c/o increasingly debilitating generalized joint pAINAND STIFFNESS, ANS MARKED MID AND LOW BACK PAIN AFFECTING HIS ABILITY TO SAFELY AMBULATE Needs and is reafy for re evaluation by Rheumatology due to progression of debilitating symptoms Blood sugar still fluctuating and often over 200, he is working on this however Denies polyuria, polydipsia, blurred vision , or hypoglycemic episodes.  ROS Denies recent fever or chills. Denies sinus pressure, nasal congestion, ear pain or sore throat. Denies chest congestion, productive cough or wheezing. Denies chest pains, palpitations and leg swelling Denies abdominal pain, nausea, vomiting,diarrhea or constipation.   Denies dysuria, frequency, hesitancy or incontinence. Denies headaches, seizures, Denies skin break down or rash.   PE  BP 123/75 (BP Location: Left Arm, Patient Position: Sitting, Cuff Size: Large)   Pulse 93   Ht '5\' 8"'$  (1.727 m)   Wt 212 lb (96.2 kg)   SpO2 91%   BMI 32.23 kg/m   Patient alert and oriented and in no cardiopulmonary distress.  HEENT: No facial asymmetry, EOMI,     Neck supple .  Chest: Clear to auscultation bilaterally.  CVS: S1, S2 no murmurs, no S3.Regular rate.  ABD: Soft non tender.   Ext: No edema  MS: decreased  ROM ; lumbar  spine, shoulders, hips and knees.marked deformity of fingers  Skin: Intact, no ulcerations or rash noted.  Psych: Good eye contact, normal affect. Memory intact not anxious or depressed appearing.  CNS: CN 2-12 intact, power,  normal throughout.no focal deficits noted.   Assessment & Plan  Pain in joint involving multiple sites C/o pain in shoulder, wrists, back knees, ankles , feet rated at 8 to 10 , has deformity of digits, refer to rheumatology  Back pain Increasingly disabling pain, tramadol one at bedtime as needed for  Total 10 days  Essential hypertension,  benign Controlled, no change in medication DASH diet and commitment to daily physical activity for a minimum of 30 minutes discussed and encouraged, as a part of hypertension management. The importance of attaining a healthy weight is also discussed.     06/16/2022   11:18 AM 06/02/2022    6:43 PM 06/02/2022    2:16 PM 06/02/2022    2:15 PM 05/06/2022    2:00 AM 05/06/2022    1:00 AM 05/06/2022   12:32 AM  BP/Weight  Systolic BP 749 449  675 916 384 665  Diastolic BP 75 90  78 73 88 80  Wt. (Lbs) 212  210    212  BMI 32.23 kg/m2  31.93 kg/m2    32.23 kg/m2       Type 2 diabetes mellitus with stage 3a chronic kidney disease, with long-term current use of insulin [facility]) Norman Peters is reminded of the importance of commitment to daily physical activity for 30 minutes or more, as able and the need to limit carbohydrate intake to 30 to 60 grams per meal to help with blood sugar control.   The need to take medication as prescribed, test blood sugar as directed, and to call between visits if there is a concern that blood sugar is uncontrolled is also discussed.   Norman Peters is reminded of the importance of daily foot exam, annual eye examination, and good blood sugar, blood pressure and cholesterol control. Uncontrolled, managed by Endo     Latest Ref Rng & Units 06/02/2022  5:04 PM 05/06/2022    1:01 AM 03/25/2022    2:25 PM 03/04/2022    2:35 PM 03/04/2022    2:01 PM  Diabetic Labs  HbA1c 0.0 - 7.0 %     10.7   Microalbumin mg/L   150     Micro/Creat Ratio    30-300     Creatinine 0.61 - 1.24 mg/dL 1.83  1.92   1.83        06/16/2022   11:18 AM 06/02/2022    6:43 PM 06/02/2022    2:16 PM 06/02/2022    2:15 PM 05/06/2022    2:00 AM 05/06/2022    1:00 AM 05/06/2022   12:32 AM  BP/Weight  Systolic BP 242 683  419 622 297 989  Diastolic BP 75 90  78 73 88 80  Wt. (Lbs) 212  210    212  BMI 32.23 kg/m2  31.93 kg/m2    32.23 kg/m2      Latest Ref Rng & Units 01/15/2022    1:00 PM 07/01/2021    12:00 AM  Foot/eye exam completion dates  Eye Exam No Retinopathy  Retinopathy      Foot Form Completion  Done      This result is from an external source.

## 2022-06-21 NOTE — Assessment & Plan Note (Signed)
Controlled, no change in medication DASH diet and commitment to daily physical activity for a minimum of 30 minutes discussed and encouraged, as a part of hypertension management. The importance of attaining a healthy weight is also discussed.     06/16/2022   11:18 AM 06/02/2022    6:43 PM 06/02/2022    2:16 PM 06/02/2022    2:15 PM 05/06/2022    2:00 AM 05/06/2022    1:00 AM 05/06/2022   12:32 AM  BP/Weight  Systolic BP 224 114  643 142 767 011  Diastolic BP 75 90  78 73 88 80  Wt. (Lbs) 212  210    212  BMI 32.23 kg/m2  31.93 kg/m2    32.23 kg/m2

## 2022-06-21 NOTE — Assessment & Plan Note (Signed)
Norman Peters is reminded of the importance of commitment to daily physical activity for 30 minutes or more, as able and the need to limit carbohydrate intake to 30 to 60 grams per meal to help with blood sugar control.   The need to take medication as prescribed, test blood sugar as directed, and to call between visits if there is a concern that blood sugar is uncontrolled is also discussed.   Norman Peters is reminded of the importance of daily foot exam, annual eye examination, and good blood sugar, blood pressure and cholesterol control. Uncontrolled, managed by Endo     Latest Ref Rng & Units 06/02/2022    5:04 PM 05/06/2022    1:01 AM 03/25/2022    2:25 PM 03/04/2022    2:35 PM 03/04/2022    2:01 PM  Diabetic Labs  HbA1c 0.0 - 7.0 %     10.7   Microalbumin mg/L   150     Micro/Creat Ratio    30-300     Creatinine 0.61 - 1.24 mg/dL 1.83  1.92   1.83        06/16/2022   11:18 AM 06/02/2022    6:43 PM 06/02/2022    2:16 PM 06/02/2022    2:15 PM 05/06/2022    2:00 AM 05/06/2022    1:00 AM 05/06/2022   12:32 AM  BP/Weight  Systolic BP 161 096  045 409 811 914  Diastolic BP 75 90  78 73 88 80  Wt. (Lbs) 212  210    212  BMI 32.23 kg/m2  31.93 kg/m2    32.23 kg/m2      Latest Ref Rng & Units 01/15/2022    1:00 PM 07/01/2021   12:00 AM  Foot/eye exam completion dates  Eye Exam No Retinopathy  Retinopathy      Foot Form Completion  Done      This result is from an external source.

## 2022-06-22 DIAGNOSIS — H524 Presbyopia: Secondary | ICD-10-CM | POA: Diagnosis not present

## 2022-06-26 ENCOUNTER — Other Ambulatory Visit: Payer: Self-pay | Admitting: Family Medicine

## 2022-06-26 ENCOUNTER — Other Ambulatory Visit: Payer: Self-pay | Admitting: Nurse Practitioner

## 2022-06-28 MED ORDER — DICLOFENAC SODIUM 1 % EX GEL: 4.0000 g | Freq: Two times a day (BID) | CUTANEOUS | 0 refills | Status: DC

## 2022-06-28 MED ORDER — CHLORPHENIRAMINE MALEATE 4 MG PO TABS: ORAL_TABLET | ORAL | 0 refills | Status: DC

## 2022-06-28 MED ORDER — OLMESARTAN MEDOXOMIL 20 MG PO TABS: 20.0000 mg | ORAL_TABLET | Freq: Every day | ORAL | 0 refills | Status: DC

## 2022-06-30 ENCOUNTER — Encounter: Payer: Self-pay | Admitting: Family Medicine

## 2022-06-30 ENCOUNTER — Ambulatory Visit (INDEPENDENT_AMBULATORY_CARE_PROVIDER_SITE_OTHER): Payer: Medicare HMO | Admitting: Family Medicine

## 2022-06-30 ENCOUNTER — Encounter (HOSPITAL_COMMUNITY): Payer: Self-pay | Admitting: Hematology

## 2022-06-30 ENCOUNTER — Other Ambulatory Visit: Payer: Self-pay | Admitting: Family Medicine

## 2022-06-30 VITALS — BP 129/77 | HR 90 | Ht 68.0 in | Wt 213.0 lb

## 2022-06-30 DIAGNOSIS — I10 Essential (primary) hypertension: Secondary | ICD-10-CM | POA: Diagnosis not present

## 2022-06-30 DIAGNOSIS — F419 Anxiety disorder, unspecified: Secondary | ICD-10-CM | POA: Diagnosis not present

## 2022-06-30 DIAGNOSIS — E669 Obesity, unspecified: Secondary | ICD-10-CM

## 2022-06-30 DIAGNOSIS — F32A Depression, unspecified: Secondary | ICD-10-CM | POA: Diagnosis not present

## 2022-06-30 DIAGNOSIS — E782 Mixed hyperlipidemia: Secondary | ICD-10-CM | POA: Diagnosis not present

## 2022-06-30 DIAGNOSIS — M2550 Pain in unspecified joint: Secondary | ICD-10-CM

## 2022-06-30 DIAGNOSIS — Z0289 Encounter for other administrative examinations: Secondary | ICD-10-CM

## 2022-06-30 DIAGNOSIS — G8929 Other chronic pain: Secondary | ICD-10-CM

## 2022-06-30 DIAGNOSIS — E1140 Type 2 diabetes mellitus with diabetic neuropathy, unspecified: Secondary | ICD-10-CM | POA: Diagnosis not present

## 2022-06-30 MED ORDER — TRAMADOL HCL 50 MG PO TABS: 50.0000 mg | ORAL_TABLET | Freq: Two times a day (BID) | ORAL | 2 refills | Status: DC

## 2022-06-30 MED ORDER — AMLODIPINE-OLMESARTAN 10-20 MG PO TABS
1.0000 | ORAL_TABLET | Freq: Every day | ORAL | 2 refills | Status: DC
Start: 2022-06-30 — End: 2022-08-24

## 2022-06-30 NOTE — Patient Instructions (Addendum)
Annual exam 12 /27 or after , call if you need me sooner  F/u in 11 weeks chronic problems and pain management  Call and come for Flu vaccine in Sept/ October  UDS today and pain contract signed   New is tramadol 1 twice  daily  Stop amlodipine 10 mg  Stop olmesartan 20 mg  New is COMBINATION amlodipine/Olmesartan 10/20 oNE daily (nurse pls call and verify at the pharmacy also)  Fasting labs ordered earlier this month need to be done in the next 1 week please  Thanks for choosing [facility], we consider it a privelige to serve you.

## 2022-07-01 ENCOUNTER — Other Ambulatory Visit: Payer: Self-pay

## 2022-07-01 ENCOUNTER — Encounter (HOSPITAL_COMMUNITY): Payer: Self-pay | Admitting: Hematology

## 2022-07-01 MED ORDER — DEXCOM G7 SENSOR MISC
5 refills | Status: DC
  Filled 2022-07-01: qty 1, fill #0
  Filled 2022-07-06 – 2022-07-12 (×3): qty 1, 10d supply, fill #0

## 2022-07-02 DIAGNOSIS — E1165 Type 2 diabetes mellitus with hyperglycemia: Secondary | ICD-10-CM | POA: Diagnosis not present

## 2022-07-07 ENCOUNTER — Encounter (HOSPITAL_COMMUNITY): Payer: Self-pay | Admitting: Hematology

## 2022-07-07 ENCOUNTER — Other Ambulatory Visit (HOSPITAL_COMMUNITY): Payer: Self-pay

## 2022-07-07 LAB — TOXASSURE SELECT 13 (MW), URINE

## 2022-07-09 ENCOUNTER — Other Ambulatory Visit (HOSPITAL_COMMUNITY): Payer: Self-pay

## 2022-07-12 ENCOUNTER — Encounter: Payer: Self-pay | Admitting: Family Medicine

## 2022-07-12 DIAGNOSIS — G8929 Other chronic pain: Secondary | ICD-10-CM | POA: Insufficient documentation

## 2022-07-12 NOTE — Assessment & Plan Note (Signed)
Hyperlipidemia:Low fat diet discussed and encouraged.   Lipid Panel  Lab Results  Component Value Date   CHOL 95 (L) 12/28/2021   HDL 30 (L) 12/28/2021   LDLCALC 29 12/28/2021   TRIG 233 (H) 12/28/2021   CHOLHDL 3.2 12/28/2021  uncontrolled , updated lab needed

## 2022-07-12 NOTE — Assessment & Plan Note (Signed)
Controlled, no change in medication  

## 2022-07-12 NOTE — Assessment & Plan Note (Signed)
The patient's Controlled Substance registry is reviewed and compliance confirmed. Adequacy of  Pain control and level of function is assessed. Medication dosing is adjusted as deemed appropriate. Twelve weeks of medication is prescribed , with a follow up appointment between 11 to 12 weeks . This is start of pain contract which is reviewed, UDS sent, and pain management with tramadol twice daily  started

## 2022-07-12 NOTE — Progress Notes (Signed)
Norman Peters     MRN: [id number]      DOB: [birth year]   HPI Norman Peters is here for follow up and re-evaluation of chronic medical conditions, medication management and review of any available recent lab and radiology data.  Preventive health is updated, specifically  Cancer screening and Immunization.   Questions or concerns regarding consultations or procedures which the PT has had in the interim are  addressed. The PT denies any adverse reactions to current medications since the last visit.  C/o chronic uncontrolled pain awaiting appointment with rheumatology   ROS Denies recent fever or chills. Denies sinus pressure, nasal congestion, ear pain or sore throat. Denies chest congestion, productive cough or wheezing. Denies chest pains, palpitations and leg swelling Denies abdominal pain, nausea, vomiting,diarrhea or constipation.   Denies dysuria, frequency, hesitancy or incontinence. C/o  joint pain, swelling and limitation in mobility. Denies headaches, seizures, numbness, or tingling. Denies depression, anxiety or insomnia. Denies skin break down or rash.   PE  BP 129/77 (BP Location: Right Arm, Patient Position: Sitting, Cuff Size: Large)   Pulse 90   Ht '5\' 8"'$  (1.727 m)   Wt 213 lb (96.6 kg)   SpO2 92%   BMI 32.39 kg/m   Patient alert and oriented and in no cardiopulmonary distress.  HEENT: No facial asymmetry, EOMI,     Neck supple .  Chest: Clear to auscultation bilaterally.  CVS: S1, S2 no murmurs, no S3.Regular rate.  ABD: Soft non tender.   Ext: No edema  MS: Decreased  ROM spine, shoulders, hips and knees.  Skin: Intact, no ulcerations or rash noted.  Psych: Good eye contact, normal affect. Memory intact not anxious or depressed appearing.  CNS: CN 2-12 intact, power,  normal throughout.no focal deficits noted.   Assessment & Plan  Essential hypertension, benign Controlled, no change in medication dose, will change to combined pill if covered DASH  diet and commitment to daily physical activity for a minimum of 30 minutes discussed and encouraged, as a part of hypertension management. The importance of attaining a healthy weight is also discussed.     06/30/2022    1:00 PM 06/16/2022   11:18 AM 06/02/2022    6:43 PM 06/02/2022    2:16 PM 06/02/2022    2:15 PM 05/06/2022    2:00 AM 05/06/2022    1:00 AM  BP/Weight  Systolic BP 478 295 621  308 657 846  Diastolic BP 77 75 90  78 73 88  Wt. (Lbs) 213 212  210     BMI 32.39 kg/m2 32.23 kg/m2  31.93 kg/m2          Mixed hyperlipidemia Hyperlipidemia:Low fat diet discussed and encouraged.   Lipid Panel  Lab Results  Component Value Date   CHOL 95 (L) 12/28/2021   HDL 30 (L) 12/28/2021   LDLCALC 29 12/28/2021   TRIG 233 (H) 12/28/2021   CHOLHDL 3.2 12/28/2021  uncontrolled , updated lab needed     Pain in joint involving multiple sites Start twice daily tramadol  Type 2 diabetes mellitus with diabetic neuropathy, unspecified ([place]) Norman Peters is reminded of the importance of commitment to daily physical activity for 30 minutes or more, as able and the need to limit carbohydrate intake to 30 to 60 grams per meal to help with blood sugar control.   The need to take medication as prescribed, test blood sugar as directed, and to call between visits if there is a concern that blood  sugar is uncontrolled is also discussed.   Norman Peters is reminded of the importance of daily foot exam, annual eye examination, and good blood sugar, blood pressure and cholesterol control. Uncontrolled, managed by Endo     Latest Ref Rng & Units 06/02/2022    5:04 PM 05/06/2022    1:01 AM 03/25/2022    2:25 PM 03/04/2022    2:35 PM 03/04/2022    2:01 PM  Diabetic Labs  HbA1c 0.0 - 7.0 %     10.7   Microalbumin mg/L   150     Micro/Creat Ratio    30-300     Creatinine 0.61 - 1.24 mg/dL 1.83  1.92   1.83        06/30/2022    1:00 PM 06/16/2022   11:18 AM 06/02/2022    6:43 PM 06/02/2022    2:16 PM  06/02/2022    2:15 PM 05/06/2022    2:00 AM 05/06/2022    1:00 AM  BP/Weight  Systolic BP 211 941 740  814 481 856  Diastolic BP 77 75 90  78 73 88  Wt. (Lbs) 213 212  210     BMI 32.39 kg/m2 32.23 kg/m2  31.93 kg/m2         Latest Ref Rng & Units 01/15/2022    1:00 PM 07/01/2021   12:00 AM  Foot/eye exam completion dates  Eye Exam No Retinopathy  Retinopathy      Foot Form Completion  Done      This result is from an external source.        Anxiety and depression Controlled, no change in medication   Obesity (BMI 30.0-34.9)  Patient re-educated about  the importance of commitment to a  minimum of 150 minutes of exercise per week as able.  The importance of healthy food choices with portion control discussed, as well as eating regularly and within a 12 hour window most days. The need to choose "clean , [place]" food 50 to 75% of the time is discussed, as well as to make water the primary drink and set a goal of 64 ounces water daily.       06/30/2022    1:00 PM 06/16/2022   11:18 AM 06/02/2022    2:16 PM  Weight /BMI  Weight 213 lb 212 lb 210 lb  Height '5\' 8"'$  (1.727 m) '5\' 8"'$  (1.727 m) '5\' 8"'$  (1.727 m)  BMI 32.39 kg/m2 32.23 kg/m2 31.93 kg/m2      Encounter for chronic pain management The patient's Controlled Substance registry is reviewed and compliance confirmed. Adequacy of  Pain control and level of function is assessed. Medication dosing is adjusted as deemed appropriate. Twelve weeks of medication is prescribed , with a follow up appointment between 11 to 12 weeks . This is start of pain contract which is reviewed, UDS sent, and pain management with tramadol twice daily  started '

## 2022-07-12 NOTE — Assessment & Plan Note (Signed)
Norman Peters is reminded of the importance of commitment to daily physical activity for 30 minutes or more, as able and the need to limit carbohydrate intake to 30 to 60 grams per meal to help with blood sugar control.   The need to take medication as prescribed, test blood sugar as directed, and to call between visits if there is a concern that blood sugar is uncontrolled is also discussed.   Norman Peters is reminded of the importance of daily foot exam, annual eye examination, and good blood sugar, blood pressure and cholesterol control. Uncontrolled, managed by Endo     Latest Ref Rng & Units 06/02/2022    5:04 PM 05/06/2022    1:01 AM 03/25/2022    2:25 PM 03/04/2022    2:35 PM 03/04/2022    2:01 PM  Diabetic Labs  HbA1c 0.0 - 7.0 %     10.7   Microalbumin mg/L   150     Micro/Creat Ratio    30-300     Creatinine 0.61 - 1.24 mg/dL 1.83  1.92   1.83        06/30/2022    1:00 PM 06/16/2022   11:18 AM 06/02/2022    6:43 PM 06/02/2022    2:16 PM 06/02/2022    2:15 PM 05/06/2022    2:00 AM 05/06/2022    1:00 AM  BP/Weight  Systolic BP 268 341 962  229 798 921  Diastolic BP 77 75 90  78 73 88  Wt. (Lbs) 213 212  210     BMI 32.39 kg/m2 32.23 kg/m2  31.93 kg/m2         Latest Ref Rng & Units 01/15/2022    1:00 PM 07/01/2021   12:00 AM  Foot/eye exam completion dates  Eye Exam No Retinopathy  Retinopathy      Foot Form Completion  Done      This result is from an external source.

## 2022-07-12 NOTE — Assessment & Plan Note (Signed)
  Patient re-educated about  the importance of commitment to a  minimum of 150 minutes of exercise per week as able.  The importance of healthy food choices with portion control discussed, as well as eating regularly and within a 12 hour window most days. The need to choose "clean , [place]" food 50 to 75% of the time is discussed, as well as to make water the primary drink and set a goal of 64 ounces water daily.       06/30/2022    1:00 PM 06/16/2022   11:18 AM 06/02/2022    2:16 PM  Weight /BMI  Weight 213 lb 212 lb 210 lb  Height '5\' 8"'$  (1.727 m) '5\' 8"'$  (1.727 m) '5\' 8"'$  (1.727 m)  BMI 32.39 kg/m2 32.23 kg/m2 31.93 kg/m2

## 2022-07-12 NOTE — Assessment & Plan Note (Addendum)
Controlled, no change in medication dose, will change to combined pill if covered DASH diet and commitment to daily physical activity for a minimum of 30 minutes discussed and encouraged, as a part of hypertension management. The importance of attaining a healthy weight is also discussed.     06/30/2022    1:00 PM 06/16/2022   11:18 AM 06/02/2022    6:43 PM 06/02/2022    2:16 PM 06/02/2022    2:15 PM 05/06/2022    2:00 AM 05/06/2022    1:00 AM  BP/Weight  Systolic BP 124 580 998  338 250 539  Diastolic BP 77 75 90  78 73 88  Wt. (Lbs) 213 212  210     BMI 32.39 kg/m2 32.23 kg/m2  31.93 kg/m2

## 2022-07-12 NOTE — Assessment & Plan Note (Signed)
Start twice daily tramadol

## 2022-07-13 ENCOUNTER — Other Ambulatory Visit (HOSPITAL_COMMUNITY): Payer: Self-pay

## 2022-07-13 ENCOUNTER — Encounter (HOSPITAL_COMMUNITY): Payer: Self-pay | Admitting: Hematology

## 2022-07-13 ENCOUNTER — Other Ambulatory Visit: Payer: Self-pay | Admitting: Family Medicine

## 2022-07-14 ENCOUNTER — Other Ambulatory Visit: Payer: Self-pay

## 2022-07-14 ENCOUNTER — Telehealth: Payer: Self-pay

## 2022-07-14 ENCOUNTER — Other Ambulatory Visit: Payer: Self-pay | Admitting: Nurse Practitioner

## 2022-07-14 DIAGNOSIS — Z0100 Encounter for examination of eyes and vision without abnormal findings: Secondary | ICD-10-CM | POA: Diagnosis not present

## 2022-07-14 DIAGNOSIS — H52221 Regular astigmatism, right eye: Secondary | ICD-10-CM | POA: Diagnosis not present

## 2022-07-14 DIAGNOSIS — H524 Presbyopia: Secondary | ICD-10-CM | POA: Diagnosis not present

## 2022-07-14 MED ORDER — DEXCOM G7 SENSOR MISC
5 refills | Status: DC
Start: 2022-07-14 — End: 2022-07-20

## 2022-07-14 NOTE — Telephone Encounter (Signed)
Resent to [facility] per patient request.

## 2022-07-14 NOTE — Telephone Encounter (Signed)
Patient called said his  Continuous Blood Gluc Sensor ([place]) MISC  Went to the wrong pharmacy.  Pharmacy: [facility]

## 2022-07-15 ENCOUNTER — Other Ambulatory Visit: Payer: Self-pay | Admitting: Family Medicine

## 2022-07-20 ENCOUNTER — Ambulatory Visit (INDEPENDENT_AMBULATORY_CARE_PROVIDER_SITE_OTHER): Payer: Medicare HMO | Admitting: Nurse Practitioner

## 2022-07-20 ENCOUNTER — Encounter: Payer: Self-pay | Admitting: Nurse Practitioner

## 2022-07-20 VITALS — BP 126/79 | HR 82 | Ht 68.0 in | Wt 209.2 lb

## 2022-07-20 DIAGNOSIS — I10 Essential (primary) hypertension: Secondary | ICD-10-CM

## 2022-07-20 DIAGNOSIS — E782 Mixed hyperlipidemia: Secondary | ICD-10-CM | POA: Diagnosis not present

## 2022-07-20 DIAGNOSIS — N1832 Chronic kidney disease, stage 3b: Secondary | ICD-10-CM | POA: Diagnosis not present

## 2022-07-20 DIAGNOSIS — Z794 Long term (current) use of insulin: Secondary | ICD-10-CM

## 2022-07-20 DIAGNOSIS — E1122 Type 2 diabetes mellitus with diabetic chronic kidney disease: Secondary | ICD-10-CM | POA: Diagnosis not present

## 2022-07-20 LAB — POCT GLYCOSYLATED HEMOGLOBIN (HGB A1C): Hemoglobin A1C: 6.8 % — AB (ref 4.0–5.6)

## 2022-07-20 MED ORDER — HYDROCHLOROTHIAZIDE 25 MG PO TABS: 25.0000 mg | ORAL_TABLET | Freq: Every day | ORAL | 5 refills | Status: DC

## 2022-07-20 MED ORDER — DEXCOM G7 SENSOR MISC: 6 refills | Status: DC

## 2022-07-20 MED ORDER — LANTUS SOLOSTAR 100 UNIT/ML ~~LOC~~ SOPN
40.0000 [IU] | PEN_INJECTOR | Freq: Every day | SUBCUTANEOUS | 3 refills | Status: DC
Start: 2022-07-20 — End: 2022-11-19

## 2022-07-20 NOTE — Progress Notes (Signed)
Endocrinology follow-up note    07/20/2022, 2:18 PM   Subjective:    Patient ID: Norman Peters, male    DOB: Feb 08, 1956.  Norman Peters is being seen in follow-up after he was seen in consultation for management of currently uncontrolled symptomatic diabetes requested by  Fayrene Helper, MD.   Past Medical History:  Diagnosis Date   Acute left-sided low back pain with left-sided sciatica 11/04/2020   Anxiety    Arthritis    Depression 2001   Diabetes mellitus 2010   GERD (gastroesophageal reflux disease)    Hyperlipidemia    Hypertension 2005    Past Surgical History:  Procedure Laterality Date   COLONOSCOPY N/A 07/07/2016   Procedure: COLONOSCOPY;  Surgeon: Rogene Houston, MD;  Location: [facility];  Service: Endoscopy;  Laterality: N/A;  1030   FINGER SURGERY Left 1974   fifth , trauma on job, had to replace/repair finger   HERNIA REPAIR  2330   x3, umbilical and bilateral hernia    Social History   Socioeconomic History   Marital status: Married    Spouse name: Not on file   Number of children: Not on file   Years of education: Not on file   Highest education level: Not on file  Occupational History   Not on file  Tobacco Use   Smoking status: Former    Packs/day: 0.50    Years: 30.00    Total pack years: 15.00    Types: Cigarettes    Quit date: 01/20/2011    Years since quitting: 11.5   Smokeless tobacco: Never  Vaping Use   Vaping Use: Never used  Substance and Sexual Activity   Alcohol use: No   Drug use: No   Sexual activity: Not Currently  Other Topics Concern   Not on file  Social History Narrative   Not on file   Social Determinants of Health   Financial Resource Strain: Low Risk  (04/22/2022)   Overall Financial Resource Strain (CARDIA)    Difficulty of Paying Living Expenses: Not hard at all  Food Insecurity: No Food Insecurity (04/22/2022)   Hunger  Vital Sign    Worried About Running Out of Food in the Last Year: Never true    Ran Out of Food in the Last Year: Never true  Transportation Needs: No Transportation Needs (04/22/2022)   PRAPARE - Hydrologist (Medical): No    Lack of Transportation (Non-Medical): No  Physical Activity: Insufficiently Active (04/22/2022)   Exercise Vital Sign    Days of Exercise per Week: 2 days    Minutes of Exercise per Session: 20 min  Stress: No Stress Concern Present (04/22/2022)   [place]    Feeling of Stress : Not at all  Social Connections: Moderately Integrated (04/22/2022)   Social Connection and Isolation Panel [NHANES]    Frequency of Communication with Friends and Family: More than three times a week    Frequency of Social Gatherings with Friends and Family: More than three times a week    Attends Religious  Services: More than 4 times per year    Active Member of Clubs or Organizations: No    Attends Archivist Meetings: Never    Marital Status: Married    Family History  Problem Relation Age of Onset   Stroke Mother     Outpatient Encounter Medications as of 07/20/2022  Medication Sig   acetaminophen (TYLENOL) 325 MG tablet Take 650 mg by mouth every 6 (six) hours as needed.   albuterol (VENTOLIN HFA) 108 (90 Base) MCG/ACT inhaler Inhale 2 puffs into the lungs daily as needed for wheezing or shortness of breath.   amLODipine (NORVASC) 10 MG tablet TAKE 1 TABLET EVERY DAY   amlodipine-olmesartan (AZOR) 10-20 MG tablet Take 1 tablet by mouth daily.   aspirin EC 81 MG tablet Take 81 mg by mouth every morning.   busPIRone (BUSPAR) 5 MG tablet Take 5 mg by mouth 2 (two) times daily.   chlorpheniramine (CHLOR-TRIMETON) 4 MG tablet Take one tablet by mouth once daily for days , then as needed, fir sinus congestion   diclofenac (VOLTAREN) 50 MG EC tablet Take 1 tablet by mouth twice daily    diclofenac Sodium (VOLTAREN) 1 % GEL APPLY 4  GRAMS TOPICALLY TWICE DAILY   fenofibrate (TRICOR) 48 MG tablet Take 1 tablet (48 mg total) by mouth daily.   FLUoxetine (PROZAC) 40 MG capsule TAKE 1 CAPSULE EVERY DAY   glucose blood test strip Use as instructed once daily testing dx e11.9   montelukast (SINGULAIR) 10 MG tablet Take 1 tablet by mouth once daily   olmesartan (BENICAR) 20 MG tablet Take 1 tablet (20 mg total) by mouth daily.   rosuvastatin (CRESTOR) 5 MG tablet TAKE 1 TABLET EVERY MONDAY, WEDNESDAY AND FRIDAY (DISCONTINUE PRAVASTATIN)   Semaglutide,0.25 or 0.'5MG'$ /DOS, (OZEMPIC, 0.25 OR 0.5 MG/DOSE,) 2 MG/3ML SOPN Inject 0.5 mg into the skin once a week. Inject 0.25 mg SQ weekly x 2 doses then increase to 0.5 mg SQ weekly thereafter if tolerated well.   traMADol (ULTRAM) 50 MG tablet Take 1 tablet (50 mg total) by mouth 2 (two) times daily.   [DISCONTINUED] Continuous Blood Gluc Sensor (DEXCOM G7 SENSOR) MISC Use to check blood sugar   [DISCONTINUED] hydrochlorothiazide (HYDRODIURIL) 25 MG tablet Take 1 tablet (25 mg total) by mouth daily.   [DISCONTINUED] LANTUS SOLOSTAR 100 UNIT/ML Solostar Pen INJECT 45 UNITS UNDER THE SKIN EVERY MORNING (DOSE INCREASE)   Continuous Blood Gluc Sensor (DEXCOM G7 SENSOR) MISC Use to check blood sugar   hydrochlorothiazide (HYDRODIURIL) 25 MG tablet Take 1 tablet (25 mg total) by mouth daily.   insulin glargine (LANTUS SOLOSTAR) 100 UNIT/ML Solostar Pen Inject 40 Units into the skin at bedtime.   No facility-administered encounter medications on file as of 07/20/2022.    ALLERGIES: Allergies  Allergen Reactions   Ibuprofen Rash   Lisinopril Cough   Strawberry [Berry] Hives and Swelling    TONGUE SWELLING    VACCINATION STATUS: Immunization History  Administered Date(s) Administered   Fluad Quad(high Dose 65+) 09/02/2020, 07/17/2021   Influenza,inj,Quad PF,6+ Mos 07/14/2016, 08/25/2017, 08/30/2018, 07/02/2019   PFIZER(Purple Top)SARS-COV-2  Vaccination 01/25/2020, 02/19/2020, 09/28/2020, 05/29/2021, 09/25/2021   PNEUMOCOCCAL CONJUGATE-20 07/17/2021   Pfizer Covid-19 Vaccine Bivalent Booster 24yr & up 09/25/2021   Pneumococcal Polysaccharide-23 02/10/2016   Tdap 07/04/2021   Zoster Recombinat (Shingrix) 07/08/2021, 09/09/2021    Diabetes He presents for his follow-up diabetic visit. He has type 2 diabetes mellitus. Onset time: He was diagnosed at approximate age of 44years.  His disease course has been improving. Pertinent negatives for hypoglycemia include no confusion, headaches, pallor or seizures. Associated symptoms include fatigue. Pertinent negatives for diabetes include no blurred vision, no chest pain, no polydipsia, no polyphagia, no polyuria, no weakness and no weight loss. Hypoglycemia complications include nocturnal hypoglycemia and required assistance (went to ED for hypoglycemia since last visit). Symptoms are improving. Diabetic complications include nephropathy and peripheral neuropathy. Risk factors for coronary artery disease include diabetes mellitus, dyslipidemia, obesity, hypertension, male sex, tobacco exposure and sedentary lifestyle. Current diabetic treatment includes insulin injections (and Ozempic). He is compliant with treatment most of the time. His weight is decreasing steadily. He is following a generally healthy diet. When asked about meal planning, he reported none. He has had a previous visit with a dietitian. He rarely participates in exercise. His home blood glucose trend is decreasing steadily. His overall blood glucose range is 140-180 mg/dl. (He presents today with his CGM (had 2 different readers).  He has been out of sensors for about 2 weeks, says he has had trouble getting them refilled.  He was seen in the ED for hypoglycemia in between visits after starting the [place].  His POCT A1c today is 6.8%, improving drastically from 10.7%.  ) An ACE inhibitor/angiotensin II receptor blocker is not being  taken. He does not see a podiatrist.Eye exam is current.  Hyperlipidemia This is a chronic problem. The current episode started more than 1 year ago. The problem is uncontrolled. Recent lipid tests were reviewed and are variable. Exacerbating diseases include chronic renal disease, diabetes and obesity. Factors aggravating his hyperlipidemia include fatty foods. Pertinent negatives include no chest pain, myalgias or shortness of breath. Current antihyperlipidemic treatment includes statins. The current treatment provides mild improvement of lipids. Compliance problems include adherence to exercise and adherence to diet.  Risk factors for coronary artery disease include diabetes mellitus, dyslipidemia, male sex, obesity, hypertension and a sedentary lifestyle.  Hypertension This is a chronic problem. The current episode started more than 1 year ago. The problem has been resolved since onset. The problem is controlled. Pertinent negatives include no blurred vision, chest pain, headaches, neck pain, palpitations or shortness of breath. There are no associated agents to hypertension. Risk factors for coronary artery disease include diabetes mellitus, male gender, smoking/tobacco exposure, sedentary lifestyle, obesity and dyslipidemia. Past treatments include calcium channel blockers and diuretics. The current treatment provides mild improvement. There are no compliance problems.  Hypertensive end-organ damage includes kidney disease. Identifiable causes of hypertension include chronic renal disease.    Review of systems  Constitutional: + Minimally fluctuating body weight,  current Body mass index is 31.81 kg/m. , no fatigue, no subjective hyperthermia, no subjective hypothermia Eyes: no blurry vision, no xerophthalmia ENT: no sore throat, no nodules palpated in throat, no dysphagia/odynophagia, no hoarseness Cardiovascular: no chest pain, no shortness of breath, no palpitations, no leg  swelling Respiratory: no cough, no shortness of breath Gastrointestinal: no nausea/vomiting/diarrhea Musculoskeletal: no muscle/joint aches Skin: no rashes, no hyperemia Neurological: no tremors, no numbness, no tingling, no dizziness Psychiatric: no depression, no anxiety   Objective:    BP 126/79 (BP Location: Left Arm, Patient Position: Sitting, Cuff Size: Large)   Pulse 82   Ht '5\' 8"'$  (1.727 m)   Wt 209 lb 3.2 oz (94.9 kg)   BMI 31.81 kg/m   Wt Readings from Last 3 Encounters:  07/20/22 209 lb 3.2 oz (94.9 kg)  06/30/22 213 lb (96.6 kg)  06/16/22 212 lb (96.2 kg)  BP Readings from Last 3 Encounters:  07/20/22 126/79  06/30/22 129/77  06/16/22 123/75     Physical Exam- Limited  Constitutional:  Body mass index is 31.81 kg/m. , not in acute distress, normal state of mind Eyes:  EOMI, no exophthalmos Neck: Supple Cardiovascular: RRR, no murmurs, rubs, or gallops, no edema Respiratory: Adequate breathing efforts, no crackles, rales, rhonchi, or wheezing Musculoskeletal: no gross deformities, strength intact in all four extremities, no gross restriction of joint movements Skin:  no rashes, no hyperemia Neurological: no tremor with outstretched hands    CMP     Component Value Date/Time   NA 139 06/02/2022 1704   NA 140 03/04/2022 1435   K 4.2 06/02/2022 1704   CL 103 06/02/2022 1704   CO2 29 06/02/2022 1704   GLUCOSE 129 (H) 06/02/2022 1704   BUN 27 (H) 06/02/2022 1704   BUN 25 03/04/2022 1435   CREATININE 1.83 (H) 06/02/2022 1704   CREATININE 1.47 (H) 08/25/2020 0927   CALCIUM 10.0 06/02/2022 1704   PROT 7.2 03/04/2022 1435   ALBUMIN 4.2 03/04/2022 1435   AST 9 03/04/2022 1435   ALT 12 03/04/2022 1435   ALKPHOS 83 03/04/2022 1435   BILITOT 0.3 03/04/2022 1435   GFRNONAA 40 (L) 06/02/2022 1704   GFRNONAA 50 (L) 08/25/2020 0927   GFRAA 61 12/10/2020 0933   GFRAA 58 (L) 08/25/2020 0927     Diabetic Labs (most recent): Lab Results  Component Value  Date   HGBA1C 6.8 (A) 07/20/2022   HGBA1C 10.7 (A) 03/04/2022   HGBA1C 7.8 (A) 10/21/2021   MICROALBUR 150 03/25/2022   MICROALBUR 150 03/16/2021   MICROALBUR 188.1 (H) 01/07/2020     Lipid Panel     Component Value Date/Time   CHOL 95 (L) 12/28/2021 1133   TRIG 233 (H) 12/28/2021 1133   HDL 30 (L) 12/28/2021 1133   CHOLHDL 3.2 12/28/2021 1133   CHOLHDL 3.4 08/25/2020 0927   VLDL 39 (H) 05/14/2017 0831   LDLCALC 29 12/28/2021 1133   LDLCALC 55 08/25/2020 0927   LABVLDL 36 12/28/2021 1133      Lab Results  Component Value Date   TSH 1.300 03/04/2022   TSH 2.215 07/22/2021   TSH 2.100 06/10/2021   TSH 2.79 08/25/2020   TSH 1.96 08/24/2019   TSH 1.34 01/31/2018   TSH 1.61 02/10/2016   FREET4 1.14 06/10/2021   FREET4 1.0 08/25/2020     Assessment & Plan:   1) Type 2 diabetes mellitus with stage 3a chronic kidney disease, with long-term current use of insulin ([place])  - Norman Peters has currently uncontrolled symptomatic type 2 DM since 66 years of age.  He presents today with his CGM (had 2 different readers).  He has been out of sensors for about 2 weeks, says he has had trouble getting them refilled.  He was seen in the ED for hypoglycemia in between visits after starting the [place].  His POCT A1c today is 6.8%, improving drastically from 10.7%.     Recent labs reviewed.  - I had a long discussion with him about the progressive nature of diabetes and the pathology behind its complications. -his diabetes is complicated by CKD and he remains at a high risk for more acute and chronic complications which include CAD, CVA, CKD, retinopathy, and neuropathy. These are all discussed in detail with him.  - Nutritional counseling repeated at each appointment due to patients tendency to fall back in to old habits.  - The patient  admits there is a room for improvement in their diet and drink choices. -  Suggestion is made for the patient to avoid simple carbohydrates from their  diet including Cakes, Sweet Desserts / Pastries, Ice Cream, Soda (diet and regular), Sweet Tea, Candies, Chips, Cookies, Sweet Pastries, Store Bought Juices, Alcohol in Excess of 1-2 drinks a day, Artificial Sweeteners, Coffee Creamer, and "Sugar-free" Products. This will help patient to have stable blood glucose profile and potentially avoid unintended weight gain.   - I encouraged the patient to switch to unprocessed or minimally processed complex starch and increased protein intake (animal or plant source), fruits, and vegetables.   - Patient is advised to stick to a routine mealtimes to eat 3 meals a day and avoid unnecessary snacks (to snack only to correct hypoglycemia).  - I have approached him with the following individualized plan to manage his diabetes and patient agrees:   - he will continue to benefit from basal insulin.    -Based on his hypoglycemia, he is advised to lower his Lantus to 40 units SQ nightly and continue his Ozempic 0.5 mg SQ weekly.   -He is encouraged to continue monitoring blood glucose twice daily (using his CGM), before breakfast and before bed, and to call the clinic if he has readings less than 70 or greater than 200 for 3 tests in a row.  He is benefiting from this device.  - he is warned not to take insulin without proper monitoring per orders.  -He reports that he had developed unexplained cough while taking Glipizide and Metformin.  After he finished his Glipizide and Metformin, reportedly the cough has improved.  He is advised to stay off of Glipizide and Metformin for now.   - Specific targets for  A1c;  LDL, HDL,  and Triglycerides were discussed with the patient.  2) Blood Pressure /Hypertension:  His blood pressure is controlled to target.  he is advised to continue his current medications including Amlodipine 10 mg p.o. daily, Olmesartan 20 mg po daily with breakfast and HCTZ 12.5 mg po daily.  3) Lipids/Hyperlipidemia:   His most recent lipid  panel from 12/28/21 shows controlled LDL of 29 and elevated triglycerides of 233 (worsening).  He is advised to continue Crestor 5 mg po every other day at bedtime and Fenofibrate 48 mg po daily.  Side effects and precautions discussed with him.   4)  Weight/Diet:  His Body mass index is 31.81 kg/m.  -   clearly complicating his diabetes care.   he is a candidate for weight loss. I discussed with him the fact that loss of 5 - 10% of his  current body weight will have the most impact on his diabetes management.  Exercise, and detailed carbohydrates information provided  -  detailed on discharge instructions.  5) Chronic Care/Health Maintenance: -he is on a Statin medications and is encouraged to initiate and continue to follow up with Ophthalmology, Dentist, Podiatrist at least yearly or according to recommendations, and advised to stay away from smoking. I have recommended yearly flu vaccine and pneumonia vaccine at least every 5 years; moderate intensity exercise for up to 150 minutes weekly; and  sleep for at least 7 hours a day.  - he is advised to maintain close follow up with Fayrene Helper, MD for primary care needs, as well as his other providers for optimal and coordinated care.     I spent 41 minutes in the care of the patient today including review  of labs from [place], Lipids, Thyroid Function, Hematology (current and previous including abstractions from other facilities); face-to-face time discussing  his blood glucose readings/logs, discussing hypoglycemia and hyperglycemia episodes and symptoms, medications doses, his options of short and long term treatment based on the latest standards of care / guidelines;  discussion about incorporating lifestyle medicine;  and documenting the encounter. Risk reduction counseling performed per USPSTF guidelines to reduce obesity and cardiovascular risk factors.     Please refer to Patient Instructions for Blood Glucose Monitoring and  Insulin/Medications Dosing Guide"  in media tab for additional information. Please  also refer to " Patient Self Inventory" in the Media  tab for reviewed elements of pertinent patient history.  Norman Peters participated in the discussions, expressed understanding, and voiced agreement with the above plans.  All questions were answered to his satisfaction. he is encouraged to contact clinic should he have any questions or concerns prior to his return visit.   Follow up plan: - Return in about 4 months (around 11/19/2022) for Diabetes F/U with A1c in office, No previsit labs, Bring meter and logs.  Rayetta Pigg, [facility] [address] [place], [place] [address] Phone: [phone] Fax: [phone]  07/20/2022, 2:18 PM

## 2022-07-21 ENCOUNTER — Telehealth: Payer: Self-pay | Admitting: Nurse Practitioner

## 2022-07-21 NOTE — Telephone Encounter (Signed)
Pt went to pick up his Dexcom Sensors and they were going to be $180. He said to please send to [facility] instead. Thank you

## 2022-07-21 NOTE — Telephone Encounter (Signed)
He gets his Dexcom from [facility] [employer] [facility].  Dr. Moshe Cipro tried to send in refills for him to local pharmacies but couldn't get it for him.  I have reached out to [facility] for them to refill his sensors.

## 2022-08-02 DIAGNOSIS — E1165 Type 2 diabetes mellitus with hyperglycemia: Secondary | ICD-10-CM | POA: Diagnosis not present

## 2022-08-09 ENCOUNTER — Encounter (HOSPITAL_COMMUNITY): Payer: Self-pay | Admitting: Hematology

## 2022-08-16 ENCOUNTER — Inpatient Hospital Stay: Payer: Medicare HMO | Attending: Hematology

## 2022-08-16 DIAGNOSIS — G629 Polyneuropathy, unspecified: Secondary | ICD-10-CM | POA: Diagnosis not present

## 2022-08-16 DIAGNOSIS — Z87891 Personal history of nicotine dependence: Secondary | ICD-10-CM | POA: Diagnosis not present

## 2022-08-16 DIAGNOSIS — D539 Nutritional anemia, unspecified: Secondary | ICD-10-CM | POA: Insufficient documentation

## 2022-08-16 DIAGNOSIS — N189 Chronic kidney disease, unspecified: Secondary | ICD-10-CM | POA: Insufficient documentation

## 2022-08-16 DIAGNOSIS — E538 Deficiency of other specified B group vitamins: Secondary | ICD-10-CM | POA: Diagnosis not present

## 2022-08-16 LAB — CBC WITH DIFFERENTIAL/PLATELET
Abs Immature Granulocytes: 0.03 10*3/uL (ref 0.00–0.07)
Basophils Absolute: 0.1 10*3/uL (ref 0.0–0.1)
Basophils Relative: 1 %
Eosinophils Absolute: 0.5 10*3/uL (ref 0.0–0.5)
Eosinophils Relative: 5 %
HCT: 46.3 % (ref 39.0–52.0)
Hemoglobin: 15.2 g/dL (ref 13.0–17.0)
Immature Granulocytes: 0 %
Lymphocytes Relative: 31 %
Lymphs Abs: 3.4 10*3/uL (ref 0.7–4.0)
MCH: 29 pg (ref 26.0–34.0)
MCHC: 32.8 g/dL (ref 30.0–36.0)
MCV: 88.4 fL (ref 80.0–100.0)
Monocytes Absolute: 1.1 10*3/uL — ABNORMAL HIGH (ref 0.1–1.0)
Monocytes Relative: 10 %
Neutro Abs: 6 10*3/uL (ref 1.7–7.7)
Neutrophils Relative %: 53 %
Platelets: 288 10*3/uL (ref 150–400)
RBC: 5.24 MIL/uL (ref 4.22–5.81)
RDW: 13.4 % (ref 11.5–15.5)
WBC: 11.2 10*3/uL — ABNORMAL HIGH (ref 4.0–10.5)
nRBC: 0 % (ref 0.0–0.2)

## 2022-08-16 LAB — VITAMIN B12: Vitamin B-12: 762 pg/mL (ref 180–914)

## 2022-08-18 ENCOUNTER — Other Ambulatory Visit: Payer: Self-pay | Admitting: Family Medicine

## 2022-08-18 DIAGNOSIS — I10 Essential (primary) hypertension: Secondary | ICD-10-CM

## 2022-08-18 LAB — METHYLMALONIC ACID, SERUM: Methylmalonic Acid, Quantitative: 169 nmol/L (ref 0–378)

## 2022-08-23 ENCOUNTER — Other Ambulatory Visit: Payer: Self-pay

## 2022-08-23 ENCOUNTER — Inpatient Hospital Stay (HOSPITAL_BASED_OUTPATIENT_CLINIC_OR_DEPARTMENT_OTHER): Payer: Medicare HMO | Admitting: Hematology

## 2022-08-23 ENCOUNTER — Telehealth: Payer: Self-pay

## 2022-08-23 VITALS — BP 135/80 | HR 92 | Temp 97.3°F | Resp 18 | Ht 68.0 in | Wt 205.9 lb

## 2022-08-23 DIAGNOSIS — N189 Chronic kidney disease, unspecified: Secondary | ICD-10-CM | POA: Diagnosis not present

## 2022-08-23 DIAGNOSIS — D539 Nutritional anemia, unspecified: Secondary | ICD-10-CM | POA: Diagnosis not present

## 2022-08-23 DIAGNOSIS — G629 Polyneuropathy, unspecified: Secondary | ICD-10-CM | POA: Diagnosis not present

## 2022-08-23 DIAGNOSIS — Z87891 Personal history of nicotine dependence: Secondary | ICD-10-CM | POA: Diagnosis not present

## 2022-08-23 DIAGNOSIS — E538 Deficiency of other specified B group vitamins: Secondary | ICD-10-CM

## 2022-08-23 NOTE — Progress Notes (Signed)
Norman Peters, [place] [address]   CLINIC:  Medical Oncology/Hematology  PCP:  Fayrene Helper, MD [address] / [place] [address]  [phone]  REASON FOR VISIT:  Follow-up for anemia and vitamin B-12 deficiency  PRIOR THERAPY: none  CURRENT THERAPY: monthly vitamin B-12 injections  INTERVAL HISTORY:  Norman Peters, a 66 y.o. male, seen for follow-up of B12 deficiency.  He is taking B12 1 mg tablet daily.  He reports some arthritis in his knees, rated as 7 out of 10.  Chronic constipation is stable.  REVIEW OF SYSTEMS:  Review of Systems  Constitutional:  Negative for appetite change and fatigue.  Gastrointestinal:  Positive for constipation.  Neurological:  Positive for numbness.  All other systems reviewed and are negative.   PAST MEDICAL/SURGICAL HISTORY:  Past Medical History:  Diagnosis Date   Acute left-sided low back pain with left-sided sciatica 11/04/2020   Anxiety    Arthritis    Depression 2001   Diabetes mellitus 2010   GERD (gastroesophageal reflux disease)    Hyperlipidemia    Hypertension 2005   Past Surgical History:  Procedure Laterality Date   COLONOSCOPY N/A 07/07/2016   Procedure: COLONOSCOPY;  Surgeon: Rogene Houston, MD;  Location: [facility];  Service: Endoscopy;  Laterality: N/A;  1030   FINGER SURGERY Left 1974   fifth , trauma on job, had to replace/repair finger   HERNIA REPAIR  9678   x3, umbilical and bilateral hernia    SOCIAL HISTORY:  Social History   Socioeconomic History   Marital status: Married    Spouse name: Not on file   Number of children: Not on file   Years of education: Not on file   Highest education level: Not on file  Occupational History   Not on file  Tobacco Use   Smoking status: Former    Packs/day: 0.50    Years: 30.00    Total pack years: 15.00    Types: Cigarettes    Quit date: 01/20/2011    Years since quitting: 11.5   Smokeless  tobacco: Never  Vaping Use   Vaping Use: Never used  Substance and Sexual Activity   Alcohol use: No   Drug use: No   Sexual activity: Not Currently  Other Topics Concern   Not on file  Social History Narrative   Not on file   Social Determinants of Health   Financial Resource Strain: Low Risk  (04/22/2022)   Overall Financial Resource Strain (CARDIA)    Difficulty of Paying Living Expenses: Not hard at all  Food Insecurity: No Food Insecurity (04/22/2022)   Hunger Vital Sign    Worried About Running Out of Food in the Last Year: Never true    Ran Out of Food in the Last Year: Never true  Transportation Needs: No Transportation Needs (04/22/2022)   PRAPARE - Hydrologist (Medical): No    Lack of Transportation (Non-Medical): No  Physical Activity: Insufficiently Active (04/22/2022)   Exercise Vital Sign    Days of Exercise per Week: 2 days    Minutes of Exercise per Session: 20 min  Stress: No Stress Concern Present (04/22/2022)   [place]    Feeling of Stress : Not at all  Social Connections: Moderately Integrated (04/22/2022)   Social Connection and Isolation Panel [NHANES]    Frequency of Communication with  Friends and Family: More than three times a week    Frequency of Social Gatherings with Friends and Family: More than three times a week    Attends Religious Services: More than 4 times per year    Active Member of [employer] or Organizations: No    Attends Archivist Meetings: Never    Marital Status: Married  Human resources officer Violence: Not At Risk (04/22/2022)   Humiliation, Afraid, Rape, and Kick questionnaire    Fear of Current or Ex-Partner: No    Emotionally Abused: No    Physically Abused: No    Sexually Abused: No    FAMILY HISTORY:  Family History  Problem Relation Age of Onset   Stroke Mother     CURRENT MEDICATIONS:  Current Outpatient Medications   Medication Sig Dispense Refill   acetaminophen (TYLENOL) 325 MG tablet Take 650 mg by mouth every 6 (six) hours as needed.     albuterol (VENTOLIN HFA) 108 (90 Base) MCG/ACT inhaler Inhale 2 puffs into the lungs daily as needed for wheezing or shortness of breath. 1 each 1   amlodipine-olmesartan (AZOR) 10-20 MG tablet Take 1 tablet by mouth daily. 90 tablet 2   aspirin EC 81 MG tablet Take 81 mg by mouth every morning.     busPIRone (BUSPAR) 5 MG tablet Take 5 mg by mouth 2 (two) times daily.     chlorpheniramine (CHLOR-TRIMETON) 4 MG tablet Take one tablet by mouth once daily for days , then as needed, fir sinus congestion 14 tablet 0   diclofenac (VOLTAREN) 50 MG EC tablet Take 1 tablet by mouth twice daily 60 tablet 0   diclofenac Sodium (VOLTAREN) 1 % GEL APPLY 4  GRAMS TOPICALLY TWICE DAILY 200 g 0   fenofibrate (TRICOR) 48 MG tablet Take 1 tablet (48 mg total) by mouth daily. 30 tablet 5   FLUoxetine (PROZAC) 40 MG capsule TAKE 1 CAPSULE EVERY DAY 90 capsule 0   glucose blood test strip Use as instructed once daily testing dx e11.9 100 each 3   hydrochlorothiazide (HYDRODIURIL) 25 MG tablet Take 1 tablet (25 mg total) by mouth daily. 30 tablet 5   insulin glargine (LANTUS SOLOSTAR) 100 UNIT/ML Solostar Pen Inject 40 Units into the skin at bedtime. 45 mL 3   montelukast (SINGULAIR) 10 MG tablet Take 1 tablet by mouth once daily 30 tablet 0   olmesartan (BENICAR) 20 MG tablet Take 1 tablet (20 mg total) by mouth daily. 30 tablet 0   rosuvastatin (CRESTOR) 5 MG tablet TAKE 1 TABLET EVERY MONDAY, WEDNESDAY AND FRIDAY (DISCONTINUE PRAVASTATIN) 36 tablet 3   Semaglutide,0.25 or 0.5MG/DOS, (OZEMPIC, 0.25 OR 0.5 MG/DOSE,) 2 MG/3ML SOPN Inject 0.5 mg into the skin once a week. Inject 0.25 mg SQ weekly x 2 doses then increase to 0.5 mg SQ weekly thereafter if tolerated well. 6 mL 3   traMADol (ULTRAM) 50 MG tablet Take 1 tablet (50 mg total) by mouth 2 (two) times daily. 60 tablet 2   No current  facility-administered medications for this visit.    ALLERGIES:  Allergies  Allergen Reactions   Ibuprofen Rash   Lisinopril Cough   Strawberry [Berry] Hives and Swelling    TONGUE SWELLING    PHYSICAL EXAM:  Performance status (ECOG): 1 - Symptomatic but completely ambulatory  Vitals:   08/23/22 1455  BP: 135/80  Pulse: 92  Resp: 18  Temp: (!) 97.3 F (36.3 C)  SpO2: 99%   Wt Readings from Last 3 Encounters:  08/23/22 205 lb 14.4 oz (93.4 kg)  07/20/22 209 lb 3.2 oz (94.9 kg)  06/30/22 213 lb (96.6 kg)   Physical Exam Vitals reviewed.  Constitutional:      Appearance: Normal appearance. He is obese.  Cardiovascular:     Rate and Rhythm: Normal rate and regular rhythm.     Pulses: Normal pulses.     Heart sounds: Normal heart sounds.  Pulmonary:     Effort: Pulmonary effort is normal.     Breath sounds: Normal breath sounds.  Neurological:     General: No focal deficit present.     Mental Status: He is alert and oriented to person, place, and time.  Psychiatric:        Mood and Affect: Mood normal.        Behavior: Behavior normal.     LABORATORY DATA:  I have reviewed the labs as listed.     Latest Ref Rng & Units 08/16/2022    1:42 PM 06/02/2022    5:04 PM 05/06/2022    1:01 AM  CBC  WBC 4.0 - 10.5 K/uL 11.2  11.3  13.3   Hemoglobin 13.0 - 17.0 g/dL 15.2  14.7  14.3   Hematocrit 39.0 - 52.0 % 46.3  43.4  42.8   Platelets 150 - 400 K/uL 288  303  295       Latest Ref Rng & Units 06/02/2022    5:04 PM 05/06/2022    1:01 AM 03/04/2022    2:35 PM  CMP  Glucose 70 - 99 mg/dL 129  87  274   BUN 8 - 23 mg/dL '27  29  25   ' Creatinine 0.61 - 1.24 mg/dL 1.83  1.92  1.83   Sodium 135 - 145 mmol/L 139  139  140   Potassium 3.5 - 5.1 mmol/L 4.2  3.4  4.7   Chloride 98 - 111 mmol/L 103  104  101   CO2 22 - 32 mmol/L '29  25  25   ' Calcium 8.9 - 10.3 mg/dL 10.0  9.3  9.8   Total Protein 6.0 - 8.5 g/dL   7.2   Total Bilirubin 0.0 - 1.2 mg/dL   0.3   Alkaline  Phos 44 - 121 IU/L   83   AST 0 - 40 IU/L   9   ALT 0 - 44 IU/L   12       Component Value Date/Time   RBC 5.24 08/16/2022 1342   MCV 88.4 08/16/2022 1342   MCV 107 (H) 07/17/2021 1058   MCH 29.0 08/16/2022 1342   MCHC 32.8 08/16/2022 1342   RDW 13.4 08/16/2022 1342   RDW 20.7 (H) 07/17/2021 1058   LYMPHSABS 3.4 08/16/2022 1342   LYMPHSABS 3.1 07/17/2021 1058   MONOABS 1.1 (H) 08/16/2022 1342   EOSABS 0.5 08/16/2022 1342   EOSABS 0.5 (H) 07/17/2021 1058   BASOSABS 0.1 08/16/2022 1342   BASOSABS 0.0 07/17/2021 1058    DIAGNOSTIC IMAGING:  I have independently reviewed the scans and discussed with the patient. No results found.   ASSESSMENT:  Macrocytic anemia: - Severe macrocytic anemia on 07/10/2021 with hemoglobin 6.9. - Required 2 units of RBC in the last 2 weeks. - Colonoscopy on 07/07/2016 with 5 mm rectosigmoid hyperplastic polyp removed. - CTAP on 03/07/2021 with spleen size normal. - Has occasional blood on tissue when constipated, most likely from external hemorrhoids.  Denies any fevers, night sweats or weight loss. - Intrinsic factor and parietal  cell antibodies were high suggestive of pernicious anemia.   Social/family history: - He lives at home with his wife.  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  No exposure to chemicals.  He smoked 1 pack/day for 40 years.  Quit smoking in 2012. - No family history of cancers or leukemias   PLAN:  Severe macrocytic anemia secondary to vitamin B12 deficiency: - He is taking B12 1 mg tablet daily. - Labs were reviewed from 08/16/2022 which showed hemoglobin 15.2.  B12 is 762 and MMA is normal. - Continue B12 1 mg tablet daily.  RTC 1 year with repeat labs. - I have discussed with him that his white count has been slightly elevated for the past few times.  It was predominantly monocytes.  He does not have any other cytopenias.  We will closely monitor  it.  If there is any significant changes, consider bone marrow biopsy.   Peripheral neuropathy: - Numbness in the fingertips and toes has been stable.   CKD: - He has CKD since 2017.  Last creatinine is 1.83 on 06/02/2022. - Avoid nephrotoxins.  Drink more water.  Orders placed this encounter:  Orders Placed This Encounter  Procedures   CBC with Differential/Platelet   Lactate dehydrogenase   Vitamin B12      Derek Jack, MD [place] [phone]

## 2022-08-23 NOTE — Telephone Encounter (Signed)
Christiana called from [facility] faxed over a request asking patient take combo or just taking the amlodipine 10 mg. Call back # [phone]

## 2022-08-23 NOTE — Patient Instructions (Addendum)
Norman Peters  Discharge Instructions  You were seen and examined today by Dr. Delton Coombes.  Dr. Delton Coombes discussed your most recent lab work which revealed that everything looks good except you monocytes have been elevated the last few times. We will keep an eye on them with repeat labs in one year.  Follow-up as scheduled in one year.    Thank you for choosing [facility] to provide your oncology and hematology care.   To afford each patient quality time with our provider, please arrive at least 15 minutes before your scheduled appointment time. You may need to reschedule your appointment if you arrive late (10 or more minutes). Arriving late affects you and other patients whose appointments are after yours.  Also, if you miss three or more appointments without notifying the office, you may be dismissed from the clinic at the provider's discretion.    Again, thank you for choosing [facility].  Our hope is that these requests will decrease the amount of time that you wait before being seen by our physicians.   If you have a lab appointment with the [facility] please come in thru the Main Entrance and check in at the main information desk.           _____________________________________________________________  Should you have questions after your visit to [facility], please contact our office at [phone] and follow the prompts.  Our office hours are 8:00 a.m. to 4:30 p.m. Monday - Thursday and 8:00 a.m. to 2:30 p.m. Friday.  Please note that voicemails left after 4:00 p.m. may not be returned until the following business day.  We are closed weekends and all major holidays.  You do have access to a nurse 24-7, just call the main number to the clinic [phone] and do not press any options, hold on the line and a nurse will answer the phone.    For prescription refill requests, have your pharmacy  contact our office and allow 72 hours.    Masks are optional in the cancer centers. If you would like for your care team to wear a mask while they are taking care of you, please let them know. You may have one support person who is at least 66 years old accompany you for your appointments.

## 2022-08-24 ENCOUNTER — Other Ambulatory Visit: Payer: Self-pay | Admitting: Family Medicine

## 2022-08-24 ENCOUNTER — Other Ambulatory Visit: Payer: Self-pay

## 2022-08-24 MED ORDER — AMLODIPINE-OLMESARTAN 10-20 MG PO TABS: 1.0000 | ORAL_TABLET | Freq: Every day | ORAL | 2 refills | Status: DC

## 2022-08-24 NOTE — Telephone Encounter (Signed)
Sent back that he is on combo only

## 2022-09-11 ENCOUNTER — Emergency Department (HOSPITAL_COMMUNITY): Payer: Medicare HMO

## 2022-09-11 ENCOUNTER — Encounter (HOSPITAL_COMMUNITY): Payer: Self-pay

## 2022-09-11 ENCOUNTER — Emergency Department (HOSPITAL_COMMUNITY)
Admission: EM | Admit: 2022-09-11 | Discharge: 2022-09-11 | Disposition: A | Discharge status: Home / Self Care | Payer: Medicare HMO | Attending: Emergency Medicine | Admitting: Emergency Medicine

## 2022-09-11 ENCOUNTER — Other Ambulatory Visit: Payer: Self-pay

## 2022-09-11 DIAGNOSIS — Z79899 Other long term (current) drug therapy: Secondary | ICD-10-CM | POA: Insufficient documentation

## 2022-09-11 DIAGNOSIS — Z794 Long term (current) use of insulin: Secondary | ICD-10-CM | POA: Diagnosis not present

## 2022-09-11 DIAGNOSIS — X501XXA Overexertion from prolonged static or awkward postures, initial encounter: Secondary | ICD-10-CM | POA: Insufficient documentation

## 2022-09-11 DIAGNOSIS — I10 Essential (primary) hypertension: Secondary | ICD-10-CM | POA: Insufficient documentation

## 2022-09-11 DIAGNOSIS — S29011A Strain of muscle and tendon of front wall of thorax, initial encounter: Secondary | ICD-10-CM | POA: Diagnosis not present

## 2022-09-11 DIAGNOSIS — E1140 Type 2 diabetes mellitus with diabetic neuropathy, unspecified: Secondary | ICD-10-CM | POA: Diagnosis not present

## 2022-09-11 DIAGNOSIS — M5442 Lumbago with sciatica, left side: Secondary | ICD-10-CM | POA: Diagnosis not present

## 2022-09-11 DIAGNOSIS — Z7982 Long term (current) use of aspirin: Secondary | ICD-10-CM | POA: Insufficient documentation

## 2022-09-11 DIAGNOSIS — S29001A Unspecified injury of muscle and tendon of front wall of thorax, initial encounter: Secondary | ICD-10-CM | POA: Diagnosis present

## 2022-09-11 DIAGNOSIS — R109 Unspecified abdominal pain: Secondary | ICD-10-CM | POA: Diagnosis not present

## 2022-09-11 NOTE — ED Triage Notes (Signed)
Left abdomen pain. Started a week ago. Pt states pain getting worse. Pt missed a step last week going down but did not fall. Pt just lost his balance.

## 2022-09-11 NOTE — ED Provider Notes (Signed)
[facility] EMERGENCY DEPARTMENT Provider Note   CSN: [id number] Arrival date & time: 09/11/22  2140     History  Chief Complaint  Patient presents with   Abdominal Pain    Norman Peters is a 66 y.o. male.  Patient presents to the hospital complaining of left-sided abdominal/rib pain.  Patient states he missed a step when going down a staircase last Friday.  He states that he landed awkwardly, without falling, but jarred his body.  He states that since that time he has had some pain in the left lower rib cage region.  He denies shortness of breath, chest pain, abdominal pain at this time.  Past medical history significant for hypertension, depression, hyperlipidemia, arthritis, type 2 diabetes with diabetic neuropathy, GERD, anxiety, acute left-sided low back pain with left-sided sciatica  HPI     Home Medications Prior to Admission medications   Medication Sig Start Date End Date Taking? Authorizing Provider  acetaminophen (TYLENOL) 325 MG tablet Take 650 mg by mouth every 6 (six) hours as needed.    [provider]  albuterol (VENTOLIN HFA) 108 (90 Base) MCG/ACT inhaler Inhale 2 puffs into the lungs daily as needed for wheezing or shortness of breath. 01/15/22   Fayrene Helper, MD  amlodipine-olmesartan (AZOR) 10-20 MG tablet Take 1 tablet by mouth daily. 08/24/22   Fayrene Helper, MD  aspirin EC 81 MG tablet Take 81 mg by mouth every morning. 03/08/16   Fayrene Helper, MD  busPIRone (BUSPAR) 5 MG tablet Take 5 mg by mouth 2 (two) times daily.    [provider]  chlorpheniramine (CHLOR-TRIMETON) 4 MG tablet Take one tablet by mouth once daily for days , then as needed, fir sinus congestion 06/28/22   Fayrene Helper, MD  diclofenac (VOLTAREN) 50 MG EC tablet Take 1 tablet by mouth twice daily 03/16/22   Carole Civil, MD  diclofenac Sodium (VOLTAREN) 1 % GEL APPLY 4  GRAMS TOPICALLY TWICE DAILY 07/14/22   Paseda, Dewaine Conger, FNP  fenofibrate  (TRICOR) 48 MG tablet Take 1 tablet (48 mg total) by mouth daily. 01/15/22   Fayrene Helper, MD  FLUoxetine (PROZAC) 40 MG capsule TAKE 1 CAPSULE EVERY DAY 06/10/22   Fayrene Helper, MD  glucose blood test strip Use as instructed once daily testing dx e11.9 09/04/21   Fayrene Helper, MD  hydrochlorothiazide (HYDRODIURIL) 25 MG tablet Take 1 tablet (25 mg total) by mouth daily. 07/20/22   Brita Romp, NP  insulin glargine (LANTUS SOLOSTAR) 100 UNIT/ML Solostar Pen Inject 40 Units into the skin at bedtime. 07/20/22   Brita Romp, NP  montelukast (SINGULAIR) 10 MG tablet Take 1 tablet by mouth once daily 02/26/22   Paseda, Lillie Columbia R, FNP  rosuvastatin (CRESTOR) 5 MG tablet TAKE 1 TABLET EVERY MONDAY, WEDNESDAY AND FRIDAY (DISCONTINUE PRAVASTATIN) 05/14/22   Fayrene Helper, MD  Semaglutide,0.25 or 0.'5MG'$ /DOS, (OZEMPIC, 0.25 OR 0.5 MG/DOSE,) 2 MG/3ML SOPN Inject 0.5 mg into the skin once a week. Inject 0.25 mg SQ weekly x 2 doses then increase to 0.5 mg SQ weekly thereafter if tolerated well. 04/14/22   Brita Romp, NP  traMADol (ULTRAM) 50 MG tablet Take 1 tablet (50 mg total) by mouth 2 (two) times daily. 06/30/22 09/28/22  Fayrene Helper, MD      Allergies    Ibuprofen, Lisinopril, and Strawberry [berry]    Review of Systems   Review of Systems  Respiratory:  Negative for shortness  of breath.   Cardiovascular:  Negative for chest pain.  Gastrointestinal:  Negative for abdominal pain.  Musculoskeletal:        Left-sided rib cage pain    Physical Exam Updated Vital Signs BP (!) 146/80 (BP Location: Right Arm)   Pulse 90   Temp 98.6 F (37 C) (Oral)   Resp 18   Ht '5\' 8"'$  (1.727 m)   Wt 91.2 kg   SpO2 98%   BMI 30.56 kg/m  Physical Exam Vitals and nursing note reviewed.  Constitutional:      General: He is not in acute distress.    Appearance: He is well-developed.  HENT:     Head: Normocephalic and atraumatic.  Eyes:     Conjunctiva/sclera:  Conjunctivae normal.  Cardiovascular:     Rate and Rhythm: Normal rate and regular rhythm.     Heart sounds: No murmur heard. Pulmonary:     Effort: Pulmonary effort is normal. No respiratory distress.     Breath sounds: Normal breath sounds.  Chest:     Chest wall: Tenderness present.    Abdominal:     Palpations: Abdomen is soft.     Tenderness: There is no abdominal tenderness.  Musculoskeletal:        General: No swelling.     Cervical back: Neck supple.  Skin:    General: Skin is warm and dry.     Capillary Refill: Capillary refill takes less than 2 seconds.  Neurological:     Mental Status: He is alert.  Psychiatric:        Mood and Affect: Mood normal.     ED Results / Procedures / Treatments   Labs (all labs ordered are listed, but only abnormal results are displayed) Labs Reviewed - No data to display  EKG None  Radiology DG Ribs Unilateral W/Chest Left  Result Date: 09/11/2022 CLINICAL DATA:  Left flank pain EXAM: LEFT RIBS AND CHEST - 3+ VIEW COMPARISON:  Chest radiograph dated 04/13/2021 FINDINGS: Lungs are clear.  No pleural effusion or pneumothorax. The heart is normal in size. No displaced left rib fracture is seen. IMPRESSION: Negative. Electronically Signed   By: Julian Hy M.D.   On: 09/11/2022 22:25    Procedures Procedures    Medications Ordered in ED Medications - No data to display  ED Course/ Medical Decision Making/ A&P                           Medical Decision Making Amount and/or Complexity of Data Reviewed Radiology: ordered.   This patient presents to the ED for concern of left-sided rib cage pain, this involves an extensive number of treatment options, and is a complaint that carries with it a high risk of complications and morbidity.  The differential diagnosis includes, dislocation, intercostal muscle strain, others   Co morbidities that complicate the patient evaluation  Arthritis, anxiety, type 2  diabetes    Imaging Studies ordered:  I ordered imaging studies including plain films of the left-sided ribs I independently visualized and interpreted imaging which showed no displaced left-sided rib fracture I agree with the radiologist interpretation   Test / Admission - Considered:  Based on the patient's history and the location of his pain, I think it is likely that he is strained an intercostal muscle.  He complains of no bloody stools or abdominal pain to suggest diverticulitis.  He is denying shortness of breath or chest pain to suggest  ACS, pneumonia, or other intrathoracic etiology.  Plan to recommend patient continue his Voltaren prescription, with both oral Voltaren and gel which can also be applied over the affected area.  Patient to follow-up with primary care provider        Final Clinical Impression(s) / ED Diagnoses Final diagnoses:  Intercostal muscle strain, initial encounter    Rx / DC Orders ED Discharge Orders     None         Ronny Bacon 09/11/22 2241    Milton Ferguson, MD 09/13/22 1144

## 2022-09-11 NOTE — ED Notes (Signed)
ED Provider at bedside. 

## 2022-09-11 NOTE — ED Notes (Signed)
Pt returned from X Ray.

## 2022-09-11 NOTE — Discharge Instructions (Signed)
You were diagnosed today with a muscle strain in the left chest.  Please continue to take your prescribed Voltaren tablets.  You may also use Voltaren gel over the affected area.  You may also continue your tramadol.  If you develop any life-threatening conditions please return to the emergency department

## 2022-09-14 ENCOUNTER — Other Ambulatory Visit: Payer: Self-pay | Admitting: Family Medicine

## 2022-09-15 ENCOUNTER — Other Ambulatory Visit (HOSPITAL_COMMUNITY): Payer: Self-pay

## 2022-09-15 ENCOUNTER — Ambulatory Visit (INDEPENDENT_AMBULATORY_CARE_PROVIDER_SITE_OTHER): Payer: Medicare HMO | Admitting: Family Medicine

## 2022-09-15 ENCOUNTER — Encounter: Payer: Self-pay | Admitting: Family Medicine

## 2022-09-15 VITALS — BP 123/80 | HR 93 | Ht 68.0 in | Wt 203.0 lb

## 2022-09-15 DIAGNOSIS — E1122 Type 2 diabetes mellitus with diabetic chronic kidney disease: Secondary | ICD-10-CM

## 2022-09-15 DIAGNOSIS — I10 Essential (primary) hypertension: Secondary | ICD-10-CM | POA: Diagnosis not present

## 2022-09-15 DIAGNOSIS — Z23 Encounter for immunization: Secondary | ICD-10-CM | POA: Diagnosis not present

## 2022-09-15 DIAGNOSIS — G8929 Other chronic pain: Secondary | ICD-10-CM

## 2022-09-15 DIAGNOSIS — M2550 Pain in unspecified joint: Secondary | ICD-10-CM

## 2022-09-15 DIAGNOSIS — E669 Obesity, unspecified: Secondary | ICD-10-CM

## 2022-09-15 DIAGNOSIS — N1832 Chronic kidney disease, stage 3b: Secondary | ICD-10-CM | POA: Diagnosis not present

## 2022-09-15 DIAGNOSIS — N1831 Chronic kidney disease, stage 3a: Secondary | ICD-10-CM | POA: Diagnosis not present

## 2022-09-15 DIAGNOSIS — Z794 Long term (current) use of insulin: Secondary | ICD-10-CM

## 2022-09-15 DIAGNOSIS — E66811 Obesity, class 1: Secondary | ICD-10-CM

## 2022-09-15 DIAGNOSIS — E782 Mixed hyperlipidemia: Secondary | ICD-10-CM

## 2022-09-15 MED ORDER — BUSPIRONE HCL 5 MG PO TABS
5.0000 mg | ORAL_TABLET | Freq: Two times a day (BID) | ORAL | 1 refills | Status: DC
Start: 2022-09-15 — End: 2022-12-03
  Filled 2022-09-15: qty 60, 30d supply, fill #0
  Filled 2022-10-29: qty 60, 30d supply, fill #1

## 2022-09-15 NOTE — Patient Instructions (Signed)
Please reschedule annual exam to first week in January, call if you need me soner  Congrats on excellent blood pressure and blood sugar   Labs today already ordered  Flu vaccine today  It is important that you exercise regularly at least 30 minutes 5 times a week. If you develop chest pain, have severe difficulty breathing, or feel very tired, stop exercising immediately and seek medical attention   Thanks for choosing [facility], we consider it a privelige to serve you.

## 2022-09-16 ENCOUNTER — Encounter: Payer: Self-pay | Admitting: Family Medicine

## 2022-09-16 LAB — CMP14+EGFR
ALT: 10 IU/L (ref 0–44)
AST: 12 IU/L (ref 0–40)
Albumin/Globulin Ratio: 1.4 (ref 1.2–2.2)
Albumin: 4.5 g/dL (ref 3.9–4.9)
Alkaline Phosphatase: 114 IU/L (ref 44–121)
BUN/Creatinine Ratio: 10 (ref 10–24)
BUN: 20 mg/dL (ref 8–27)
Bilirubin Total: 0.5 mg/dL (ref 0.0–1.2)
CO2: 25 mmol/L (ref 20–29)
Calcium: 10.5 mg/dL — ABNORMAL HIGH (ref 8.6–10.2)
Chloride: 98 mmol/L (ref 96–106)
Creatinine, Ser: 2.08 mg/dL — ABNORMAL HIGH (ref 0.76–1.27)
Globulin, Total: 3.3 g/dL (ref 1.5–4.5)
Glucose: 109 mg/dL — ABNORMAL HIGH (ref 70–99)
Potassium: 4.9 mmol/L (ref 3.5–5.2)
Sodium: 138 mmol/L (ref 134–144)
Total Protein: 7.8 g/dL (ref 6.0–8.5)
eGFR: 34 mL/min/{1.73_m2} — ABNORMAL LOW (ref >59)

## 2022-09-16 LAB — LIPID PANEL
Chol/HDL Ratio: 3.2 ratio (ref 0.0–5.0)
Cholesterol, Total: 108 mg/dL (ref 100–199)
HDL: 34 mg/dL — ABNORMAL LOW (ref >39)
LDL Chol Calc (NIH): 50 mg/dL (ref 0–99)
Triglycerides: 133 mg/dL (ref 0–149)
VLDL Cholesterol Cal: 24 mg/dL (ref 5–40)

## 2022-09-16 LAB — RHEUMATOID FACTOR: Rheumatoid fact SerPl-aCnc: 82.8 IU/mL — ABNORMAL HIGH (ref <14.0)

## 2022-09-16 MED ORDER — TRAMADOL HCL 50 MG PO TABS: 50.0000 mg | ORAL_TABLET | Freq: Two times a day (BID) | ORAL | 3 refills | Status: AC

## 2022-09-16 NOTE — Assessment & Plan Note (Signed)
The patient's Controlled Substance registry is reviewed and compliance confirmed. Adequacy of  Pain control and level of function is assessed. Medication dosing is adjusted as deemed appropriate. Twelve weeks of medication is prescribed , with a follow up appointment between 11 to 12 weeks .  

## 2022-09-16 NOTE — Assessment & Plan Note (Signed)
Controlled, no change in medication DASH diet and commitment to daily physical activity for a minimum of 30 minutes discussed and encouraged, as a part of hypertension management. The importance of attaining a healthy weight is also discussed.     09/15/2022    1:26 PM 09/11/2022   10:44 PM 09/11/2022    9:46 PM 09/11/2022    9:45 PM 08/23/2022    2:55 PM 07/20/2022   12:58 PM 06/30/2022    1:00 PM  BP/Weight  Systolic BP 472 072 182  883 374 451  Diastolic BP 80 78 80  80 79 77  Wt. (Lbs) 203   201 205.9 209.2 213  BMI 30.87 kg/m2   30.56 kg/m2 31.31 kg/m2 31.81 kg/m2 32.39 kg/m2

## 2022-09-16 NOTE — Assessment & Plan Note (Signed)
Marked improvement , currently vey well controlled Norman Peters is reminded of the importance of commitment to daily physical activity for 30 minutes or more, as able and the need to limit carbohydrate intake to 30 to 60 grams per meal to help with blood sugar control.   The need to take medication as prescribed, test blood sugar as directed, and to call between visits if there is a concern that blood sugar is uncontrolled is also discussed.   [name] is reminded of the importance of daily foot exam, annual eye examination, and good blood sugar, blood pressure and cholesterol control.     Latest Ref Rng & Units 09/15/2022    2:27 PM 07/20/2022    1:15 PM 06/02/2022    5:04 PM 05/06/2022    1:01 AM 03/25/2022    2:25 PM  Diabetic Labs  HbA1c 4.0 - 5.6 %  6.8      Microalbumin mg/L     150   Micro/Creat Ratio      30-300   Chol 100 - 199 mg/dL 108       HDL >39 mg/dL 34       Calc LDL 0 - 99 mg/dL 50       Triglycerides 0 - 149 mg/dL 133       Creatinine 0.76 - 1.27 mg/dL 2.08   1.83  1.92        09/15/2022    1:26 PM 09/11/2022   10:44 PM 09/11/2022    9:46 PM 09/11/2022    9:45 PM 08/23/2022    2:55 PM 07/20/2022   12:58 PM 06/30/2022    1:00 PM  BP/Weight  Systolic BP 626 948 546  270 350 093  Diastolic BP 80 78 80  80 79 77  Wt. (Lbs) 203   201 205.9 209.2 213  BMI 30.87 kg/m2   30.56 kg/m2 31.31 kg/m2 31.81 kg/m2 32.39 kg/m2      Latest Ref Rng & Units 01/15/2022    1:00 PM 07/01/2021   12:00 AM  Foot/eye exam completion dates  Eye Exam No Retinopathy  Retinopathy      Foot Form Completion  Done      This result is from an external source.

## 2022-09-16 NOTE — Assessment & Plan Note (Addendum)
Deterioration in kidney function refer to Nephrology,  EGFR is 4 in 09/2022

## 2022-09-16 NOTE — Progress Notes (Signed)
Norman Peters     MRN: [id number]      DOB: 23-Sep-1956   HPI Norman Peters is here for follow up and re-evaluation of chronic medical conditions, medication management and review of any available recent lab and radiology data.  Preventive health is updated, specifically  Cancer screening and Immunization.   Questions or concerns regarding consultations or procedures which the PT has had in the interim are  addressed. The PT denies any adverse reactions to current medications since the last visit.  There are no new concerns.  There are no specific complaints  Blood sugar greatly improved Denies polyuria, polydipsia, blurred vision , or hypoglycemic episodes. Feels better overall  ROS Denies recent fever or chills. Denies sinus pressure, nasal congestion, ear pain or sore throat. Denies chest congestion, productive cough or wheezing. Denies chest pains, palpitations and leg swelling Denies abdominal pain, nausea, vomiting,diarrhea or constipation.   Denies dysuria, frequency, hesitancy or incontinence. Chronic  joint pain, swelling and limitation in mobility. Denies headaches, seizures, numbness, or tingling. Denies depression, anxiety or insomnia. Denies skin break down or rash.   PE  BP 123/80 (BP Location: Right Arm, Patient Position: Sitting, Cuff Size: Large)   Pulse 93   Ht _0  (1.727 m)   Wt 203 lb (92.1 kg)   SpO2 94%   BMI 30.87 kg/m   Patient alert and oriented and in no cardiopulmonary distress.  HEENT: No facial asymmetry, EOMI,     Neck supple .  Chest: Clear to auscultation bilaterally.  CVS: S1, S2 no murmurs, no S3.Regular rate.  ABD: Soft non tender.   Ext: No edema  MS: decreased  ROM spine, shoulders, hips and knees.  Skin: Intact, no ulcerations or rash noted.  Psych: Good eye contact, normal affect. Memory intact not anxious or depressed appearing.  CNS: CN 2-12 intact, power,  normal throughout.no focal deficits noted.   Assessment &  Plan  Chronic kidney disease Deterioration in kidney function refer to Nephrology,  EGFR is 4 in 09/2022  Pain in joint involving multiple sites Markedly elevated RF in 09/2022, has been referred to Rheumatology  Essential hypertension, benign Controlled, no change in medication DASH diet and commitment to daily physical activity for a minimum of 30 minutes discussed and encouraged, as a part of hypertension management. The importance of attaining a healthy weight is also discussed.     09/15/2022    1:26 PM 09/11/2022   10:44 PM 09/11/2022    9:46 PM 09/11/2022    9:45 PM 08/23/2022    2:55 PM 07/20/2022   12:58 PM 06/30/2022    1:00 PM  BP/Weight  Systolic BP 891 694 503  888 280 034  Diastolic BP 80 78 80  80 79 77  Wt. (Lbs) 203   201 205.9 209.2 213  BMI 30.87 kg/m2   30.56 kg/m2 31.31 kg/m2 31.81 kg/m2 32.39 kg/m2        Obesity (BMI 30.0-34.9)  Patient re-educated about  the importance of commitment to a  minimum of 150 minutes of exercise per week as able.  The importance of healthy food choices with portion control discussed, as well as eating regularly and within a 12 hour window most days. The need to choose "clean , [place]" food 50 to 75% of the time is discussed, as well as to make water the primary drink and set a goal of 64 ounces water daily.       09/15/2022    1:26 PM 09/11/2022  9:45 PM 08/23/2022    2:55 PM  Weight /BMI  Weight 203 lb 201 lb 205 lb 14.4 oz  Height _0  (1.727 m) _1  (1.727 m) _2  (1.727 m)  BMI 30.87 kg/m2 30.56 kg/m2 31.31 kg/m2      Mixed hyperlipidemia Hyperlipidemia:Low fat diet discussed and encouraged.   Lipid Panel  Lab Results  Component Value Date   CHOL 108 09/15/2022   HDL 34 (L) 09/15/2022   LDLCALC 50 09/15/2022   TRIG 133 09/15/2022   CHOLHDL 3.2 09/15/2022     Controlled, no change in medication   Encounter for chronic pain management The patient's Controlled Substance registry is reviewed and  compliance confirmed. Adequacy of  Pain control and level of function is assessed. Medication dosing is adjusted as deemed appropriate. Twelve weeks of medication is prescribed , with a follow up appointment between 11 to 12 weeks .   type 2 dia with ckd Marked improvement , currently vey well controlled Norman Peters is reminded of the importance of commitment to daily physical activity for 30 minutes or more, as able and the need to limit carbohydrate intake to 30 to 60 grams per meal to help with blood sugar control.   The need to take medication as prescribed, test blood sugar as directed, and to call between visits if there is a concern that blood sugar is uncontrolled is also discussed.   Norman Peters is reminded of the importance of daily foot exam, annual eye examination, and good blood sugar, blood pressure and cholesterol control.     Latest Ref Rng & Units 09/15/2022    2:27 PM 07/20/2022    1:15 PM 06/02/2022    5:04 PM 05/06/2022    1:01 AM 03/25/2022    2:25 PM  Diabetic Labs  HbA1c 4.0 - 5.6 %  6.8      Microalbumin mg/L     150   Micro/Creat Ratio      30-300   Chol 100 - 199 mg/dL 108       HDL >39 mg/dL 34       Calc LDL 0 - 99 mg/dL 50       Triglycerides 0 - 149 mg/dL 133       Creatinine 0.76 - 1.27 mg/dL 2.08   1.83  1.92        09/15/2022    1:26 PM 09/11/2022   10:44 PM 09/11/2022    9:46 PM 09/11/2022    9:45 PM 08/23/2022    2:55 PM 07/20/2022   12:58 PM 06/30/2022    1:00 PM  BP/Weight  Systolic BP 409 811 914  782 956 213  Diastolic BP 80 78 80  80 79 77  Wt. (Lbs) 203   201 205.9 209.2 213  BMI 30.87 kg/m2   30.56 kg/m2 31.31 kg/m2 31.81 kg/m2 32.39 kg/m2      Latest Ref Rng & Units 01/15/2022    1:00 PM 07/01/2021   12:00 AM  Foot/eye exam completion dates  Eye Exam No Retinopathy  Retinopathy      Foot Form Completion  Done      This result is from an external source.

## 2022-09-16 NOTE — Assessment & Plan Note (Signed)
Markedly elevated RF in 09/2022, has been referred to Rheumatology

## 2022-09-16 NOTE — Assessment & Plan Note (Signed)
  Patient re-educated about  the importance of commitment to a  minimum of 150 minutes of exercise per week as able.  The importance of healthy food choices with portion control discussed, as well as eating regularly and within a 12 hour window most days. The need to choose "clean , [place]" food 50 to 75% of the time is discussed, as well as to make water the primary drink and set a goal of 64 ounces water daily.       09/15/2022    1:26 PM 09/11/2022    9:45 PM 08/23/2022    2:55 PM  Weight /BMI  Weight 203 lb 201 lb 205 lb 14.4 oz  Height '5\' 8"'$  (1.727 m) '5\' 8"'$  (1.727 m) '5\' 8"'$  (1.727 m)  BMI 30.87 kg/m2 30.56 kg/m2 31.31 kg/m2

## 2022-09-16 NOTE — Assessment & Plan Note (Signed)
Hyperlipidemia:Low fat diet discussed and encouraged.   Lipid Panel  Lab Results  Component Value Date   CHOL 108 09/15/2022   HDL 34 (L) 09/15/2022   LDLCALC 50 09/15/2022   TRIG 133 09/15/2022   CHOLHDL 3.2 09/15/2022     Controlled, no change in medication

## 2022-09-20 ENCOUNTER — Telehealth: Payer: Self-pay | Admitting: Family Medicine

## 2022-09-20 NOTE — Telephone Encounter (Signed)
Spoke with pt

## 2022-09-20 NOTE — Telephone Encounter (Signed)
Return call

## 2022-10-22 DIAGNOSIS — Z79899 Other long term (current) drug therapy: Secondary | ICD-10-CM | POA: Insufficient documentation

## 2022-10-22 DIAGNOSIS — M79602 Pain in left arm: Secondary | ICD-10-CM | POA: Diagnosis present

## 2022-10-22 DIAGNOSIS — M25532 Pain in left wrist: Secondary | ICD-10-CM | POA: Insufficient documentation

## 2022-10-22 DIAGNOSIS — M79622 Pain in left upper arm: Secondary | ICD-10-CM | POA: Insufficient documentation

## 2022-10-22 DIAGNOSIS — M79632 Pain in left forearm: Secondary | ICD-10-CM | POA: Insufficient documentation

## 2022-10-22 DIAGNOSIS — M791 Myalgia, unspecified site: Secondary | ICD-10-CM | POA: Diagnosis not present

## 2022-10-23 ENCOUNTER — Other Ambulatory Visit: Payer: Self-pay

## 2022-10-23 ENCOUNTER — Emergency Department (HOSPITAL_COMMUNITY)
Admission: EM | Admit: 2022-10-23 | Discharge: 2022-10-23 | Disposition: A | Discharge status: Home / Self Care | Payer: Medicare HMO | Attending: Emergency Medicine | Admitting: Emergency Medicine

## 2022-10-23 ENCOUNTER — Encounter (HOSPITAL_COMMUNITY): Payer: Self-pay

## 2022-10-23 DIAGNOSIS — M25532 Pain in left wrist: Secondary | ICD-10-CM | POA: Diagnosis not present

## 2022-10-23 DIAGNOSIS — M7918 Myalgia, other site: Secondary | ICD-10-CM

## 2022-10-23 LAB — CBC
HCT: 45 % (ref 39.0–52.0)
Hemoglobin: 14.7 g/dL (ref 13.0–17.0)
MCH: 28.9 pg (ref 26.0–34.0)
MCHC: 32.7 g/dL (ref 30.0–36.0)
MCV: 88.6 fL (ref 80.0–100.0)
Platelets: 292 10*3/uL (ref 150–400)
RBC: 5.08 MIL/uL (ref 4.22–5.81)
RDW: 13.9 % (ref 11.5–15.5)
WBC: 12.6 10*3/uL — ABNORMAL HIGH (ref 4.0–10.5)
nRBC: 0 % (ref 0.0–0.2)

## 2022-10-23 LAB — BASIC METABOLIC PANEL
Anion gap: 7 (ref 5–15)
BUN: 22 mg/dL (ref 8–23)
CO2: 27 mmol/L (ref 22–32)
Calcium: 9.4 mg/dL (ref 8.9–10.3)
Chloride: 101 mmol/L (ref 98–111)
Creatinine, Ser: 1.83 mg/dL — ABNORMAL HIGH (ref 0.61–1.24)
GFR, Estimated: 40 mL/min — ABNORMAL LOW (ref >60)
Glucose, Bld: 113 mg/dL — ABNORMAL HIGH (ref 70–99)
Potassium: 4.1 mmol/L (ref 3.5–5.1)
Sodium: 135 mmol/L (ref 135–145)

## 2022-10-23 LAB — TROPONIN I (HIGH SENSITIVITY): Troponin I (High Sensitivity): 4 ng/L (ref <18)

## 2022-10-23 NOTE — ED Provider Notes (Signed)
[facility] EMERGENCY DEPARTMENT Provider Note   CSN: [id number] Arrival date & time: 10/22/22  2351     History  Chief Complaint  Patient presents with   Arm Pain    Kiyan Burmester is a 66 y.o. male.  Patient presents to the ER for evaluation of intermittent left arm pain.  He reports that he has been noticing sharp pains in the area of the left wrist, forearm and bicep area that are occurring today.  Symptoms will only last for a few seconds and then resolved.  He has noticed spasming of the muscles in the forearm when this is present.  No associated chest pain.  No shortness of breath.  Reports that the symptoms have resolved.       Home Medications Prior to Admission medications   Medication Sig Start Date End Date Taking? Authorizing Provider  acetaminophen (TYLENOL) 325 MG tablet Take 650 mg by mouth every 6 (six) hours as needed.    [provider]  albuterol (VENTOLIN HFA) 108 (90 Base) MCG/ACT inhaler Inhale 2 puffs into the lungs daily as needed for wheezing or shortness of breath. 01/15/22   Fayrene Helper, MD  amlodipine-olmesartan (AZOR) 10-20 MG tablet Take 1 tablet by mouth daily. 08/24/22   Fayrene Helper, MD  aspirin EC 81 MG tablet Take 81 mg by mouth every morning. 03/08/16   Fayrene Helper, MD  busPIRone (BUSPAR) 5 MG tablet Take 1 tablet (5 mg total) by mouth 2 (two) times daily. 09/15/22   Fayrene Helper, MD  diclofenac Sodium (VOLTAREN) 1 % GEL APPLY 4  GRAMS TOPICALLY TWICE DAILY 07/14/22   Paseda, Dewaine Conger, FNP  fenofibrate (TRICOR) 48 MG tablet Take 1 tablet (48 mg total) by mouth daily. 01/15/22   Fayrene Helper, MD  FLUoxetine (PROZAC) 40 MG capsule TAKE 1 CAPSULE EVERY DAY 06/10/22   Fayrene Helper, MD  glucose blood test strip Use as instructed once daily testing dx e11.9 09/04/21   Fayrene Helper, MD  hydrochlorothiazide (HYDRODIURIL) 25 MG tablet Take 1 tablet (25 mg total) by mouth daily. 07/20/22   Brita Romp, NP  insulin glargine (LANTUS SOLOSTAR) 100 UNIT/ML Solostar Pen Inject 40 Units into the skin at bedtime. 07/20/22   Brita Romp, NP  montelukast (SINGULAIR) 10 MG tablet Take 1 tablet by mouth once daily 02/26/22   Paseda, Lillie Columbia R, FNP  rosuvastatin (CRESTOR) 5 MG tablet TAKE 1 TABLET EVERY MONDAY, WEDNESDAY AND FRIDAY (DISCONTINUE PRAVASTATIN) 05/14/22   Fayrene Helper, MD  Semaglutide,0.25 or 0.'5MG'$ /DOS, (OZEMPIC, 0.25 OR 0.5 MG/DOSE,) 2 MG/3ML SOPN Inject 0.5 mg into the skin once a week. Inject 0.25 mg SQ weekly x 2 doses then increase to 0.5 mg SQ weekly thereafter if tolerated well. 04/14/22   Brita Romp, NP  traMADol (ULTRAM) 50 MG tablet Take 1 tablet (50 mg total) by mouth 2 (two) times daily. 09/16/22 01/14/23  Fayrene Helper, MD      Allergies    Ibuprofen, Lisinopril, and Strawberry [berry]    Review of Systems   Review of Systems  Physical Exam Updated Vital Signs BP 121/73 (BP Location: Right Arm)   Pulse 87   Temp 98.4 F (36.9 C) (Oral)   Resp 18   Ht '5\' 8"'$  (1.727 m)   Wt 92 kg   SpO2 100%   BMI 30.84 kg/m  Physical Exam Vitals and nursing note reviewed.  Constitutional:      General: He  is not in acute distress.    Appearance: He is well-developed.  HENT:     Head: Normocephalic and atraumatic.     Mouth/Throat:     Mouth: Mucous membranes are moist.  Eyes:     General: Vision grossly intact. Gaze aligned appropriately.     Extraocular Movements: Extraocular movements intact.     Conjunctiva/sclera: Conjunctivae normal.  Cardiovascular:     Rate and Rhythm: Normal rate and regular rhythm.     Pulses: Normal pulses.     Heart sounds: Normal heart sounds, S1 normal and S2 normal. No murmur heard.    No friction rub. No gallop.  Pulmonary:     Effort: Pulmonary effort is normal. No respiratory distress.     Breath sounds: Normal breath sounds.  Abdominal:     Palpations: Abdomen is soft.     Tenderness: There is no  abdominal tenderness. There is no guarding or rebound.     Hernia: No hernia is present.  Musculoskeletal:        General: No swelling.     Cervical back: Full passive range of motion without pain, normal range of motion and neck supple. No pain with movement, spinous process tenderness or muscular tenderness. Normal range of motion.     Right lower leg: No edema.     Left lower leg: No edema.  Skin:    General: Skin is warm and dry.     Capillary Refill: Capillary refill takes less than 2 seconds.     Findings: No ecchymosis, erythema, lesion or wound.  Neurological:     Mental Status: He is alert and oriented to person, place, and time.     GCS: GCS eye subscore is 4. GCS verbal subscore is 5. GCS motor subscore is 6.     Cranial Nerves: Cranial nerves 2-12 are intact.     Sensory: Sensation is intact.     Motor: Motor function is intact. No weakness or abnormal muscle tone.     Coordination: Coordination is intact.  Psychiatric:        Mood and Affect: Mood normal.        Speech: Speech normal.        Behavior: Behavior normal.     ED Results / Procedures / Treatments   Labs (all labs ordered are listed, but only abnormal results are displayed) Labs Reviewed  CBC - Abnormal; Notable for the following components:      Result Value   WBC 12.6 (*)    All other components within normal limits  BASIC METABOLIC PANEL - Abnormal; Notable for the following components:   Glucose, Bld 113 (*)    Creatinine, Ser 1.83 (*)    GFR, Estimated 40 (*)    All other components within normal limits  TROPONIN I (HIGH SENSITIVITY)    EKG EKG Interpretation  Date/Time:  Saturday October 23 2022 02:50:42 EST Ventricular Rate:  82 PR Interval:  146 QRS Duration: 85 QT Interval:  385 QTC Calculation: 450 R Axis:   43 Text Interpretation: Sinus rhythm Confirmed by Orpah Greek [phone]) on 10/23/2022 3:00:54 AM  Radiology No results found.  Procedures Procedures     Medications Ordered in ED Medications - No data to display  ED Course/ Medical Decision Making/ A&P                           Medical Decision Making Amount and/or Complexity of Data Reviewed  Labs: ordered.   Dents to the emergency department for evaluation of left arm pain and spasms of the muscles in the left forearm.  He is unaware of any direct injury but did move some furniture earlier in the week.  Patient reports that the pain will come and go.  It usually only last for a few seconds and then resolves.  He has not identified anything that causes it.  At time of my evaluation it is resolved.  Patient has symptoms that seem very low likelihood to be cardiac in nature.  EKG unremarkable.  Troponin negative.  Lab work unremarkable, no signs of dehydration or electrolyte abnormality.  Feels well, symptoms are resolved and his workup is reassuring, he does not require hospitalization or further ER evaluation.  Discharge and follow-up with primary care.        Final Clinical Impression(s) / ED Diagnoses Final diagnoses:  Musculoskeletal pain    Rx / DC Orders ED Discharge Orders     None         Kellsie Grindle, Gwenyth Allegra, MD 10/23/22 [phone]

## 2022-10-23 NOTE — ED Triage Notes (Signed)
Pt arrived via POV c/o left arm muscle spasms and pain. Pt denies injury or any heavy lifting. Pt also endorses left leg numbness while lying on his side for too long.

## 2022-10-24 ENCOUNTER — Other Ambulatory Visit: Payer: Self-pay | Admitting: Family Medicine

## 2022-10-27 ENCOUNTER — Telehealth: Payer: Self-pay

## 2022-10-27 ENCOUNTER — Encounter: Payer: Medicare HMO | Admitting: Family Medicine

## 2022-10-27 NOTE — Telephone Encounter (Signed)
        Patient  visited [place] on 12/16     Telephone encounter attempt :  1st  A HIPAA compliant voice message was left requesting a return call.  Instructed patient to call back   [place], [facility]  [phone] [address], [place], [place] [address] Phone: [phone] Email: [email or web address]

## 2022-10-28 ENCOUNTER — Telehealth: Payer: Self-pay

## 2022-10-28 NOTE — Telephone Encounter (Signed)
        Patient  visited [place] on 12/16    Telephone encounter attempt :  2nd  A HIPAA compliant voice message was left requesting a return call.  Instructed patient to call back .    [place], [facility]  [phone] [address], [place], [place] [address] Phone: [phone] Email: [email or web address]

## 2022-10-31 ENCOUNTER — Other Ambulatory Visit: Payer: Self-pay | Admitting: Family Medicine

## 2022-11-11 ENCOUNTER — Ambulatory Visit (INDEPENDENT_AMBULATORY_CARE_PROVIDER_SITE_OTHER): Payer: Medicare HMO | Admitting: Family Medicine

## 2022-11-11 ENCOUNTER — Encounter: Payer: Self-pay | Admitting: Family Medicine

## 2022-11-11 VITALS — BP 125/72 | HR 86 | Ht 68.0 in | Wt 207.1 lb

## 2022-11-11 DIAGNOSIS — Z0001 Encounter for general adult medical examination with abnormal findings: Secondary | ICD-10-CM | POA: Diagnosis not present

## 2022-11-11 DIAGNOSIS — M170 Bilateral primary osteoarthritis of knee: Secondary | ICD-10-CM

## 2022-11-11 DIAGNOSIS — I10 Essential (primary) hypertension: Secondary | ICD-10-CM

## 2022-11-11 DIAGNOSIS — E785 Hyperlipidemia, unspecified: Secondary | ICD-10-CM | POA: Diagnosis not present

## 2022-11-11 NOTE — Progress Notes (Signed)
   Norman Peters     MRN: [id number]      DOB: [birth year]   HPI: Patient is in for annual physical exam. Ready for knee surgery , due to pain and stiffness, and blood sugar is now well controled Recent labs,  are reviewed. Immunization is reviewed , and  is up to date.    PE; BP 125/72 (BP Location: Right Arm, Patient Position: Sitting, Cuff Size: Large)   Pulse 86   Ht '5\' 8"'$  (1.727 m)   Wt 207 lb 1.9 oz (93.9 kg)   SpO2 92%   BMI 31.49 kg/m   Pleasant male, alert and oriented x 3, in no cardio-pulmonary distress. Afebrile. HEENT No facial trauma or asymetry. Sinuses non tender. EOMI External ears normal,  Neck: decreased ROM, no adenopathy,JVD or thyromegaly.No bruits.  Chest: Clear to ascultation bilaterally.No crackles or wheezes. Non tender to palpation  Cardiovascular system; Heart sounds normal,  S1 and  S2 ,no S3.  No murmur, or thrill. Apical beat not displaced Peripheral pulses normal.  Abdomen: Soft, non tender, no  rebound.    Musculoskeletal exam: Decreased  ROM of spine,  and knees.  deformity ,swelling and crepitus noted, both knees No muscle wasting or atrophy.   Neurologic: Cranial nerves 2 to 12 intact. Power, tone ,sensation  normal throughout.  disturbance in gait. No tremor.  Skin: Intact, no ulceration, erythema , scaling or rash noted. Pigmentation normal throughout  Psych; Normal mood and affect. Judgement and concentration normal   Assessment & Plan:  Annual visit for general adult medical examination with abnormal findings Annual exam as documented. Counseling done  re healthy lifestyle involving commitment to 150 minutes exercise per week, heart healthy diet, and attaining healthy weight.The importance of adequate sleep also discussed. Regular seat belt use and home safety, is also discussed. Changes in health habits are decided on by the patient with goals and time frames  set for achieving them. Immunization and cancer  screening needs are specifically addressed at this visit.

## 2022-11-11 NOTE — Assessment & Plan Note (Signed)
Controlled, no change in medication  

## 2022-11-11 NOTE — Assessment & Plan Note (Signed)
Hyperlipidemia:Low fat diet discussed and encouraged.   Lipid Panel  Lab Results  Component Value Date   CHOL 108 09/15/2022   HDL 34 (L) 09/15/2022   LDLCALC 50 09/15/2022   TRIG 133 09/15/2022   CHOLHDL 3.2 09/15/2022     Updated lab needed at/ before next visit. Ncouraged to increase exerciase to improve LDL

## 2022-11-11 NOTE — Assessment & Plan Note (Signed)

## 2022-11-11 NOTE — Patient Instructions (Addendum)
Follow-up mid June, call if you need me sooner.  Blood pressure is excellent and blood sugar is very well-controlled at this time, congratulations.    Please give patient appointment for Dr. Marylene Buerger at checkout if possible, he was referred and a visit authorized  2 months ago.  Very important that you keep your appointment with rheumatologist as well as upcoming diabetic appointment.   I have referred you to Dr Aline Brochure for further management of your knees.  Be careful not to fall.  Fasting lipid CMP and EGFR 3 to 5 days before your June appointment.  Thanks for choosing [facility], we consider it a privelige to serve you.

## 2022-11-19 ENCOUNTER — Ambulatory Visit (INDEPENDENT_AMBULATORY_CARE_PROVIDER_SITE_OTHER): Payer: Medicare HMO | Admitting: Nurse Practitioner

## 2022-11-19 ENCOUNTER — Encounter: Payer: Self-pay | Admitting: Nurse Practitioner

## 2022-11-19 VITALS — BP 130/75 | HR 80 | Ht 68.0 in | Wt 209.8 lb

## 2022-11-19 DIAGNOSIS — E1122 Type 2 diabetes mellitus with diabetic chronic kidney disease: Secondary | ICD-10-CM | POA: Diagnosis not present

## 2022-11-19 DIAGNOSIS — Z794 Long term (current) use of insulin: Secondary | ICD-10-CM | POA: Diagnosis not present

## 2022-11-19 DIAGNOSIS — N1832 Chronic kidney disease, stage 3b: Secondary | ICD-10-CM | POA: Diagnosis not present

## 2022-11-19 DIAGNOSIS — I10 Essential (primary) hypertension: Secondary | ICD-10-CM

## 2022-11-19 DIAGNOSIS — E782 Mixed hyperlipidemia: Secondary | ICD-10-CM | POA: Diagnosis not present

## 2022-11-19 LAB — POCT GLYCOSYLATED HEMOGLOBIN (HGB A1C): Hemoglobin A1C: 6.8 % — AB (ref 4.0–5.6)

## 2022-11-19 MED ORDER — LANTUS SOLOSTAR 100 UNIT/ML ~~LOC~~ SOPN: 30.0000 [IU] | PEN_INJECTOR | Freq: Every day | SUBCUTANEOUS | 3 refills | Status: DC

## 2022-11-19 NOTE — Progress Notes (Signed)
Endocrinology follow-up note    11/19/2022, 9:38 AM   Subjective:    Patient ID: Norman Peters, male    DOB: 21-Oct-1956.  Norman Peters is being seen in follow-up after he was seen in consultation for management of currently uncontrolled symptomatic diabetes requested by  Fayrene Helper, MD.   Past Medical History:  Diagnosis Date   Acute left-sided low back pain with left-sided sciatica 11/04/2020   Anxiety    Arthritis    Depression 2001   Diabetes mellitus 2010   GERD (gastroesophageal reflux disease)    Hyperlipidemia    Hypertension 2005    Past Surgical History:  Procedure Laterality Date   COLONOSCOPY N/A 07/07/2016   Procedure: COLONOSCOPY;  Surgeon: Rogene Houston, MD;  Location: [facility];  Service: Endoscopy;  Laterality: N/A;  1030   FINGER SURGERY Left 1974   fifth , trauma on job, had to replace/repair finger   HERNIA REPAIR  6269   x3, umbilical and bilateral hernia    Social History   Socioeconomic History   Marital status: Married    Spouse name: Not on file   Number of children: Not on file   Years of education: Not on file   Highest education level: Not on file  Occupational History   Not on file  Tobacco Use   Smoking status: Former    Packs/day: 0.50    Years: 30.00    Total pack years: 15.00    Types: Cigarettes    Quit date: 01/20/2011    Years since quitting: 11.8   Smokeless tobacco: Never  Vaping Use   Vaping Use: Never used  Substance and Sexual Activity   Alcohol use: No   Drug use: No   Sexual activity: Not Currently  Other Topics Concern   Not on file  Social History Narrative   Not on file   Social Determinants of Health   Financial Resource Strain: Low Risk  (04/22/2022)   Overall Financial Resource Strain (CARDIA)    Difficulty of Paying Living Expenses: Not hard at all  Food Insecurity: No Food Insecurity (04/22/2022)   Hunger  Vital Sign    Worried About Running Out of Food in the Last Year: Never true    Ran Out of Food in the Last Year: Never true  Transportation Needs: No Transportation Needs (04/22/2022)   PRAPARE - Hydrologist (Medical): No    Lack of Transportation (Non-Medical): No  Physical Activity: Insufficiently Active (04/22/2022)   Exercise Vital Sign    Days of Exercise per Week: 2 days    Minutes of Exercise per Session: 20 min  Stress: No Stress Concern Present (04/22/2022)   [place]    Feeling of Stress : Not at all  Social Connections: Moderately Integrated (04/22/2022)   Social Connection and Isolation Panel [NHANES]    Frequency of Communication with Friends and Family: More than three times a week    Frequency of Social Gatherings with Friends and Family: More than three times a week    Attends Religious  Services: More than 4 times per year    Active Member of Clubs or Organizations: No    Attends Archivist Meetings: Never    Marital Status: Married    Family History  Problem Relation Age of Onset   Stroke Mother     Outpatient Encounter Medications as of 11/19/2022  Medication Sig   acetaminophen (TYLENOL) 325 MG tablet Take 650 mg by mouth every 6 (six) hours as needed.   albuterol (VENTOLIN HFA) 108 (90 Base) MCG/ACT inhaler Inhale 2 puffs into the lungs daily as needed for wheezing or shortness of breath.   amlodipine-olmesartan (AZOR) 10-20 MG tablet Take 1 tablet by mouth daily.   aspirin EC 81 MG tablet Take 81 mg by mouth every morning.   busPIRone (BUSPAR) 5 MG tablet Take 1 tablet (5 mg total) by mouth 2 (two) times daily.   diclofenac Sodium (VOLTAREN) 1 % GEL APPLY 4  GRAMS TOPICALLY TWICE DAILY   fenofibrate (TRICOR) 48 MG tablet Take 1 tablet (48 mg total) by mouth daily.   FLUoxetine (PROZAC) 40 MG capsule TAKE 1 CAPSULE EVERY DAY   hydrochlorothiazide  (HYDRODIURIL) 25 MG tablet Take 1 tablet (25 mg total) by mouth daily.   montelukast (SINGULAIR) 10 MG tablet Take 1 tablet by mouth once daily   rosuvastatin (CRESTOR) 5 MG tablet TAKE 1 TABLET EVERY MONDAY, WEDNESDAY AND FRIDAY (DISCONTINUE PRAVASTATIN)   Semaglutide,0.25 or 0.'5MG'$ /DOS, (OZEMPIC, 0.25 OR 0.5 MG/DOSE,) 2 MG/3ML SOPN Inject 0.5 mg into the skin once a week. Inject 0.25 mg SQ weekly x 2 doses then increase to 0.5 mg SQ weekly thereafter if tolerated well.   traMADol (ULTRAM) 50 MG tablet Take 1 tablet (50 mg total) by mouth 2 (two) times daily.   TRUE METRIX BLOOD GLUCOSE TEST test strip TEST BLOOD SUGAR ONE TIME DAILY AS DIRECTED   [DISCONTINUED] insulin glargine (LANTUS SOLOSTAR) 100 UNIT/ML Solostar Pen Inject 40 Units into the skin at bedtime.   insulin glargine (LANTUS SOLOSTAR) 100 UNIT/ML Solostar Pen Inject 30 Units into the skin at bedtime.   No facility-administered encounter medications on file as of 11/19/2022.    ALLERGIES: Allergies  Allergen Reactions   Ibuprofen Rash   Lisinopril Cough   Strawberry [Berry] Hives and Swelling    TONGUE SWELLING    VACCINATION STATUS: Immunization History  Administered Date(s) Administered   Fluad Quad(high Dose 65+) 09/02/2020, 07/17/2021, 09/15/2022   Influenza,inj,Quad PF,6+ Mos 07/14/2016, 08/25/2017, 08/30/2018, 07/02/2019   Moderna Covid-19 Vaccine Bivalent Booster 18yr & up 10/01/2022   PFIZER(Purple Top)SARS-COV-2 Vaccination 01/25/2020, 02/19/2020, 09/28/2020, 05/29/2021, 09/25/2021   PNEUMOCOCCAL CONJUGATE-20 07/17/2021   Pfizer Covid-19 Vaccine Bivalent Booster 167yr& up 09/25/2021   Pneumococcal Conjugate-13 10/08/2022   Pneumococcal Polysaccharide-23 02/10/2016   Rsv, Bivalent, Protein Subunit Rsvpref,pf (AEvans Lance12/11/2021   Tdap 07/04/2021   Zoster Recombinat (Shingrix) 07/08/2021, 09/09/2021    Diabetes He presents for his follow-up diabetic visit. He has type 2 diabetes mellitus. Onset time: He was  diagnosed at approximate age of [age]. His disease course has been stable. Pertinent negatives for hypoglycemia include no confusion, headaches, pallor or seizures. Associated symptoms include fatigue. Pertinent negatives for diabetes include no blurred vision, no chest pain, no polydipsia, no polyphagia, no polyuria, no weakness and no weight loss. Hypoglycemia complications include nocturnal hypoglycemia and required assistance (went to ED for hypoglycemia since last visit). Symptoms are improving. Diabetic complications include nephropathy and peripheral neuropathy. Risk factors for coronary artery disease include diabetes mellitus, dyslipidemia, obesity, hypertension,  male sex, tobacco exposure and sedentary lifestyle. Current diabetic treatment includes insulin injections (and Ozempic). He is compliant with treatment most of the time. His weight is decreasing steadily. He is following a generally healthy diet. When asked about meal planning, he reported none. He has had a previous visit with a dietitian. He rarely participates in exercise. His home blood glucose trend is fluctuating minimally. His overall blood glucose range is 140-180 mg/dl. (He presents today with his meter and logs showing at goal glycemic profile overall.  His POCT A1c today is 6.8%, unchanged from last visit.  He does note some random episodes of hypoglycemia both overnight and during the day.  Analysis of his meter shows 7-day average of 144, 14-day average of 133, 30-day average of 139, 90-day average of 139.  He never heard about his CGM ordered last visit.) An ACE inhibitor/angiotensin II receptor blocker is not being taken. He does not see a podiatrist.Eye exam is current.  Hyperlipidemia This is a chronic problem. The current episode started more than 1 year ago. The problem is uncontrolled. Recent lipid tests were reviewed and are variable. Exacerbating diseases include chronic renal disease, diabetes and obesity. Factors  aggravating his hyperlipidemia include fatty foods. Pertinent negatives include no chest pain, myalgias or shortness of breath. Current antihyperlipidemic treatment includes statins. The current treatment provides mild improvement of lipids. Compliance problems include adherence to exercise and adherence to diet.  Risk factors for coronary artery disease include diabetes mellitus, dyslipidemia, male sex, obesity, hypertension and a sedentary lifestyle.  Hypertension This is a chronic problem. The current episode started more than 1 year ago. The problem has been resolved since onset. The problem is controlled. Pertinent negatives include no blurred vision, chest pain, headaches, neck pain, palpitations or shortness of breath. There are no associated agents to hypertension. Risk factors for coronary artery disease include diabetes mellitus, male gender, smoking/tobacco exposure, sedentary lifestyle, obesity and dyslipidemia. Past treatments include calcium channel blockers and diuretics. The current treatment provides mild improvement. There are no compliance problems.  Hypertensive end-organ damage includes kidney disease. Identifiable causes of hypertension include chronic renal disease.    Review of systems  Constitutional: +steadily increasing body weight,  current Body mass index is 31.9 kg/m. , no fatigue, no subjective hyperthermia, no subjective hypothermia Eyes: no blurry vision, no xerophthalmia ENT: no sore throat, no nodules palpated in throat, no dysphagia/odynophagia, no hoarseness Cardiovascular: no chest pain, no shortness of breath, no palpitations, no leg swelling Respiratory: no cough, no shortness of breath Gastrointestinal: no nausea/vomiting/diarrhea Musculoskeletal: no muscle/joint aches Skin: no rashes, no hyperemia Neurological: no tremors, no numbness, no tingling, no dizziness Psychiatric: no depression, no anxiety   Objective:    BP 130/75 (BP Location: Right Arm,  Patient Position: Sitting, Cuff Size: Large)   Pulse 80   Ht '5\' 8"'$  (1.727 m)   Wt 209 lb 12.8 oz (95.2 kg)   BMI 31.90 kg/m   Wt Readings from Last 3 Encounters:  11/19/22 209 lb 12.8 oz (95.2 kg)  11/11/22 207 lb 1.9 oz (93.9 kg)  10/23/22 202 lb 13.2 oz (92 kg)    BP Readings from Last 3 Encounters:  11/19/22 130/75  11/11/22 125/72  10/23/22 123/83     Physical Exam- Limited  Constitutional:  Body mass index is 31.9 kg/m. , not in acute distress, normal state of mind Eyes:  EOMI, no exophthalmos Musculoskeletal: no gross deformities, strength intact in all four extremities, no gross restriction of joint movements Skin:  no rashes, no hyperemia Neurological: no tremor with outstretched hands    CMP     Component Value Date/Time   NA 135 10/23/2022 0242   NA 138 09/15/2022 1427   K 4.1 10/23/2022 0242   CL 101 10/23/2022 0242   CO2 27 10/23/2022 0242   GLUCOSE 113 (H) 10/23/2022 0242   BUN 22 10/23/2022 0242   BUN 20 09/15/2022 1427   CREATININE 1.83 (H) 10/23/2022 0242   CREATININE 1.47 (H) 08/25/2020 0927   CALCIUM 9.4 10/23/2022 0242   PROT 7.8 09/15/2022 1427   ALBUMIN 4.5 09/15/2022 1427   AST 12 09/15/2022 1427   ALT 10 09/15/2022 1427   ALKPHOS 114 09/15/2022 1427   BILITOT 0.5 09/15/2022 1427   GFRNONAA 40 (L) 10/23/2022 0242   GFRNONAA 50 (L) 08/25/2020 0927   GFRAA 61 12/10/2020 0933   GFRAA 58 (L) 08/25/2020 0927     Diabetic Labs (most recent): Lab Results  Component Value Date   HGBA1C 6.8 (A) 11/19/2022   HGBA1C 6.8 (A) 07/20/2022   HGBA1C 10.7 (A) 03/04/2022   MICROALBUR 150 03/25/2022   MICROALBUR 150 03/16/2021   MICROALBUR 188.1 (H) 01/07/2020     Lipid Panel     Component Value Date/Time   CHOL 108 09/15/2022 1427   TRIG 133 09/15/2022 1427   HDL 34 (L) 09/15/2022 1427   CHOLHDL 3.2 09/15/2022 1427   CHOLHDL 3.4 08/25/2020 0927   VLDL 39 (H) 05/14/2017 0831   LDLCALC 50 09/15/2022 1427   LDLCALC 55 08/25/2020 0927    LABVLDL 24 09/15/2022 1427      Lab Results  Component Value Date   TSH 1.300 03/04/2022   TSH 2.215 07/22/2021   TSH 2.100 06/10/2021   TSH 2.79 08/25/2020   TSH 1.96 08/24/2019   TSH 1.34 01/31/2018   TSH 1.61 02/10/2016   FREET4 1.14 06/10/2021   FREET4 1.0 08/25/2020     Assessment & Plan:   1) Type 2 diabetes mellitus with stage 3a chronic kidney disease, with long-term current use of insulin ([place])  - Norman Peters has currently uncontrolled symptomatic type 2 DM since 67 years of age.  He presents today with his meter and logs showing at goal glycemic profile overall.  His POCT A1c today is 6.8%, unchanged from last visit.  He does note some random episodes of hypoglycemia both overnight and during the day.  Analysis of his meter shows 7-day average of 144, 14-day average of 133, 30-day average of 139, 90-day average of 139.  He never heard about his CGM ordered last visit.   Recent labs reviewed.  - I had a long discussion with him about the progressive nature of diabetes and the pathology behind its complications. -his diabetes is complicated by CKD and he remains at a high risk for more acute and chronic complications which include CAD, CVA, CKD, retinopathy, and neuropathy. These are all discussed in detail with him.  - Nutritional counseling repeated at each appointment due to patients tendency to fall back in to old habits.  - The patient admits there is a room for improvement in their diet and drink choices. -  Suggestion is made for the patient to avoid simple carbohydrates from their diet including Cakes, Sweet Desserts / Pastries, Ice Cream, Soda (diet and regular), Sweet Tea, Candies, Chips, Cookies, Sweet Pastries, Store Bought Juices, Alcohol in Excess of 1-2 drinks a day, Artificial Sweeteners, Coffee Creamer, and "Sugar-free" Products. This will help patient to have stable blood  glucose profile and potentially avoid unintended weight gain.   - I encouraged the  patient to switch to unprocessed or minimally processed complex starch and increased protein intake (animal or plant source), fruits, and vegetables.   - Patient is advised to stick to a routine mealtimes to eat 3 meals a day and avoid unnecessary snacks (to snack only to correct hypoglycemia).  - I have approached him with the following individualized plan to manage his diabetes and patient agrees:   - he will continue to benefit from basal insulin.    -Based on his hypoglycemia, he is advised to lower his Lantus to 30 units SQ nightly and continue his Ozempic 0.5 mg SQ weekly.   -He is encouraged to continue monitoring blood glucose twice daily, before breakfast and before bed, and to call the clinic if he has readings less than 70 or greater than 200 for 3 tests in a row.  I did resend his order for Dexcom G7 to [facility].  He is most certainly a good candidate for this device given his random drops in glucose and treatment with insulin.  - he is warned not to take insulin without proper monitoring per orders.  -He reports that he had developed unexplained cough while taking Glipizide and Metformin.  After he finished his Glipizide and Metformin, reportedly the cough has improved.  He is advised to stay off of Glipizide and Metformin for now.   - Specific targets for  A1c;  LDL, HDL,  and Triglycerides were discussed with the patient.  2) Blood Pressure /Hypertension:  His blood pressure is controlled to target.  he is advised to continue his current medications including Amlodipine 10 mg p.o. daily, Olmesartan 20 mg po daily with breakfast and HCTZ 12.5 mg po daily.  He never heard anything about his nephrology referral.  3) Lipids/Hyperlipidemia:   His most recent lipid panel from 09/15/22 shows controlled LDL of 50.  He is advised to continue Crestor 5 mg po every other day at bedtime and Fenofibrate 48 mg po daily.  Side effects and precautions discussed with him.   4)  Weight/Diet:   His Body mass index is 31.9 kg/m.  -   clearly complicating his diabetes care.   he is a candidate for weight loss. I discussed with him the fact that loss of 5 - 10% of his  current body weight will have the most impact on his diabetes management.  Exercise, and detailed carbohydrates information provided  -  detailed on discharge instructions.  5) Chronic Care/Health Maintenance: -he is on a Statin medications and is encouraged to initiate and continue to follow up with Ophthalmology, Dentist, Podiatrist at least yearly or according to recommendations, and advised to stay away from smoking. I have recommended yearly flu vaccine and pneumonia vaccine at least every 5 years; moderate intensity exercise for up to 150 minutes weekly; and  sleep for at least 7 hours a day.  - he is advised to maintain close follow up with Fayrene Helper, MD for primary care needs, as well as his other providers for optimal and coordinated care.     I spent 47 minutes in the care of the patient today including review of labs from [place], Lipids, Thyroid Function, Hematology (current and previous including abstractions from other facilities); face-to-face time discussing  his blood glucose readings/logs, discussing hypoglycemia and hyperglycemia episodes and symptoms, medications doses, his options of short and long term treatment based on the latest standards of  care / guidelines;  discussion about incorporating lifestyle medicine;  and documenting the encounter. Risk reduction counseling performed per USPSTF guidelines to reduce obesity and cardiovascular risk factors.     Please refer to Patient Instructions for Blood Glucose Monitoring and Insulin/Medications Dosing Guide"  in media tab for additional information. Please  also refer to " Patient Self Inventory" in the Media  tab for reviewed elements of pertinent patient history.  Norman Peters participated in the discussions, expressed understanding, and voiced  agreement with the above plans.  All questions were answered to his satisfaction. he is encouraged to contact clinic should he have any questions or concerns prior to his return visit.   Follow up plan: - Return in about 4 months (around 03/20/2023) for Diabetes F/U with A1c in office, No previsit labs, Bring meter and logs.  Rayetta Pigg, [facility] [address] [place], [place] [address] Phone: [phone] Fax: [phone]  11/19/2022, 9:38 AM

## 2022-11-23 ENCOUNTER — Other Ambulatory Visit: Payer: Self-pay

## 2022-11-23 MED ORDER — OZEMPIC (0.25 OR 0.5 MG/DOSE) 2 MG/3ML ~~LOC~~ SOPN: 0.5000 mg | PEN_INJECTOR | SUBCUTANEOUS | 0 refills | Status: DC

## 2022-11-24 ENCOUNTER — Ambulatory Visit (INDEPENDENT_AMBULATORY_CARE_PROVIDER_SITE_OTHER): Payer: Medicare HMO

## 2022-11-24 ENCOUNTER — Ambulatory Visit (INDEPENDENT_AMBULATORY_CARE_PROVIDER_SITE_OTHER): Payer: Medicare HMO | Admitting: Orthopedic Surgery

## 2022-11-24 VITALS — BP 140/77 | HR 82 | Ht 68.0 in | Wt 207.0 lb

## 2022-11-24 DIAGNOSIS — M25561 Pain in right knee: Secondary | ICD-10-CM

## 2022-11-24 DIAGNOSIS — G8929 Other chronic pain: Secondary | ICD-10-CM

## 2022-11-24 DIAGNOSIS — M545 Low back pain, unspecified: Secondary | ICD-10-CM

## 2022-11-24 DIAGNOSIS — M1712 Unilateral primary osteoarthritis, left knee: Secondary | ICD-10-CM

## 2022-11-24 DIAGNOSIS — M25562 Pain in left knee: Secondary | ICD-10-CM

## 2022-11-24 DIAGNOSIS — M1711 Unilateral primary osteoarthritis, right knee: Secondary | ICD-10-CM | POA: Diagnosis not present

## 2022-11-24 MED ORDER — NAPROXEN 500 MG PO TABS: 500.0000 mg | ORAL_TABLET | Freq: Two times a day (BID) | ORAL | 2 refills | Status: DC

## 2022-11-24 MED ORDER — METHOCARBAMOL 750 MG PO TABS: 750.0000 mg | ORAL_TABLET | Freq: Four times a day (QID) | ORAL | 2 refills | Status: DC

## 2022-11-24 NOTE — Patient Instructions (Signed)
CALL [phone] TO SCHEDULE YOUR THERAPY

## 2022-11-24 NOTE — Progress Notes (Signed)
Chief Complaint  Patient presents with   Knee Pain    Bilateral no injury has been hurting for a long time needed knee replacements but couldn't  have it done due to A1C too high but states last one was 6.8   Back Pain    Mid back pain It started hurting worse 2 months ago can't sit for long periods of time may of done something trying to put christmas tree in atic   67 year old male previously seen for osteoarthritis of his knee now has his hemoglobin A1c about 6.8  He is a good candidate for knee replacement surgery with a BMI of 31  We will get x-rays today of the right and left knee to see which knee needs to be addressed first  As far as his back goes we will go ahead and get an x-ray and probably have him go to some physical therapy and once that clears we can schedule knee surgery  Physical Exam Constitutional:      Appearance: Normal appearance. He is normal weight.  HENT:     Nose: No rhinorrhea.  Eyes:     General: No scleral icterus.       Right eye: No discharge.        Left eye: No discharge.  Cardiovascular:     Rate and Rhythm: Normal rate and regular rhythm.     Pulses: Normal pulses.  Musculoskeletal:     Lumbar back: Signs of trauma, spasms and tenderness present. No swelling, edema, deformity or lacerations. Decreased range of motion. Negative right straight leg raise test and negative left straight leg raise test. No scoliosis.     Right knee: Deformity and crepitus present. No swelling, effusion, erythema, ecchymosis or lacerations. Decreased range of motion. Tenderness present over the medial joint line. No LCL laxity, MCL laxity, ACL laxity or PCL laxity. Abnormal alignment. Normal pulse.     Left knee: Crepitus present. No swelling, deformity, effusion, erythema, ecchymosis or lacerations. Tenderness present over the lateral joint line. No LCL laxity, MCL laxity, ACL laxity or PCL laxity.Abnormal alignment. Normal pulse.     Comments: We note a varus knee on the  right and a valgus knee on the left  Neurological:     Mental Status: He is alert.     Sensory: No sensory deficit.     Motor: No weakness.     Coordination: Coordination normal.     Gait: Gait abnormal.     Deep Tendon Reflexes: Reflexes normal.  Psychiatric:        Mood and Affect: Mood normal.        Behavior: Behavior normal.        Thought Content: Thought content normal.        Judgment: Judgment normal.    Encounter Diagnoses  Name Primary?   Chronic pain of both knees    Chronic bilateral low back pain without sciatica Yes    Mr. Dalesandro will go to physical therapy we will start some medication for his back spasms and once we get his back under control we can proceed with total knee left or right his preference   Meds ordered this encounter  Medications   methocarbamol (ROBAXIN) 750 MG tablet    Sig: Take 1 tablet (750 mg total) by mouth 4 (four) times daily.    Dispense:  60 tablet    Refill:  2   naproxen (NAPROSYN) 500 MG tablet    Sig: Take 1 tablet (500 mg  total) by mouth 2 (two) times daily with a meal.    Dispense:  60 tablet    Refill:  2

## 2022-11-25 DIAGNOSIS — E1122 Type 2 diabetes mellitus with diabetic chronic kidney disease: Secondary | ICD-10-CM | POA: Diagnosis not present

## 2022-11-25 DIAGNOSIS — E1165 Type 2 diabetes mellitus with hyperglycemia: Secondary | ICD-10-CM | POA: Diagnosis not present

## 2022-12-03 ENCOUNTER — Other Ambulatory Visit (HOSPITAL_COMMUNITY): Payer: Self-pay

## 2022-12-03 ENCOUNTER — Other Ambulatory Visit: Payer: Self-pay | Admitting: Family Medicine

## 2022-12-03 MED ORDER — BUSPIRONE HCL 5 MG PO TABS
5.0000 mg | ORAL_TABLET | Freq: Two times a day (BID) | ORAL | 1 refills | Status: DC
  Filled 2022-12-03 (×2): qty 60, 30d supply, fill #0
  Filled 2023-01-10: qty 60, 30d supply, fill #1

## 2022-12-15 ENCOUNTER — Ambulatory Visit (HOSPITAL_COMMUNITY): Payer: Medicare HMO | Attending: Family Medicine | Admitting: Physical Therapy

## 2022-12-15 ENCOUNTER — Encounter (HOSPITAL_COMMUNITY): Payer: Self-pay | Admitting: Physical Therapy

## 2022-12-15 ENCOUNTER — Other Ambulatory Visit: Payer: Self-pay

## 2022-12-15 DIAGNOSIS — E559 Vitamin D deficiency, unspecified: Secondary | ICD-10-CM | POA: Diagnosis present

## 2022-12-15 DIAGNOSIS — M5459 Other low back pain: Secondary | ICD-10-CM | POA: Diagnosis not present

## 2022-12-15 DIAGNOSIS — M545 Low back pain, unspecified: Secondary | ICD-10-CM | POA: Insufficient documentation

## 2022-12-15 DIAGNOSIS — I10 Essential (primary) hypertension: Secondary | ICD-10-CM | POA: Diagnosis present

## 2022-12-15 DIAGNOSIS — Z6829 Body mass index (BMI) 29.0-29.9, adult: Secondary | ICD-10-CM | POA: Diagnosis present

## 2022-12-15 DIAGNOSIS — F32A Depression, unspecified: Secondary | ICD-10-CM | POA: Insufficient documentation

## 2022-12-15 DIAGNOSIS — M79642 Pain in left hand: Secondary | ICD-10-CM | POA: Diagnosis not present

## 2022-12-15 DIAGNOSIS — F419 Anxiety disorder, unspecified: Secondary | ICD-10-CM | POA: Insufficient documentation

## 2022-12-15 DIAGNOSIS — M79671 Pain in right foot: Secondary | ICD-10-CM | POA: Diagnosis present

## 2022-12-15 DIAGNOSIS — N1832 Chronic kidney disease, stage 3b: Secondary | ICD-10-CM | POA: Diagnosis present

## 2022-12-15 DIAGNOSIS — E1140 Type 2 diabetes mellitus with diabetic neuropathy, unspecified: Secondary | ICD-10-CM | POA: Diagnosis present

## 2022-12-15 DIAGNOSIS — M170 Bilateral primary osteoarthritis of knee: Secondary | ICD-10-CM | POA: Insufficient documentation

## 2022-12-15 DIAGNOSIS — R5383 Other fatigue: Secondary | ICD-10-CM | POA: Insufficient documentation

## 2022-12-15 DIAGNOSIS — M5136 Other intervertebral disc degeneration, lumbar region: Secondary | ICD-10-CM | POA: Diagnosis present

## 2022-12-15 DIAGNOSIS — M6281 Muscle weakness (generalized): Secondary | ICD-10-CM | POA: Diagnosis not present

## 2022-12-15 DIAGNOSIS — M2550 Pain in unspecified joint: Secondary | ICD-10-CM | POA: Diagnosis not present

## 2022-12-15 DIAGNOSIS — E785 Hyperlipidemia, unspecified: Secondary | ICD-10-CM | POA: Diagnosis present

## 2022-12-15 DIAGNOSIS — D539 Nutritional anemia, unspecified: Secondary | ICD-10-CM | POA: Insufficient documentation

## 2022-12-15 DIAGNOSIS — E538 Deficiency of other specified B group vitamins: Secondary | ICD-10-CM | POA: Insufficient documentation

## 2022-12-15 DIAGNOSIS — R29898 Other symptoms and signs involving the musculoskeletal system: Secondary | ICD-10-CM | POA: Diagnosis not present

## 2022-12-15 DIAGNOSIS — M79672 Pain in left foot: Secondary | ICD-10-CM | POA: Diagnosis present

## 2022-12-15 DIAGNOSIS — J302 Other seasonal allergic rhinitis: Secondary | ICD-10-CM | POA: Insufficient documentation

## 2022-12-15 DIAGNOSIS — M79641 Pain in right hand: Secondary | ICD-10-CM | POA: Diagnosis not present

## 2022-12-15 DIAGNOSIS — G8929 Other chronic pain: Secondary | ICD-10-CM | POA: Insufficient documentation

## 2022-12-15 DIAGNOSIS — R768 Other specified abnormal immunological findings in serum: Secondary | ICD-10-CM | POA: Insufficient documentation

## 2022-12-15 DIAGNOSIS — R2689 Other abnormalities of gait and mobility: Secondary | ICD-10-CM | POA: Insufficient documentation

## 2022-12-15 NOTE — Therapy (Signed)
OUTPATIENT PHYSICAL THERAPY THORACOLUMBAR EVALUATION   Patient Name: Norman Peters MRN: [id number] DOB:[birth year], 67 y.o., male Today's Date: 12/15/2022  END OF SESSION:  PT End of Session - 12/15/22 1358     Visit Number 1    Number of Visits 12    Date for PT Re-Evaluation 01/26/23    Authorization Type Primary [employer]; Secondary Medicaid    Progress Note Due on Visit 10    PT Start Time 6144    PT Stop Time 1440    PT Time Calculation (min) 45 min    Activity Tolerance Patient tolerated treatment well    Behavior During Therapy [facility] for tasks assessed/performed             Past Medical History:  Diagnosis Date   Acute left-sided low back pain with left-sided sciatica 11/04/2020   Anxiety    Arthritis    Depression 2001   Diabetes mellitus 2010   GERD (gastroesophageal reflux disease)    Hyperlipidemia    Hypertension 2005   Past Surgical History:  Procedure Laterality Date   COLONOSCOPY N/A 07/07/2016   Procedure: COLONOSCOPY;  Surgeon: Rogene Houston, MD;  [facility]: [facility];  Service: Endoscopy;  Laterality: N/A;  1030   FINGER SURGERY Left 1974   fifth , trauma on job, had to replace/repair finger   HERNIA REPAIR  3154   x3, umbilical and bilateral hernia   Patient Active Problem List   Diagnosis Date Noted   Encounter for chronic pain management 07/12/2022   Back pain 06/21/2022   B12 deficiency 07/30/2021   Macrocytic anemia 07/22/2021   Chronic kidney disease 07/10/2021   Type 2 diabetes mellitus with diabetic neuropathy, unspecified ([place]) 07/06/2021   Seasonal allergies 09/12/2020   Osteoarthritis of right knee 09/02/2020   type 2 dia with ckd 02/05/2020   Annual visit for general adult medical examination with abnormal findings 01/11/2020   Vitamin D deficiency 01/11/2020   Obesity (BMI 30.0-34.9) 09/03/2019   Dental caries 09/03/2019   Chronic elbow pain, left 06/23/2019   Hyperlipidemia LDL goal <100 05/29/2017   Pain in joint involving  multiple sites 02/12/2016   Essential hypertension, benign 02/10/2016   Anxiety and depression 02/10/2016    PCP: Tula Nakayama MD  REFERRING PROVIDER: Carole Civil, MD  REFERRING DIAG: M54.50,G89.29 (ICD-10-CM) - Chronic bilateral low back pain without sciatica  Rationale for Evaluation and Treatment: Rehabilitation  THERAPY DIAG:  Other low back pain  Muscle weakness (generalized)  Other abnormalities of gait and mobility  Other symptoms and signs involving the musculoskeletal system  ONSET DATE: 2-3 months  SUBJECTIVE:  SUBJECTIVE STATEMENT: Patient has had back pain and muscle relaxer's have been helping. Symptoms have been there for about 2-3 months but has been getting worse. Has been trying to do a little bit of work around the house but it must have bothered his back. Been doing old HEP some but hasn't been doing anything from before since its been so bad.   PERTINENT HISTORY:  Chronic LBP, DM, HLD, anxiety, depression  PAIN:  Are you having pain? Yes: NPRS scale: 4/10 Pain location: low back Pain description: stabbing, pressure Aggravating factors: sit to stand/transfers; sometimes bed mobility Relieving factors: changing positions  PRECAUTIONS: None  WEIGHT BEARING RESTRICTIONS: No  FALLS:  Has patient fallen in last 6 months? No   OCCUPATION: Retired  PLOF: Independent  PATIENT GOALS: back to be feeling better  NEXT MD VISIT: unsure  OBJECTIVE:   PATIENT SURVEYS: FOTO 55% function  SCREENING FOR RED FLAGS: Bowel or bladder incontinence: No Spinal tumors: No Cauda equina syndrome: No Compression fracture: No Abdominal aneurysm: No  COGNITION: Overall cognitive status: Within functional limits for tasks assessed     SENSATION: WFL   POSTURE:  rounded shoulders, forward head, decreased lumbar lordosis, and decreased thoracic kyphosis  PALPATION: TTP lower lumbar paraspinals; hypomobile lower thoracic and lumbar spine  LUMBAR ROM:   AROM eval  Flexion 25% limited  Extension 25% limited  Right lateral flexion 25% limited  Left lateral flexion 50% limited  Right rotation 25% limited  Left rotation 25% limited   (Blank rows = not tested)  LOWER EXTREMITY ROM:   lacking TKE bilaterally with LAQ  Active  Right eval Left eval  Hip flexion    Hip extension    Hip abduction    Hip adduction    Hip internal rotation    Hip external rotation    Knee flexion    Knee extension    Ankle dorsiflexion    Ankle plantarflexion    Ankle inversion    Ankle eversion     (Blank rows = not tested)  LOWER EXTREMITY MMT:    MMT Right eval Left eval  Hip flexion 4+ 4  Hip extension 4* 4*  Hip abduction 4- 4  Hip adduction    Hip internal rotation    Hip external rotation    Knee flexion 4 4  Knee extension 4+ 4+  Ankle dorsiflexion 5 5  Ankle plantarflexion    Ankle inversion    Ankle eversion     (Blank rows = not tested)*=pain  LUMBAR SPECIAL TESTS:  Slump test: Negative  FUNCTIONAL TESTS:  5 times sit to stand: 17.35 seconds without UE support, unsteady, relies LLE 2 minute walk test: 360 feet  GAIT: Distance walked: 360 feet Assistive device utilized: None Level of assistance: Complete Independence Comments: 2MWT, bilateral knee pain lacking TKE  TODAY'S TREATMENT:  DATE:  12/15/22 POE 2 x 30 second holds  Press up 1 x 10  LAQ  1 x 10 5 second holds   PATIENT EDUCATION:  Education details: Patient educated on exam findings, POC, scope of PT, HEP, and dosing of exercises . Person educated: Patient Education method: Explanation, Demonstration, and Handouts Education comprehension:  verbalized understanding, returned demonstration, verbal cues required, and tactile cues required  HOME EXERCISE PROGRAM: Access Code: [email or web address] URL: [email or web address] Date: 12/15/2022 - Prone Press Up On Elbows  - 3-5 x daily - 7 x weekly - 3 reps - 30 second hold - Prone Press Up  - 3-5 x daily - 7 x weekly - 2 sets - 10 reps - Seated Long Arc Quad  - 3 x daily - 7 x weekly - 2 sets - 10 reps - 5 second hold  ASSESSMENT:  CLINICAL IMPRESSION: Patient a 67 y.o. y.o. male who was seen today for physical therapy evaluation and treatment for chronic LBP. Patient presents with pain limited deficits in lumbar strength, ROM, endurance, activity tolerance, and functional mobility with ADL. Patient is having to modify and restrict ADL as indicated by outcome measure score as well as subjective information and objective measures which is affecting overall participation. Patient will benefit from skilled physical therapy in order to improve function and reduce impairment.  OBJECTIVE IMPAIRMENTS: Abnormal gait, decreased activity tolerance, decreased balance, decreased endurance, decreased mobility, difficulty walking, decreased ROM, decreased strength, hypomobility, increased muscle spasms, impaired flexibility, improper body mechanics, postural dysfunction, and pain.   ACTIVITY LIMITATIONS: carrying, lifting, bending, standing, squatting, stairs, transfers, locomotion level, and caring for others  PARTICIPATION LIMITATIONS: meal prep, cleaning, laundry, shopping, community activity, and yard work  PERSONAL FACTORS: Fitness, Time since onset of injury/illness/exacerbation, and 3+ comorbidities: Chronic LBP, DM, HLD, anxiety, depression  are also affecting patient's functional outcome.   REHAB POTENTIAL: Good  CLINICAL DECISION MAKING: Stable/uncomplicated  EVALUATION COMPLEXITY: Low   GOALS: Goals reviewed with patient? Yes  SHORT TERM GOALS: Target date: 01/05/2023     Patient will be independent with HEP in order to improve functional outcomes. Baseline:  Goal status: INITIAL  2.  Patient will report at least 25% improvement in symptoms for improved quality of life. Baseline:  Goal status: INITIAL    LONG TERM GOALS: Target date: 01/26/2023    Patient will report at least 75% improvement in symptoms for improved quality of life. Baseline:  Goal status: INITIAL  2.  Patient will improve FOTO score by at least 7 points in order to indicate improved tolerance to activity. Baseline: 55% function Goal status: INITIAL  3.  Patient will demonstrate at least 25% improvement in lumbar ROM in all restricted planes for improved ability to move trunk while completing chores. Baseline: see above Goal status: INITIAL  4.  Patient will be able to ambulate at least 410 feet in 2MWT in order to demonstrate improved tolerance to activity. Baseline: 360 feet Goal status: INITIAL  5.  Patient will demonstrate grade of 5/5 MMT grade in all tested musculature as evidence of improved strength to assist with stair ambulation and gait.   Baseline: see above Goal status: INITIAL  6.  Patient will be able to complete 5x STS in under 11.4 seconds in order to demo improved functional strength. Baseline: 17.35 seconds Goal status: INITIAL   PLAN:  PT FREQUENCY: 2x/week  PT DURATION: 6 weeks  PLANNED INTERVENTIONS: Therapeutic exercises, Therapeutic activity, Neuromuscular re-education, Balance training, Gait training, Patient/Family education, Joint manipulation,  Joint mobilization, Stair training, Orthotic/Fit training, DME instructions, Aquatic Therapy, Dry Needling, Electrical stimulation, Spinal manipulation, Spinal mobilization, Cryotherapy, Moist heat, Compression bandaging, scar mobilization, Splintting, Taping, Traction, Ultrasound, Ionotophoresis '4mg'$ /ml Dexamethasone, and Manual therapy  PLAN FOR NEXT SESSION: f/u with HEP, continue with extension if  progressing; core strength and glute strength, postural strengthening; add general knee strength as able   Mearl Latin, PT 12/15/2022, 2:54 PM

## 2022-12-23 NOTE — Progress Notes (Signed)
Office Visit Note  Patient: Norman Peters             Date of Birth: Dec 15, 1955           MRN: [id number]             PCP: Fayrene Helper, MD Referring: Fayrene Helper, MD Visit Date: 01/06/2023 Occupation: '@GUAROCC'$ @  Subjective:  Pain in joints and +RF  History of Present Illness: Norman Peters is a 67 y.o. male in consultation per request of Dr. Moshe Cipro.  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  He has had discomfort in his multiple joints over the years.  He states the pain in his joints got more severe over the last 10 years which has been progressively getting worse.  He describes pain and discomfort in his both shoulders, elbows, wrists, hands, hips, knees and his feet.  He was diagnosed with severe osteoarthritis in his bilateral knee joints.  He was evaluated by Dr. Kenton Kingfisher who recommended bilateral total knee replacement.  According to the patient he could not have knee replacement because of being diabetic.  He also has been experiencing lower back discomfort over the last 6 months.  He was diagnosed with disc disease.  He is going to physical therapy.  He recently had labs done by Dr. Moshe Cipro which showed positive rheumatoid factor for that reason he was referred to me.  He notices intermittent swelling in his joints which he describes in his elbow and his knee joints.  Has not noticed swelling in his hands or his feet.  There is no family history of autoimmune disease.  Son is in good health.    Activities of Daily Living:  Patient reports morning stiffness for 10 minutes.   Patient Reports nocturnal pain.  Difficulty dressing/grooming: Denies Difficulty climbing stairs: Reports Difficulty getting out of chair: Reports Difficulty using hands for taps, buttons, cutlery, and/or writing: Reports  Review of Systems  Constitutional: Negative.  Negative for fatigue.  HENT:  Negative.  Negative for mouth sores and mouth dryness.   Eyes: Negative.  Negative for dryness.  Respiratory:  Positive for shortness of breath.   Cardiovascular: Negative.  Negative for chest pain and palpitations.  Gastrointestinal: Negative.  Negative for blood in stool, constipation and diarrhea.  Endocrine: Negative.  Negative for increased urination.  Genitourinary: Negative.  Negative for involuntary urination.  Musculoskeletal:  Positive for joint pain, gait problem, joint pain, joint swelling and morning stiffness. Negative for myalgias, muscle weakness, muscle tenderness and myalgias.  Skin: Negative.  Negative for pallor, rash, hair loss and sensitivity to sunlight.  Allergic/Immunologic: Negative.  Negative for susceptible to infections.  Neurological:  Positive for dizziness. Negative for headaches.  Hematological:  Negative for swollen glands.  Psychiatric/Behavioral:  Negative for depressed mood and sleep disturbance. The patient is nervous/anxious.     PMFS History:  Patient Active Problem List   Diagnosis Date Noted   Encounter for chronic pain management 07/12/2022   Back pain 06/21/2022   B12 deficiency 07/30/2021   Macrocytic anemia 07/22/2021   Chronic kidney disease 07/10/2021   Type 2 diabetes mellitus with diabetic neuropathy, unspecified ([place]) 07/06/2021   Seasonal allergies 09/12/2020   Osteoarthritis of right knee 09/02/2020   type 2 dia with ckd 02/05/2020   Annual visit for general adult medical examination with abnormal findings 01/11/2020   Vitamin D deficiency  01/11/2020   Obesity (BMI 30.0-34.9) 09/03/2019   Dental caries 09/03/2019   Chronic elbow pain, left 06/23/2019   Hyperlipidemia LDL goal <100 05/29/2017   Pain in joint involving multiple sites 02/12/2016   Essential hypertension, benign 02/10/2016   Anxiety and depression 02/10/2016    Past Medical History:  Diagnosis Date   Acute left-sided low back pain with left-sided sciatica  11/04/2020   Anxiety    Arthritis    Depression 2001   Diabetes mellitus 2010   GERD (gastroesophageal reflux disease)    Hyperlipidemia    Hypertension 2005    Family History  Problem Relation Age of Onset   Stroke Mother    Past Surgical History:  Procedure Laterality Date   COLONOSCOPY N/A 07/07/2016   Procedure: COLONOSCOPY;  Surgeon: Rogene Houston, MD;  Location: [facility];  Service: Endoscopy;  Laterality: N/A;  1030   FINGER SURGERY Left 1974   fifth , trauma on job, had to replace/repair finger   HERNIA REPAIR  123456   x3, umbilical and bilateral hernia   Social History   Social History Narrative   Not on file   Immunization History  Administered Date(s) Administered   Fluad Quad(high Dose 65+) 09/02/2020, 07/17/2021, 09/15/2022   Influenza,inj,Quad PF,6+ Mos 07/14/2016, 08/25/2017, 08/30/2018, 07/02/2019   Moderna Covid-19 Vaccine Bivalent Booster 48yr & up 10/01/2022   PFIZER(Purple Top)SARS-COV-2 Vaccination 01/25/2020, 02/19/2020, 09/28/2020, 05/29/2021, 09/25/2021   PNEUMOCOCCAL CONJUGATE-20 07/17/2021   Pfizer Covid-19 Vaccine Bivalent Booster 165yr& up 09/25/2021   Pneumococcal Conjugate-13 10/08/2022   Pneumococcal Polysaccharide-23 02/10/2016   Rsv, Bivalent, Protein Subunit Rsvpref,pf (AEvans Lance12/11/2021   Tdap 07/04/2021   Zoster Recombinat (Shingrix) 07/08/2021, 09/09/2021     Objective: Vital Signs: BP 126/79 (BP Location: Right Arm, Patient Position: Sitting, Cuff Size: Large)   Pulse 97   Resp 16   Ht '5\' 10"'$  (1.778 m)   Wt 208 lb (94.3 kg)   BMI 29.84 kg/m    Physical Exam Vitals and nursing note reviewed.  Constitutional:      Appearance: He is well-developed.  HENT:     Head: Normocephalic and atraumatic.  Eyes:     Conjunctiva/sclera: Conjunctivae normal.     Pupils: Pupils are equal, round, and reactive to light.  Cardiovascular:     Rate and Rhythm: Normal rate and regular rhythm.     Heart sounds: Normal heart sounds.   Pulmonary:     Effort: Pulmonary effort is normal.     Breath sounds: Normal breath sounds.  Abdominal:     General: Bowel sounds are normal.     Palpations: Abdomen is soft.  Musculoskeletal:     Cervical back: Normal range of motion and neck supple.  Skin:    General: Skin is warm and dry.     Capillary Refill: Capillary refill takes less than 2 seconds.  Neurological:     Mental Status: He is alert and oriented to person, place, and time.  Psychiatric:        Behavior: Behavior normal.      Musculoskeletal Exam: Good range of motion of the cervical spine.  He had limited painful range of motion of the lumbar spine.  He had limited internal rotation of the shoulders with discomfort.  Abduction and forward flexion was not limited.  Elbow joints were in good range of motion without any warmth swelling or effusion.  Wrist joints with good range of motion with no synovitis.  There was no synovitis over MCPs.  Bilateral PIP and DIP thickening was noted.  Hip joints and knee joints in good range of motion.  He had discomfort range of motion of knee joints without warmth swelling or effusion.  There was no tenderness over ankles.  There was no synovitis over MTPs.  Bilateral pes planus and hammertoes were noted.  CDAI Exam: CDAI Score: -- Patient Global: --; Provider Global: -- Swollen: --; Tender: -- Joint Exam 01/06/2023   No joint exam has been documented for this visit   There is currently no information documented on the homunculus. Go to the Rheumatology activity and complete the homunculus joint exam.  Investigation: No additional findings.  Imaging: No results found.  Recent Labs: Lab Results  Component Value Date   WBC 12.6 (H) 10/23/2022   HGB 14.7 10/23/2022   PLT 292 10/23/2022   NA 135 10/23/2022   K 4.1 10/23/2022   CL 101 10/23/2022   CO2 27 10/23/2022   GLUCOSE 113 (H) 10/23/2022   BUN 22 10/23/2022   CREATININE 1.83 (H) 10/23/2022   BILITOT 0.5  09/15/2022   ALKPHOS 114 09/15/2022   AST 12 09/15/2022   ALT 10 09/15/2022   PROT 7.8 09/15/2022   ALBUMIN 4.5 09/15/2022   CALCIUM 9.4 10/23/2022   GFRAA 61 12/10/2020   09/15/2022 rheumatoid factor 82.8  Speciality Comments: No specialty comments available.  Procedures:  No procedures performed Allergies: Ibuprofen, Lisinopril, and Strawberry [berry]   Assessment / Plan:     Visit Diagnoses: Polyarthralgia-patient complains of pain and discomfort in multiple joints for many years.  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  He complains of pain and discomfort in his bilateral shoulders, bilateral elbows, bilateral wrists and hands.  He also complains of discomfort in his hips knee joints ankles and his feet.  He notices intermittent swelling in his hands, knee joints and his feet.  He also gives history of olecranon bursitis 2 years ago.  According the patient resolved by itself.  Rheumatoid factor positive-rheumatoid factor was 82.8 in November 2023.  Pain in both hands -he complains of pain and discomfort in his bilateral hands.  He had bilateral PIP and DIP thickening.  No synovitis was noted.  Plan: XR Hand 2 View Right, XR Hand 2 View Left, x-rays were consistent with osteoarthritis.  I will obtain additional labs today.  Sedimentation rate, Cyclic citrul peptide antibody, IgG, Uric acid  Primary osteoarthritis of both knees-he complains of pain and discomfort in his bilateral knee joints.  I reviewed x-rays of his knee joints from November 24, 2022.  He had severe medial compartment narrowing of the right knee joint and severe lateral compartment narrowing of the left knee joint.  Patient states that he was seen by Dr. Aline Brochure and was advised to total knee replacement but due to being diabetic he could not have surgery at the time.  Pain in both feet -he complains of pain and discomfort in his bilateral feet and intermittent  swelling.  No synovitis was noted.  Bilateral pes planus and hammertoes were noted.  Plan: XR Foot 2 Views Right, XR Foot 2 Views Left.  X-rays are consistent with osteoarthritis.  DDD (degenerative disc disease), lumbar-he complains of severe lower back pain.  I reviewed x-rays of the lumbar spine from November 25, 2022 which showed mild spondylosis and facet joint arthropathy.  He is going to physical therapy.  Stage 3b chronic kidney disease (HCC)-creatinine  was 1.83 on October 23, 2022.  He is awaiting nephrology appointment.  Essential hypertension, benign-blood pressure was 126/79 today.  Other medical problems are listed as follows:  Type 2 diabetes mellitus with diabetic neuropathy, unspecified whether long term insulin use ([place])  Hyperlipidemia LDL goal <100-he is on Crestor.  Other fatigue - Plan: Glucose 6 phosphate dehydrogenase  Macrocytic anemia  B12 deficiency  Vitamin D deficiency  Seasonal allergies  Anxiety and depression-he is on Prozac.  BMI 29.0-29.9,adult  Orders: Orders Placed This Encounter  Procedures   XR Hand 2 View Right   XR Hand 2 View Left   XR Foot 2 Views Right   XR Foot 2 Views Left   Sedimentation rate   Cyclic citrul peptide antibody, IgG   Glucose 6 phosphate dehydrogenase   Uric acid   No orders of the defined types were placed in this encounter.   Follow-Up Instructions: Return for +RF, joint pain.   Bo Merino, MD  Note - This record has been created using Editor, commissioning.  Chart creation errors have been sought, but may not always  have been located. Such creation errors do not reflect on  the standard of medical care.

## 2022-12-29 ENCOUNTER — Ambulatory Visit (HOSPITAL_COMMUNITY): Payer: Medicare HMO | Admitting: Physical Therapy

## 2022-12-29 DIAGNOSIS — M170 Bilateral primary osteoarthritis of knee: Secondary | ICD-10-CM | POA: Diagnosis not present

## 2022-12-29 DIAGNOSIS — M2550 Pain in unspecified joint: Secondary | ICD-10-CM | POA: Diagnosis not present

## 2022-12-29 DIAGNOSIS — M79642 Pain in left hand: Secondary | ICD-10-CM | POA: Diagnosis not present

## 2022-12-29 DIAGNOSIS — M5459 Other low back pain: Secondary | ICD-10-CM

## 2022-12-29 DIAGNOSIS — R768 Other specified abnormal immunological findings in serum: Secondary | ICD-10-CM | POA: Diagnosis not present

## 2022-12-29 DIAGNOSIS — M6281 Muscle weakness (generalized): Secondary | ICD-10-CM | POA: Diagnosis not present

## 2022-12-29 DIAGNOSIS — R29898 Other symptoms and signs involving the musculoskeletal system: Secondary | ICD-10-CM | POA: Diagnosis not present

## 2022-12-29 DIAGNOSIS — M79641 Pain in right hand: Secondary | ICD-10-CM | POA: Diagnosis not present

## 2022-12-29 DIAGNOSIS — R2689 Other abnormalities of gait and mobility: Secondary | ICD-10-CM | POA: Diagnosis not present

## 2022-12-29 NOTE — Therapy (Signed)
OUTPATIENT PHYSICAL THERAPY THORACOLUMBAR TREATMENT   Patient Name: Norman Peters MRN: [id number] DOB:1956/07/03, 67 y.o., male Today's Date: 12/29/2022  END OF SESSION:  PT End of Session - 12/29/22 0941     Visit Number 2    Number of Visits 12    Date for PT Re-Evaluation 01/26/23    Authorization Type Primary [employer]; Secondary Medicaid    Progress Note Due on Visit 10    PT Start Time 0945    PT Stop Time 1025    PT Time Calculation (min) 40 min    Activity Tolerance Patient tolerated treatment well    Behavior During Therapy [facility] for tasks assessed/performed             Past Medical History:  Diagnosis Date   Acute left-sided low back pain with left-sided sciatica 11/04/2020   Anxiety    Arthritis    Depression 2001   Diabetes mellitus 2010   GERD (gastroesophageal reflux disease)    Hyperlipidemia    Hypertension 2005   Past Surgical History:  Procedure Laterality Date   COLONOSCOPY N/A 07/07/2016   Procedure: COLONOSCOPY;  Surgeon: Rogene Houston, MD;  [facility]: [facility];  Service: Endoscopy;  Laterality: N/A;  1030   FINGER SURGERY Left 1974   fifth , trauma on job, had to replace/repair finger   HERNIA REPAIR  [id number]   x3, umbilical and bilateral hernia   Patient Active Problem List   Diagnosis Date Noted   Encounter for chronic pain management 07/12/2022   Back pain 06/21/2022   B12 deficiency 07/30/2021   Macrocytic anemia 07/22/2021   Chronic kidney disease 07/10/2021   Type 2 diabetes mellitus with diabetic neuropathy, unspecified ([place]) 07/06/2021   Seasonal allergies 09/12/2020   Osteoarthritis of right knee 09/02/2020   type 2 dia with ckd 02/05/2020   Annual visit for general adult medical examination with abnormal findings 01/11/2020   Vitamin D deficiency 01/11/2020   Obesity (BMI 30.0-34.9) 09/03/2019   Dental caries 09/03/2019   Chronic elbow pain, left 06/23/2019   Hyperlipidemia LDL goal <100 05/29/2017   Pain in joint involving  multiple sites 02/12/2016   Essential hypertension, benign 02/10/2016   Anxiety and depression 02/10/2016    PCP: Tula Nakayama MD  REFERRING PROVIDER: Carole Civil, MD  REFERRING DIAG: M54.50,G89.29 (ICD-10-CM) - Chronic bilateral low back pain without sciatica  Rationale for Evaluation and Treatment: Rehabilitation  THERAPY DIAG:  Other low back pain  ONSET DATE: 2-3 months  SUBJECTIVE:  SUBJECTIVE STATEMENT: Reports compliance with HEP, "its ok, hurt a little but its coming along"    Eval: Patient has had back pain and muscle relaxer's have been helping. Symptoms have been there for about 2-3 months but has been getting worse. Has been trying to do a little bit of work around the house but it must have bothered his back. Been doing old HEP some but hasn't been doing anything from before since its been so bad.   PERTINENT HISTORY:  Chronic LBP, DM, HLD, anxiety, depression  PAIN:  Are you having pain? Yes: NPRS scale: 4-5/10 Pain location: low back Pain description: stabbing, pressure Aggravating factors: sit to stand/transfers; sometimes bed mobility Relieving factors: changing positions  PRECAUTIONS: None  WEIGHT BEARING RESTRICTIONS: No  FALLS:  Has patient fallen in last 6 months? No   OCCUPATION: Retired  PLOF: Independent  PATIENT GOALS: back to be feeling better  NEXT MD VISIT: unsure  OBJECTIVE:   PATIENT SURVEYS: FOTO 55% function  SCREENING FOR RED FLAGS: Bowel or bladder incontinence: No Spinal tumors: No Cauda equina syndrome: No Compression fracture: No Abdominal aneurysm: No  COGNITION: Overall cognitive status: Within functional limits for tasks assessed     SENSATION: WFL   POSTURE: rounded shoulders, forward head, decreased lumbar  lordosis, and decreased thoracic kyphosis  PALPATION: TTP lower lumbar paraspinals; hypomobile lower thoracic and lumbar spine  LUMBAR ROM:   AROM eval  Flexion 25% limited  Extension 25% limited  Right lateral flexion 25% limited  Left lateral flexion 50% limited  Right rotation 25% limited  Left rotation 25% limited   (Blank rows = not tested)  LOWER EXTREMITY ROM:   lacking TKE bilaterally with LAQ  Active  Right eval Left eval  Hip flexion    Hip extension    Hip abduction    Hip adduction    Hip internal rotation    Hip external rotation    Knee flexion    Knee extension    Ankle dorsiflexion    Ankle plantarflexion    Ankle inversion    Ankle eversion     (Blank rows = not tested)  LOWER EXTREMITY MMT:    MMT Right eval Left eval  Hip flexion 4+ 4  Hip extension 4* 4*  Hip abduction 4- 4  Hip adduction    Hip internal rotation    Hip external rotation    Knee flexion 4 4  Knee extension 4+ 4+  Ankle dorsiflexion 5 5  Ankle plantarflexion    Ankle inversion    Ankle eversion     (Blank rows = not tested)*=pain  LUMBAR SPECIAL TESTS:  Slump test: Negative  FUNCTIONAL TESTS:  5 times sit to stand: 17.35 seconds without UE support, unsteady, relies LLE 2 minute walk test: 360 feet  GAIT: Distance walked: 360 feet Assistive device utilized: None Level of assistance: Complete Independence Comments: 2MWT, bilateral knee pain lacking TKE  TODAY'S TREATMENT:  DATE:  12/29/22 POE 2 x 30 second holds  Press up 1 x 10   Ab brace 10 x 5" Ab march 2 x 10 SLR 2 x 10  Bridge 2 x 10   Sidelying hip abduction 2 x 10   12/15/22 POE 2 x 30 second holds  Press up 1 x 10  LAQ  1 x 10 5 second holds   PATIENT EDUCATION:  Education details: Patient educated on exam findings, POC, scope of PT, HEP, and dosing of exercises  . Person educated: Patient Education method: Explanation, Demonstration, and Handouts Education comprehension: verbalized understanding, returned demonstration, verbal cues required, and tactile cues required  HOME EXERCISE PROGRAM: Access Code: [email or web address] URL: [email or web address]  12/29/22 - Supine Transversus Abdominis Bracing - Hands on Stomach  - 2 x daily - 7 x weekly - 1 sets - 10 reps - 5 second hold - Supine March  - 2 x daily - 7 x weekly - 2 sets - 10 reps - Supine Straight Leg Raises  - 2 x daily - 7 x weekly - 2 sets - 10 reps - Sidelying Hip Abduction  - 2 x daily - 7 x weekly - 2 sets - 10 reps - Supine Bridge  - 2 x daily - 7 x weekly - 2 sets - 10 reps - 5 second hold   Date: 12/15/2022 - Prone Press Up On Elbows  - 3-5 x daily - 7 x weekly - 3 reps - 30 second hold - Prone Press Up  - 3-5 x daily - 7 x weekly - 2 sets - 10 reps - Seated Long Arc Quad  - 3 x daily - 7 x weekly - 2 sets - 10 reps - 5 second hold  ASSESSMENT:  CLINICAL IMPRESSION: Initiated there ex. Patient noting slight pain reduction in lumbar following prone on elbows and press ups progression. Progressed LE and core strengthening activity. Patient educated on purpose and function of all added exercise. Patient required verbal cues for proper form and LE placement with leg raise and side leg rases. Added to HEP and issued handout. Patient will continue to benefit from skilled therapy services to reduce remaining deficits and improve functional ability.    OBJECTIVE IMPAIRMENTS: Abnormal gait, decreased activity tolerance, decreased balance, decreased endurance, decreased mobility, difficulty walking, decreased ROM, decreased strength, hypomobility, increased muscle spasms, impaired flexibility, improper body mechanics, postural dysfunction, and pain.   ACTIVITY LIMITATIONS: carrying, lifting, bending, standing, squatting, stairs, transfers, locomotion level, and caring for  others  PARTICIPATION LIMITATIONS: meal prep, cleaning, laundry, shopping, community activity, and yard work  PERSONAL FACTORS: Fitness, Time since onset of injury/illness/exacerbation, and 3+ comorbidities: Chronic LBP, DM, HLD, anxiety, depression  are also affecting patient's functional outcome.   REHAB POTENTIAL: Good  CLINICAL DECISION MAKING: Stable/uncomplicated  EVALUATION COMPLEXITY: Low   GOALS: Goals reviewed with patient? Yes  SHORT TERM GOALS: Target date: 01/05/2023    Patient will be independent with HEP in order to improve functional outcomes. Baseline:  Goal status: INITIAL  2.  Patient will report at least 25% improvement in symptoms for improved quality of life. Baseline:  Goal status: INITIAL    LONG TERM GOALS: Target date: 01/26/2023    Patient will report at least 75% improvement in symptoms for improved quality of life. Baseline:  Goal status: INITIAL  2.  Patient will improve FOTO score by at least 7 points in order to indicate improved tolerance to activity. Baseline: 55% function Goal status: INITIAL  3.  Patient will demonstrate at least 25% improvement in lumbar ROM in all restricted planes for improved ability to move trunk while completing chores. Baseline: see above Goal status: INITIAL  4.  Patient will be able to ambulate at least 410 feet in 2MWT in order to demonstrate improved tolerance to activity. Baseline: 360 feet Goal status: INITIAL  5.  Patient will demonstrate grade of 5/5 MMT grade in all tested musculature as evidence of improved strength to assist with stair ambulation and gait.   Baseline: see above Goal status: INITIAL  6.  Patient will be able to complete 5x STS in under 11.4 seconds in order to demo improved functional strength. Baseline: 17.35 seconds Goal status: INITIAL   PLAN:  PT FREQUENCY: 2x/week  PT DURATION: 6 weeks  PLANNED INTERVENTIONS: Therapeutic exercises, Therapeutic activity,  Neuromuscular re-education, Balance training, Gait training, Patient/Family education, Joint manipulation, Joint mobilization, Stair training, Orthotic/Fit training, DME instructions, Aquatic Therapy, Dry Needling, Electrical stimulation, Spinal manipulation, Spinal mobilization, Cryotherapy, Moist heat, Compression bandaging, scar mobilization, Splintting, Taping, Traction, Ultrasound, Ionotophoresis 28m/ml Dexamethasone, and Manual therapy  PLAN FOR NEXT SESSION:continue with extension if progressing; core strength and glute strength, postural strengthening; add general knee strength as able  9:42 AM, 12/29/22 CJosue HectorPT DPT  [facility] ([phone]

## 2022-12-31 ENCOUNTER — Ambulatory Visit (HOSPITAL_COMMUNITY): Payer: Medicare HMO

## 2022-12-31 DIAGNOSIS — M79642 Pain in left hand: Secondary | ICD-10-CM | POA: Diagnosis not present

## 2022-12-31 DIAGNOSIS — R768 Other specified abnormal immunological findings in serum: Secondary | ICD-10-CM | POA: Diagnosis not present

## 2022-12-31 DIAGNOSIS — R29898 Other symptoms and signs involving the musculoskeletal system: Secondary | ICD-10-CM | POA: Diagnosis not present

## 2022-12-31 DIAGNOSIS — M5459 Other low back pain: Secondary | ICD-10-CM | POA: Diagnosis not present

## 2022-12-31 DIAGNOSIS — M79641 Pain in right hand: Secondary | ICD-10-CM | POA: Diagnosis not present

## 2022-12-31 DIAGNOSIS — R2689 Other abnormalities of gait and mobility: Secondary | ICD-10-CM | POA: Diagnosis not present

## 2022-12-31 DIAGNOSIS — M2550 Pain in unspecified joint: Secondary | ICD-10-CM | POA: Diagnosis not present

## 2022-12-31 DIAGNOSIS — M6281 Muscle weakness (generalized): Secondary | ICD-10-CM

## 2022-12-31 DIAGNOSIS — M170 Bilateral primary osteoarthritis of knee: Secondary | ICD-10-CM | POA: Diagnosis not present

## 2022-12-31 NOTE — Therapy (Signed)
OUTPATIENT PHYSICAL THERAPY THORACOLUMBAR TREATMENT   Patient Name: Norman Peters MRN: [id number] DOB:May 05, 1956, 67 y.o., male Today's Date: 12/31/2022  END OF SESSION:  PT End of Session - 12/31/22 1627     Visit Number 3    Number of Visits 12    Date for PT Re-Evaluation 01/26/23    Authorization Type Primary [employer]; Secondary Medicaid    Authorization Time Period 12/15/22-01/26/23    Authorization - Visit Number 3    Authorization - Number of Visits 12    Progress Note Due on Visit 10    PT Start Time 1520    PT Stop Time 1600    PT Time Calculation (min) 40 min    Activity Tolerance Patient tolerated treatment well    Behavior During Therapy [facility] for tasks assessed/performed            Past Medical History:  Diagnosis Date   Acute left-sided low back pain with left-sided sciatica 11/04/2020   Anxiety    Arthritis    Depression 2001   Diabetes mellitus 2010   GERD (gastroesophageal reflux disease)    Hyperlipidemia    Hypertension 2005   Past Surgical History:  Procedure Laterality Date   COLONOSCOPY N/A 07/07/2016   Procedure: COLONOSCOPY;  Surgeon: Rogene Houston, MD;  [facility];  Service: Endoscopy;  Laterality: N/A;  1030   FINGER SURGERY Left 1974   fifth , trauma on job, had to replace/repair finger   HERNIA REPAIR  [id number]   x3, umbilical and bilateral hernia   Patient Active Problem List   Diagnosis Date Noted   Encounter for chronic pain management 07/12/2022   Back pain 06/21/2022   B12 deficiency 07/30/2021   Macrocytic anemia 07/22/2021   Chronic kidney disease 07/10/2021   Type 2 diabetes mellitus with diabetic neuropathy, unspecified ([place]) 07/06/2021   Seasonal allergies 09/12/2020   Osteoarthritis of right knee 09/02/2020   type 2 dia with ckd 02/05/2020   Annual visit for general adult medical examination with abnormal findings 01/11/2020   Vitamin D deficiency 01/11/2020   Obesity (BMI 30.0-34.9) 09/03/2019   Dental caries  09/03/2019   Chronic elbow pain, left 06/23/2019   Hyperlipidemia LDL goal <100 05/29/2017   Pain in joint involving multiple sites 02/12/2016   Essential hypertension, benign 02/10/2016   Anxiety and depression 02/10/2016    PCP: Tula Nakayama MD  REFERRING PROVIDER: Carole Civil, MD  REFERRING DIAG: M54.50,G89.29 (ICD-10-CM) - Chronic bilateral low back pain without sciatica  Rationale for Evaluation and Treatment: Rehabilitation  THERAPY DIAG:  Other low back pain  Muscle weakness (generalized)  ONSET DATE: 2-3 months  SUBJECTIVE:  SUBJECTIVE STATEMENT: Patient reports that he doesn't have pain on his legs but c/o low back pain = 3/10.  Eval: Patient has had back pain and muscle relaxer's have been helping. Symptoms have been there for about 2-3 months but has been getting worse. Has been trying to do a little bit of work around the house but it must have bothered his back. Been doing old HEP some but hasn't been doing anything from before since its been so bad.   PERTINENT HISTORY:  Chronic LBP, DM, HLD, anxiety, depression  PAIN:  Are you having pain? Yes: NPRS scale: 4-5/10 Pain location: low back Pain description: stabbing, pressure Aggravating factors: sit to stand/transfers; sometimes bed mobility Relieving factors: changing positions  PRECAUTIONS: None  WEIGHT BEARING RESTRICTIONS: No  FALLS:  Has patient fallen in last 6 months? No   OCCUPATION: Retired  PLOF: Independent  PATIENT GOALS: back to be feeling better  NEXT MD VISIT: unsure  OBJECTIVE:   PATIENT SURVEYS: FOTO 55% function  SCREENING FOR RED FLAGS: Bowel or bladder incontinence: No Spinal tumors: No Cauda equina syndrome: No Compression fracture: No Abdominal aneurysm:  No  COGNITION: Overall cognitive status: Within functional limits for tasks assessed     SENSATION: WFL   POSTURE: rounded shoulders, forward head, decreased lumbar lordosis, and decreased thoracic kyphosis  PALPATION: TTP lower lumbar paraspinals; hypomobile lower thoracic and lumbar spine  LUMBAR ROM:   AROM eval  Flexion 25% limited  Extension 25% limited  Right lateral flexion 25% limited  Left lateral flexion 50% limited  Right rotation 25% limited  Left rotation 25% limited   (Blank rows = not tested)  LOWER EXTREMITY ROM:   lacking TKE bilaterally with LAQ  Active  Right eval Left eval  Hip flexion    Hip extension    Hip abduction    Hip adduction    Hip internal rotation    Hip external rotation    Knee flexion    Knee extension    Ankle dorsiflexion    Ankle plantarflexion    Ankle inversion    Ankle eversion     (Blank rows = not tested)  LOWER EXTREMITY MMT:    MMT Right eval Left eval  Hip flexion 4+ 4  Hip extension 4* 4*  Hip abduction 4- 4  Hip adduction    Hip internal rotation    Hip external rotation    Knee flexion 4 4  Knee extension 4+ 4+  Ankle dorsiflexion 5 5  Ankle plantarflexion    Ankle inversion    Ankle eversion     (Blank rows = not tested)*=pain  LUMBAR SPECIAL TESTS:  Slump test: Negative  FUNCTIONAL TESTS:  5 times sit to stand: 17.35 seconds without UE support, unsteady, relies LLE 2 minute walk test: 360 feet  GAIT: Distance walked: 360 feet Assistive device utilized: None Level of assistance: Complete Independence Comments: 2MWT, bilateral knee pain lacking TKE  TODAY'S TREATMENT:  DATE:  12/31/22 Seated:  Hamstring stretch x 30" x 3  Abdominal isometrics with physioball x 3" x 10 x 2  Trunk flex stretch 3 ways with physioball x 30" on each  Hip hinges with a dowel x  10 Supine:  Piriformis stretch x 30" x 3  LTR x 1'  DL bridging x 3" x 10 x 2 Prone  POE x 30" x 2  Press up x 3" x 10  12/29/22 POE 2 x 30 second holds  Press up 1 x 10   Ab brace 10 x 5" Ab march 2 x 10 SLR 2 x 10  Bridge 2 x 10   Sidelying hip abduction 2 x 10   12/15/22 POE 2 x 30 second holds  Press up 1 x 10  LAQ  1 x 10 5 second holds   PATIENT EDUCATION:  Education details: Patient educated on exam findings, POC, scope of PT, HEP, and dosing of exercises . Person educated: Patient Education method: Explanation, Demonstration, and Handouts Education comprehension: verbalized understanding, returned demonstration, verbal cues required, and tactile cues required  HOME EXERCISE PROGRAM: Access Code: [email or web address] URL: [email or web address]  12/29/22 - Supine Transversus Abdominis Bracing - Hands on Stomach  - 2 x daily - 7 x weekly - 1 sets - 10 reps - 5 second hold - Supine March  - 2 x daily - 7 x weekly - 2 sets - 10 reps - Supine Straight Leg Raises  - 2 x daily - 7 x weekly - 2 sets - 10 reps - Sidelying Hip Abduction  - 2 x daily - 7 x weekly - 2 sets - 10 reps - Supine Bridge  - 2 x daily - 7 x weekly - 2 sets - 10 reps - 5 second hold   Date: 12/15/2022 - Prone Press Up On Elbows  - 3-5 x daily - 7 x weekly - 3 reps - 30 second hold - Prone Press Up  - 3-5 x daily - 7 x weekly - 2 sets - 10 reps - Seated Long Arc Quad  - 3 x daily - 7 x weekly - 2 sets - 10 reps - 5 second hold  ASSESSMENT:  CLINICAL IMPRESSION: Interventions today were geared towards core strengthening and trunk flexibility. Tolerated all activities without worsening of symptoms. Demonstrated appropriate levels of fatigue. Required slight amount of cueing to ensure correct execution of activity particularly with the hip hinges. Back pain decreased to 2/10. To date, skilled PT is required to address the impairments and improve function.    OBJECTIVE IMPAIRMENTS: Abnormal gait,  decreased activity tolerance, decreased balance, decreased endurance, decreased mobility, difficulty walking, decreased ROM, decreased strength, hypomobility, increased muscle spasms, impaired flexibility, improper body mechanics, postural dysfunction, and pain.   ACTIVITY LIMITATIONS: carrying, lifting, bending, standing, squatting, stairs, transfers, locomotion level, and caring for others  PARTICIPATION LIMITATIONS: meal prep, cleaning, laundry, shopping, community activity, and yard work  PERSONAL FACTORS: Fitness, Time since onset of injury/illness/exacerbation, and 3+ comorbidities: Chronic LBP, DM, HLD, anxiety, depression  are also affecting patient's functional outcome.   REHAB POTENTIAL: Good  CLINICAL DECISION MAKING: Stable/uncomplicated  EVALUATION COMPLEXITY: Low   GOALS: Goals reviewed with patient? Yes  SHORT TERM GOALS: Target date: 01/05/2023    Patient will be independent with HEP in order to improve functional outcomes. Baseline:  Goal status: INITIAL  2.  Patient will report at least 25% improvement in symptoms for improved quality of life. Baseline:  Goal status: INITIAL  LONG TERM GOALS: Target date: 01/26/2023    Patient will report at least 75% improvement in symptoms for improved quality of life. Baseline:  Goal status: INITIAL  2.  Patient will improve FOTO score by at least 7 points in order to indicate improved tolerance to activity. Baseline: 55% function Goal status: INITIAL  3.  Patient will demonstrate at least 25% improvement in lumbar ROM in all restricted planes for improved ability to move trunk while completing chores. Baseline: see above Goal status: INITIAL  4.  Patient will be able to ambulate at least 410 feet in 2MWT in order to demonstrate improved tolerance to activity. Baseline: 360 feet Goal status: INITIAL  5.  Patient will demonstrate grade of 5/5 MMT grade in all tested musculature as evidence of improved strength to  assist with stair ambulation and gait.   Baseline: see above Goal status: INITIAL  6.  Patient will be able to complete 5x STS in under 11.4 seconds in order to demo improved functional strength. Baseline: 17.35 seconds Goal status: INITIAL   PLAN:  PT FREQUENCY: 2x/week  PT DURATION: 6 weeks  PLANNED INTERVENTIONS: Therapeutic exercises, Therapeutic activity, Neuromuscular re-education, Balance training, Gait training, Patient/Family education, Joint manipulation, Joint mobilization, Stair training, Orthotic/Fit training, DME instructions, Aquatic Therapy, Dry Needling, Electrical stimulation, Spinal manipulation, Spinal mobilization, Cryotherapy, Moist heat, Compression bandaging, scar mobilization, Splintting, Taping, Traction, Ultrasound, Ionotophoresis '4mg'$ /ml Dexamethasone, and Manual therapy  PLAN FOR NEXT SESSION: Continue POC and may progress as tolerated with emphasis on core and trunk flexibility and strength.   4:33 PM, 12/31/22 Harvie Heck. Tangie Stay, PT, DPT, OCS Board-Certified Clinical Specialist in [place] # ([place]): [phone]

## 2023-01-04 ENCOUNTER — Encounter (HOSPITAL_COMMUNITY): Payer: Self-pay | Admitting: Physical Therapy

## 2023-01-04 ENCOUNTER — Ambulatory Visit (HOSPITAL_COMMUNITY): Payer: Medicare HMO | Admitting: Physical Therapy

## 2023-01-04 DIAGNOSIS — M79642 Pain in left hand: Secondary | ICD-10-CM | POA: Diagnosis not present

## 2023-01-04 DIAGNOSIS — M5459 Other low back pain: Secondary | ICD-10-CM

## 2023-01-04 DIAGNOSIS — R768 Other specified abnormal immunological findings in serum: Secondary | ICD-10-CM | POA: Diagnosis not present

## 2023-01-04 DIAGNOSIS — R2689 Other abnormalities of gait and mobility: Secondary | ICD-10-CM

## 2023-01-04 DIAGNOSIS — M2550 Pain in unspecified joint: Secondary | ICD-10-CM | POA: Diagnosis not present

## 2023-01-04 DIAGNOSIS — M79641 Pain in right hand: Secondary | ICD-10-CM | POA: Diagnosis not present

## 2023-01-04 DIAGNOSIS — R29898 Other symptoms and signs involving the musculoskeletal system: Secondary | ICD-10-CM | POA: Diagnosis not present

## 2023-01-04 DIAGNOSIS — M170 Bilateral primary osteoarthritis of knee: Secondary | ICD-10-CM | POA: Diagnosis not present

## 2023-01-04 DIAGNOSIS — M6281 Muscle weakness (generalized): Secondary | ICD-10-CM | POA: Diagnosis not present

## 2023-01-04 NOTE — Therapy (Signed)
OUTPATIENT PHYSICAL THERAPY THORACOLUMBAR TREATMENT   Patient Name: Norman Peters MRN: [id number] DOB:[birth year], 67 y.o., male Today's Date: 01/04/2023  END OF SESSION:  PT End of Session - 01/04/23 0729     Visit Number 4    Number of Visits 12    Date for PT Re-Evaluation 01/26/23    Authorization Type Primary [employer]; Secondary Medicaid    Authorization Time Period 12/15/22-01/26/23    Authorization - Visit Number 4    Authorization - Number of Visits 12    Progress Note Due on Visit 10    PT Start Time 0730    PT Stop Time 0810    PT Time Calculation (min) 40 min    Activity Tolerance Patient tolerated treatment well    Behavior During Therapy [facility] for tasks assessed/performed            Past Medical History:  Diagnosis Date   Acute left-sided low back pain with left-sided sciatica 11/04/2020   Anxiety    Arthritis    Depression 2001   Diabetes mellitus 2010   GERD (gastroesophageal reflux disease)    Hyperlipidemia    Hypertension 2005   Past Surgical History:  Procedure Laterality Date   COLONOSCOPY N/A 07/07/2016   Procedure: COLONOSCOPY;  Surgeon: Rogene Houston, MD;  [facility];  Service: Endoscopy;  Laterality: N/A;  1030   FINGER SURGERY Left 1974   fifth , trauma on job, had to replace/repair finger   HERNIA REPAIR  [id number]   x3, umbilical and bilateral hernia   Patient Active Problem List   Diagnosis Date Noted   Encounter for chronic pain management 07/12/2022   Back pain 06/21/2022   B12 deficiency 07/30/2021   Macrocytic anemia 07/22/2021   Chronic kidney disease 07/10/2021   Type 2 diabetes mellitus with diabetic neuropathy, unspecified ([place]) 07/06/2021   Seasonal allergies 09/12/2020   Osteoarthritis of right knee 09/02/2020   type 2 dia with ckd 02/05/2020   Annual visit for general adult medical examination with abnormal findings 01/11/2020   Vitamin D deficiency 01/11/2020   Obesity (BMI 30.0-34.9) 09/03/2019   Dental caries  09/03/2019   Chronic elbow pain, left 06/23/2019   Hyperlipidemia LDL goal <100 05/29/2017   Pain in joint involving multiple sites 02/12/2016   Essential hypertension, benign 02/10/2016   Anxiety and depression 02/10/2016    PCP: Tula Nakayama MD  REFERRING PROVIDER: Carole Civil, MD  REFERRING DIAG: M54.50,G89.29 (ICD-10-CM) - Chronic bilateral low back pain without sciatica  Rationale for Evaluation and Treatment: Rehabilitation  THERAPY DIAG:  Other low back pain  Muscle weakness (generalized)  Other abnormalities of gait and mobility  Other symptoms and signs involving the musculoskeletal system  ONSET DATE: 2-3 months  SUBJECTIVE:  SUBJECTIVE STATEMENT: Patient reports back has been a lot better. Still some trouble. Notes increased symptoms with weather.   Eval: Patient has had back pain and muscle relaxer's have been helping. Symptoms have been there for about 2-3 months but has been getting worse. Has been trying to do a little bit of work around the house but it must have bothered his back. Been doing old HEP some but hasn't been doing anything from before since its been so bad.   PERTINENT HISTORY:  Chronic LBP, DM, HLD, anxiety, depression  PAIN:  Are you having pain? Yes: NPRS scale: 3/10 Pain location: low back Pain description: stabbing, pressure Aggravating factors: sit to stand/transfers; sometimes bed mobility Relieving factors: changing positions  PRECAUTIONS: None  WEIGHT BEARING RESTRICTIONS: No  FALLS:  Has patient fallen in last 6 months? No   OCCUPATION: Retired  PLOF: Independent  PATIENT GOALS: back to be feeling better  NEXT MD VISIT: unsure  OBJECTIVE:   PATIENT SURVEYS: FOTO 55% function  SCREENING FOR RED FLAGS: Bowel or bladder  incontinence: No Spinal tumors: No Cauda equina syndrome: No Compression fracture: No Abdominal aneurysm: No  COGNITION: Overall cognitive status: Within functional limits for tasks assessed     SENSATION: WFL   POSTURE: rounded shoulders, forward head, decreased lumbar lordosis, and decreased thoracic kyphosis  PALPATION: TTP lower lumbar paraspinals; hypomobile lower thoracic and lumbar spine  LUMBAR ROM:   AROM eval  Flexion 25% limited  Extension 25% limited  Right lateral flexion 25% limited  Left lateral flexion 50% limited  Right rotation 25% limited  Left rotation 25% limited   (Blank rows = not tested)  LOWER EXTREMITY ROM:   lacking TKE bilaterally with LAQ  Active  Right eval Left eval  Hip flexion    Hip extension    Hip abduction    Hip adduction    Hip internal rotation    Hip external rotation    Knee flexion    Knee extension    Ankle dorsiflexion    Ankle plantarflexion    Ankle inversion    Ankle eversion     (Blank rows = not tested)  LOWER EXTREMITY MMT:    MMT Right eval Left eval  Hip flexion 4+ 4  Hip extension 4* 4*  Hip abduction 4- 4  Hip adduction    Hip internal rotation    Hip external rotation    Knee flexion 4 4  Knee extension 4+ 4+  Ankle dorsiflexion 5 5  Ankle plantarflexion    Ankle inversion    Ankle eversion     (Blank rows = not tested)*=pain  LUMBAR SPECIAL TESTS:  Slump test: Negative  FUNCTIONAL TESTS:  5 times sit to stand: 17.35 seconds without UE support, unsteady, relies LLE 2 minute walk test: 360 feet  GAIT: Distance walked: 360 feet Assistive device utilized: None Level of assistance: Complete Independence Comments: 2MWT, bilateral knee pain lacking TKE  TODAY'S TREATMENT:  DATE:  01/04/23 POE 2 x 30 second holds Press up 2 x 10  Prone hip extension 2 x 10  bilateral  LTR 20 x 5 second holds SLR 2 x 10 bilateral  Bridge 1 x 20  STS 1 x 10  Standing Row BTB 2 x 10   12/31/22 Seated:  Hamstring stretch x 30" x 3  Abdominal isometrics with physioball x 3" x 10 x 2  Trunk flex stretch 3 ways with physioball x 30" on each  Hip hinges with a dowel x 10 Supine:  Piriformis stretch x 30" x 3  LTR x 1'  DL bridging x 3" x 10 x 2 Prone  POE x 30" x 2  Press up x 3" x 10  12/29/22 POE 2 x 30 second holds  Press up 1 x 10   Ab brace 10 x 5" Ab march 2 x 10 SLR 2 x 10  Bridge 2 x 10   Sidelying hip abduction 2 x 10   12/15/22 POE 2 x 30 second holds  Press up 1 x 10  LAQ  1 x 10 5 second holds   PATIENT EDUCATION:  Education details: 01/04/23: HEP;  EVAL: Patient educated on exam findings, POC, scope of PT, HEP, and dosing of exercises . Person educated: Patient Education method: Explanation, Demonstration, and Handouts Education comprehension: verbalized understanding, returned demonstration, verbal cues required, and tactile cues required  HOME EXERCISE PROGRAM: Access Code: [email or web address] URL: [email or web address] 01/04/23 - Prone Hip Extension  - 1 x daily - 7 x weekly - 2 sets - 10 reps - Sit to Stand with Arms Crossed  - 1 x daily - 7 x weekly - 2 sets - 10 reps  12/29/22 - Supine Transversus Abdominis Bracing - Hands on Stomach  - 2 x daily - 7 x weekly - 1 sets - 10 reps - 5 second hold - Supine March  - 2 x daily - 7 x weekly - 2 sets - 10 reps - Supine Straight Leg Raises  - 2 x daily - 7 x weekly - 2 sets - 10 reps - Sidelying Hip Abduction  - 2 x daily - 7 x weekly - 2 sets - 10 reps - Supine Bridge  - 2 x daily - 7 x weekly - 2 sets - 10 reps - 5 second hold   Date: 12/15/2022 - Prone Press Up On Elbows  - 3-5 x daily - 7 x weekly - 3 reps - 30 second hold - Prone Press Up  - 3-5 x daily - 7 x weekly - 2 sets - 10 reps - Seated Long Arc Quad  - 3 x daily - 7 x weekly - 2 sets - 10 reps - 5 second  hold  ASSESSMENT:  CLINICAL IMPRESSION: Patient continues to progress since beginning PT with decreased symptoms. Patient educated on gradual increase in activity at home as symptoms allow. Continued with extension bias and patient tolerates increased reps of press ups. Progressing well with core and hip strengthening and spinal mobility. Some knee pain with STS but able to complete. Patient will continue to benefit from physical therapy in order to improve function and reduce impairment.    OBJECTIVE IMPAIRMENTS: Abnormal gait, decreased activity tolerance, decreased balance, decreased endurance, decreased mobility, difficulty walking, decreased ROM, decreased strength, hypomobility, increased muscle spasms, impaired flexibility, improper body mechanics, postural dysfunction, and pain.   ACTIVITY LIMITATIONS: carrying, lifting, bending, standing, squatting, stairs, transfers, locomotion level, and caring for others  PARTICIPATION LIMITATIONS: meal prep, cleaning, laundry, shopping, community activity, and yard work  PERSONAL FACTORS: Fitness, Time since onset of injury/illness/exacerbation, and 3+ comorbidities: Chronic LBP, DM, HLD, anxiety, depression  are also affecting patient's functional outcome.   REHAB POTENTIAL: Good  CLINICAL DECISION MAKING: Stable/uncomplicated  EVALUATION COMPLEXITY: Low   GOALS: Goals reviewed with patient? Yes  SHORT TERM GOALS: Target date: 01/05/2023    Patient will be independent with HEP in order to improve functional outcomes. Baseline:  Goal status: INITIAL  2.  Patient will report at least 25% improvement in symptoms for improved quality of life. Baseline:  Goal status: INITIAL    LONG TERM GOALS: Target date: 01/26/2023    Patient will report at least 75% improvement in symptoms for improved quality of life. Baseline:  Goal status: INITIAL  2.  Patient will improve FOTO score by at least 7 points in order to indicate improved  tolerance to activity. Baseline: 55% function Goal status: INITIAL  3.  Patient will demonstrate at least 25% improvement in lumbar ROM in all restricted planes for improved ability to move trunk while completing chores. Baseline: see above Goal status: INITIAL  4.  Patient will be able to ambulate at least 410 feet in 2MWT in order to demonstrate improved tolerance to activity. Baseline: 360 feet Goal status: INITIAL  5.  Patient will demonstrate grade of 5/5 MMT grade in all tested musculature as evidence of improved strength to assist with stair ambulation and gait.   Baseline: see above Goal status: INITIAL  6.  Patient will be able to complete 5x STS in under 11.4 seconds in order to demo improved functional strength. Baseline: 17.35 seconds Goal status: INITIAL   PLAN:  PT FREQUENCY: 2x/week  PT DURATION: 6 weeks  PLANNED INTERVENTIONS: Therapeutic exercises, Therapeutic activity, Neuromuscular re-education, Balance training, Gait training, Patient/Family education, Joint manipulation, Joint mobilization, Stair training, Orthotic/Fit training, DME instructions, Aquatic Therapy, Dry Needling, Electrical stimulation, Spinal manipulation, Spinal mobilization, Cryotherapy, Moist heat, Compression bandaging, scar mobilization, Splintting, Taping, Traction, Ultrasound, Ionotophoresis '4mg'$ /ml Dexamethasone, and Manual therapy  PLAN FOR NEXT SESSION: Continue POC and may progress as tolerated with emphasis on core and trunk flexibility and strength.   8:10 AM, 01/04/23 Mearl Latin PT, DPT Physical Therapist at [facility]

## 2023-01-06 ENCOUNTER — Ambulatory Visit: Payer: Medicare HMO

## 2023-01-06 ENCOUNTER — Ambulatory Visit (INDEPENDENT_AMBULATORY_CARE_PROVIDER_SITE_OTHER): Payer: Medicare HMO | Admitting: Rheumatology

## 2023-01-06 ENCOUNTER — Ambulatory Visit (INDEPENDENT_AMBULATORY_CARE_PROVIDER_SITE_OTHER): Payer: Medicare HMO

## 2023-01-06 ENCOUNTER — Encounter: Payer: Self-pay | Admitting: Rheumatology

## 2023-01-06 ENCOUNTER — Encounter: Payer: Self-pay | Admitting: Radiology

## 2023-01-06 ENCOUNTER — Ambulatory Visit: Payer: Medicare HMO | Attending: Rheumatology

## 2023-01-06 VITALS — BP 126/79 | HR 97 | Resp 16 | Ht 70.0 in | Wt 208.0 lb

## 2023-01-06 DIAGNOSIS — M79671 Pain in right foot: Secondary | ICD-10-CM

## 2023-01-06 DIAGNOSIS — M79672 Pain in left foot: Secondary | ICD-10-CM

## 2023-01-06 DIAGNOSIS — F32A Depression, unspecified: Secondary | ICD-10-CM

## 2023-01-06 DIAGNOSIS — M170 Bilateral primary osteoarthritis of knee: Secondary | ICD-10-CM | POA: Diagnosis not present

## 2023-01-06 DIAGNOSIS — M5136 Other intervertebral disc degeneration, lumbar region: Secondary | ICD-10-CM

## 2023-01-06 DIAGNOSIS — M2550 Pain in unspecified joint: Secondary | ICD-10-CM | POA: Diagnosis not present

## 2023-01-06 DIAGNOSIS — R5383 Other fatigue: Secondary | ICD-10-CM

## 2023-01-06 DIAGNOSIS — I10 Essential (primary) hypertension: Secondary | ICD-10-CM | POA: Diagnosis not present

## 2023-01-06 DIAGNOSIS — N1832 Chronic kidney disease, stage 3b: Secondary | ICD-10-CM | POA: Diagnosis not present

## 2023-01-06 DIAGNOSIS — E1140 Type 2 diabetes mellitus with diabetic neuropathy, unspecified: Secondary | ICD-10-CM | POA: Diagnosis not present

## 2023-01-06 DIAGNOSIS — M79642 Pain in left hand: Secondary | ICD-10-CM

## 2023-01-06 DIAGNOSIS — E785 Hyperlipidemia, unspecified: Secondary | ICD-10-CM

## 2023-01-06 DIAGNOSIS — M51369 Other intervertebral disc degeneration, lumbar region without mention of lumbar back pain or lower extremity pain: Secondary | ICD-10-CM

## 2023-01-06 DIAGNOSIS — M79641 Pain in right hand: Secondary | ICD-10-CM

## 2023-01-06 DIAGNOSIS — E538 Deficiency of other specified B group vitamins: Secondary | ICD-10-CM

## 2023-01-06 DIAGNOSIS — R768 Other specified abnormal immunological findings in serum: Secondary | ICD-10-CM | POA: Diagnosis not present

## 2023-01-06 DIAGNOSIS — F419 Anxiety disorder, unspecified: Secondary | ICD-10-CM

## 2023-01-06 DIAGNOSIS — E559 Vitamin D deficiency, unspecified: Secondary | ICD-10-CM

## 2023-01-06 DIAGNOSIS — J302 Other seasonal allergic rhinitis: Secondary | ICD-10-CM

## 2023-01-06 DIAGNOSIS — E669 Obesity, unspecified: Secondary | ICD-10-CM

## 2023-01-06 DIAGNOSIS — Z6829 Body mass index (BMI) 29.0-29.9, adult: Secondary | ICD-10-CM

## 2023-01-06 DIAGNOSIS — R7689 Other specified abnormal immunological findings in serum: Secondary | ICD-10-CM

## 2023-01-06 DIAGNOSIS — D539 Nutritional anemia, unspecified: Secondary | ICD-10-CM

## 2023-01-07 ENCOUNTER — Ambulatory Visit (INDEPENDENT_AMBULATORY_CARE_PROVIDER_SITE_OTHER): Payer: Medicare HMO | Admitting: Orthopedic Surgery

## 2023-01-07 ENCOUNTER — Encounter: Payer: Self-pay | Admitting: Orthopedic Surgery

## 2023-01-07 DIAGNOSIS — M170 Bilateral primary osteoarthritis of knee: Secondary | ICD-10-CM

## 2023-01-07 DIAGNOSIS — M5136 Other intervertebral disc degeneration, lumbar region: Secondary | ICD-10-CM | POA: Diagnosis not present

## 2023-01-07 NOTE — Progress Notes (Signed)
Chief Complaint  Patient presents with   Back Pain    S/P PHYSICAL THERAPY FOR BACK PAIN   Knee Problem    CONSIDERING TKA LEFT KNEE    Norman Peters is 67 years old he is in the process of trying to get to the point where he can have a left total knee arthroplasty as he is still having disabling left knee pain  In the process of his workup it was discovered that he had degenerative disc disease and lower back pain and is undergoing physical therapy with some improvement but persistent symptoms.  He denies any neurologic symptoms leg pain radicular symptoms at this time there are no bowel or bladder dysfunction issues  It is also noted that he has seen rheumatology and he apparently has a positive test  He will follow-up with rheumatology to determine proper course of treatment regarding that and therefore we will hold off on any knee surgery until we get the input of rheumatology in case we need to do anything different during the surgery  As far as his back goes he will continue with therapy  Most recent lumbar spine film DDD (degenerative disc disease), lumbar-he complains of severe lower back pain.  I reviewed x-rays of the lumbar spine from November 25, 2022 which showed mild spondylosis and facet joint arthropathy.  He is going to physical therapy.   Kidney function Stage 3b chronic kidney disease (HCC)-creatinine was 1.83 on October 23, 2022.  He is awaiting nephrology appointment.   Essential hypertension, benign-blood pressure was 126/79 today.   Other medical problems are listed as follows:   Type 2 diabetes mellitus with diabetic neuropathy, unspecified whether long term insulin use ([place])   Diagnosis  Encounter Diagnoses  Name Primary?   Primary osteoarthritis of both knees Yes   DDD (degenerative disc disease), lumbar      Plan Once I read his rheumatology note I will call him and let him know what the next course of action is.  Hopefully nothing will need to be  done other than proceed with scheduling his left total knee arthroplasty

## 2023-01-09 LAB — SEDIMENTATION RATE: Sed Rate: 11 mm/h (ref 0–20)

## 2023-01-09 LAB — CYCLIC CITRUL PEPTIDE ANTIBODY, IGG: Cyclic Citrullin Peptide Ab: >250 UNITS — ABNORMAL HIGH

## 2023-01-09 LAB — GLUCOSE 6 PHOSPHATE DEHYDROGENASE: G-6PDH: 12.5 U/g Hgb (ref 7.0–20.5)

## 2023-01-09 LAB — URIC ACID: Uric Acid, Serum: 8.8 mg/dL — ABNORMAL HIGH (ref 4.0–8.0)

## 2023-01-09 NOTE — Progress Notes (Signed)
He had positive rheumatoid factor in the past.  Anti-CCP is also positive.  These findings are consistent with rheumatoid arthritis.  Patient has been experiencing intermittent joint swelling.  Uric acid is elevated which could cause gout.  I will consider starting him on hydroxychloroquine at the follow-up visit.  If there are any cancellations we can start him on hydroxychloroquine sooner.  I will discuss results at the follow-up visit.

## 2023-01-09 NOTE — Therapy (Incomplete)
OUTPATIENT PHYSICAL THERAPY THORACOLUMBAR TREATMENT   Patient Name: Norman Peters MRN: [id number] DOB:1956-07-19, 67 y.o., male Today's Date: 01/09/2023  END OF SESSION:   Past Medical History:  Diagnosis Date   Acute left-sided low back pain with left-sided sciatica 11/04/2020   Anxiety    Arthritis    Depression 2001   Diabetes mellitus 2010   GERD (gastroesophageal reflux disease)    Hyperlipidemia    Hypertension 2005   Past Surgical History:  Procedure Laterality Date   COLONOSCOPY N/A 07/07/2016   Procedure: COLONOSCOPY;  Surgeon: Rogene Houston, MD;  [facility];  Service: Endoscopy;  Laterality: N/A;  1030   FINGER SURGERY Left 1974   fifth , trauma on job, had to replace/repair finger   HERNIA REPAIR  [id number]   x3, umbilical and bilateral hernia   Patient Active Problem List   Diagnosis Date Noted   Encounter for chronic pain management 07/12/2022   Back pain 06/21/2022   B12 deficiency 07/30/2021   Macrocytic anemia 07/22/2021   Chronic kidney disease 07/10/2021   Type 2 diabetes mellitus with diabetic neuropathy, unspecified ([place]) 07/06/2021   Seasonal allergies 09/12/2020   Osteoarthritis of right knee 09/02/2020   type 2 dia with ckd 02/05/2020   Annual visit for general adult medical examination with abnormal findings 01/11/2020   Vitamin D deficiency 01/11/2020   Obesity (BMI 30.0-34.9) 09/03/2019   Dental caries 09/03/2019   Chronic elbow pain, left 06/23/2019   Hyperlipidemia LDL goal <100 05/29/2017   Pain in joint involving multiple sites 02/12/2016   Essential hypertension, benign 02/10/2016   Anxiety and depression 02/10/2016    PCP: Tula Nakayama MD  REFERRING PROVIDER: Carole Civil, MD  REFERRING DIAG: M54.50,G89.29 (ICD-10-CM) - Chronic bilateral low back pain without sciatica  Rationale for Evaluation and Treatment: Rehabilitation  THERAPY DIAG:  No diagnosis found.  ONSET DATE: 2-3 months  SUBJECTIVE:                                                                                                                                                                                            SUBJECTIVE STATEMENT: ***  Eval: Patient has had back pain and muscle relaxer's have been helping. Symptoms have been there for about 2-3 months but has been getting worse. Has been trying to do a little bit of work around the house but it must have bothered his back. Been doing old HEP some but hasn't been doing anything from before since its been so bad.   PERTINENT HISTORY:  Chronic LBP, DM, HLD, anxiety, depression  PAIN:  Are you having pain? Yes: NPRS  scale: 3/10 Pain location: low back Pain description: stabbing, pressure Aggravating factors: sit to stand/transfers; sometimes bed mobility Relieving factors: changing positions  PRECAUTIONS: None  WEIGHT BEARING RESTRICTIONS: No  FALLS:  Has patient fallen in last 6 months? No   OCCUPATION: Retired  PLOF: Independent  PATIENT GOALS: back to be feeling better  NEXT MD VISIT: unsure  OBJECTIVE:   PATIENT SURVEYS: FOTO 55% function  SCREENING FOR RED FLAGS: Bowel or bladder incontinence: No Spinal tumors: No Cauda equina syndrome: No Compression fracture: No Abdominal aneurysm: No  COGNITION: Overall cognitive status: Within functional limits for tasks assessed     SENSATION: WFL   POSTURE: rounded shoulders, forward head, decreased lumbar lordosis, and decreased thoracic kyphosis  PALPATION: TTP lower lumbar paraspinals; hypomobile lower thoracic and lumbar spine  LUMBAR ROM:   AROM eval  Flexion 25% limited  Extension 25% limited  Right lateral flexion 25% limited  Left lateral flexion 50% limited  Right rotation 25% limited  Left rotation 25% limited   (Blank rows = not tested)  LOWER EXTREMITY ROM:   lacking TKE bilaterally with LAQ  Active  Right eval Left eval  Hip flexion    Hip extension    Hip abduction     Hip adduction    Hip internal rotation    Hip external rotation    Knee flexion    Knee extension    Ankle dorsiflexion    Ankle plantarflexion    Ankle inversion    Ankle eversion     (Blank rows = not tested)  LOWER EXTREMITY MMT:    MMT Right eval Left eval  Hip flexion 4+ 4  Hip extension 4* 4*  Hip abduction 4- 4  Hip adduction    Hip internal rotation    Hip external rotation    Knee flexion 4 4  Knee extension 4+ 4+  Ankle dorsiflexion 5 5  Ankle plantarflexion    Ankle inversion    Ankle eversion     (Blank rows = not tested)*=pain  LUMBAR SPECIAL TESTS:  Slump test: Negative  FUNCTIONAL TESTS:  5 times sit to stand: 17.35 seconds without UE support, unsteady, relies LLE 2 minute walk test: 360 feet  GAIT: Distance walked: 360 feet Assistive device utilized: None Level of assistance: Complete Independence Comments: 2MWT, bilateral knee pain lacking TKE  TODAY'S TREATMENT:                                                                                                                              DATE:  01/10/2023 ***  01/04/23 POE 2 x 30 second holds Press up 2 x 10  Prone hip extension 2 x 10 bilateral  LTR 20 x 5 second holds SLR 2 x 10 bilateral  Bridge 1 x 20  STS 1 x 10  Standing Row BTB 2 x 10   12/31/22 Seated:  Hamstring stretch x 30" x 3  Abdominal isometrics with physioball  x 3" x 10 x 2  Trunk flex stretch 3 ways with physioball x 30" on each  Hip hinges with a dowel x 10 Supine:  Piriformis stretch x 30" x 3  LTR x 1'  DL bridging x 3" x 10 x 2 Prone  POE x 30" x 2  Press up x 3" x 10  12/29/22 POE 2 x 30 second holds  Press up 1 x 10   Ab brace 10 x 5" Ab march 2 x 10 SLR 2 x 10  Bridge 2 x 10   Sidelying hip abduction 2 x 10   12/15/22 POE 2 x 30 second holds  Press up 1 x 10  LAQ  1 x 10 5 second holds   PATIENT EDUCATION:  Education details: 01/04/23: HEP;  EVAL: Patient educated on exam findings, POC, scope of  PT, HEP, and dosing of exercises . Person educated: Patient Education method: Explanation, Demonstration, and Handouts Education comprehension: verbalized understanding, returned demonstration, verbal cues required, and tactile cues required  HOME EXERCISE PROGRAM: Access Code: [email or web address] URL: [email or web address] 01/04/23 - Prone Hip Extension  - 1 x daily - 7 x weekly - 2 sets - 10 reps - Sit to Stand with Arms Crossed  - 1 x daily - 7 x weekly - 2 sets - 10 reps  12/29/22 - Supine Transversus Abdominis Bracing - Hands on Stomach  - 2 x daily - 7 x weekly - 1 sets - 10 reps - 5 second hold - Supine March  - 2 x daily - 7 x weekly - 2 sets - 10 reps - Supine Straight Leg Raises  - 2 x daily - 7 x weekly - 2 sets - 10 reps - Sidelying Hip Abduction  - 2 x daily - 7 x weekly - 2 sets - 10 reps - Supine Bridge  - 2 x daily - 7 x weekly - 2 sets - 10 reps - 5 second hold   Date: 12/15/2022 - Prone Press Up On Elbows  - 3-5 x daily - 7 x weekly - 3 reps - 30 second hold - Prone Press Up  - 3-5 x daily - 7 x weekly - 2 sets - 10 reps - Seated Long Arc Quad  - 3 x daily - 7 x weekly - 2 sets - 10 reps - 5 second hold  ASSESSMENT:  CLINICAL IMPRESSION: ***. Patient will continue to benefit from physical therapy in order to improve function and reduce impairment.    OBJECTIVE IMPAIRMENTS: Abnormal gait, decreased activity tolerance, decreased balance, decreased endurance, decreased mobility, difficulty walking, decreased ROM, decreased strength, hypomobility, increased muscle spasms, impaired flexibility, improper body mechanics, postural dysfunction, and pain.   ACTIVITY LIMITATIONS: carrying, lifting, bending, standing, squatting, stairs, transfers, locomotion level, and caring for others  PARTICIPATION LIMITATIONS: meal prep, cleaning, laundry, shopping, community activity, and yard work  PERSONAL FACTORS: Fitness, Time since onset of injury/illness/exacerbation, and 3+  comorbidities: Chronic LBP, DM, HLD, anxiety, depression  are also affecting patient's functional outcome.   REHAB POTENTIAL: Good  CLINICAL DECISION MAKING: Stable/uncomplicated  EVALUATION COMPLEXITY: Low   GOALS: Goals reviewed with patient? Yes  SHORT TERM GOALS: Target date: 01/05/2023    Patient will be independent with HEP in order to improve functional outcomes. Baseline:  Goal status: INITIAL  2.  Patient will report at least 25% improvement in symptoms for improved quality of life. Baseline:  Goal status: INITIAL    LONG TERM GOALS: Target date: 01/26/2023  Patient will report at least 75% improvement in symptoms for improved quality of life. Baseline:  Goal status: INITIAL  2.  Patient will improve FOTO score by at least 7 points in order to indicate improved tolerance to activity. Baseline: 55% function Goal status: INITIAL  3.  Patient will demonstrate at least 25% improvement in lumbar ROM in all restricted planes for improved ability to move trunk while completing chores. Baseline: see above Goal status: INITIAL  4.  Patient will be able to ambulate at least 410 feet in 2MWT in order to demonstrate improved tolerance to activity. Baseline: 360 feet Goal status: INITIAL  5.  Patient will demonstrate grade of 5/5 MMT grade in all tested musculature as evidence of improved strength to assist with stair ambulation and gait.   Baseline: see above Goal status: INITIAL  6.  Patient will be able to complete 5x STS in under 11.4 seconds in order to demo improved functional strength. Baseline: 17.35 seconds Goal status: INITIAL   PLAN:  PT FREQUENCY: 2x/week  PT DURATION: 6 weeks  PLANNED INTERVENTIONS: Therapeutic exercises, Therapeutic activity, Neuromuscular re-education, Balance training, Gait training, Patient/Family education, Joint manipulation, Joint mobilization, Stair training, Orthotic/Fit training, DME instructions, Aquatic Therapy, Dry  Needling, Electrical stimulation, Spinal manipulation, Spinal mobilization, Cryotherapy, Moist heat, Compression bandaging, scar mobilization, Splintting, Taping, Traction, Ultrasound, Ionotophoresis '4mg'$ /ml Dexamethasone, and Manual therapy  PLAN FOR NEXT SESSION: Continue POC and may progress as tolerated with emphasis on core and trunk flexibility and strength.   10:27 AM, 01/09/23 Mearl Latin PT, DPT Physical Therapist at [facility]

## 2023-01-10 ENCOUNTER — Ambulatory Visit (HOSPITAL_COMMUNITY): Payer: Medicare HMO | Attending: Rheumatology | Admitting: Physical Therapy

## 2023-01-10 ENCOUNTER — Other Ambulatory Visit: Payer: Self-pay

## 2023-01-10 DIAGNOSIS — E790 Hyperuricemia without signs of inflammatory arthritis and tophaceous disease: Secondary | ICD-10-CM | POA: Insufficient documentation

## 2023-01-10 DIAGNOSIS — F32A Depression, unspecified: Secondary | ICD-10-CM | POA: Diagnosis present

## 2023-01-10 DIAGNOSIS — M5459 Other low back pain: Secondary | ICD-10-CM | POA: Insufficient documentation

## 2023-01-10 DIAGNOSIS — Z6829 Body mass index (BMI) 29.0-29.9, adult: Secondary | ICD-10-CM | POA: Diagnosis present

## 2023-01-10 DIAGNOSIS — M170 Bilateral primary osteoarthritis of knee: Secondary | ICD-10-CM | POA: Insufficient documentation

## 2023-01-10 DIAGNOSIS — M19041 Primary osteoarthritis, right hand: Secondary | ICD-10-CM | POA: Diagnosis not present

## 2023-01-10 DIAGNOSIS — F419 Anxiety disorder, unspecified: Secondary | ICD-10-CM | POA: Insufficient documentation

## 2023-01-10 DIAGNOSIS — R2689 Other abnormalities of gait and mobility: Secondary | ICD-10-CM | POA: Insufficient documentation

## 2023-01-10 DIAGNOSIS — M19072 Primary osteoarthritis, left ankle and foot: Secondary | ICD-10-CM | POA: Diagnosis present

## 2023-01-10 DIAGNOSIS — E559 Vitamin D deficiency, unspecified: Secondary | ICD-10-CM | POA: Insufficient documentation

## 2023-01-10 DIAGNOSIS — I10 Essential (primary) hypertension: Secondary | ICD-10-CM | POA: Diagnosis present

## 2023-01-10 DIAGNOSIS — E785 Hyperlipidemia, unspecified: Secondary | ICD-10-CM | POA: Insufficient documentation

## 2023-01-10 DIAGNOSIS — M19042 Primary osteoarthritis, left hand: Secondary | ICD-10-CM | POA: Insufficient documentation

## 2023-01-10 DIAGNOSIS — D539 Nutritional anemia, unspecified: Secondary | ICD-10-CM | POA: Diagnosis present

## 2023-01-10 DIAGNOSIS — M19071 Primary osteoarthritis, right ankle and foot: Secondary | ICD-10-CM | POA: Insufficient documentation

## 2023-01-10 DIAGNOSIS — Z79899 Other long term (current) drug therapy: Secondary | ICD-10-CM | POA: Insufficient documentation

## 2023-01-10 DIAGNOSIS — N1832 Chronic kidney disease, stage 3b: Secondary | ICD-10-CM | POA: Diagnosis present

## 2023-01-10 DIAGNOSIS — M0579 Rheumatoid arthritis with rheumatoid factor of multiple sites without organ or systems involvement: Secondary | ICD-10-CM | POA: Insufficient documentation

## 2023-01-10 DIAGNOSIS — M5136 Other intervertebral disc degeneration, lumbar region: Secondary | ICD-10-CM | POA: Insufficient documentation

## 2023-01-10 DIAGNOSIS — E1140 Type 2 diabetes mellitus with diabetic neuropathy, unspecified: Secondary | ICD-10-CM | POA: Insufficient documentation

## 2023-01-10 DIAGNOSIS — E538 Deficiency of other specified B group vitamins: Secondary | ICD-10-CM | POA: Insufficient documentation

## 2023-01-10 DIAGNOSIS — J302 Other seasonal allergic rhinitis: Secondary | ICD-10-CM | POA: Diagnosis present

## 2023-01-10 DIAGNOSIS — R29898 Other symptoms and signs involving the musculoskeletal system: Secondary | ICD-10-CM | POA: Insufficient documentation

## 2023-01-10 DIAGNOSIS — M6281 Muscle weakness (generalized): Secondary | ICD-10-CM | POA: Insufficient documentation

## 2023-01-10 NOTE — Therapy (Unsigned)
OUTPATIENT PHYSICAL THERAPY THORACOLUMBAR TREATMENT   Patient Name: Norman Peters MRN: [id number] DOB:05-01-1956, 67 y.o., male Today's Date: 01/10/2023  END OF SESSION:  PT End of Session - 01/10/23 1031     Visit Number 5    Number of Visits 12    Date for PT Re-Evaluation 01/26/23    Authorization Type Primary [employer]; Secondary Medicaid    Authorization Time Period 12/15/22-01/26/23    Authorization - Visit Number 5    Authorization - Number of Visits 12    Progress Note Due on Visit 10    PT Start Time 1032    PT Stop Time 1112    PT Time Calculation (min) 40 min    Activity Tolerance Patient tolerated treatment well    Behavior During Therapy [facility] for tasks assessed/performed            Past Medical History:  Diagnosis Date   Acute left-sided low back pain with left-sided sciatica 11/04/2020   Anxiety    Arthritis    Depression 2001   Diabetes mellitus 2010   GERD (gastroesophageal reflux disease)    Hyperlipidemia    Hypertension 2005   Past Surgical History:  Procedure Laterality Date   COLONOSCOPY N/A 07/07/2016   Procedure: COLONOSCOPY;  Surgeon: Rogene Houston, MD;  [facility];  Service: Endoscopy;  Laterality: N/A;  1030   FINGER SURGERY Left 1974   fifth , trauma on job, had to replace/repair finger   HERNIA REPAIR  [id number]   x3, umbilical and bilateral hernia   Patient Active Problem List   Diagnosis Date Noted   Encounter for chronic pain management 07/12/2022   Back pain 06/21/2022   B12 deficiency 07/30/2021   Macrocytic anemia 07/22/2021   Chronic kidney disease 07/10/2021   Type 2 diabetes mellitus with diabetic neuropathy, unspecified ([place]) 07/06/2021   Seasonal allergies 09/12/2020   Osteoarthritis of right knee 09/02/2020   type 2 dia with ckd 02/05/2020   Annual visit for general adult medical examination with abnormal findings 01/11/2020   Vitamin D deficiency 01/11/2020   Obesity (BMI 30.0-34.9) 09/03/2019   Dental caries  09/03/2019   Chronic elbow pain, left 06/23/2019   Hyperlipidemia LDL goal <100 05/29/2017   Pain in joint involving multiple sites 02/12/2016   Essential hypertension, benign 02/10/2016   Anxiety and depression 02/10/2016    PCP: Tula Nakayama MD  REFERRING PROVIDER: Carole Civil, MD  REFERRING DIAG: M54.50,G89.29 (ICD-10-CM) - Chronic bilateral low back pain without sciatica  Rationale for Evaluation and Treatment: Rehabilitation  THERAPY DIAG:  Other low back pain  ONSET DATE: 2-3 months  SUBJECTIVE:  SUBJECTIVE STATEMENT: Patient reports continued improvement with low back pain. Feels HEP has been helpful. Pain not bad today.   Eval: Patient has had back pain and muscle relaxer's have been helping. Symptoms have been there for about 2-3 months but has been getting worse. Has been trying to do a little bit of work around the house but it must have bothered his back. Been doing old HEP some but hasn't been doing anything from before since its been so bad.   PERTINENT HISTORY:  Chronic LBP, DM, HLD, anxiety, depression  PAIN:  Are you having pain? Yes: NPRS scale: 3/10 Pain location: low back Pain description: stabbing, pressure Aggravating factors: sit to stand/transfers; sometimes bed mobility Relieving factors: changing positions  PRECAUTIONS: None  WEIGHT BEARING RESTRICTIONS: No  FALLS:  Has patient fallen in last 6 months? No   OCCUPATION: Retired  PLOF: Independent  PATIENT GOALS: back to be feeling better  NEXT MD VISIT: unsure  OBJECTIVE:   PATIENT SURVEYS: FOTO 55% function  SCREENING FOR RED FLAGS: Bowel or bladder incontinence: No Spinal tumors: No Cauda equina syndrome: No Compression fracture: No Abdominal aneurysm: No  COGNITION: Overall  cognitive status: Within functional limits for tasks assessed     SENSATION: WFL   POSTURE: rounded shoulders, forward head, decreased lumbar lordosis, and decreased thoracic kyphosis  PALPATION: TTP lower lumbar paraspinals; hypomobile lower thoracic and lumbar spine  LUMBAR ROM:   AROM eval  Flexion 25% limited  Extension 25% limited  Right lateral flexion 25% limited  Left lateral flexion 50% limited  Right rotation 25% limited  Left rotation 25% limited   (Blank rows = not tested)  LOWER EXTREMITY ROM:   lacking TKE bilaterally with LAQ  Active  Right eval Left eval  Hip flexion    Hip extension    Hip abduction    Hip adduction    Hip internal rotation    Hip external rotation    Knee flexion    Knee extension    Ankle dorsiflexion    Ankle plantarflexion    Ankle inversion    Ankle eversion     (Blank rows = not tested)  LOWER EXTREMITY MMT:    MMT Right eval Left eval  Hip flexion 4+ 4  Hip extension 4* 4*  Hip abduction 4- 4  Hip adduction    Hip internal rotation    Hip external rotation    Knee flexion 4 4  Knee extension 4+ 4+  Ankle dorsiflexion 5 5  Ankle plantarflexion    Ankle inversion    Ankle eversion     (Blank rows = not tested)*=pain  LUMBAR SPECIAL TESTS:  Slump test: Negative  FUNCTIONAL TESTS:  5 times sit to stand: 17.35 seconds without UE support, unsteady, relies LLE 2 minute walk test: 360 feet  GAIT: Distance walked: 360 feet Assistive device utilized: None Level of assistance: Complete Independence Comments: 2MWT, bilateral knee pain lacking TKE  TODAY'S TREATMENT:  DATE:   01/10/23 Prone: POE 3 x 30 second holds Press up 2 x 10  Prone hip extension 2 x 10 bilateral  Alternating UE/ LE raise x 10   Supine: LTR 10 x 5 second holds SLR 2 x 10 bilateral  Bridge 1 x 20    Standing: STS 2 x 10 (elevated mat)  Standing Row BTB 2 x 10  Shoulder extension BTB 2 x 10   01/04/23 POE 2 x 30 second holds Press up 2 x 10  Prone hip extension 2 x 10 bilateral  LTR 20 x 5 second holds SLR 2 x 10 bilateral  Bridge 1 x 20  STS 1 x 10  Standing Row BTB 2 x 10   12/31/22 Seated:  Hamstring stretch x 30" x 3  Abdominal isometrics with physioball x 3" x 10 x 2  Trunk flex stretch 3 ways with physioball x 30" on each  Hip hinges with a dowel x 10 Supine:  Piriformis stretch x 30" x 3  LTR x 1'  DL bridging x 3" x 10 x 2 Prone  POE x 30" x 2  Press up x 3" x 10   PATIENT EDUCATION:  Education details: 01/04/23: HEP;  EVAL: Patient educated on exam findings, POC, scope of PT, HEP, and dosing of exercises . Person educated: Patient Education method: Explanation, Demonstration, and Handouts Education comprehension: verbalized understanding, returned demonstration, verbal cues required, and tactile cues required  HOME EXERCISE PROGRAM: Access Code: [email or web address] URL: [email or web address]  01/10/23 - Standing Shoulder Row with Anchored Resistance  - 1-2 x daily - 7 x weekly - 2 sets - 10 reps - Shoulder extension with resistance - Neutral  - 1-2 x daily - 7 x weekly - 2 sets - 10 reps  01/04/23 - Prone Hip Extension  - 1 x daily - 7 x weekly - 2 sets - 10 reps - Sit to Stand with Arms Crossed  - 1 x daily - 7 x weekly - 2 sets - 10 reps  12/29/22 - Supine Transversus Abdominis Bracing - Hands on Stomach  - 2 x daily - 7 x weekly - 1 sets - 10 reps - 5 second hold - Supine March  - 2 x daily - 7 x weekly - 2 sets - 10 reps - Supine Straight Leg Raises  - 2 x daily - 7 x weekly - 2 sets - 10 reps - Sidelying Hip Abduction  - 2 x daily - 7 x weekly - 2 sets - 10 reps - Supine Bridge  - 2 x daily - 7 x weekly - 2 sets - 10 reps - 5 second hold   Date: 12/15/2022 - Prone Press Up On Elbows  - 3-5 x daily - 7 x weekly - 3 reps - 30 second hold -  Prone Press Up  - 3-5 x daily - 7 x weekly - 2 sets - 10 reps - Seated Long Arc Quad  - 3 x daily - 7 x weekly - 2 sets - 10 reps - 5 second hold  ASSESSMENT:  CLINICAL IMPRESSION: Patient tolerated session well. Added prone alternating UE/ LE raises for multifidus activation. Patient cued on arm positioning to reduce shoulder strain. Performed sit to stands from elevated surface with good result. Patient cued on form and body mechanics for reduced knee strain. Patient will continue to benefit from skilled therapy services to reduce remaining deficits and improve functional ability.   OBJECTIVE IMPAIRMENTS: Abnormal gait, decreased activity  tolerance, decreased balance, decreased endurance, decreased mobility, difficulty walking, decreased ROM, decreased strength, hypomobility, increased muscle spasms, impaired flexibility, improper body mechanics, postural dysfunction, and pain.   ACTIVITY LIMITATIONS: carrying, lifting, bending, standing, squatting, stairs, transfers, locomotion level, and caring for others  PARTICIPATION LIMITATIONS: meal prep, cleaning, laundry, shopping, community activity, and yard work  PERSONAL FACTORS: Fitness, Time since onset of injury/illness/exacerbation, and 3+ comorbidities: Chronic LBP, DM, HLD, anxiety, depression  are also affecting patient's functional outcome.   REHAB POTENTIAL: Good  CLINICAL DECISION MAKING: Stable/uncomplicated  EVALUATION COMPLEXITY: Low   GOALS: Goals reviewed with patient? Yes  SHORT TERM GOALS: Target date: 01/05/2023    Patient will be independent with HEP in order to improve functional outcomes. Baseline:  Goal status: INITIAL  2.  Patient will report at least 25% improvement in symptoms for improved quality of life. Baseline:  Goal status: INITIAL    LONG TERM GOALS: Target date: 01/26/2023    Patient will report at least 75% improvement in symptoms for improved quality of life. Baseline:  Goal status:  INITIAL  2.  Patient will improve FOTO score by at least 7 points in order to indicate improved tolerance to activity. Baseline: 55% function Goal status: INITIAL  3.  Patient will demonstrate at least 25% improvement in lumbar ROM in all restricted planes for improved ability to move trunk while completing chores. Baseline: see above Goal status: INITIAL  4.  Patient will be able to ambulate at least 410 feet in 2MWT in order to demonstrate improved tolerance to activity. Baseline: 360 feet Goal status: INITIAL  5.  Patient will demonstrate grade of 5/5 MMT grade in all tested musculature as evidence of improved strength to assist with stair ambulation and gait.   Baseline: see above Goal status: INITIAL  6.  Patient will be able to complete 5x STS in under 11.4 seconds in order to demo improved functional strength. Baseline: 17.35 seconds Goal status: INITIAL   PLAN:  PT FREQUENCY: 2x/week  PT DURATION: 6 weeks  PLANNED INTERVENTIONS: Therapeutic exercises, Therapeutic activity, Neuromuscular re-education, Balance training, Gait training, Patient/Family education, Joint manipulation, Joint mobilization, Stair training, Orthotic/Fit training, DME instructions, Aquatic Therapy, Dry Needling, Electrical stimulation, Spinal manipulation, Spinal mobilization, Cryotherapy, Moist heat, Compression bandaging, scar mobilization, Splintting, Taping, Traction, Ultrasound, Ionotophoresis '4mg'$ /ml Dexamethasone, and Manual therapy  PLAN FOR NEXT SESSION: Continue POC and may progress as tolerated with emphasis on core and trunk flexibility and strength.   11:11 AM, 01/10/23 Josue Hector PT DPT  Physical Therapist with [facility]  [phone]

## 2023-01-12 ENCOUNTER — Ambulatory Visit (HOSPITAL_COMMUNITY): Payer: Medicare HMO

## 2023-01-12 DIAGNOSIS — R2689 Other abnormalities of gait and mobility: Secondary | ICD-10-CM | POA: Diagnosis not present

## 2023-01-12 DIAGNOSIS — M19041 Primary osteoarthritis, right hand: Secondary | ICD-10-CM | POA: Diagnosis not present

## 2023-01-12 DIAGNOSIS — M170 Bilateral primary osteoarthritis of knee: Secondary | ICD-10-CM | POA: Diagnosis not present

## 2023-01-12 DIAGNOSIS — M5459 Other low back pain: Secondary | ICD-10-CM

## 2023-01-12 DIAGNOSIS — Z79899 Other long term (current) drug therapy: Secondary | ICD-10-CM | POA: Diagnosis not present

## 2023-01-12 DIAGNOSIS — M6281 Muscle weakness (generalized): Secondary | ICD-10-CM | POA: Diagnosis not present

## 2023-01-12 DIAGNOSIS — M19042 Primary osteoarthritis, left hand: Secondary | ICD-10-CM | POA: Diagnosis not present

## 2023-01-12 DIAGNOSIS — M0579 Rheumatoid arthritis with rheumatoid factor of multiple sites without organ or systems involvement: Secondary | ICD-10-CM | POA: Diagnosis not present

## 2023-01-12 DIAGNOSIS — R29898 Other symptoms and signs involving the musculoskeletal system: Secondary | ICD-10-CM | POA: Diagnosis not present

## 2023-01-12 NOTE — Therapy (Signed)
OUTPATIENT PHYSICAL THERAPY THORACOLUMBAR TREATMENT   Patient Name: Norman Peters MRN: [id number] DOB:June 02, 1956, 67 y.o., male Today's Date: 01/12/2023  END OF SESSION:  PT End of Session - 01/12/23 0820     Visit Number 6    Number of Visits 12    Date for PT Re-Evaluation 01/26/23    Authorization Type Primary [employer]; Secondary Medicaid    Authorization Time Period 12/15/22-01/26/23    Authorization - Visit Number 6    Authorization - Number of Visits 12    Progress Note Due on Visit 10    PT Start Time 0815    PT Stop Time [id number]    PT Time Calculation (min) 40 min    Activity Tolerance Patient tolerated treatment well    Behavior During Therapy [facility] for tasks assessed/performed            Past Medical History:  Diagnosis Date   Acute left-sided low back pain with left-sided sciatica 11/04/2020   Anxiety    Arthritis    Depression 2001   Diabetes mellitus 2010   GERD (gastroesophageal reflux disease)    Hyperlipidemia    Hypertension 2005   Past Surgical History:  Procedure Laterality Date   COLONOSCOPY N/A 07/07/2016   Procedure: COLONOSCOPY;  Surgeon: Rogene Houston, MD;  [facility];  Service: Endoscopy;  Laterality: N/A;  1030   FINGER SURGERY Left 1974   fifth , trauma on job, had to replace/repair finger   HERNIA REPAIR  [id number]   x3, umbilical and bilateral hernia   Patient Active Problem List   Diagnosis Date Noted   Encounter for chronic pain management 07/12/2022   Back pain 06/21/2022   B12 deficiency 07/30/2021   Macrocytic anemia 07/22/2021   Chronic kidney disease 07/10/2021   Type 2 diabetes mellitus with diabetic neuropathy, unspecified ([place]) 07/06/2021   Seasonal allergies 09/12/2020   Osteoarthritis of right knee 09/02/2020   type 2 dia with ckd 02/05/2020   Annual visit for general adult medical examination with abnormal findings 01/11/2020   Vitamin D deficiency 01/11/2020   Obesity (BMI 30.0-34.9) 09/03/2019   Dental caries  09/03/2019   Chronic elbow pain, left 06/23/2019   Hyperlipidemia LDL goal <100 05/29/2017   Pain in joint involving multiple sites 02/12/2016   Essential hypertension, benign 02/10/2016   Anxiety and depression 02/10/2016    PCP: Tula Nakayama MD  REFERRING PROVIDER: Carole Civil, MD  REFERRING DIAG: M54.50,G89.29 (ICD-10-CM) - Chronic bilateral low back pain without sciatica  Rationale for Evaluation and Treatment: Rehabilitation  THERAPY DIAG:  Other low back pain  ONSET DATE: 2-3 months  SUBJECTIVE:  SUBJECTIVE STATEMENT: Currently reports of slight pain on the low back = 3/10. However, patient reports that B knees are hurting, 8/10 so he has to use [facility] today. Denies recent falls or trauma. States that he has been doing his HEP without issues.  Eval: Patient has had back pain and muscle relaxer's have been helping. Symptoms have been there for about 2-3 months but has been getting worse. Has been trying to do a little bit of work around the house but it must have bothered his back. Been doing old HEP some but hasn't been doing anything from before since its been so bad.   PERTINENT HISTORY:  Chronic LBP, DM, HLD, anxiety, depression  PAIN:  Are you having pain? Yes: NPRS scale: 3/10 Pain location: low back Pain description: stabbing, pressure Aggravating factors: sit to stand/transfers; sometimes bed mobility Relieving factors: changing positions  PRECAUTIONS: None  WEIGHT BEARING RESTRICTIONS: No  FALLS:  Has patient fallen in last 6 months? No   OCCUPATION: Retired  PLOF: Independent  PATIENT GOALS: back to be feeling better  NEXT MD VISIT: unsure  OBJECTIVE:   PATIENT SURVEYS: FOTO 55% function  SCREENING FOR RED FLAGS: Bowel or bladder incontinence:  No Spinal tumors: No Cauda equina syndrome: No Compression fracture: No Abdominal aneurysm: No  COGNITION: Overall cognitive status: Within functional limits for tasks assessed     SENSATION: WFL   POSTURE: rounded shoulders, forward head, decreased lumbar lordosis, and decreased thoracic kyphosis  PALPATION: TTP lower lumbar paraspinals; hypomobile lower thoracic and lumbar spine  LUMBAR ROM:   AROM eval  Flexion 25% limited  Extension 25% limited  Right lateral flexion 25% limited  Left lateral flexion 50% limited  Right rotation 25% limited  Left rotation 25% limited   (Blank rows = not tested)  LOWER EXTREMITY ROM:   lacking TKE bilaterally with LAQ  Active  Right eval Left eval  Hip flexion    Hip extension    Hip abduction    Hip adduction    Hip internal rotation    Hip external rotation    Knee flexion    Knee extension    Ankle dorsiflexion    Ankle plantarflexion    Ankle inversion    Ankle eversion     (Blank rows = not tested)  LOWER EXTREMITY MMT:    MMT Right eval Left eval  Hip flexion 4+ 4  Hip extension 4* 4*  Hip abduction 4- 4  Hip adduction    Hip internal rotation    Hip external rotation    Knee flexion 4 4  Knee extension 4+ 4+  Ankle dorsiflexion 5 5  Ankle plantarflexion    Ankle inversion    Ankle eversion     (Blank rows = not tested)*=pain  LUMBAR SPECIAL TESTS:  Slump test: Negative  FUNCTIONAL TESTS:  5 times sit to stand: 17.35 seconds without UE support, unsteady, relies LLE 2 minute walk test: 360 feet  GAIT: Distance walked: 360 feet Assistive device utilized: None Level of assistance: Complete Independence Comments: 2MWT, bilateral knee pain lacking TKE  TODAY'S TREATMENT:  DATE:  01/12/23 Seated:  Hamstring stretch x 30" x 3 Supine:  Piriformis stretch (figure-of-4) x 30" x  3  LTR x 5" x 10  DL bridging x 3" x 10 x 2 Prone  POE x 30" x 2  Press up x 3" x 10 Alternating UE/ LE raise x 10 Standing:  Abdominal isometrics with physioball, neutral pelvis x 3" x 10 x 2  Hip hinges with a dowel x 10 x 2  Pallof press x RTB x 10  01/10/23 Prone: POE 3 x 30 second holds Press up 2 x 10  Prone hip extension 2 x 10 bilateral  Alternating UE/ LE raise x 10   Supine: LTR 10 x 5 second holds SLR 2 x 10 bilateral  Bridge 1 x 20   Standing: STS 2 x 10 (elevated mat)  Standing Row BTB 2 x 10  Shoulder extension BTB 2 x 10   01/04/23 POE 2 x 30 second holds Press up 2 x 10  Prone hip extension 2 x 10 bilateral  LTR 20 x 5 second holds SLR 2 x 10 bilateral  Bridge 1 x 20  STS 1 x 10  Standing Row BTB 2 x 10   12/31/22 Seated:  Hamstring stretch x 30" x 3  Abdominal isometrics with physioball x 3" x 10 x 2  Trunk flex stretch 3 ways with physioball x 30" on each  Hip hinges with a dowel x 10 Supine:  Piriformis stretch x 30" x 3  LTR x 1'  DL bridging x 3" x 10 x 2 Prone  POE x 30" x 2  Press up x 3" x 10   PATIENT EDUCATION:  Education details: 01/04/23: HEP;  EVAL: Patient educated on exam findings, POC, scope of PT, HEP, and dosing of exercises . Person educated: Patient Education method: Explanation, Demonstration, and Handouts Education comprehension: verbalized understanding, returned demonstration, verbal cues required, and tactile cues required  HOME EXERCISE PROGRAM: Access Code: [email or web address] URL: [email or web address]  01/10/23 - Standing Shoulder Row with Anchored Resistance  - 1-2 x daily - 7 x weekly - 2 sets - 10 reps - Shoulder extension with resistance - Neutral  - 1-2 x daily - 7 x weekly - 2 sets - 10 reps  01/04/23 - Prone Hip Extension  - 1 x daily - 7 x weekly - 2 sets - 10 reps - Sit to Stand with Arms Crossed  - 1 x daily - 7 x weekly - 2 sets - 10 reps  12/29/22 - Supine Transversus Abdominis Bracing - Hands  on Stomach  - 2 x daily - 7 x weekly - 1 sets - 10 reps - 5 second hold - Supine March  - 2 x daily - 7 x weekly - 2 sets - 10 reps - Supine Straight Leg Raises  - 2 x daily - 7 x weekly - 2 sets - 10 reps - Sidelying Hip Abduction  - 2 x daily - 7 x weekly - 2 sets - 10 reps - Supine Bridge  - 2 x daily - 7 x weekly - 2 sets - 10 reps - 5 second hold   Date: 12/15/2022 - Prone Press Up On Elbows  - 3-5 x daily - 7 x weekly - 3 reps - 30 second hold - Prone Press Up  - 3-5 x daily - 7 x weekly - 2 sets - 10 reps - Seated Long Arc Quad  - 3 x daily - 7 x  weekly - 2 sets - 10 reps - 5 second hold  ASSESSMENT:  CLINICAL IMPRESSION: Interventions today were geared towards core and LE strengthening, and LE flexibility. Tolerated all activities without worsening of symptoms. Demonstrated appropriate levels of fatigue. Provided slight amount of cueing to ensure correct execution of activity with good carry-over. To date, skilled PT is required to address the impairments and improve function.   OBJECTIVE IMPAIRMENTS: Abnormal gait, decreased activity tolerance, decreased balance, decreased endurance, decreased mobility, difficulty walking, decreased ROM, decreased strength, hypomobility, increased muscle spasms, impaired flexibility, improper body mechanics, postural dysfunction, and pain.   ACTIVITY LIMITATIONS: carrying, lifting, bending, standing, squatting, stairs, transfers, locomotion level, and caring for others  PARTICIPATION LIMITATIONS: meal prep, cleaning, laundry, shopping, community activity, and yard work  PERSONAL FACTORS: Fitness, Time since onset of injury/illness/exacerbation, and 3+ comorbidities: Chronic LBP, DM, HLD, anxiety, depression  are also affecting patient's functional outcome.   REHAB POTENTIAL: Good  CLINICAL DECISION MAKING: Stable/uncomplicated  EVALUATION COMPLEXITY: Low   GOALS: Goals reviewed with patient? Yes  SHORT TERM GOALS: Target date: 01/05/2023     Patient will be independent with HEP in order to improve functional outcomes. Baseline:  Goal status: INITIAL  2.  Patient will report at least 25% improvement in symptoms for improved quality of life. Baseline:  Goal status: INITIAL    LONG TERM GOALS: Target date: 01/26/2023    Patient will report at least 75% improvement in symptoms for improved quality of life. Baseline:  Goal status: INITIAL  2.  Patient will improve FOTO score by at least 7 points in order to indicate improved tolerance to activity. Baseline: 55% function Goal status: INITIAL  3.  Patient will demonstrate at least 25% improvement in lumbar ROM in all restricted planes for improved ability to move trunk while completing chores. Baseline: see above Goal status: INITIAL  4.  Patient will be able to ambulate at least 410 feet in 2MWT in order to demonstrate improved tolerance to activity. Baseline: 360 feet Goal status: INITIAL  5.  Patient will demonstrate grade of 5/5 MMT grade in all tested musculature as evidence of improved strength to assist with stair ambulation and gait.   Baseline: see above Goal status: INITIAL  6.  Patient will be able to complete 5x STS in under 11.4 seconds in order to demo improved functional strength. Baseline: 17.35 seconds Goal status: INITIAL   PLAN:  PT FREQUENCY: 2x/week  PT DURATION: 6 weeks  PLANNED INTERVENTIONS: Therapeutic exercises, Therapeutic activity, Neuromuscular re-education, Balance training, Gait training, Patient/Family education, Joint manipulation, Joint mobilization, Stair training, Orthotic/Fit training, DME instructions, Aquatic Therapy, Dry Needling, Electrical stimulation, Spinal manipulation, Spinal mobilization, Cryotherapy, Moist heat, Compression bandaging, scar mobilization, Splintting, Taping, Traction, Ultrasound, Ionotophoresis '4mg'$ /ml Dexamethasone, and Manual therapy  PLAN FOR NEXT SESSION: Continue POC and may progress as tolerated  with emphasis on core and trunk flexibility and strength.   8:59 AM, 01/12/23 Chrissie Noa L. Cedarius Kersh, PT, DPT, OCS Board-Certified Clinical Specialist in [place] # ([place]): [phone] T

## 2023-01-17 ENCOUNTER — Encounter (HOSPITAL_COMMUNITY): Payer: Self-pay | Admitting: Physical Therapy

## 2023-01-17 ENCOUNTER — Telehealth: Payer: Self-pay | Admitting: Rheumatology

## 2023-01-17 ENCOUNTER — Ambulatory Visit (HOSPITAL_COMMUNITY): Payer: Medicare HMO | Admitting: Physical Therapy

## 2023-01-17 DIAGNOSIS — R2689 Other abnormalities of gait and mobility: Secondary | ICD-10-CM | POA: Diagnosis not present

## 2023-01-17 DIAGNOSIS — M0579 Rheumatoid arthritis with rheumatoid factor of multiple sites without organ or systems involvement: Secondary | ICD-10-CM | POA: Diagnosis not present

## 2023-01-17 DIAGNOSIS — Z79899 Other long term (current) drug therapy: Secondary | ICD-10-CM | POA: Diagnosis not present

## 2023-01-17 DIAGNOSIS — M6281 Muscle weakness (generalized): Secondary | ICD-10-CM

## 2023-01-17 DIAGNOSIS — M19042 Primary osteoarthritis, left hand: Secondary | ICD-10-CM | POA: Diagnosis not present

## 2023-01-17 DIAGNOSIS — M5459 Other low back pain: Secondary | ICD-10-CM | POA: Diagnosis not present

## 2023-01-17 DIAGNOSIS — M170 Bilateral primary osteoarthritis of knee: Secondary | ICD-10-CM | POA: Diagnosis not present

## 2023-01-17 DIAGNOSIS — R29898 Other symptoms and signs involving the musculoskeletal system: Secondary | ICD-10-CM

## 2023-01-17 DIAGNOSIS — M19041 Primary osteoarthritis, right hand: Secondary | ICD-10-CM | POA: Diagnosis not present

## 2023-01-17 NOTE — Telephone Encounter (Signed)
Reached out to patient to schedule a sooner fu appointment per Dr. Estanislado Pandy. Patient is scheduled for Thursday 01/20/23 at 11:00am. Patient states he would like Dr. Estanislado Pandy to reach out to Dr. Aline Brochure at [facility] in [place] regarding what medications she may prescribe him. Patient states Dr. Aline Brochure wants him to have a knee replacement and needs to know what he will be on because it effects his anesthesia.

## 2023-01-17 NOTE — Therapy (Signed)
OUTPATIENT PHYSICAL THERAPY THORACOLUMBAR TREATMENT   Patient Name: Norman Peters MRN: [id number] DOB:01/20/1956, 67 y.o., male Today's Date: 01/17/2023  END OF SESSION:  PT End of Session - 01/17/23 0821     Visit Number 7    Number of Visits 12    Date for PT Re-Evaluation 01/26/23    Authorization Type Primary [employer]; Secondary Medicaid    Authorization Time Period 12/15/22-01/26/23    Authorization - Visit Number 7    Authorization - Number of Visits 12    Progress Note Due on Visit 10    PT Start Time 0821    PT Stop Time [id number]    PT Time Calculation (min) 38 min    Activity Tolerance Patient tolerated treatment well    Behavior During Therapy [facility] for tasks assessed/performed            Past Medical History:  Diagnosis Date   Acute left-sided low back pain with left-sided sciatica 11/04/2020   Anxiety    Arthritis    Depression 2001   Diabetes mellitus 2010   GERD (gastroesophageal reflux disease)    Hyperlipidemia    Hypertension 2005   Past Surgical History:  Procedure Laterality Date   COLONOSCOPY N/A 07/07/2016   Procedure: COLONOSCOPY;  Surgeon: Rogene Houston, MD;  Location: [facility];  Service: Endoscopy;  Laterality: N/A;  1030   FINGER SURGERY Left 1974   fifth , trauma on job, had to replace/repair finger   HERNIA REPAIR  [id number]   x3, umbilical and bilateral hernia   Patient Active Problem List   Diagnosis Date Noted   Encounter for chronic pain management 07/12/2022   Back pain 06/21/2022   B12 deficiency 07/30/2021   Macrocytic anemia 07/22/2021   Chronic kidney disease 07/10/2021   Type 2 diabetes mellitus with diabetic neuropathy, unspecified ([place]) 07/06/2021   Seasonal allergies 09/12/2020   Osteoarthritis of right knee 09/02/2020   type 2 dia with ckd 02/05/2020   Annual visit for general adult medical examination with abnormal findings 01/11/2020   Vitamin D deficiency 01/11/2020   Obesity (BMI 30.0-34.9) 09/03/2019   Dental caries  09/03/2019   Chronic elbow pain, left 06/23/2019   Hyperlipidemia LDL goal <100 05/29/2017   Pain in joint involving multiple sites 02/12/2016   Essential hypertension, benign 02/10/2016   Anxiety and depression 02/10/2016    PCP: Tula Nakayama MD  REFERRING PROVIDER: Carole Civil, MD  REFERRING DIAG: M54.50,G89.29 (ICD-10-CM) - Chronic bilateral low back pain without sciatica  Rationale for Evaluation and Treatment: Rehabilitation  THERAPY DIAG:  Other low back pain  Muscle weakness (generalized)  Other abnormalities of gait and mobility  Other symptoms and signs involving the musculoskeletal system  ONSET DATE: 2-3 months  SUBJECTIVE:  SUBJECTIVE STATEMENT: Back has been doing good. Has been doing HEP. Knee bothering him some.   Eval: Patient has had back pain and muscle relaxer's have been helping. Symptoms have been there for about 2-3 months but has been getting worse. Has been trying to do a little bit of work around the house but it must have bothered his back. Been doing old HEP some but hasn't been doing anything from before since its been so bad.   PERTINENT HISTORY:  Chronic LBP, DM, HLD, anxiety, depression  PAIN:  Are you having pain? Yes: NPRS scale: 2/10 low back, knees 5/10 Pain location: low back Pain description: stabbing, pressure Aggravating factors: sit to stand/transfers; sometimes bed mobility Relieving factors: changing positions  PRECAUTIONS: None  WEIGHT BEARING RESTRICTIONS: No  FALLS:  Has patient fallen in last 6 months? No   OCCUPATION: Retired  PLOF: Independent  PATIENT GOALS: back to be feeling better  NEXT MD VISIT: unsure  OBJECTIVE:   PATIENT SURVEYS: FOTO 55% function  SCREENING FOR RED FLAGS: Bowel or bladder incontinence:  No Spinal tumors: No Cauda equina syndrome: No Compression fracture: No Abdominal aneurysm: No  COGNITION: Overall cognitive status: Within functional limits for tasks assessed     SENSATION: WFL   POSTURE: rounded shoulders, forward head, decreased lumbar lordosis, and decreased thoracic kyphosis  PALPATION: TTP lower lumbar paraspinals; hypomobile lower thoracic and lumbar spine  LUMBAR ROM:   AROM eval  Flexion 25% limited  Extension 25% limited  Right lateral flexion 25% limited  Left lateral flexion 50% limited  Right rotation 25% limited  Left rotation 25% limited   (Blank rows = not tested)  LOWER EXTREMITY ROM:   lacking TKE bilaterally with LAQ  Active  Right eval Left eval  Hip flexion    Hip extension    Hip abduction    Hip adduction    Hip internal rotation    Hip external rotation    Knee flexion    Knee extension    Ankle dorsiflexion    Ankle plantarflexion    Ankle inversion    Ankle eversion     (Blank rows = not tested)  LOWER EXTREMITY MMT:    MMT Right eval Left eval  Hip flexion 4+ 4  Hip extension 4* 4*  Hip abduction 4- 4  Hip adduction    Hip internal rotation    Hip external rotation    Knee flexion 4 4  Knee extension 4+ 4+  Ankle dorsiflexion 5 5  Ankle plantarflexion    Ankle inversion    Ankle eversion     (Blank rows = not tested)*=pain  LUMBAR SPECIAL TESTS:  Slump test: Negative  FUNCTIONAL TESTS:  5 times sit to stand: 17.35 seconds without UE support, unsteady, relies LLE 2 minute walk test: 360 feet  GAIT: Distance walked: 360 feet Assistive device utilized: None Level of assistance: Complete Independence Comments: 2MWT, bilateral knee pain lacking TKE  TODAY'S TREATMENT:  DATE:  01/17/23 POE 2 x 30 second holds Press up 2x 10  Prone hip extension 1 x 10 Prone alternating  UE/LE lifts 1 x 10  DKTC with heels on Hitch ball 2 x 10 with 5 second holds Supine ab iso with Tamer ball 1 x 10 with 5 second holds SLR with quad set 1 x 10 bilateral LAQ 1 x 10 with 5 second holds bilateral  Step up 4 inch 1 x 10 bilateral  Lateral step up 4 inch 1 x 10 bilateral   01/12/23 Seated:  Hamstring stretch x 30" x 3 Supine:  Piriformis stretch (figure-of-4) x 30" x 3  LTR x 5" x 10  DL bridging x 3" x 10 x 2 Prone  POE x 30" x 2  Press up x 3" x 10 Alternating UE/ LE raise x 10 Standing:  Abdominal isometrics with physioball, neutral pelvis x 3" x 10 x 2  Hip hinges with a dowel x 10 x 2  Pallof press x RTB x 10  01/10/23 Prone: POE 3 x 30 second holds Press up 2 x 10  Prone hip extension 2 x 10 bilateral  Alternating UE/ LE raise x 10   Supine: LTR 10 x 5 second holds SLR 2 x 10 bilateral  Bridge 1 x 20   Standing: STS 2 x 10 (elevated mat)  Standing Row BTB 2 x 10  Shoulder extension BTB 2 x 10   01/04/23 POE 2 x 30 second holds Press up 2 x 10  Prone hip extension 2 x 10 bilateral  LTR 20 x 5 second holds SLR 2 x 10 bilateral  Bridge 1 x 20  STS 1 x 10  Standing Row BTB 2 x 10   12/31/22 Seated:  Hamstring stretch x 30" x 3  Abdominal isometrics with physioball x 3" x 10 x 2  Trunk flex stretch 3 ways with physioball x 30" on each  Hip hinges with a dowel x 10 Supine:  Piriformis stretch x 30" x 3  LTR x 1'  DL bridging x 3" x 10 x 2 Prone  POE x 30" x 2  Press up x 3" x 10   PATIENT EDUCATION:  Education details: 01/17/23: HEP; 01/04/23: HEP;  EVAL: Patient educated on exam findings, POC, scope of PT, HEP, and dosing of exercises . Person educated: Patient Education method: Explanation, Demonstration, and Handouts Education comprehension: verbalized understanding, returned demonstration, verbal cues required, and tactile cues required  HOME EXERCISE PROGRAM: Access Code: [email or web address] URL: [email or web address]  01/10/23 -  Standing Shoulder Row with Anchored Resistance  - 1-2 x daily - 7 x weekly - 2 sets - 10 reps - Shoulder extension with resistance - Neutral  - 1-2 x daily - 7 x weekly - 2 sets - 10 reps  01/04/23 - Prone Hip Extension  - 1 x daily - 7 x weekly - 2 sets - 10 reps - Sit to Stand with Arms Crossed  - 1 x daily - 7 x weekly - 2 sets - 10 reps  12/29/22 - Supine Transversus Abdominis Bracing - Hands on Stomach  - 2 x daily - 7 x weekly - 1 sets - 10 reps - 5 second hold - Supine March  - 2 x daily - 7 x weekly - 2 sets - 10 reps - Supine Straight Leg Raises  - 2 x daily - 7 x weekly - 2 sets - 10 reps - Sidelying Hip Abduction  - 2  x daily - 7 x weekly - 2 sets - 10 reps - Supine Bridge  - 2 x daily - 7 x weekly - 2 sets - 10 reps - 5 second hold   Date: 12/15/2022 - Prone Press Up On Elbows  - 3-5 x daily - 7 x weekly - 3 reps - 30 second hold - Prone Press Up  - 3-5 x daily - 7 x weekly - 2 sets - 10 reps - Seated Long Arc Quad  - 3 x daily - 7 x weekly - 2 sets - 10 reps - 5 second hold  ASSESSMENT:  CLINICAL IMPRESSION: Began session with previously completed extension exercises. Continued with core and glute strengthening which is tolerated well. Added quad strengthening for bilateral knee pain and weakness. Cueing required for knee flexion with step exercises rather than weight shifting. Patient will continue to benefit from physical therapy in order to improve function and reduce impairment.    OBJECTIVE IMPAIRMENTS: Abnormal gait, decreased activity tolerance, decreased balance, decreased endurance, decreased mobility, difficulty walking, decreased ROM, decreased strength, hypomobility, increased muscle spasms, impaired flexibility, improper body mechanics, postural dysfunction, and pain.   ACTIVITY LIMITATIONS: carrying, lifting, bending, standing, squatting, stairs, transfers, locomotion level, and caring for others  PARTICIPATION LIMITATIONS: meal prep, cleaning, laundry,  shopping, community activity, and yard work  PERSONAL FACTORS: Fitness, Time since onset of injury/illness/exacerbation, and 3+ comorbidities: Chronic LBP, DM, HLD, anxiety, depression  are also affecting patient's functional outcome.   REHAB POTENTIAL: Good  CLINICAL DECISION MAKING: Stable/uncomplicated  EVALUATION COMPLEXITY: Low   GOALS: Goals reviewed with patient? Yes  SHORT TERM GOALS: Target date: 01/05/2023    Patient will be independent with HEP in order to improve functional outcomes. Baseline:  Goal status: INITIAL  2.  Patient will report at least 25% improvement in symptoms for improved quality of life. Baseline:  Goal status: INITIAL    LONG TERM GOALS: Target date: 01/26/2023    Patient will report at least 75% improvement in symptoms for improved quality of life. Baseline:  Goal status: INITIAL  2.  Patient will improve FOTO score by at least 7 points in order to indicate improved tolerance to activity. Baseline: 55% function Goal status: INITIAL  3.  Patient will demonstrate at least 25% improvement in lumbar ROM in all restricted planes for improved ability to move trunk while completing chores. Baseline: see above Goal status: INITIAL  4.  Patient will be able to ambulate at least 410 feet in 2MWT in order to demonstrate improved tolerance to activity. Baseline: 360 feet Goal status: INITIAL  5.  Patient will demonstrate grade of 5/5 MMT grade in all tested musculature as evidence of improved strength to assist with stair ambulation and gait.   Baseline: see above Goal status: INITIAL  6.  Patient will be able to complete 5x STS in under 11.4 seconds in order to demo improved functional strength. Baseline: 17.35 seconds Goal status: INITIAL   PLAN:  PT FREQUENCY: 2x/week  PT DURATION: 6 weeks  PLANNED INTERVENTIONS: Therapeutic exercises, Therapeutic activity, Neuromuscular re-education, Balance training, Gait training, Patient/Family  education, Joint manipulation, Joint mobilization, Stair training, Orthotic/Fit training, DME instructions, Aquatic Therapy, Dry Needling, Electrical stimulation, Spinal manipulation, Spinal mobilization, Cryotherapy, Moist heat, Compression bandaging, scar mobilization, Splintting, Taping, Traction, Ultrasound, Ionotophoresis '4mg'$ /ml Dexamethasone, and Manual therapy  PLAN FOR NEXT SESSION: Continue POC and may progress as tolerated with emphasis on core and trunk flexibility and strength.   8:21 AM, 01/17/23 Vianne Bulls. [place] Markgraf  PT, DPT Physical Therapist at [facility]

## 2023-01-18 ENCOUNTER — Other Ambulatory Visit: Payer: Self-pay | Admitting: Nurse Practitioner

## 2023-01-18 NOTE — Progress Notes (Signed)
Office Visit Note  Patient: Norman Peters             Date of Birth: 11/20/1955           MRN: [id number]             PCP: Kerri Perches, MD Referring: Kerri Perches, MD Visit Date: 01/20/2023 Occupation: @GUAROCC @  Subjective:  Pain and inflammation in joints  History of Present Illness: Norman Peters is a 67 y.o. male with seropositive rheumatoid arthritis.  He states he continues to have intermittent swelling in his joints.  He continues to have discomfort in his bilateral shoulders, elbows, wrists hands, bilateral knee joints, and feet.  He has been followed by Dr. Romeo Apple for severe osteoarthritis in his knee joints and will require total knee replacement.  He notices intermittent swelling in his joints.    Activities of Daily Living:  Patient reports morning stiffness for 10 minutes.   Patient Reports nocturnal pain.  Difficulty dressing/grooming: Reports Difficulty climbing stairs: Reports Difficulty getting out of chair: Reports Difficulty using hands for taps, buttons, cutlery, and/or writing: Denies  Review of Systems  Constitutional:  Positive for fatigue.  HENT:  Positive for mouth dryness. Negative for mouth sores.   Eyes:  Negative for dryness.  Respiratory:  Negative for difficulty breathing.   Cardiovascular:  Negative for chest pain and palpitations.  Gastrointestinal:  Positive for constipation. Negative for blood in stool and diarrhea.  Endocrine: Negative for increased urination.  Genitourinary:  Positive for involuntary urination.  Musculoskeletal:  Positive for joint pain, gait problem, joint pain, joint swelling, myalgias, morning stiffness and myalgias. Negative for muscle weakness and muscle tenderness.  Skin:  Positive for color change. Negative for hair loss and sensitivity to sunlight.  Allergic/Immunologic: Negative for susceptible to infections.  Neurological:  Negative for dizziness and headaches.  Hematological:  Negative for swollen  glands.  Psychiatric/Behavioral:  Positive for sleep disturbance. Negative for depressed mood. The patient is not nervous/anxious.     PMFS History:  Patient Active Problem List   Diagnosis Date Noted   Encounter for chronic pain management 07/12/2022   Back pain 06/21/2022   B12 deficiency 07/30/2021   Macrocytic anemia 07/22/2021   Chronic kidney disease 07/10/2021   Type 2 diabetes mellitus with diabetic neuropathy, unspecified (HCC) 07/06/2021   Seasonal allergies 09/12/2020   Osteoarthritis of right knee 09/02/2020   type 2 dia with ckd 02/05/2020   Annual visit for general adult medical examination with abnormal findings 01/11/2020   Vitamin D deficiency 01/11/2020   Obesity (BMI 30.0-34.9) 09/03/2019   Dental caries 09/03/2019   Chronic elbow pain, left 06/23/2019   Hyperlipidemia LDL goal <100 05/29/2017   Pain in joint involving multiple sites 02/12/2016   Essential hypertension, benign 02/10/2016   Anxiety and depression 02/10/2016    Past Medical History:  Diagnosis Date   Acute left-sided low back pain with left-sided sciatica 11/04/2020   Anxiety    Arthritis    Depression 2001   Diabetes mellitus 2010   GERD (gastroesophageal reflux disease)    Hyperlipidemia    Hypertension 2005    Family History  Problem Relation Age of Onset   Stroke Mother    Past Surgical History:  Procedure Laterality Date   COLONOSCOPY N/A 07/07/2016   Procedure: COLONOSCOPY;  Surgeon: Malissa Hippo, MD;  Location: [facility];  Service: Endoscopy;  Laterality: N/A;  1030   FINGER SURGERY Left 1974   fifth , trauma on  job, had to replace/repair finger   HERNIA REPAIR  2006   x3, umbilical and bilateral hernia   Social History   Social History Narrative   Not on file   Immunization History  Administered Date(s) Administered   Fluad Quad(high Dose 65+) 09/02/2020, 07/17/2021, 09/15/2022   Influenza,inj,Quad PF,6+ Mos 07/14/2016, 08/25/2017, 08/30/2018, 07/02/2019    Moderna Covid-19 Vaccine Bivalent Booster 53yrs & up 10/01/2022   PFIZER(Purple Top)SARS-COV-2 Vaccination 01/25/2020, 02/19/2020, 09/28/2020, 05/29/2021, 09/25/2021   PNEUMOCOCCAL CONJUGATE-20 07/17/2021   Pfizer Covid-19 Vaccine Bivalent Booster 47yrs & up 09/25/2021   Pneumococcal Conjugate-13 10/08/2022   Pneumococcal Polysaccharide-23 02/10/2016   Rsv, Bivalent, Protein Subunit Rsvpref,pf Verdis Frederickson) 10/08/2022   Tdap 07/04/2021   Zoster Recombinat (Shingrix) 07/08/2021, 09/09/2021     Objective: Vital Signs: BP 122/78 (BP Location: Left Arm, Patient Position: Sitting, Cuff Size: Normal)   Pulse 86   Resp 14   Ht 5\' 8"  (1.727 m)   Wt 207 lb (93.9 kg)   BMI 31.47 kg/m    Physical Exam Vitals and nursing note reviewed.  Constitutional:      Appearance: He is well-developed.  HENT:     Head: Normocephalic and atraumatic.  Eyes:     Conjunctiva/sclera: Conjunctivae normal.     Pupils: Pupils are equal, round, and reactive to light.  Cardiovascular:     Rate and Rhythm: Normal rate and regular rhythm.     Heart sounds: Normal heart sounds.  Pulmonary:     Effort: Pulmonary effort is normal.     Breath sounds: Normal breath sounds.  Abdominal:     General: Bowel sounds are normal.     Palpations: Abdomen is soft.  Musculoskeletal:     Cervical back: Normal range of motion and neck supple.  Skin:    General: Skin is warm and dry.     Capillary Refill: Capillary refill takes less than 2 seconds.  Neurological:     Mental Status: He is alert and oriented to person, place, and time.  Psychiatric:        Behavior: Behavior normal.      Musculoskeletal Exam: Cervical spine was in good range of motion.  Shoulder joints, elbow joints, wrist joints, MCPs PIPs and DIPs been good range of motion with no synovitis.  He had discomfort range of motion of his shoulders, elbows, wrist joints.  He had painful range of motion of bilateral knee joints without any warmth swelling or  effusion.  He had tenderness over ankles but no swelling was noted.  He had tenderness across MTPs but no synovitis was noted.  CDAI Exam: CDAI Score: 11  Patient Global: 5 mm; Provider Global: 5 mm Swollen: 0 ; Tender: 12  Joint Exam 01/20/2023      Right  Left  Glenohumeral   Tender   Tender  Elbow   Tender   Tender  Wrist   Tender   Tender  MCP 2   Tender     PIP 4   Tender     Knee   Tender   Tender  Ankle   Tender   Tender     Investigation: No additional findings.  Imaging: XR Foot 2 Views Left  Result Date: 01/06/2023 First MTP, PIP and DIP narrowing was noted.  Subluxation of fifth MTP and fifth PIP joints was noted.  No intertarsal, tibiotalar or subtalar joint space narrowing was noted.  Inferior calcaneal spur was noted.  No erosive changes were noted. Impression: These findings are consistent with  osteoarthritis of the foot.  XR Foot 2 Views Right  Result Date: 01/06/2023 First MTP, PIP and DIP narrowing was noted.  Subluxation of fifth MTP joint was noted.  No intertarsal, tibiotalar or subtalar joint space narrowing was noted.  Inferior and posterior calcaneal spurs were noted.  No erosive changes were noted. Impression: These findings are consistent with osteoarthritis of the foot.  XR Hand 2 View Left  Result Date: 01/06/2023 CMC, PIP and DIP narrowing was noted.  Severe narrowing of fifth PIP joint with cystic changes were noted.  No MCP narrowing was noted.  Possible erosion was noted over the lateral aspect of base of second proximal phalanx.  No intercarpal or radiocarpal joint space narrowing was noted. Impression: These findings are consistent with osteoarthritis of the hand.  XR Hand 2 View Right  Result Date: 01/06/2023 CMC, PIP and DIP narrowing was noted.  First MCP narrowing and subluxation was noted.  No intercarpal radiocarpal joint space narrowing was noted.  No erosive changes were noted. Impression: These findings are consistent with osteoarthritis  of the hand.   Recent Labs: Lab Results  Component Value Date   WBC 12.6 (H) 10/23/2022   HGB 14.7 10/23/2022   PLT 292 10/23/2022   NA 135 10/23/2022   K 4.1 10/23/2022   CL 101 10/23/2022   CO2 27 10/23/2022   GLUCOSE 113 (H) 10/23/2022   BUN 22 10/23/2022   CREATININE 1.83 (H) 10/23/2022   BILITOT 0.5 09/15/2022   ALKPHOS 114 09/15/2022   AST 12 09/15/2022   ALT 10 09/15/2022   PROT 7.8 09/15/2022   ALBUMIN 4.5 09/15/2022   CALCIUM 9.4 10/23/2022   GFRAA 61 12/10/2020   January 06, 2023 ESR 11, anti-CCP> 250, uric acid 8.8, G6PD 12.5  September 15, 2022 rheumatoid factor 82.8  Speciality Comments: No specialty comments available.  Procedures:  No procedures performed Allergies: Ibuprofen, Lisinopril, and Strawberry [berry]   Assessment / Plan:     Visit Diagnoses: Rheumatoid arthritis involving multiple sites with positive rheumatoid factor (HCC) - Positive RF, positive anti-CCP, polyarthralgia and intermittent swelling in hands, knees and feet.  No synovitis was noted on the examination today.  I did detailed discussion with the patient regarding possible rheumatoid arthritis and positive rheumatoid factor and positive anti-CCP antibody.  Different treatment options and their side effects were discussed.  After reviewing indications side effects contraindications he was in agreement to proceed with hydroxychloroquine.  A handout was given and consent was taken.  Will be starting on hydroxychloroquine 200 mg p.o. daily due to low GFR.  Patient was counseled on the purpose, proper use, and adverse effects of hydroxychloroquine including nausea/diarrhea, skin rash, headaches, and sun sensitivity.  Advised patient to wear sunscreen once starting hydroxychloroquine to reduce risk of rash associated with sun sensitivity.  Discussed importance of annual eye exams while on hydroxychloroquine to monitor to ocular toxicity and discussed importance of frequent laboratory monitoring.   Provided patient with eye exam form for baseline ophthalmologic exam.  Reviewed risk for QTC prolongation when used in combination with other QTc prolonging agents (including but not limited to antiarrhythmics, macrolide antibiotics, flouroquinolones, tricyclic antidepressants, citalopram, specific antipsychotics, ondansetron, migraine triptans, and methadone). Provided patient with educational materials on hydroxychloroquine and answered all questions.  Patient consented to hydroxychloroquine. Will upload consent in the media tab.   High risk medication use -  G6PD normal.  He will start hydroxychloroquine 200 mg p.o. daily.  Will check labs in a month, 3 months  and then every 5 months.  Baseline eye examination to monitor  for ocular toxicity was advised and then annual examination was advised.  Information on immunization was placed in the AVS.  Primary osteoarthritis of both hands - Clinical and radiographic findings are consistent with osteoarthritis.  He gives history of intermittent swelling.  No synovitis was noted.  X-ray findings were discussed with the patient.  Primary osteoarthritis of both knees - Severe medial compartment narrowing of the right knee and severe lateral compartment narrowing of the left knee.  Followed by Dr. Romeo Apple.  X-ray findings were discussed with the patient.  Patient plans to have total knee replacement.  Primary osteoarthritis of both feet - Clinical and radiographic findings are consistent with osteoarthritis.  Patient complains of intermittent swelling.  No synovitis was noted today.  He had tenderness over ankles or MTPs.  DDD (degenerative disc disease), lumbar - Mild spondylosis and facet joint arthropathy was noted on the previous x-rays.  Patient has been going to physical therapy.  He has chronic discomfort.  Hyperuricemia-uric acid was elevated at 8.8.  He is on HCTZ.  He may benefit from switching to some other medication for hypertension.  At this point  he does not have any symptoms of gout.  Patient will discuss this further with Dr. Lodema Hong.  Essential hypertension, benign-blood pressure was normal today at 122/78.  Other medical problems are listed as follows:  Type 2 diabetes mellitus with diabetic neuropathy, unspecified whether long term insulin use (HCC)  Stage 3b chronic kidney disease (HCC) - Awaiting nephrology appointment.  Hyperlipidemia LDL goal <100  Macrocytic anemia  Vitamin D deficiency  B12 deficiency  Anxiety and depression  Seasonal allergies  BMI 29.0-29.9,adult  Orders: No orders of the defined types were placed in this encounter.  Meds ordered this encounter  Medications   hydroxychloroquine (PLAQUENIL) 200 MG tablet    Sig: Take 1 tablet (200 mg total) by mouth daily.    Dispense:  90 tablet    Refill:  0     Follow-Up Instructions: Return in about 2 months (around 03/22/2023) for Rheumatoid arthritis.   Pollyann Savoy, MD  Note - This record has been created using Animal nutritionist.  Chart creation errors have been sought, but may not always  have been located. Such creation errors do not reflect on  the standard of medical care.

## 2023-01-18 NOTE — Telephone Encounter (Signed)
I called patient to discuss that he does not need to stop hydroxychloroquine for the surgery.  He will have all the information on the hydroxychloroquine at the follow-up visit which he can share with Dr. Aline Brochure.  Patient voiced understanding.

## 2023-01-19 ENCOUNTER — Ambulatory Visit (HOSPITAL_COMMUNITY): Payer: Medicare HMO | Admitting: Physical Therapy

## 2023-01-19 ENCOUNTER — Encounter (HOSPITAL_COMMUNITY): Payer: Self-pay | Admitting: Physical Therapy

## 2023-01-19 DIAGNOSIS — M170 Bilateral primary osteoarthritis of knee: Secondary | ICD-10-CM | POA: Diagnosis not present

## 2023-01-19 DIAGNOSIS — M6281 Muscle weakness (generalized): Secondary | ICD-10-CM | POA: Diagnosis not present

## 2023-01-19 DIAGNOSIS — R29898 Other symptoms and signs involving the musculoskeletal system: Secondary | ICD-10-CM

## 2023-01-19 DIAGNOSIS — R2689 Other abnormalities of gait and mobility: Secondary | ICD-10-CM | POA: Diagnosis not present

## 2023-01-19 DIAGNOSIS — M19042 Primary osteoarthritis, left hand: Secondary | ICD-10-CM | POA: Diagnosis not present

## 2023-01-19 DIAGNOSIS — M5459 Other low back pain: Secondary | ICD-10-CM | POA: Diagnosis not present

## 2023-01-19 DIAGNOSIS — M19041 Primary osteoarthritis, right hand: Secondary | ICD-10-CM | POA: Diagnosis not present

## 2023-01-19 DIAGNOSIS — Z79899 Other long term (current) drug therapy: Secondary | ICD-10-CM | POA: Diagnosis not present

## 2023-01-19 DIAGNOSIS — M0579 Rheumatoid arthritis with rheumatoid factor of multiple sites without organ or systems involvement: Secondary | ICD-10-CM | POA: Diagnosis not present

## 2023-01-19 NOTE — Therapy (Signed)
OUTPATIENT PHYSICAL THERAPY THORACOLUMBAR TREATMENT   Patient Name: Norman Peters MRN: [id number] DOB:May 13, 1956, 67 y.o., male Today's Date: 01/19/2023  END OF SESSION:  PT End of Session - 01/19/23 [id number]     Visit Number 8    Number of Visits 12    Date for PT Re-Evaluation 01/26/23    Authorization Type Primary [employer]; Secondary Medicaid    Authorization Time Period 12/15/22-01/26/23    Authorization - Visit Number 8    Authorization - Number of Visits 12    Progress Note Due on Visit 10    PT Start Time 0821    PT Stop Time [id number]    PT Time Calculation (min) 38 min    Activity Tolerance Patient tolerated treatment well    Behavior During Therapy [facility] for tasks assessed/performed            Past Medical History:  Diagnosis Date   Acute left-sided low back pain with left-sided sciatica 11/04/2020   Anxiety    Arthritis    Depression 2001   Diabetes mellitus 2010   GERD (gastroesophageal reflux disease)    Hyperlipidemia    Hypertension 2005   Past Surgical History:  Procedure Laterality Date   COLONOSCOPY N/A 07/07/2016   Procedure: COLONOSCOPY;  Surgeon: Rogene [place], MD;  Location: [facility];  Service: Endoscopy;  Laterality: N/A;  1030   FINGER SURGERY Left 1974   fifth , trauma on job, had to replace/repair finger   HERNIA REPAIR  [id number]   x3, umbilical and bilateral hernia   Patient Active Problem List   Diagnosis Date Noted   Encounter for chronic pain management 07/12/2022   Back pain 06/21/2022   B12 deficiency 07/30/2021   Macrocytic anemia 07/22/2021   Chronic kidney disease 07/10/2021   Type 2 diabetes mellitus with diabetic neuropathy, unspecified ([place]) 07/06/2021   Seasonal allergies 09/12/2020   Osteoarthritis of right knee 09/02/2020   type 2 dia with ckd 02/05/2020   Annual visit for general adult medical examination with abnormal findings 01/11/2020   Vitamin D deficiency 01/11/2020   Obesity (BMI 30.0-34.9) 09/03/2019   Dental caries  09/03/2019   Chronic elbow pain, left 06/23/2019   Hyperlipidemia LDL goal <100 05/29/2017   Pain in joint involving multiple sites 02/12/2016   Essential hypertension, benign 02/10/2016   Anxiety and depression 02/10/2016    PCP: Tula Nakayama MD  REFERRING PROVIDER: Carole Civil, MD  REFERRING DIAG: M54.50,G89.29 (ICD-10-CM) - Chronic bilateral low back pain without sciatica  Rationale for Evaluation and Treatment: Rehabilitation  THERAPY DIAG:  Other low back pain  Muscle weakness (generalized)  Other abnormalities of gait and mobility  Other symptoms and signs involving the musculoskeletal system  ONSET DATE: 2-3 months  SUBJECTIVE:  SUBJECTIVE STATEMENT: Doing alright. Knees still sore but they're going to hurt.   Eval: Patient has had back pain and muscle relaxer's have been helping. Symptoms have been there for about 2-3 months but has been getting worse. Has been trying to do a little bit of work around the house but it must have bothered his back. Been doing old HEP some but hasn't been doing anything from before since its been so bad.   PERTINENT HISTORY:  Chronic LBP, DM, HLD, anxiety, depression  PAIN:  Are you having pain? Yes: NPRS scale: 1/10 low back, knees 5/10 Pain location: low back Pain description: stabbing, pressure Aggravating factors: sit to stand/transfers; sometimes bed mobility Relieving factors: changing positions  PRECAUTIONS: None  WEIGHT BEARING RESTRICTIONS: No  FALLS:  Has patient fallen in last 6 months? No   OCCUPATION: Retired  PLOF: Independent  PATIENT GOALS: back to be feeling better  NEXT MD VISIT: unsure  OBJECTIVE:   PATIENT SURVEYS: FOTO 55% function  SCREENING FOR RED FLAGS: Bowel or bladder incontinence: No Spinal  tumors: No Cauda equina syndrome: No Compression fracture: No Abdominal aneurysm: No  COGNITION: Overall cognitive status: Within functional limits for tasks assessed     SENSATION: WFL   POSTURE: rounded shoulders, forward head, decreased lumbar lordosis, and decreased thoracic kyphosis  PALPATION: TTP lower lumbar paraspinals; hypomobile lower thoracic and lumbar spine  LUMBAR ROM:   AROM eval  Flexion 25% limited  Extension 25% limited  Right lateral flexion 25% limited  Left lateral flexion 50% limited  Right rotation 25% limited  Left rotation 25% limited   (Blank rows = not tested)  LOWER EXTREMITY ROM:   lacking TKE bilaterally with LAQ  Active  Right eval Left eval  Hip flexion    Hip extension    Hip abduction    Hip adduction    Hip internal rotation    Hip external rotation    Knee flexion    Knee extension    Ankle dorsiflexion    Ankle plantarflexion    Ankle inversion    Ankle eversion     (Blank rows = not tested)  LOWER EXTREMITY MMT:    MMT Right eval Left eval  Hip flexion 4+ 4  Hip extension 4* 4*  Hip abduction 4- 4  Hip adduction    Hip internal rotation    Hip external rotation    Knee flexion 4 4  Knee extension 4+ 4+  Ankle dorsiflexion 5 5  Ankle plantarflexion    Ankle inversion    Ankle eversion     (Blank rows = not tested)*=pain  LUMBAR SPECIAL TESTS:  Slump test: Negative  FUNCTIONAL TESTS:  5 times sit to stand: 17.35 seconds without UE support, unsteady, relies LLE 2 minute walk test: 360 feet  GAIT: Distance walked: 360 feet Assistive device utilized: None Level of assistance: Complete Independence Comments: 2MWT, bilateral knee pain lacking TKE  TODAY'S TREATMENT:  DATE:  01/19/23 Press up 2x 10  Prone hip extension 1 x 15 bilateral  Prone alternating UE/LE lifts 1 x 15 bilateral   SLR with quad set 1 x 15 bilateral STS 1 x 10  Step up 4 inch 1 x 10 bilateral  Lateral step up 4 inch 1 x 10 bilateral   01/17/23 POE 2 x 30 second holds Press up 2x 10  Prone hip extension 1 x 10 Prone alternating UE/LE lifts 1 x 10  DKTC with heels on Brager ball 2 x 10 with 5 second holds Supine ab iso with Mitchener ball 1 x 10 with 5 second holds SLR with quad set 1 x 10 bilateral LAQ 1 x 10 with 5 second holds bilateral  Step up 4 inch 2 x 10 bilateral  Lateral step up 4 inch 1 x 10 bilateral   01/12/23 Seated:  Hamstring stretch x 30" x 3 Supine:  Piriformis stretch (figure-of-4) x 30" x 3  LTR x 5" x 10  DL bridging x 3" x 10 x 2 Prone  POE x 30" x 2  Press up x 3" x 10 Alternating UE/ LE raise x 10 Standing:  Abdominal isometrics with physioball, neutral pelvis x 3" x 10 x 2  Hip hinges with a dowel x 10 x 2  Pallof press x RTB x 10  01/10/23 Prone: POE 3 x 30 second holds Press up 2 x 10  Prone hip extension 2 x 10 bilateral  Alternating UE/ LE raise x 10   Supine: LTR 10 x 5 second holds SLR 2 x 10 bilateral  Bridge 1 x 20   Standing: STS 2 x 10 (elevated mat)  Standing Row BTB 2 x 10  Shoulder extension BTB 2 x 10    PATIENT EDUCATION:  Education details: 01/17/23: HEP; 01/04/23: HEP;  EVAL: Patient educated on exam findings, POC, scope of PT, HEP, and dosing of exercises . Person educated: Patient Education method: Explanation, Demonstration, and Handouts Education comprehension: verbalized understanding, returned demonstration, verbal cues required, and tactile cues required  HOME EXERCISE PROGRAM: Access Code: [email or web address] URL: [email or web address] 01/19/23 - Step Up  - 1 x daily - 7 x weekly - 1-3 sets - 10 reps - Lateral Step Up  - 1 x daily - 7 x weekly - 1-3 sets - 10 reps  01/10/23 - Standing Shoulder Row with Anchored Resistance  - 1-2 x daily - 7 x weekly - 2 sets - 10 reps - Shoulder extension with resistance - Neutral  - 1-2 x  daily - 7 x weekly - 2 sets - 10 reps  01/04/23 - Prone Hip Extension  - 1 x daily - 7 x weekly - 2 sets - 10 reps - Sit to Stand with Arms Crossed  - 1 x daily - 7 x weekly - 2 sets - 10 reps  12/29/22 - Supine Transversus Abdominis Bracing - Hands on Stomach  - 2 x daily - 7 x weekly - 1 sets - 10 reps - 5 second hold - Supine March  - 2 x daily - 7 x weekly - 2 sets - 10 reps - Supine Straight Leg Raises  - 2 x daily - 7 x weekly - 2 sets - 10 reps - Sidelying Hip Abduction  - 2 x daily - 7 x weekly - 2 sets - 10 reps - Supine Bridge  - 2 x daily - 7 x weekly - 2 sets - 10 reps - 5 second  hold   Date: 12/15/2022 - Prone Press Up On Elbows  - 3-5 x daily - 7 x weekly - 3 reps - 30 second hold - Prone Press Up  - 3-5 x daily - 7 x weekly - 2 sets - 10 reps - Seated Long Arc Quad  - 3 x daily - 7 x weekly - 2 sets - 10 reps - 5 second hold  ASSESSMENT:  CLINICAL IMPRESSION: Continued with lumbar extension exercises which are tolerated well. Patient able to complete increased reps of prone extension exercises and SLR with mild/moderate fatigue following. Performed stair training for functional strength and knee pain and patient completing with improving mechanics/ease but continued weakness present in quads. Patient will continue to benefit from physical therapy in order to improve function and reduce impairment.    OBJECTIVE IMPAIRMENTS: Abnormal gait, decreased activity tolerance, decreased balance, decreased endurance, decreased mobility, difficulty walking, decreased ROM, decreased strength, hypomobility, increased muscle spasms, impaired flexibility, improper body mechanics, postural dysfunction, and pain.   ACTIVITY LIMITATIONS: carrying, lifting, bending, standing, squatting, stairs, transfers, locomotion level, and caring for others  PARTICIPATION LIMITATIONS: meal prep, cleaning, laundry, shopping, community activity, and yard work  PERSONAL FACTORS: Fitness, Time since onset of  injury/illness/exacerbation, and 3+ comorbidities: Chronic LBP, DM, HLD, anxiety, depression  are also affecting patient's functional outcome.   REHAB POTENTIAL: Good  CLINICAL DECISION MAKING: Stable/uncomplicated  EVALUATION COMPLEXITY: Low   GOALS: Goals reviewed with patient? Yes  SHORT TERM GOALS: Target date: 01/05/2023    Patient will be independent with HEP in order to improve functional outcomes. Baseline:  Goal status: INITIAL  2.  Patient will report at least 25% improvement in symptoms for improved quality of life. Baseline:  Goal status: INITIAL    LONG TERM GOALS: Target date: 01/26/2023    Patient will report at least 75% improvement in symptoms for improved quality of life. Baseline:  Goal status: INITIAL  2.  Patient will improve FOTO score by at least 7 points in order to indicate improved tolerance to activity. Baseline: 55% function Goal status: INITIAL  3.  Patient will demonstrate at least 25% improvement in lumbar ROM in all restricted planes for improved ability to move trunk while completing chores. Baseline: see above Goal status: INITIAL  4.  Patient will be able to ambulate at least 410 feet in 2MWT in order to demonstrate improved tolerance to activity. Baseline: 360 feet Goal status: INITIAL  5.  Patient will demonstrate grade of 5/5 MMT grade in all tested musculature as evidence of improved strength to assist with stair ambulation and gait.   Baseline: see above Goal status: INITIAL  6.  Patient will be able to complete 5x STS in under 11.4 seconds in order to demo improved functional strength. Baseline: 17.35 seconds Goal status: INITIAL   PLAN:  PT FREQUENCY: 2x/week  PT DURATION: 6 weeks  PLANNED INTERVENTIONS: Therapeutic exercises, Therapeutic activity, Neuromuscular re-education, Balance training, Gait training, Patient/Family education, Joint manipulation, Joint mobilization, Stair training, Orthotic/Fit training, DME  instructions, Aquatic Therapy, Dry Needling, Electrical stimulation, Spinal manipulation, Spinal mobilization, Cryotherapy, Moist heat, Compression bandaging, scar mobilization, Splintting, Taping, Traction, Ultrasound, Ionotophoresis '4mg'$ /ml Dexamethasone, and Manual therapy  PLAN FOR NEXT SESSION: Continue POC and may progress as tolerated with emphasis on core and trunk flexibility and strength. Quad strength/functional strength   8:23 AM, 01/19/23 Mearl Latin PT, DPT Physical Therapist at [facility]

## 2023-01-20 ENCOUNTER — Ambulatory Visit (INDEPENDENT_AMBULATORY_CARE_PROVIDER_SITE_OTHER): Payer: Medicare HMO | Admitting: Rheumatology

## 2023-01-20 ENCOUNTER — Encounter: Payer: Self-pay | Admitting: Rheumatology

## 2023-01-20 VITALS — BP 122/78 | HR 86 | Resp 14 | Ht 68.0 in | Wt 207.0 lb

## 2023-01-20 DIAGNOSIS — M170 Bilateral primary osteoarthritis of knee: Secondary | ICD-10-CM | POA: Diagnosis not present

## 2023-01-20 DIAGNOSIS — E538 Deficiency of other specified B group vitamins: Secondary | ICD-10-CM

## 2023-01-20 DIAGNOSIS — E790 Hyperuricemia without signs of inflammatory arthritis and tophaceous disease: Secondary | ICD-10-CM | POA: Diagnosis not present

## 2023-01-20 DIAGNOSIS — D539 Nutritional anemia, unspecified: Secondary | ICD-10-CM

## 2023-01-20 DIAGNOSIS — M19041 Primary osteoarthritis, right hand: Secondary | ICD-10-CM | POA: Diagnosis not present

## 2023-01-20 DIAGNOSIS — F419 Anxiety disorder, unspecified: Secondary | ICD-10-CM

## 2023-01-20 DIAGNOSIS — I10 Essential (primary) hypertension: Secondary | ICD-10-CM

## 2023-01-20 DIAGNOSIS — M19071 Primary osteoarthritis, right ankle and foot: Secondary | ICD-10-CM

## 2023-01-20 DIAGNOSIS — M19072 Primary osteoarthritis, left ankle and foot: Secondary | ICD-10-CM

## 2023-01-20 DIAGNOSIS — F32A Depression, unspecified: Secondary | ICD-10-CM

## 2023-01-20 DIAGNOSIS — E1140 Type 2 diabetes mellitus with diabetic neuropathy, unspecified: Secondary | ICD-10-CM | POA: Diagnosis not present

## 2023-01-20 DIAGNOSIS — Z79899 Other long term (current) drug therapy: Secondary | ICD-10-CM | POA: Diagnosis not present

## 2023-01-20 DIAGNOSIS — J302 Other seasonal allergic rhinitis: Secondary | ICD-10-CM

## 2023-01-20 DIAGNOSIS — E559 Vitamin D deficiency, unspecified: Secondary | ICD-10-CM

## 2023-01-20 DIAGNOSIS — M5136 Other intervertebral disc degeneration, lumbar region: Secondary | ICD-10-CM | POA: Diagnosis not present

## 2023-01-20 DIAGNOSIS — E785 Hyperlipidemia, unspecified: Secondary | ICD-10-CM

## 2023-01-20 DIAGNOSIS — M0579 Rheumatoid arthritis with rheumatoid factor of multiple sites without organ or systems involvement: Secondary | ICD-10-CM | POA: Diagnosis not present

## 2023-01-20 DIAGNOSIS — Z6829 Body mass index (BMI) 29.0-29.9, adult: Secondary | ICD-10-CM

## 2023-01-20 DIAGNOSIS — M19042 Primary osteoarthritis, left hand: Secondary | ICD-10-CM

## 2023-01-20 DIAGNOSIS — N1832 Chronic kidney disease, stage 3b: Secondary | ICD-10-CM

## 2023-01-20 MED ORDER — HYDROXYCHLOROQUINE SULFATE 200 MG PO TABS: 200.0000 mg | ORAL_TABLET | Freq: Every day | ORAL | 0 refills | Status: DC

## 2023-01-20 NOTE — Patient Instructions (Addendum)
Hydroxychloroquine Tablets What is this medication? HYDROXYCHLOROQUINE (hye drox ee KLOR oh kwin) treats autoimmune conditions, such as rheumatoid arthritis and lupus. It works by slowing down an overactive immune system. It may also be used to prevent and treat malaria. It works by killing the parasite that causes malaria. It belongs to a group of medications called DMARDs. This medicine may be used for other purposes; ask your health care provider or pharmacist if you have questions. COMMON BRAND NAME(S): Plaquenil, Quineprox What should I tell my care team before I take this medication? They need to know if you have any of these conditions: Diabetes Eye disease, vision problems Frequently drink alcohol G6PD deficiency Heart disease Irregular heartbeat or rhythm Kidney disease Liver disease Porphyria Psoriasis An unusual or allergic reaction to hydroxychloroquine, other medications, foods, dyes, or preservatives Pregnant or trying to get pregnant Breastfeeding How should I use this medication? Take this medication by mouth with water. Take it as directed on the prescription label. Do not cut, crush, or chew this medication. Swallow the tablets whole. Take it with food. Do not take it more than directed. Take all of this medication unless your care team tells you to stop it early. Keep taking it even if you think you are better. Take products with antacids in them at a different time of day than this medication. Take this medication 4 hours before or 4 hours after antacids. Talk to your care team if you have questions. Talk to your care team about the use of this medication in children. While this medication may be prescribed for selected conditions, precautions do apply. Overdosage: If you think you have taken too much of this medicine contact a poison control center or emergency room at once. NOTE: This medicine is only for you. Do not share this medicine with others. What if I miss a  dose? If you miss a dose, take it as soon as you can. If it is almost time for your next dose, take only that dose. Do not take double or extra doses. What may interact with this medication? Do not take this medication with any of the following: Cisapride Dronedarone Pimozide Thioridazine This medication may also interact with the following: Ampicillin Antacids Cimetidine Cyclosporine Digoxin Kaolin Medications for diabetes, such as insulin, glipizide, glyburide Medications for seizures, such as carbamazepine, phenobarbital, phenytoin Mefloquine Methotrexate Other medications that cause heart rhythm changes Praziquantel This list may not describe all possible interactions. Give your health care provider a list of all the medicines, herbs, non-prescription drugs, or dietary supplements you use. Also tell them if you smoke, drink alcohol, or use illegal drugs. Some items may interact with your medicine. What should I watch for while using this medication? Visit your care team for regular checks on your progress. Tell your care team if your symptoms do not start to get better or if they get worse. You may need blood work done while you are taking this medication. If you take other medications that can affect heart rhythm, you may need more testing. Talk to your care team if you have questions. Your vision may be tested before and during use of this medication. Tell your care team right away if you have any change in your eyesight. This medication may cause serious skin reactions. They can happen weeks to months after starting the medication. Contact your care team right away if you notice fevers or flu-like symptoms with a rash. The rash may be red or purple and then turn  into blisters or peeling of the skin. Or, you might notice a red rash with swelling of the face, lips or lymph nodes in your neck or under your arms. If you or your family notice any changes in your behavior, such as new or  worsening depression, thoughts of harming yourself, anxiety, or other unusual or disturbing thoughts, or memory loss, call your care team right away. What side effects may I notice from receiving this medication? Side effects that you should report to your care team as soon as possible: Allergic reactions--skin rash, itching, hives, swelling of the face, lips, tongue, or throat Aplastic anemia--unusual weakness or fatigue, dizziness, headache, trouble breathing, increased bleeding or bruising Change in vision Heart rhythm changes--fast or irregular heartbeat, dizziness, feeling faint or lightheaded, chest pain, trouble breathing Infection--fever, chills, cough, or sore throat Low blood sugar (hypoglycemia)--tremors or shaking, anxiety, sweating, cold or clammy skin, confusion, dizziness, rapid heartbeat Muscle injury--unusual weakness or fatigue, muscle pain, dark yellow or brown urine, decrease in amount of urine Pain, tingling, or numbness in the hands or feet Rash, fever, and swollen lymph nodes Redness, blistering, peeling, or loosening of the skin, including inside the mouth Thoughts of suicide or self-harm, worsening mood, or feelings of depression Unusual bruising or bleeding Side effects that usually do not require medical attention (report to your care team if they continue or are bothersome): Diarrhea Headache Nausea Stomach pain Vomiting This list may not describe all possible side effects. Call your doctor for medical advice about side effects. You may report side effects to FDA at 1-800-FDA-1088. Where should I keep my medication? Keep out of the reach of children and pets. Store at room temperature up to 30 degrees C (86 degrees F). Protect from light. Get rid of any unused medication after the expiration date. To get rid of medications that are no longer needed or have expired: Take the medication to a medication take-back program. Check with your pharmacy or law enforcement  to find a location. If you cannot return the medication, check the label or package insert to see if the medication should be thrown out in the garbage or flushed down the toilet. If you are not sure, ask your care team. If it is safe to put it in the trash, empty the medication out of the container. Mix the medication with cat litter, dirt, coffee grounds, or other unwanted substance. Seal the mixture in a bag or container. Put it in the trash. NOTE: This sheet is a summary. It may not cover all possible information. If you have questions about this medicine, talk to your doctor, pharmacist, or health care provider.  2023 Elsevier/Gold Standard (2007-12-16 00:00:00)  Standing Labs We placed an order today for your standing lab work.   Please have your standing labs drawn in 1 month, 3 months and then every 5 months  Please have your labs drawn 2 weeks prior to your appointment so that the provider can discuss your lab results at your appointment, if possible.  Please note that you may see your imaging and lab results in [place] before we have reviewed them. We will contact you once all results are reviewed. Please allow our office up to 72 hours to thoroughly review all of the results before contacting the office for clarification of your results.  WALK-IN LAB HOURS  Monday through Thursday from 8:00 am -12:30 pm and 1:00 pm-5:00 pm and Friday from 8:00 am-12:00 pm.  Patients with office visits requiring labs will be  seen before walk-in labs.  You may encounter longer than normal wait times. Please allow additional time. Wait times may be shorter on  Monday and Thursday afternoons.  We do not book appointments for walk-in labs. We appreciate your patience and understanding with our staff.   Labs are drawn by [facility]. Please bring your co-pay at the time of your lab draw.  You may receive a bill from [facility] for your lab work.  Please note if you are on Hydroxychloroquine and and an order has  been placed for a Hydroxychloroquine level,  you will need to have it drawn 4 hours or more after your last dose.  If you wish to have your labs drawn at another location, please call the office 24 hours in advance so we can fax the orders.  The office is located at [address] [place], [place], [place] [address]   If you have any questions regarding directions or hours of operation,  please call [phone].   As a reminder, please drink plenty of water prior to coming for your lab work. Thanks!   Vaccines You are taking a medication(s) that can suppress your immune system.  The following immunizations are recommended: Flu annually Covid-19  RSV Td/Tdap (tetanus, diphtheria, pertussis) every 10 years Pneumonia (Prevnar 15 then Pneumovax 23 at least 1 year apart.  Alternatively, can take Prevnar 20 without needing additional dose) Shingrix: 2 doses from 4 weeks to 6 months apart  Please check with your PCP to make sure you are up to date.

## 2023-01-20 NOTE — Progress Notes (Signed)
Pharmacy Note  Subjective: Patient presents today to [facility] for follow up office visit.   Patient seen by the pharmacist for counseling on hydroxychloroquine for rheumatoid arthritis.    Objective: CMP     Component Value Date/Time   NA 135 10/23/2022 0242   NA 138 09/15/2022 1427   K 4.1 10/23/2022 0242   CL 101 10/23/2022 0242   CO2 27 10/23/2022 0242   GLUCOSE 113 (H) 10/23/2022 0242   BUN 22 10/23/2022 0242   BUN 20 09/15/2022 1427   CREATININE 1.83 (H) 10/23/2022 0242   CREATININE 1.47 (H) 08/25/2020 0927   CALCIUM 9.4 10/23/2022 0242   PROT 7.8 09/15/2022 1427   ALBUMIN 4.5 09/15/2022 1427   AST 12 09/15/2022 1427   ALT 10 09/15/2022 1427   ALKPHOS 114 09/15/2022 1427   BILITOT 0.5 09/15/2022 1427   GFRNONAA 40 (L) 10/23/2022 0242   GFRNONAA 50 (L) 08/25/2020 0927   GFRAA 61 12/10/2020 0933   GFRAA 58 (L) 08/25/2020 0927    CBC    Component Value Date/Time   WBC 12.6 (H) 10/23/2022 0242   RBC 5.08 10/23/2022 0242   HGB 14.7 10/23/2022 0242   HGB 7.4 (L) 07/17/2021 1058   HCT 45.0 10/23/2022 0242   HCT 20.6 (L) 07/17/2021 1058   PLT 292 10/23/2022 0242   PLT 151 07/17/2021 1058   MCV 88.6 10/23/2022 0242   MCV 107 (H) 07/17/2021 1058   MCH 28.9 10/23/2022 0242   MCHC 32.7 10/23/2022 0242   RDW 13.9 10/23/2022 0242   RDW 20.7 (H) 07/17/2021 1058   LYMPHSABS 3.4 08/16/2022 1342   LYMPHSABS 3.1 07/17/2021 1058   MONOABS 1.1 (H) 08/16/2022 1342   EOSABS 0.5 08/16/2022 1342   EOSABS 0.5 (H) 07/17/2021 1058   BASOSABS 0.1 08/16/2022 1342   BASOSABS 0.0 07/17/2021 1058    Assessment/Plan: Patient was counseled on the purpose, proper use, and adverse effects of hydroxychloroquine including nausea/diarrhea, skin rash, headaches, and sun sensitivity.  Advised patient to wear sunscreen once starting hydroxychloroquine to reduce risk of rash associated with sun sensitivity.  Discussed importance of annual eye exams while on hydroxychloroquine to  monitor to ocular toxicity and discussed importance of frequent laboratory monitoring.  Provided patient with eye exam form for baseline ophthalmologic exam.  Reviewed risk for QTC prolongation when used in combination with other QTc prolonging agents (including but not limited to antiarrhythmics, macrolide antibiotics, flouroquinolones, tricyclic antidepressants, citalopram, specific antipsychotics, ondansetron, migraine triptans, and methadone). Provided patient with educational materials on hydroxychloroquine and answered all questions.  Patient consented to hydroxychloroquine. Will upload consent in the media tab.    Dose will be Plaquenil 200 mg once daily (low dose due to renal function). He will f/u with ophthalmologist to see if he has had exams needed for Plaquenil monitoring (OCT and visual field test) completed  Knox Saliva, PharmD, MPH, BCPS, CPP Clinical Pharmacist (Rheumatology and Pulmonology)

## 2023-01-24 ENCOUNTER — Ambulatory Visit (HOSPITAL_COMMUNITY): Payer: Medicare HMO | Admitting: Physical Therapy

## 2023-01-24 DIAGNOSIS — M170 Bilateral primary osteoarthritis of knee: Secondary | ICD-10-CM | POA: Diagnosis not present

## 2023-01-24 DIAGNOSIS — M5459 Other low back pain: Secondary | ICD-10-CM

## 2023-01-24 DIAGNOSIS — M19042 Primary osteoarthritis, left hand: Secondary | ICD-10-CM | POA: Diagnosis not present

## 2023-01-24 DIAGNOSIS — R29898 Other symptoms and signs involving the musculoskeletal system: Secondary | ICD-10-CM | POA: Diagnosis not present

## 2023-01-24 DIAGNOSIS — Z79899 Other long term (current) drug therapy: Secondary | ICD-10-CM | POA: Diagnosis not present

## 2023-01-24 DIAGNOSIS — M19041 Primary osteoarthritis, right hand: Secondary | ICD-10-CM | POA: Diagnosis not present

## 2023-01-24 DIAGNOSIS — R2689 Other abnormalities of gait and mobility: Secondary | ICD-10-CM | POA: Diagnosis not present

## 2023-01-24 DIAGNOSIS — M6281 Muscle weakness (generalized): Secondary | ICD-10-CM | POA: Diagnosis not present

## 2023-01-24 DIAGNOSIS — M0579 Rheumatoid arthritis with rheumatoid factor of multiple sites without organ or systems involvement: Secondary | ICD-10-CM | POA: Diagnosis not present

## 2023-01-24 NOTE — Therapy (Signed)
OUTPATIENT PHYSICAL THERAPY THORACOLUMBAR TREATMENT   Patient Name: Norman Peters MRN: [id number] DOB:10-Jun-1956, 67 y.o., male Today's Date: 01/24/2023  END OF SESSION:  PT End of Session - 01/24/23 0857     Visit Number 9    Number of Visits 12    Date for PT Re-Evaluation 01/26/23    Authorization Type Primary [employer]; Secondary Medicaid    Authorization Time Period 12/15/22-01/26/23    Authorization - Visit Number 9    Authorization - Number of Visits 12    Progress Note Due on Visit 10    PT Start Time 0901    PT Stop Time 0940    PT Time Calculation (min) 39 min    Activity Tolerance Patient tolerated treatment well    Behavior During Therapy [facility] for tasks assessed/performed            Past Medical History:  Diagnosis Date   Acute left-sided low back pain with left-sided sciatica 11/04/2020   Anxiety    Arthritis    Depression 2001   Diabetes mellitus 2010   GERD (gastroesophageal reflux disease)    Hyperlipidemia    Hypertension 2005   Past Surgical History:  Procedure Laterality Date   COLONOSCOPY N/A 07/07/2016   Procedure: COLONOSCOPY;  Surgeon: Rogene Houston, MD;  Location: [facility];  Service: Endoscopy;  Laterality: N/A;  1030   FINGER SURGERY Left 1974   fifth , trauma on job, had to replace/repair finger   HERNIA REPAIR  [id number]   x3, umbilical and bilateral hernia   Patient Active Problem List   Diagnosis Date Noted   Encounter for chronic pain management 07/12/2022   Back pain 06/21/2022   B12 deficiency 07/30/2021   Macrocytic anemia 07/22/2021   Chronic kidney disease 07/10/2021   Type 2 diabetes mellitus with diabetic neuropathy, unspecified ([place]) 07/06/2021   Seasonal allergies 09/12/2020   Osteoarthritis of right knee 09/02/2020   type 2 dia with ckd 02/05/2020   Annual visit for general adult medical examination with abnormal findings 01/11/2020   Vitamin D deficiency 01/11/2020   Obesity (BMI 30.0-34.9) 09/03/2019   Dental caries  09/03/2019   Chronic elbow pain, left 06/23/2019   Hyperlipidemia LDL goal <100 05/29/2017   Pain in joint involving multiple sites 02/12/2016   Essential hypertension, benign 02/10/2016   Anxiety and depression 02/10/2016    PCP: Tula Nakayama MD  REFERRING PROVIDER: Carole Civil, MD  REFERRING DIAG: M54.50,G89.29 (ICD-10-CM) - Chronic bilateral low back pain without sciatica  Rationale for Evaluation and Treatment: Rehabilitation  THERAPY DIAG:  Other low back pain  ONSET DATE: 2-3 months  SUBJECTIVE:  SUBJECTIVE STATEMENT: Doing pretty good. No back pain right now. Exercises are helping.    Eval: Patient has had back pain and muscle relaxer's have been helping. Symptoms have been there for about 2-3 months but has been getting worse. Has been trying to do a little bit of work around the house but it must have bothered his back. Been doing old HEP some but hasn't been doing anything from before since its been so bad.   PERTINENT HISTORY:  Chronic LBP, DM, HLD, anxiety, depression  PAIN:  Are you having pain? Yes: NPRS scale: 0/10 Pain location: low back Pain description: stabbing, pressure Aggravating factors: sit to stand/transfers; sometimes bed mobility Relieving factors: changing positions  PRECAUTIONS: None  WEIGHT BEARING RESTRICTIONS: No  FALLS:  Has patient fallen in last 6 months? No   OCCUPATION: Retired  PLOF: Independent  PATIENT GOALS: back to be feeling better  NEXT MD VISIT: unsure  OBJECTIVE:   PATIENT SURVEYS: FOTO 55% function  SCREENING FOR RED FLAGS: Bowel or bladder incontinence: No Spinal tumors: No Cauda equina syndrome: No Compression fracture: No Abdominal aneurysm: No  COGNITION: Overall cognitive status: Within functional limits  for tasks assessed     SENSATION: WFL   POSTURE: rounded shoulders, forward head, decreased lumbar lordosis, and decreased thoracic kyphosis  PALPATION: TTP lower lumbar paraspinals; hypomobile lower thoracic and lumbar spine  LUMBAR ROM:   AROM eval  Flexion 25% limited  Extension 25% limited  Right lateral flexion 25% limited  Left lateral flexion 50% limited  Right rotation 25% limited  Left rotation 25% limited   (Blank rows = not tested)  LOWER EXTREMITY ROM:   lacking TKE bilaterally with LAQ  Active  Right eval Left eval  Hip flexion    Hip extension    Hip abduction    Hip adduction    Hip internal rotation    Hip external rotation    Knee flexion    Knee extension    Ankle dorsiflexion    Ankle plantarflexion    Ankle inversion    Ankle eversion     (Blank rows = not tested)  LOWER EXTREMITY MMT:    MMT Right eval Left eval  Hip flexion 4+ 4  Hip extension 4* 4*  Hip abduction 4- 4  Hip adduction    Hip internal rotation    Hip external rotation    Knee flexion 4 4  Knee extension 4+ 4+  Ankle dorsiflexion 5 5  Ankle plantarflexion    Ankle inversion    Ankle eversion     (Blank rows = not tested)*=pain  LUMBAR SPECIAL TESTS:  Slump test: Negative  FUNCTIONAL TESTS:  5 times sit to stand: 17.35 seconds without UE support, unsteady, relies LLE 2 minute walk test: 360 feet  GAIT: Distance walked: 360 feet Assistive device utilized: None Level of assistance: Complete Independence Comments: 2MWT, bilateral knee pain lacking TKE  TODAY'S TREATMENT:  DATE:  01/24/23 Ab brace 10 x 5" Ab march x20 Deadbug x20 Bridge 20 x 5"   Quadruped hip extension x10 each  Chair squat 2 x 10 (from elevated surface for decreased knee pain)  Hip abduction GTB 2 x 10 Hip extension GTB 2 x 10  01/19/23 Press up 2x 10  Prone  hip extension 1 x 15 bilateral  Prone alternating UE/LE lifts 1 x 15 bilateral  SLR with quad set 1 x 15 bilateral STS 1 x 10  Step up 4 inch 1 x 10 bilateral  Lateral step up 4 inch 1 x 10 bilateral   01/17/23 POE 2 x 30 second holds Press up 2x 10  Prone hip extension 1 x 10 Prone alternating UE/LE lifts 1 x 10  DKTC with heels on Yanik ball 2 x 10 with 5 second holds Supine ab iso with Noreen ball 1 x 10 with 5 second holds SLR with quad set 1 x 10 bilateral LAQ 1 x 10 with 5 second holds bilateral  Step up 4 inch 2 x 10 bilateral  Lateral step up 4 inch 1 x 10 bilateral   01/12/23 Seated:  Hamstring stretch x 30" x 3 Supine:  Piriformis stretch (figure-of-4) x 30" x 3  LTR x 5" x 10  DL bridging x 3" x 10 x 2 Prone  POE x 30" x 2  Press up x 3" x 10 Alternating UE/ LE raise x 10 Standing:  Abdominal isometrics with physioball, neutral pelvis x 3" x 10 x 2  Hip hinges with a dowel x 10 x 2  Pallof press x RTB x 10  01/10/23 Prone: POE 3 x 30 second holds Press up 2 x 10  Prone hip extension 2 x 10 bilateral  Alternating UE/ LE raise x 10   Supine: LTR 10 x 5 second holds SLR 2 x 10 bilateral  Bridge 1 x 20   Standing: STS 2 x 10 (elevated mat)  Standing Row BTB 2 x 10  Shoulder extension BTB 2 x 10    PATIENT EDUCATION:  Education details: 01/17/23: HEP; 01/04/23: HEP;  EVAL: Patient educated on exam findings, POC, scope of PT, HEP, and dosing of exercises . Person educated: Patient Education method: Explanation, Demonstration, and Handouts Education comprehension: verbalized understanding, returned demonstration, verbal cues required, and tactile cues required  HOME EXERCISE PROGRAM: Access Code: [email or web address] URL: [email or web address] 01/24/23 - Quadruped Alternating Leg Extensions  - 1-2 x daily - 7 x weekly - 2 sets - 10 reps - Hip Abduction with Resistance Loop  - 1-2 x daily - 7 x weekly - 2 sets - 10 reps - Hip Extension with Resistance Loop   - 1-2 x daily - 7 x weekly - 2 sets - 10 reps  01/19/23 - Step Up  - 1 x daily - 7 x weekly - 1-3 sets - 10 reps - Lateral Step Up  - 1 x daily - 7 x weekly - 1-3 sets - 10 reps  01/10/23 - Standing Shoulder Row with Anchored Resistance  - 1-2 x daily - 7 x weekly - 2 sets - 10 reps - Shoulder extension with resistance - Neutral  - 1-2 x daily - 7 x weekly - 2 sets - 10 reps  01/04/23 - Prone Hip Extension  - 1 x daily - 7 x weekly - 2 sets - 10 reps - Sit to Stand with Arms Crossed  - 1 x daily - 7 x weekly -  2 sets - 10 reps  12/29/22 - Supine Transversus Abdominis Bracing - Hands on Stomach  - 2 x daily - 7 x weekly - 1 sets - 10 reps - 5 second hold - Supine March  - 2 x daily - 7 x weekly - 2 sets - 10 reps - Supine Straight Leg Raises  - 2 x daily - 7 x weekly - 2 sets - 10 reps - Sidelying Hip Abduction  - 2 x daily - 7 x weekly - 2 sets - 10 reps - Supine Bridge  - 2 x daily - 7 x weekly - 2 sets - 10 reps - 5 second hold   Date: 12/15/2022 - Prone Press Up On Elbows  - 3-5 x daily - 7 x weekly - 3 reps - 30 second hold - Prone Press Up  - 3-5 x daily - 7 x weekly - 2 sets - 10 reps - Seated Long Arc Quad  - 3 x daily - 7 x weekly - 2 sets - 10 reps - 5 second hold  ASSESSMENT:  CLINICAL IMPRESSION: Patient well challenged with there ex progressions. Added dead bugs for core strengthening and quadruped hip extension for hip and multifidus strength. Patient educated on purpose and function of all added exercises. Patient required cueing to avoid hip drop during quadruped hip extension. Slight pain noted in knees with chair squats but improved with performing from elevated surface. Patient will continue to benefit from skilled therapy services to reduce remaining deficits and improve functional ability.    OBJECTIVE IMPAIRMENTS: Abnormal gait, decreased activity tolerance, decreased balance, decreased endurance, decreased mobility, difficulty walking, decreased ROM, decreased  strength, hypomobility, increased muscle spasms, impaired flexibility, improper body mechanics, postural dysfunction, and pain.   ACTIVITY LIMITATIONS: carrying, lifting, bending, standing, squatting, stairs, transfers, locomotion level, and caring for others  PARTICIPATION LIMITATIONS: meal prep, cleaning, laundry, shopping, community activity, and yard work  PERSONAL FACTORS: Fitness, Time since onset of injury/illness/exacerbation, and 3+ comorbidities: Chronic LBP, DM, HLD, anxiety, depression  are also affecting patient's functional outcome.   REHAB POTENTIAL: Good  CLINICAL DECISION MAKING: Stable/uncomplicated  EVALUATION COMPLEXITY: Low   GOALS: Goals reviewed with patient? Yes  SHORT TERM GOALS: Target date: 01/05/2023    Patient will be independent with HEP in order to improve functional outcomes. Baseline:  Goal status: INITIAL  2.  Patient will report at least 25% improvement in symptoms for improved quality of life. Baseline:  Goal status: INITIAL    LONG TERM GOALS: Target date: 01/26/2023    Patient will report at least 75% improvement in symptoms for improved quality of life. Baseline:  Goal status: INITIAL  2.  Patient will improve FOTO score by at least 7 points in order to indicate improved tolerance to activity. Baseline: 55% function Goal status: INITIAL  3.  Patient will demonstrate at least 25% improvement in lumbar ROM in all restricted planes for improved ability to move trunk while completing chores. Baseline: see above Goal status: INITIAL  4.  Patient will be able to ambulate at least 410 feet in 2MWT in order to demonstrate improved tolerance to activity. Baseline: 360 feet Goal status: INITIAL  5.  Patient will demonstrate grade of 5/5 MMT grade in all tested musculature as evidence of improved strength to assist with stair ambulation and gait.   Baseline: see above Goal status: INITIAL  6.  Patient will be able to complete 5x STS in  under 11.4 seconds in order to demo improved  functional strength. Baseline: 17.35 seconds Goal status: INITIAL   PLAN:  PT FREQUENCY: 2x/week  PT DURATION: 6 weeks  PLANNED INTERVENTIONS: Therapeutic exercises, Therapeutic activity, Neuromuscular re-education, Balance training, Gait training, Patient/Family education, Joint manipulation, Joint mobilization, Stair training, Orthotic/Fit training, DME instructions, Aquatic Therapy, Dry Needling, Electrical stimulation, Spinal manipulation, Spinal mobilization, Cryotherapy, Moist heat, Compression bandaging, scar mobilization, Splintting, Taping, Traction, Ultrasound, Ionotophoresis 4mg /ml Dexamethasone, and Manual therapy  PLAN FOR NEXT SESSION: Reassess. Continue POC and may progress as tolerated with emphasis on core and trunk flexibility and strength. Quad strength/functional strength  9:40 AM, 01/24/23 Josue Hector PT DPT  Physical Therapist with [facility]  [phone]

## 2023-01-26 ENCOUNTER — Ambulatory Visit (HOSPITAL_COMMUNITY): Payer: Medicare HMO | Admitting: Physical Therapy

## 2023-01-26 DIAGNOSIS — M5459 Other low back pain: Secondary | ICD-10-CM

## 2023-01-26 DIAGNOSIS — Z79899 Other long term (current) drug therapy: Secondary | ICD-10-CM | POA: Diagnosis not present

## 2023-01-26 DIAGNOSIS — M0579 Rheumatoid arthritis with rheumatoid factor of multiple sites without organ or systems involvement: Secondary | ICD-10-CM | POA: Diagnosis not present

## 2023-01-26 DIAGNOSIS — M19041 Primary osteoarthritis, right hand: Secondary | ICD-10-CM | POA: Diagnosis not present

## 2023-01-26 DIAGNOSIS — R29898 Other symptoms and signs involving the musculoskeletal system: Secondary | ICD-10-CM

## 2023-01-26 DIAGNOSIS — R2689 Other abnormalities of gait and mobility: Secondary | ICD-10-CM

## 2023-01-26 DIAGNOSIS — M6281 Muscle weakness (generalized): Secondary | ICD-10-CM

## 2023-01-26 DIAGNOSIS — M170 Bilateral primary osteoarthritis of knee: Secondary | ICD-10-CM | POA: Diagnosis not present

## 2023-01-26 DIAGNOSIS — M19042 Primary osteoarthritis, left hand: Secondary | ICD-10-CM | POA: Diagnosis not present

## 2023-01-26 NOTE — Therapy (Signed)
OUTPATIENT PHYSICAL THERAPY THORACOLUMBAR TREATMENT   Patient Name: Norman Peters MRN: [id number] DOB:1956/01/06, 67 y.o., male Today's Date: 01/26/2023  END OF SESSION:  PT End of Session - 01/26/23 0826     Visit Number 10    Number of Visits 12    Date for PT Re-Evaluation 01/26/23    Authorization Type Primary [employer]; Secondary Medicaid    Authorization Time Period 12/15/22-01/26/23    Authorization - Number of Visits 12    Progress Note Due on Visit 10    PT Start Time 0824    PT Stop Time 0900    PT Time Calculation (min) 36 min    Activity Tolerance Patient tolerated treatment well    Behavior During Therapy [facility] for tasks assessed/performed            Past Medical History:  Diagnosis Date   Acute left-sided low back pain with left-sided sciatica 11/04/2020   Anxiety    Arthritis    Depression 2001   Diabetes mellitus 2010   GERD (gastroesophageal reflux disease)    Hyperlipidemia    Hypertension 2005   Past Surgical History:  Procedure Laterality Date   COLONOSCOPY N/A 07/07/2016   Procedure: COLONOSCOPY;  Surgeon: Rogene Houston, MD;  Location: [facility];  Service: Endoscopy;  Laterality: N/A;  1030   FINGER SURGERY Left 1974   fifth , trauma on job, had to replace/repair finger   HERNIA REPAIR  [id number]   x3, umbilical and bilateral hernia   Patient Active Problem List   Diagnosis Date Noted   Encounter for chronic pain management 07/12/2022   Back pain 06/21/2022   B12 deficiency 07/30/2021   Macrocytic anemia 07/22/2021   Chronic kidney disease 07/10/2021   Type 2 diabetes mellitus with diabetic neuropathy, unspecified ([place]) 07/06/2021   Seasonal allergies 09/12/2020   Osteoarthritis of right knee 09/02/2020   type 2 dia with ckd 02/05/2020   Annual visit for general adult medical examination with abnormal findings 01/11/2020   Vitamin D deficiency 01/11/2020   Obesity (BMI 30.0-34.9) 09/03/2019   Dental caries 09/03/2019   Chronic elbow pain,  left 06/23/2019   Hyperlipidemia LDL goal <100 05/29/2017   Pain in joint involving multiple sites 02/12/2016   Essential hypertension, benign 02/10/2016   Anxiety and depression 02/10/2016    PCP: Tula Nakayama MD  REFERRING PROVIDER: Carole Civil, MD  REFERRING DIAG: M54.50,G89.29 (ICD-10-CM) - Chronic bilateral low back pain without sciatica  Rationale for Evaluation and Treatment: Rehabilitation  THERAPY DIAG:  Other low back pain  Muscle weakness (generalized)  Other abnormalities of gait and mobility  Other symptoms and signs involving the musculoskeletal system  ONSET DATE: 2-3 months  SUBJECTIVE:  SUBJECTIVE STATEMENT: Pt reports he has back pain sometimes but rarely and manageable.  No radiculopathy, reports 99% improved.  Pt feels he's ready for discharge and getting his knees fixed.    Eval: Patient has had back pain and muscle relaxer's have been helping. Symptoms have been there for about 2-3 months but has been getting worse. Has been trying to do a little bit of work around the house but it must have bothered his back. Been doing old HEP some but hasn't been doing anything from before since its been so bad.   PERTINENT HISTORY:  Chronic LBP, DM, HLD, anxiety, depression  PAIN:  Are you having pain? Yes: NPRS scale: 0/10 Pain location: low back Pain description: stabbing, pressure Aggravating factors: sit to stand/transfers; sometimes bed mobility Relieving factors: changing positions  PRECAUTIONS: None  WEIGHT BEARING RESTRICTIONS: No  FALLS:  Has patient fallen in last 6 months? No   OCCUPATION: Retired  PLOF: Independent  PATIENT GOALS: back to be feeling better  NEXT MD VISIT: unsure  OBJECTIVE:   PATIENT SURVEYS: FOTO 55% function  SCREENING  FOR RED FLAGS: Bowel or bladder incontinence: No Spinal tumors: No Cauda equina syndrome: No Compression fracture: No Abdominal aneurysm: No  COGNITION: Overall cognitive status: Within functional limits for tasks assessed     SENSATION: WFL   POSTURE: rounded shoulders, forward head, decreased lumbar lordosis, and decreased thoracic kyphosis  PALPATION: TTP lower lumbar paraspinals; hypomobile lower thoracic and lumbar spine  LUMBAR ROM:   AROM eval 01/26/23  Flexion 25% limited WNL  Extension 25% limited WNL  Right lateral flexion 25% limited WNL  Left lateral flexion 50% limited WNL  Right rotation 25% limited WNL  Left rotation 25% limited WNL   (Blank rows = not tested)  LOWER EXTREMITY ROM:   lacking TKE bilaterally with LAQ   LOWER EXTREMITY MMT:    MMT Right eval Left eval Right 01/26/23 Left 01/26/23  Hip flexion 4+ 4 5 5   Hip extension 4* 4* 5 5  Hip abduction 4- 4 5 5   Hip adduction      Hip internal rotation      Hip external rotation      Knee flexion 4 4 5 5   Knee extension 4+ 4+ 5 5  Ankle dorsiflexion 5 5 5 5   Ankle plantarflexion      Ankle inversion      Ankle eversion       (Blank rows = not tested)*=pain  LUMBAR SPECIAL TESTS:  Slump test: Negative  FUNCTIONAL TESTS:  5 times sit to stand: 01/26/23:  11 seconds no UE; evaluation:  17.35 seconds without UE support, unsteady, relies LLE 2 minute walk test: 01/26/23: 416 SPC; evaluation:  360 feet SPC  GAIT: Distance walked: 360 feet Assistive device utilized: None Level of assistance: Complete Independence Comments: 2MWT, bilateral knee pain lacking TKE  TODAY'S TREATMENT:  DATE:  01/26/23 REASSESSMENT 5 times sit to stand: 01/26/23:  11 seconds no UE; evaluation:  17.35 seconds without UE support, unsteady, relies LLE 2 minute walk test: 01/26/23: 416 SPC;  evaluation:  360 feet SPC FOTO:  94% Functional status (was 55% at evaluation) MMT all 5/5 Lumbar ROM now all [facility] without pain 01/24/23 Ab brace 10 x 5" Ab march x20 Deadbug x20 Bridge 20 x 5"   Quadruped hip extension x10 each  Chair squat 2 x 10 (from elevated surface for decreased knee pain)  Hip abduction GTB 2 x 10 Hip extension GTB 2 x 10  01/19/23 Press up 2x 10  Prone hip extension 1 x 15 bilateral  Prone alternating UE/LE lifts 1 x 15 bilateral  SLR with quad set 1 x 15 bilateral STS 1 x 10  Step up 4 inch 1 x 10 bilateral  Lateral step up 4 inch 1 x 10 bilateral   01/17/23 POE 2 x 30 second holds Press up 2x 10  Prone hip extension 1 x 10 Prone alternating UE/LE lifts 1 x 10  DKTC with heels on Bright ball 2 x 10 with 5 second holds Supine ab iso with [place]on ball 1 x 10 with 5 second holds SLR with quad set 1 x 10 bilateral LAQ 1 x 10 with 5 second holds bilateral  Step up 4 inch 2 x 10 bilateral  Lateral step up 4 inch 1 x 10 bilateral   01/12/23 Seated:  Hamstring stretch x 30" x 3 Supine:  Piriformis stretch (figure-of-4) x 30" x 3  LTR x 5" x 10  DL bridging x 3" x 10 x 2 Prone  POE x 30" x 2  Press up x 3" x 10 Alternating UE/ LE raise x 10 Standing:  Abdominal isometrics with physioball, neutral pelvis x 3" x 10 x 2  Hip hinges with a dowel x 10 x 2  Pallof press x RTB x 10  01/10/23 Prone: POE 3 x 30 second holds Press up 2 x 10  Prone hip extension 2 x 10 bilateral  Alternating UE/ LE raise x 10   Supine: LTR 10 x 5 second holds SLR 2 x 10 bilateral  Bridge 1 x 20   Standing: STS 2 x 10 (elevated mat)  Standing Row BTB 2 x 10  Shoulder extension BTB 2 x 10    PATIENT EDUCATION:  Education details: 01/17/23: HEP; 01/04/23: HEP;  EVAL: Patient educated on exam findings, POC, scope of PT, HEP, and dosing of exercises . Person educated: Patient Education method: Explanation, Demonstration, and Handouts Education comprehension: verbalized  understanding, returned demonstration, verbal cues required, and tactile cues required  HOME EXERCISE PROGRAM: Access Code: [email or web address] URL: [email or web address] 01/24/23 - Quadruped Alternating Leg Extensions  - 1-2 x daily - 7 x weekly - 2 sets - 10 reps - Hip Abduction with Resistance Loop  - 1-2 x daily - 7 x weekly - 2 sets - 10 reps - Hip Extension with Resistance Loop  - 1-2 x daily - 7 x weekly - 2 sets - 10 reps  01/19/23 - Step Up  - 1 x daily - 7 x weekly - 1-3 sets - 10 reps - Lateral Step Up  - 1 x daily - 7 x weekly - 1-3 sets - 10 reps  01/10/23 - Standing Shoulder Row with Anchored Resistance  - 1-2 x daily - 7 x weekly - 2 sets - 10 reps - Shoulder extension with resistance -  Neutral  - 1-2 x daily - 7 x weekly - 2 sets - 10 reps  01/04/23 - Prone Hip Extension  - 1 x daily - 7 x weekly - 2 sets - 10 reps - Sit to Stand with Arms Crossed  - 1 x daily - 7 x weekly - 2 sets - 10 reps  12/29/22 - Supine Transversus Abdominis Bracing - Hands on Stomach  - 2 x daily - 7 x weekly - 1 sets - 10 reps - 5 second hold - Supine March  - 2 x daily - 7 x weekly - 2 sets - 10 reps - Supine Straight Leg Raises  - 2 x daily - 7 x weekly - 2 sets - 10 reps - Sidelying Hip Abduction  - 2 x daily - 7 x weekly - 2 sets - 10 reps - Supine Bridge  - 2 x daily - 7 x weekly - 2 sets - 10 reps - 5 second hold   Date: 12/15/2022 - Prone Press Up On Elbows  - 3-5 x daily - 7 x weekly - 3 reps - 30 second hold - Prone Press Up  - 3-5 x daily - 7 x weekly - 2 sets - 10 reps - Seated Long Arc Quad  - 3 x daily - 7 x weekly - 2 sets - 10 reps - 5 second hold  ASSESSMENT:  CLINICAL IMPRESSION: Reassessment completed this session with patient meeting all goals and measures.  Pt is 99% subjectively improved and is ready for discharge at this point.  Pt has all HEP printouts and has no further questions or concerns.    OBJECTIVE IMPAIRMENTS: Abnormal gait, decreased activity tolerance,  decreased balance, decreased endurance, decreased mobility, difficulty walking, decreased ROM, decreased strength, hypomobility, increased muscle spasms, impaired flexibility, improper body mechanics, postural dysfunction, and pain.   ACTIVITY LIMITATIONS: carrying, lifting, bending, standing, squatting, stairs, transfers, locomotion level, and caring for others  PARTICIPATION LIMITATIONS: meal prep, cleaning, laundry, shopping, community activity, and yard work  PERSONAL FACTORS: Fitness, Time since onset of injury/illness/exacerbation, and 3+ comorbidities: Chronic LBP, DM, HLD, anxiety, depression  are also affecting patient's functional outcome.   REHAB POTENTIAL: Good  CLINICAL DECISION MAKING: Stable/uncomplicated  EVALUATION COMPLEXITY: Low   GOALS: Goals reviewed with patient? Yes  SHORT TERM GOALS: Target date: 01/05/2023    Patient will be independent with HEP in order to improve functional outcomes. Baseline:  Goal status: MET  2.  Patient will report at least 25% improvement in symptoms for improved quality of life. Baseline:  Goal status: MET    LONG TERM GOALS: Target date: 01/26/2023    Patient will report at least 75% improvement in symptoms for improved quality of life. Baseline:  Goal status: MET  2.  Patient will improve FOTO score by at least 7 points in order to indicate improved tolerance to activity. Baseline: 55% function Goal status: MET  3.  Patient will demonstrate at least 25% improvement in lumbar ROM in all restricted planes for improved ability to move trunk while completing chores. Baseline: see above Goal status: MET  4.  Patient will be able to ambulate at least 410 feet in 2MWT in order to demonstrate improved tolerance to activity. Baseline: 360 feet Goal status: MET  5.  Patient will demonstrate grade of 5/5 MMT grade in all tested musculature as evidence of improved strength to assist with stair ambulation and gait.   Baseline:  see above Goal status: MET  6.  Patient will be able to complete 5x STS in under 11.4 seconds in order to demo improved functional strength. Baseline: 17.35 seconds Goal status: MET   PLAN:  PT FREQUENCY: 2x/week  PT DURATION: 6 weeks  PLANNED INTERVENTIONS: Therapeutic exercises, Therapeutic activity, Neuromuscular re-education, Balance training, Gait training, Patient/Family education, Joint manipulation, Joint mobilization, Stair training, Orthotic/Fit training, DME instructions, Aquatic Therapy, Dry Needling, Electrical stimulation, Spinal manipulation, Spinal mobilization, Cryotherapy, Moist heat, Compression bandaging, scar mobilization, Splintting, Taping, Traction, Ultrasound, Ionotophoresis 4mg /ml Dexamethasone, and Manual therapy  PLAN FOR NEXT SESSION: Discharge  8:57 AM, 01/26/23 Teena Irani, PTA/CLT Short Ph: [phone]

## 2023-01-27 ENCOUNTER — Ambulatory Visit: Payer: Medicare HMO | Admitting: Rheumatology

## 2023-01-29 ENCOUNTER — Other Ambulatory Visit: Payer: Self-pay | Admitting: Nurse Practitioner

## 2023-01-29 ENCOUNTER — Other Ambulatory Visit: Payer: Self-pay | Admitting: Family Medicine

## 2023-01-31 ENCOUNTER — Encounter (HOSPITAL_COMMUNITY): Payer: Medicare HMO

## 2023-02-02 ENCOUNTER — Encounter (HOSPITAL_COMMUNITY): Payer: Medicare HMO | Admitting: Physical Therapy

## 2023-02-03 NOTE — Telephone Encounter (Signed)
Pt called in regards to med refill. He is out of this medication.

## 2023-02-04 NOTE — Telephone Encounter (Signed)
Med was d/c by his PCP. Pt schedule an appt

## 2023-02-09 MED ORDER — TRAMADOL HCL 50 MG PO TABS: 50.0000 mg | ORAL_TABLET | Freq: Two times a day (BID) | ORAL | 0 refills | Status: DC | PRN

## 2023-02-09 NOTE — Addendum Note (Signed)
Addended by: Fayrene Helper on: 02/09/2023 05:52 PM   Modules accepted: Orders

## 2023-02-12 ENCOUNTER — Other Ambulatory Visit: Payer: Self-pay | Admitting: Family Medicine

## 2023-02-14 ENCOUNTER — Other Ambulatory Visit (HOSPITAL_COMMUNITY): Payer: Self-pay

## 2023-02-14 ENCOUNTER — Other Ambulatory Visit: Payer: Self-pay

## 2023-02-14 MED ORDER — BUSPIRONE HCL 5 MG PO TABS
5.0000 mg | ORAL_TABLET | Freq: Two times a day (BID) | ORAL | 1 refills | Status: DC
  Filled 2023-02-14: qty 60, 30d supply, fill #0
  Filled 2023-04-19: qty 60, 30d supply, fill #1

## 2023-02-15 ENCOUNTER — Ambulatory Visit: Payer: Medicare HMO | Admitting: Rheumatology

## 2023-02-15 ENCOUNTER — Other Ambulatory Visit: Payer: Self-pay | Admitting: Family Medicine

## 2023-02-15 DIAGNOSIS — R062 Wheezing: Secondary | ICD-10-CM

## 2023-02-15 DIAGNOSIS — J302 Other seasonal allergic rhinitis: Secondary | ICD-10-CM

## 2023-02-22 ENCOUNTER — Other Ambulatory Visit: Payer: Self-pay | Admitting: *Deleted

## 2023-02-22 ENCOUNTER — Ambulatory Visit: Payer: Medicare HMO | Admitting: Rheumatology

## 2023-02-22 DIAGNOSIS — Z79899 Other long term (current) drug therapy: Secondary | ICD-10-CM

## 2023-02-22 NOTE — Progress Notes (Signed)
Patient contacted the office and requested lab orders to be sent to Dr. Anthony Sar office. Lab orders faxed as requested.

## 2023-02-23 DIAGNOSIS — Z79899 Other long term (current) drug therapy: Secondary | ICD-10-CM | POA: Diagnosis not present

## 2023-02-24 ENCOUNTER — Ambulatory Visit (INDEPENDENT_AMBULATORY_CARE_PROVIDER_SITE_OTHER): Payer: Medicare HMO | Admitting: Family Medicine

## 2023-02-24 VITALS — BP 150/90 | HR 88 | Resp 16 | Ht 68.0 in | Wt 212.0 lb

## 2023-02-24 DIAGNOSIS — M0579 Rheumatoid arthritis with rheumatoid factor of multiple sites without organ or systems involvement: Secondary | ICD-10-CM

## 2023-02-24 DIAGNOSIS — E785 Hyperlipidemia, unspecified: Secondary | ICD-10-CM

## 2023-02-24 DIAGNOSIS — E669 Obesity, unspecified: Secondary | ICD-10-CM

## 2023-02-24 DIAGNOSIS — F419 Anxiety disorder, unspecified: Secondary | ICD-10-CM

## 2023-02-24 DIAGNOSIS — E1140 Type 2 diabetes mellitus with diabetic neuropathy, unspecified: Secondary | ICD-10-CM | POA: Diagnosis not present

## 2023-02-24 DIAGNOSIS — I10 Essential (primary) hypertension: Secondary | ICD-10-CM

## 2023-02-24 DIAGNOSIS — Z79899 Other long term (current) drug therapy: Secondary | ICD-10-CM | POA: Diagnosis not present

## 2023-02-24 DIAGNOSIS — M2550 Pain in unspecified joint: Secondary | ICD-10-CM

## 2023-02-24 DIAGNOSIS — E66811 Obesity, class 1: Secondary | ICD-10-CM

## 2023-02-24 DIAGNOSIS — E790 Hyperuricemia without signs of inflammatory arthritis and tophaceous disease: Secondary | ICD-10-CM | POA: Diagnosis not present

## 2023-02-24 DIAGNOSIS — F32A Depression, unspecified: Secondary | ICD-10-CM

## 2023-02-24 LAB — CBC WITH DIFFERENTIAL/PLATELET
Basophils Absolute: 0.1 10*3/uL (ref 0.0–0.2)
Basos: 1 %
EOS (ABSOLUTE): 0.3 10*3/uL (ref 0.0–0.4)
Eos: 3 %
Hematocrit: 44.9 % (ref 37.5–51.0)
Hemoglobin: 14.6 g/dL (ref 13.0–17.7)
Immature Grans (Abs): 0 10*3/uL (ref 0.0–0.1)
Immature Granulocytes: 0 %
Lymphocytes Absolute: 3.4 10*3/uL — ABNORMAL HIGH (ref 0.7–3.1)
Lymphs: 34 %
MCH: 28.9 pg (ref 26.6–33.0)
MCHC: 32.5 g/dL (ref 31.5–35.7)
MCV: 89 fL (ref 79–97)
Monocytes Absolute: 0.8 10*3/uL (ref 0.1–0.9)
Monocytes: 8 %
Neutrophils Absolute: 5.3 10*3/uL (ref 1.4–7.0)
Neutrophils: 54 %
Platelets: 288 10*3/uL (ref 150–450)
RBC: 5.05 x10E6/uL (ref 4.14–5.80)
RDW: 13.6 % (ref 11.6–15.4)
WBC: 10 10*3/uL (ref 3.4–10.8)

## 2023-02-24 LAB — CMP14+EGFR
ALT: 24 IU/L (ref 0–44)
AST: 18 IU/L (ref 0–40)
Albumin/Globulin Ratio: 1.4 (ref 1.2–2.2)
Albumin: 4.3 g/dL (ref 3.9–4.9)
Alkaline Phosphatase: 95 IU/L (ref 44–121)
BUN/Creatinine Ratio: 9 — ABNORMAL LOW (ref 10–24)
BUN: 14 mg/dL (ref 8–27)
Bilirubin Total: 0.4 mg/dL (ref 0.0–1.2)
CO2: 22 mmol/L (ref 20–29)
Calcium: 9.6 mg/dL (ref 8.6–10.2)
Chloride: 104 mmol/L (ref 96–106)
Creatinine, Ser: 1.52 mg/dL — ABNORMAL HIGH (ref 0.76–1.27)
Globulin, Total: 3 g/dL (ref 1.5–4.5)
Glucose: 86 mg/dL (ref 70–99)
Potassium: 5 mmol/L (ref 3.5–5.2)
Sodium: 142 mmol/L (ref 134–144)
Total Protein: 7.3 g/dL (ref 6.0–8.5)
eGFR: 50 mL/min/{1.73_m2} — ABNORMAL LOW (ref >59)

## 2023-02-24 MED ORDER — ALLOPURINOL 300 MG PO TABS: 300.0000 mg | ORAL_TABLET | Freq: Every day | ORAL | 2 refills | Status: DC

## 2023-02-24 MED ORDER — OLMESARTAN MEDOXOMIL 20 MG PO TABS: 20.0000 mg | ORAL_TABLET | Freq: Every day | ORAL | 2 refills | Status: DC

## 2023-02-24 NOTE — Patient Instructions (Addendum)
F/U in 5 to 6 weeks re evaluate blood [pressure and labs  New additional medication for blood pressure is olmesartan 20 mg one daily  New medication to lower uric acid is allopurinol 300 mg one daily  Non fasting chem 7 and EGFr and uric acid level 3 to 5 days before next visit with me  We will find Eye Specialist to do needed exam and let you know( Nurse pls contact Dr Joycelyn Man office where he goes, and see if this particular exam is done there, fax paper with the details)  Thanks for choosing [facility], we consider it a privelige to serve you.

## 2023-02-24 NOTE — Progress Notes (Signed)
Creatinine is elevated and stable.  CBC is normal.  Please forward results to patient's PCP.

## 2023-02-28 ENCOUNTER — Encounter: Payer: Self-pay | Admitting: Family Medicine

## 2023-02-28 DIAGNOSIS — Z79899 Other long term (current) drug therapy: Secondary | ICD-10-CM | POA: Insufficient documentation

## 2023-02-28 DIAGNOSIS — E790 Hyperuricemia without signs of inflammatory arthritis and tophaceous disease: Secondary | ICD-10-CM | POA: Insufficient documentation

## 2023-02-28 DIAGNOSIS — E1165 Type 2 diabetes mellitus with hyperglycemia: Secondary | ICD-10-CM | POA: Diagnosis not present

## 2023-02-28 DIAGNOSIS — E1122 Type 2 diabetes mellitus with diabetic chronic kidney disease: Secondary | ICD-10-CM | POA: Diagnosis not present

## 2023-02-28 DIAGNOSIS — M0579 Rheumatoid arthritis with rheumatoid factor of multiple sites without organ or systems involvement: Secondary | ICD-10-CM | POA: Insufficient documentation

## 2023-02-28 NOTE — Assessment & Plan Note (Addendum)
Needs monitoring of liver , kidney and blood count regularly per guidelines of Rheumatology also Eye exam as started on paquinil  in 02/2023

## 2023-02-28 NOTE — Assessment & Plan Note (Signed)
Neds daily allopurinol to lower level

## 2023-02-28 NOTE — Assessment & Plan Note (Addendum)
Uncontrolled add olmesartan with close follow up DASH diet and commitment to daily physical activity for a minimum of 30 minutes discussed and encouraged, as a part of hypertension management. The importance of attaining a healthy weight is also discussed.     02/24/2023   11:58 AM 02/24/2023   11:27 AM 01/20/2023   10:55 AM 01/06/2023    9:42 AM 01/06/2023    9:36 AM 11/24/2022   10:55 AM 11/19/2022    9:09 AM  BP/Weight  Systolic BP 150 143 122 126 134 140 130  Diastolic BP 90 82 78 79 84 77 75  Wt. (Lbs)  212 207  208 207 209.8  BMI  32.23 kg/m2 31.47 kg/m2  29.84 kg/m2 31.47 kg/m2 31.9 kg/m2

## 2023-02-28 NOTE — Assessment & Plan Note (Signed)
  Patient re-educated about  the importance of commitment to a  minimum of 150 minutes of exercise per week as able.  The importance of healthy food choices with portion control discussed, as well as eating regularly and within a 12 hour window most days. The need to choose "clean , Kindt" food 50 to 75% of the time is discussed, as well as to make water the primary drink and set a goal of 64 ounces water daily.       02/24/2023   11:27 AM 01/20/2023   10:55 AM 01/06/2023    9:36 AM  Weight /BMI  Weight 212 lb 207 lb 208 lb  Height  (1.727 m)  (1.727 m)  (1.778 m)  BMI 32.23 kg/m2 31.47 kg/m2 29.84 kg/m2

## 2023-02-28 NOTE — Assessment & Plan Note (Signed)
Hyperlipidemia:Low fat diet discussed and encouraged.   Lipid Panel  Lab Results  Component Value Date   CHOL 108 09/15/2022   HDL 34 (L) 09/15/2022   LDLCALC 50 09/15/2022   TRIG 133 09/15/2022   CHOLHDL 3.2 09/15/2022     Updated lab needed at/ before next visit.

## 2023-02-28 NOTE — Assessment & Plan Note (Signed)
controlled, managed by [name] is reminded of the importance of commitment to daily physical activity for 30 minutes or more, as able and the need to limit carbohydrate intake to 30 to 60 grams per meal to help with blood sugar control.   The need to take medication as prescribed, test blood sugar as directed, and to call between visits if there is a concern that blood sugar is uncontrolled is also discussed.   Norman Peters is reminded of the importance of daily foot exam, annual eye examination, and good blood sugar, blood pressure and cholesterol control.     Latest Ref Rng & Units 02/23/2023    9:16 AM 11/19/2022    9:23 AM 10/23/2022    2:42 AM 09/15/2022    2:27 PM 07/20/2022    1:15 PM  Diabetic Labs  HbA1c 4.0 - 5.6 %  6.8    6.8   Chol 100 - 199 mg/dL    161    HDL >09 mg/dL    34    Calc LDL 0 - 99 mg/dL    50    Triglycerides 0 - 149 mg/dL    604    Creatinine 5.40 - 1.27 mg/dL 9.81   1.91  4.78        02/24/2023   11:58 AM 02/24/2023   11:27 AM 01/20/2023   10:55 AM 01/06/2023    9:42 AM 01/06/2023    9:36 AM 11/24/2022   10:55 AM 11/19/2022    9:09 AM  BP/Weight  Systolic BP 150 143 122 126 134 140 130  Diastolic BP 90 82 78 79 84 77 75  Wt. (Lbs)  212 207  208 207 209.8  BMI  32.23 kg/m2 31.47 kg/m2  29.84 kg/m2 31.47 kg/m2 31.9 kg/m2      Latest Ref Rng & Units 01/15/2022    1:00 PM 07/01/2021   12:00 AM  Foot/eye exam completion dates  Eye Exam No Retinopathy  Retinopathy      Foot Form Completion  Done      This result is from an external source.

## 2023-02-28 NOTE — Progress Notes (Signed)
Norman Peters     MRN: [id number]      DOB: Mar 14, 1956   HPI Norman Peters is here for follow up and re-evaluation of chronic medical conditions, medication management and review of any available recent lab and radiology data.  Preventive health is updated, specifically  Cancer screening and Immunization.   Recently started on plquinil for mangement of rheumatoid arthritis and needs eye exam for monitoring The PT denies any adverse reactions to current medications since the last visit.  There are no new concerns.  There are no specific complaints   ROS Denies recent fever or chills. Denies sinus pressure, nasal congestion, ear pain or sore throat. Denies chest congestion, productive cough or wheezing. Denies chest pains, palpitations and leg swelling Denies abdominal pain, nausea, vomiting,diarrhea or constipation.   Denies dysuria, frequency, hesitancy or incontinence. Generalized  joint pain, swelling and limitation in mobility. Denies headaches, seizures, numbness, or tingling. Denies depression, anxiety or insomnia. Denies skin break down or rash.   PE  BP (!) 150/90   Pulse 88   Resp 16   Ht  (1.727 m)   Wt 212 lb (96.2 kg)   SpO2 94%   BMI 32.23 kg/m   Patient alert and oriented and in no cardiopulmonary distress.  HEENT: No facial asymmetry, EOMI,     Neck supple .  Chest: Clear to auscultation bilaterally.  CVS: S1, S2 no murmurs, no S3.Regular rate.  ABD: Soft non tender.   Ext: No edema  MS: Decreased ROM spine, shoulders, hips and knees.  Skin: Intact, no ulcerations or rash noted.  Psych: Good eye contact, normal affect. Memory intact not anxious or depressed appearing.  CNS: CN 2-12 intact, power,  normal throughout.no focal deficits noted.   Assessment & Plan  Essential hypertension, benign Uncontrolled add olmesartan with close follow up DASH diet and commitment to daily physical activity for a minimum of 30 minutes discussed and  encouraged, as a part of hypertension management. The importance of attaining a healthy weight is also discussed.     02/24/2023   11:58 AM 02/24/2023   11:27 AM 01/20/2023   10:55 AM 01/06/2023    9:42 AM 01/06/2023    9:36 AM 11/24/2022   10:55 AM 11/19/2022    9:09 AM  BP/Weight  Systolic BP 150 143 122 126 134 140 130  Diastolic BP 90 82 78 79 84 77 75  Wt. (Lbs)  212 207  208 207 209.8  BMI  32.23 kg/m2 31.47 kg/m2  29.84 kg/m2 31.47 kg/m2 31.9 kg/m2        Type 2 diabetes mellitus with diabetic neuropathy, unspecified ([facility]) controlled, managed by [name] is reminded of the importance of commitment to daily physical activity for 30 minutes or more, as able and the need to limit carbohydrate intake to 30 to 60 grams per meal to help with blood sugar control.   The need to take medication as prescribed, test blood sugar as directed, and to call between visits if there is a concern that blood sugar is uncontrolled is also discussed.   Norman Peters is reminded of the importance of daily foot exam, annual eye examination, and good blood sugar, blood pressure and cholesterol control.     Latest Ref Rng & Units 02/23/2023    9:16 AM 11/19/2022    9:23 AM 10/23/2022    2:42 AM 09/15/2022    2:27 PM 07/20/2022    1:15 PM  Diabetic Labs  HbA1c 4.0 -  5.6 %  6.8    6.8   Chol 100 - 199 mg/dL    098    HDL >11 mg/dL    34    Calc LDL 0 - 99 mg/dL    50    Triglycerides 0 - 149 mg/dL    914    Creatinine 7.82 - 1.27 mg/dL 9.56   2.13  0.86        02/24/2023   11:58 AM 02/24/2023   11:27 AM 01/20/2023   10:55 AM 01/06/2023    9:42 AM 01/06/2023    9:36 AM 11/24/2022   10:55 AM 11/19/2022    9:09 AM  BP/Weight  Systolic BP 150 143 122 126 134 140 130  Diastolic BP 90 82 78 79 84 77 75  Wt. (Lbs)  212 207  208 207 209.8  BMI  32.23 kg/m2 31.47 kg/m2  29.84 kg/m2 31.47 kg/m2 31.9 kg/m2      Latest Ref Rng & Units 01/15/2022    1:00 PM 07/01/2021   12:00 AM  Foot/eye exam  completion dates  Eye Exam No Retinopathy  Retinopathy      Foot Form Completion  Done      This result is from an external source.        Hyperlipidemia LDL goal <100 Hyperlipidemia:Low fat diet discussed and encouraged.   Lipid Panel  Lab Results  Component Value Date   CHOL 108 09/15/2022   HDL 34 (L) 09/15/2022   LDLCALC 50 09/15/2022   TRIG 133 09/15/2022   CHOLHDL 3.2 09/15/2022     Updated lab needed at/ before next visit.   High risk medication use Needs monitoring of liver , kidney and blood count regularly per guidelines of Rheumatology also Eye exam as started on paquinil  in 02/2023   Rheumatoid arthritis involving multiple sites with positive rheumatoid factor Started on definitive treatment in 02/2023,on plaquenil  needs monitoring  Obesity (BMI 30.0-34.9)  Patient re-educated about  the importance of commitment to a  minimum of 150 minutes of exercise per week as able.  The importance of healthy food choices with portion control discussed, as well as eating regularly and within a 12 hour window most days. The need to choose "clean , [place]" food 50 to 75% of the time is discussed, as well as to make water the primary drink and set a goal of 64 ounces water daily.       02/24/2023   11:27 AM 01/20/2023   10:55 AM 01/06/2023    9:36 AM  Weight /BMI  Weight 212 lb 207 lb 208 lb  Height  (1.727 m)  (1.727 m)  (1.778 m)  BMI 32.23 kg/m2 31.47 kg/m2 29.84 kg/m2      Anxiety and depression Controlled, no change in medication   Hyperuricemia Neds daily allopurinol to lower level

## 2023-02-28 NOTE — Assessment & Plan Note (Addendum)
Started on definitive treatment in 02/2023,on plaquenil  needs monitoring

## 2023-02-28 NOTE — Assessment & Plan Note (Signed)
Controlled, no change in medication  

## 2023-03-02 ENCOUNTER — Other Ambulatory Visit: Payer: Self-pay | Admitting: Nurse Practitioner

## 2023-03-02 ENCOUNTER — Other Ambulatory Visit: Payer: Self-pay

## 2023-03-02 MED ORDER — AMLODIPINE-OLMESARTAN 10-20 MG PO TABS: 1.0000 | ORAL_TABLET | Freq: Every day | ORAL | 2 refills | Status: DC

## 2023-03-09 ENCOUNTER — Telehealth: Payer: Self-pay | Admitting: Family Medicine

## 2023-03-09 NOTE — Telephone Encounter (Signed)
Shelly, pharm tech w [facility] called in on patient behalf.  Need verification on what  med pt should be taking      amlodipine-olmesartan (AZOR) 10-20 MG tablet   olmesartan (BENICAR) 20 MG tablet

## 2023-03-11 NOTE — Telephone Encounter (Signed)
[facility] aware azor 10-20 is to be sent and olmesartan 20mg  is temporary to local pharmacy

## 2023-03-11 NOTE — Progress Notes (Deleted)
Office Visit Note  Patient: Norman Peters             Date of Birth: 06/27/1956           MRN: [id number]             PCP: Kerri Perches, MD Referring: Kerri Perches, MD Visit Date: 03/24/2023 Occupation: @GUAROCC @  Subjective:  No chief complaint on file.   History of Present Illness: [name] is a 67 y.o. male ***     Activities of Daily Living:  Patient reports morning stiffness for *** {minute/hour:19697}.   Patient {ACTIONS;DENIES/REPORTS:21021675::"Denies"} nocturnal pain.  Difficulty dressing/grooming: {ACTIONS;DENIES/REPORTS:21021675::"Denies"} Difficulty climbing stairs: {ACTIONS;DENIES/REPORTS:21021675::"Denies"} Difficulty getting out of chair: {ACTIONS;DENIES/REPORTS:21021675::"Denies"} Difficulty using hands for taps, buttons, cutlery, and/or writing: {ACTIONS;DENIES/REPORTS:21021675::"Denies"}  No Rheumatology ROS completed.   PMFS History:  Patient Active Problem List   Diagnosis Date Noted   Rheumatoid arthritis involving multiple sites with positive rheumatoid factor (HCC) 02/28/2023   High risk medication use 02/28/2023   Hyperuricemia 02/28/2023   Encounter for chronic pain management 07/12/2022   Back pain 06/21/2022   B12 deficiency 07/30/2021   Macrocytic anemia 07/22/2021   Chronic kidney disease 07/10/2021   Type 2 diabetes mellitus with diabetic neuropathy, unspecified (HCC) 07/06/2021   Seasonal allergies 09/12/2020   Osteoarthritis of right knee 09/02/2020   type 2 dia with ckd 02/05/2020   Annual visit for general adult medical examination with abnormal findings 01/11/2020   Vitamin D deficiency 01/11/2020   Obesity (BMI 30.0-34.9) 09/03/2019   Dental caries 09/03/2019   Chronic elbow pain, left 06/23/2019   Hyperlipidemia LDL goal <100 05/29/2017   Pain in joint involving multiple sites 02/12/2016   Essential hypertension, benign 02/10/2016   Anxiety and depression 02/10/2016    Past Medical History:  Diagnosis Date    Acute left-sided low back pain with left-sided sciatica 11/04/2020   Anxiety    Arthritis    Depression 2001   Diabetes mellitus 2010   GERD (gastroesophageal reflux disease)    Hyperlipidemia    Hypertension 2005    Family History  Problem Relation Age of Onset   Stroke Mother    Past Surgical History:  Procedure Laterality Date   COLONOSCOPY N/A 07/07/2016   Procedure: COLONOSCOPY;  Surgeon: Malissa Hippo, MD;  Location: [facility];  Service: Endoscopy;  Laterality: N/A;  1030   FINGER SURGERY Left 1974   fifth , trauma on job, had to replace/repair finger   HERNIA REPAIR  2006   x3, umbilical and bilateral hernia   Social History   Social History Narrative   Not on file   Immunization History  Administered Date(s) Administered   Fluad Quad(high Dose 65+) 09/02/2020, 07/17/2021, 09/15/2022   Influenza,inj,Quad PF,6+ Mos 07/14/2016, 08/25/2017, 08/30/2018, 07/02/2019   Moderna Covid-19 Vaccine Bivalent Booster 70yrs & up 10/01/2022   PFIZER(Purple Top)SARS-COV-2 Vaccination 01/25/2020, 02/19/2020, 09/28/2020, 05/29/2021, 09/25/2021   PNEUMOCOCCAL CONJUGATE-20 07/17/2021   Pfizer Covid-19 Vaccine Bivalent Booster 54yrs & up 09/25/2021   Pneumococcal Conjugate-13 10/08/2022   Pneumococcal Polysaccharide-23 02/10/2016   Rsv, Bivalent, Protein Subunit Rsvpref,pf Verdis Frederickson) 10/08/2022   Tdap 07/04/2021   Zoster Recombinat (Shingrix) 07/08/2021, 09/09/2021     Objective: Vital Signs: There were no vitals taken for this visit.   Physical Exam   Musculoskeletal Exam: ***  CDAI Exam: CDAI Score: -- Patient Global: --; Provider Global: -- Swollen: --; Tender: -- Joint Exam 03/24/2023   No joint exam has been documented for this visit   There  is currently no information documented on the homunculus. Go to the Rheumatology activity and complete the homunculus joint exam.  Investigation: No additional findings.  Imaging: No results found.  Recent Labs: Lab  Results  Component Value Date   WBC 10.0 02/23/2023   HGB 14.6 02/23/2023   PLT 288 02/23/2023   NA 142 02/23/2023   K 5.0 02/23/2023   CL 104 02/23/2023   CO2 22 02/23/2023   GLUCOSE 86 02/23/2023   BUN 14 02/23/2023   CREATININE 1.52 (H) 02/23/2023   BILITOT 0.4 02/23/2023   ALKPHOS 95 02/23/2023   AST 18 02/23/2023   ALT 24 02/23/2023   PROT 7.3 02/23/2023   ALBUMIN 4.3 02/23/2023   CALCIUM 9.6 02/23/2023   GFRAA 61 12/10/2020    Speciality Comments: No specialty comments available.  Procedures:  No procedures performed Allergies: Ibuprofen, Lisinopril, and Strawberry [berry]   Assessment / Plan:     Visit Diagnoses: No diagnosis found.  Orders: No orders of the defined types were placed in this encounter.  No orders of the defined types were placed in this encounter.   Face-to-face time spent with patient was *** minutes. Greater than 50% of time was spent in counseling and coordination of care.  Follow-Up Instructions: No follow-ups on file.   Ellen Henri, CMA  Note - This record has been created using Animal nutritionist.  Chart creation errors have been sought, but may not always  have been located. Such creation errors do not reflect on  the standard of medical care.

## 2023-03-12 ENCOUNTER — Other Ambulatory Visit: Payer: Self-pay | Admitting: Family Medicine

## 2023-03-21 ENCOUNTER — Ambulatory Visit: Payer: Medicare HMO | Admitting: Nurse Practitioner

## 2023-03-21 DIAGNOSIS — E1122 Type 2 diabetes mellitus with diabetic chronic kidney disease: Secondary | ICD-10-CM

## 2023-03-21 DIAGNOSIS — I10 Essential (primary) hypertension: Secondary | ICD-10-CM

## 2023-03-21 DIAGNOSIS — E782 Mixed hyperlipidemia: Secondary | ICD-10-CM

## 2023-03-24 ENCOUNTER — Ambulatory Visit: Payer: Medicare HMO | Admitting: Rheumatology

## 2023-03-24 DIAGNOSIS — M0579 Rheumatoid arthritis with rheumatoid factor of multiple sites without organ or systems involvement: Secondary | ICD-10-CM

## 2023-03-24 DIAGNOSIS — E559 Vitamin D deficiency, unspecified: Secondary | ICD-10-CM

## 2023-03-24 DIAGNOSIS — D539 Nutritional anemia, unspecified: Secondary | ICD-10-CM

## 2023-03-24 DIAGNOSIS — N1832 Chronic kidney disease, stage 3b: Secondary | ICD-10-CM

## 2023-03-24 DIAGNOSIS — J302 Other seasonal allergic rhinitis: Secondary | ICD-10-CM

## 2023-03-24 DIAGNOSIS — E785 Hyperlipidemia, unspecified: Secondary | ICD-10-CM

## 2023-03-24 DIAGNOSIS — M5136 Other intervertebral disc degeneration, lumbar region: Secondary | ICD-10-CM

## 2023-03-24 DIAGNOSIS — E790 Hyperuricemia without signs of inflammatory arthritis and tophaceous disease: Secondary | ICD-10-CM

## 2023-03-24 DIAGNOSIS — Z6829 Body mass index (BMI) 29.0-29.9, adult: Secondary | ICD-10-CM

## 2023-03-24 DIAGNOSIS — E1140 Type 2 diabetes mellitus with diabetic neuropathy, unspecified: Secondary | ICD-10-CM

## 2023-03-24 DIAGNOSIS — M170 Bilateral primary osteoarthritis of knee: Secondary | ICD-10-CM

## 2023-03-24 DIAGNOSIS — F32A Depression, unspecified: Secondary | ICD-10-CM

## 2023-03-24 DIAGNOSIS — Z79899 Other long term (current) drug therapy: Secondary | ICD-10-CM

## 2023-03-24 DIAGNOSIS — E538 Deficiency of other specified B group vitamins: Secondary | ICD-10-CM

## 2023-03-24 DIAGNOSIS — M19041 Primary osteoarthritis, right hand: Secondary | ICD-10-CM

## 2023-03-24 DIAGNOSIS — M19071 Primary osteoarthritis, right ankle and foot: Secondary | ICD-10-CM

## 2023-03-24 DIAGNOSIS — I10 Essential (primary) hypertension: Secondary | ICD-10-CM

## 2023-03-28 NOTE — Progress Notes (Deleted)
Office Visit Note  Patient: Norman Peters             Date of Birth: 08/22/1956           MRN: [id number]             PCP: Kerri Perches, MD Referring: Kerri Perches, MD Visit Date: 04/11/2023 Occupation: @GUAROCC @  Subjective:  Medication monitoring   History of Present Illness: Norman Peters is a 67 y.o. male with history of seropositive rheumatoid arthritis and osteoarthritis.  Patient is taking plaquenil 200 mg 1 tablet by mouth daily.  Plaquenil was added after his last office visit on 01/20/23.   No baseline plaquenil eye examination on file. CBC and CMP updated on 02/23/23.    Activities of Daily Living:  Patient reports morning stiffness for *** {minute/hour:19697}.   Patient {ACTIONS;DENIES/REPORTS:21021675::"Denies"} nocturnal pain.  Difficulty dressing/grooming: {ACTIONS;DENIES/REPORTS:21021675::"Denies"} Difficulty climbing stairs: {ACTIONS;DENIES/REPORTS:21021675::"Denies"} Difficulty getting out of chair: {ACTIONS;DENIES/REPORTS:21021675::"Denies"} Difficulty using hands for taps, buttons, cutlery, and/or writing: {ACTIONS;DENIES/REPORTS:21021675::"Denies"}  No Rheumatology ROS completed.   PMFS History:  Patient Active Problem List   Diagnosis Date Noted   Rheumatoid arthritis involving multiple sites with positive rheumatoid factor (HCC) 02/28/2023   High risk medication use 02/28/2023   Hyperuricemia 02/28/2023   Encounter for chronic pain management 07/12/2022   Back pain 06/21/2022   B12 deficiency 07/30/2021   Macrocytic anemia 07/22/2021   Chronic kidney disease 07/10/2021   Type 2 diabetes mellitus with diabetic neuropathy, unspecified (HCC) 07/06/2021   Seasonal allergies 09/12/2020   Osteoarthritis of right knee 09/02/2020   type 2 dia with ckd 02/05/2020   Annual visit for general adult medical examination with abnormal findings 01/11/2020   Vitamin D deficiency 01/11/2020   Obesity (BMI 30.0-34.9) 09/03/2019   Dental caries 09/03/2019    Chronic elbow pain, left 06/23/2019   Hyperlipidemia LDL goal <100 05/29/2017   Pain in joint involving multiple sites 02/12/2016   Essential hypertension, benign 02/10/2016   Anxiety and depression 02/10/2016    Past Medical History:  Diagnosis Date   Acute left-sided low back pain with left-sided sciatica 11/04/2020   Anxiety    Arthritis    Depression 2001   Diabetes mellitus 2010   GERD (gastroesophageal reflux disease)    Hyperlipidemia    Hypertension 2005    Family History  Problem Relation Age of Onset   Stroke Mother    Past Surgical History:  Procedure Laterality Date   COLONOSCOPY N/A 07/07/2016   Procedure: COLONOSCOPY;  Surgeon: Malissa Hippo, MD;  Location: [facility];  Service: Endoscopy;  Laterality: N/A;  1030   FINGER SURGERY Left 1974   fifth , trauma on job, had to replace/repair finger   HERNIA REPAIR  2006   x3, umbilical and bilateral hernia   Social History   Social History Narrative   Not on file   Immunization History  Administered Date(s) Administered   Fluad Quad(high Dose 65+) 09/02/2020, 07/17/2021, 09/15/2022   Influenza,inj,Quad PF,6+ Mos 07/14/2016, 08/25/2017, 08/30/2018, 07/02/2019   Moderna Covid-19 Vaccine Bivalent Booster 54yrs & up 10/01/2022   PFIZER(Purple Top)SARS-COV-2 Vaccination 01/25/2020, 02/19/2020, 09/28/2020, 05/29/2021, 09/25/2021   PNEUMOCOCCAL CONJUGATE-20 07/17/2021   Pfizer Covid-19 Vaccine Bivalent Booster 35yrs & up 09/25/2021   Pneumococcal Conjugate-13 10/08/2022   Pneumococcal Polysaccharide-23 02/10/2016   Rsv, Bivalent, Protein Subunit Rsvpref,pf Verdis Frederickson) 10/08/2022   Tdap 07/04/2021   Zoster Recombinat (Shingrix) 07/08/2021, 09/09/2021     Objective: Vital Signs: There were no vitals taken for this visit.  Physical Exam Vitals and nursing note reviewed.  Constitutional:      Appearance: He is well-developed.  HENT:     Head: Normocephalic and atraumatic.  Eyes:     Conjunctiva/sclera:  Conjunctivae normal.     Pupils: Pupils are equal, round, and reactive to light.  Cardiovascular:     Rate and Rhythm: Normal rate and regular rhythm.     Heart sounds: Normal heart sounds.  Pulmonary:     Effort: Pulmonary effort is normal.     Breath sounds: Normal breath sounds.  Abdominal:     General: Bowel sounds are normal.     Palpations: Abdomen is soft.  Musculoskeletal:     Cervical back: Normal range of motion and neck supple.  Skin:    General: Skin is warm and dry.     Capillary Refill: Capillary refill takes less than 2 seconds.  Neurological:     Mental Status: He is alert and oriented to person, place, and time.  Psychiatric:        Behavior: Behavior normal.      Musculoskeletal Exam: ***  CDAI Exam: CDAI Score: -- Patient Global: --; Provider Global: -- Swollen: --; Tender: -- Joint Exam 04/11/2023   No joint exam has been documented for this visit   There is currently no information documented on the homunculus. Go to the Rheumatology activity and complete the homunculus joint exam.  Investigation: No additional findings.  Imaging: No results found.  Recent Labs: Lab Results  Component Value Date   WBC 10.0 02/23/2023   HGB 14.6 02/23/2023   PLT 288 02/23/2023   NA 142 02/23/2023   K 5.0 02/23/2023   CL 104 02/23/2023   CO2 22 02/23/2023   GLUCOSE 86 02/23/2023   BUN 14 02/23/2023   CREATININE 1.52 (H) 02/23/2023   BILITOT 0.4 02/23/2023   ALKPHOS 95 02/23/2023   AST 18 02/23/2023   ALT 24 02/23/2023   PROT 7.3 02/23/2023   ALBUMIN 4.3 02/23/2023   CALCIUM 9.6 02/23/2023   GFRAA 61 12/10/2020    Speciality Comments: No specialty comments available.  Procedures:  No procedures performed Allergies: Ibuprofen, Lisinopril, and Strawberry [berry]   Assessment / Plan:     Visit Diagnoses: No diagnosis found.  Orders: No orders of the defined types were placed in this encounter.  No orders of the defined types were placed in  this encounter.   Face-to-face time spent with patient was *** minutes. Greater than 50% of time was spent in counseling and coordination of care.  Follow-Up Instructions: No follow-ups on file.   Ellen Henri, CMA  Note - This record has been created using Animal nutritionist.  Chart creation errors have been sought, but may not always  have been located. Such creation errors do not reflect on  the standard of medical care.

## 2023-04-11 ENCOUNTER — Ambulatory Visit: Payer: Medicare HMO | Admitting: Physician Assistant

## 2023-04-11 DIAGNOSIS — I10 Essential (primary) hypertension: Secondary | ICD-10-CM

## 2023-04-11 DIAGNOSIS — M19071 Primary osteoarthritis, right ankle and foot: Secondary | ICD-10-CM

## 2023-04-11 DIAGNOSIS — M19042 Primary osteoarthritis, left hand: Secondary | ICD-10-CM

## 2023-04-11 DIAGNOSIS — J302 Other seasonal allergic rhinitis: Secondary | ICD-10-CM

## 2023-04-11 DIAGNOSIS — E559 Vitamin D deficiency, unspecified: Secondary | ICD-10-CM

## 2023-04-11 DIAGNOSIS — D539 Nutritional anemia, unspecified: Secondary | ICD-10-CM

## 2023-04-11 DIAGNOSIS — E538 Deficiency of other specified B group vitamins: Secondary | ICD-10-CM

## 2023-04-11 DIAGNOSIS — Z79899 Other long term (current) drug therapy: Secondary | ICD-10-CM

## 2023-04-11 DIAGNOSIS — N1832 Chronic kidney disease, stage 3b: Secondary | ICD-10-CM

## 2023-04-11 DIAGNOSIS — M5136 Other intervertebral disc degeneration, lumbar region: Secondary | ICD-10-CM

## 2023-04-11 DIAGNOSIS — Z6829 Body mass index (BMI) 29.0-29.9, adult: Secondary | ICD-10-CM

## 2023-04-11 DIAGNOSIS — M170 Bilateral primary osteoarthritis of knee: Secondary | ICD-10-CM

## 2023-04-11 DIAGNOSIS — F32A Depression, unspecified: Secondary | ICD-10-CM

## 2023-04-11 DIAGNOSIS — M0579 Rheumatoid arthritis with rheumatoid factor of multiple sites without organ or systems involvement: Secondary | ICD-10-CM

## 2023-04-11 DIAGNOSIS — E785 Hyperlipidemia, unspecified: Secondary | ICD-10-CM

## 2023-04-11 DIAGNOSIS — E1140 Type 2 diabetes mellitus with diabetic neuropathy, unspecified: Secondary | ICD-10-CM

## 2023-04-11 DIAGNOSIS — E790 Hyperuricemia without signs of inflammatory arthritis and tophaceous disease: Secondary | ICD-10-CM

## 2023-04-11 NOTE — Progress Notes (Deleted)
Office Visit Note  Patient: Norman Peters             Date of Birth: December 16, 1955           MRN: [id number]             PCP: Kerri Perches, MD Referring: Kerri Perches, MD Visit Date: 04/25/2023 Occupation: @GUAROCC @  Subjective:  No chief complaint on file.   History of Present Illness: Norman Peters is a 67 y.o. male ***     Activities of Daily Living:  Patient reports morning stiffness for *** {minute/hour:19697}.   Patient {ACTIONS;DENIES/REPORTS:21021675::"Denies"} nocturnal pain.  Difficulty dressing/grooming: {ACTIONS;DENIES/REPORTS:21021675::"Denies"} Difficulty climbing stairs: {ACTIONS;DENIES/REPORTS:21021675::"Denies"} Difficulty getting out of chair: {ACTIONS;DENIES/REPORTS:21021675::"Denies"} Difficulty using hands for taps, buttons, cutlery, and/or writing: {ACTIONS;DENIES/REPORTS:21021675::"Denies"}  No Rheumatology ROS completed.   PMFS History:  Patient Active Problem List   Diagnosis Date Noted   Rheumatoid arthritis involving multiple sites with positive rheumatoid factor (HCC) 02/28/2023   High risk medication use 02/28/2023   Hyperuricemia 02/28/2023   Encounter for chronic pain management 07/12/2022   Back pain 06/21/2022   B12 deficiency 07/30/2021   Macrocytic anemia 07/22/2021   Chronic kidney disease 07/10/2021   Type 2 diabetes mellitus with diabetic neuropathy, unspecified (HCC) 07/06/2021   Seasonal allergies 09/12/2020   Osteoarthritis of right knee 09/02/2020   type 2 dia with ckd 02/05/2020   Annual visit for general adult medical examination with abnormal findings 01/11/2020   Vitamin D deficiency 01/11/2020   Obesity (BMI 30.0-34.9) 09/03/2019   Dental caries 09/03/2019   Chronic elbow pain, left 06/23/2019   Hyperlipidemia LDL goal <100 05/29/2017   Pain in joint involving multiple sites 02/12/2016   Essential hypertension, benign 02/10/2016   Anxiety and depression 02/10/2016    Past Medical History:  Diagnosis Date    Acute left-sided low back pain with left-sided sciatica 11/04/2020   Anxiety    Arthritis    Depression 2001   Diabetes mellitus 2010   GERD (gastroesophageal reflux disease)    Hyperlipidemia    Hypertension 2005    Family History  Problem Relation Age of Onset   Stroke Mother    Past Surgical History:  Procedure Laterality Date   COLONOSCOPY N/A 07/07/2016   Procedure: COLONOSCOPY;  Surgeon: Malissa Hippo, MD;  Location: [facility];  Service: Endoscopy;  Laterality: N/A;  1030   FINGER SURGERY Left 1974   fifth , trauma on job, had to replace/repair finger   HERNIA REPAIR  2006   x3, umbilical and bilateral hernia   Social History   Social History Narrative   Not on file   Immunization History  Administered Date(s) Administered   Fluad Quad(high Dose 65+) 09/02/2020, 07/17/2021, 09/15/2022   Influenza,inj,Quad PF,6+ Mos 07/14/2016, 08/25/2017, 08/30/2018, 07/02/2019   Moderna Covid-19 Vaccine Bivalent Booster 61yrs & up 10/01/2022   PFIZER(Purple Top)SARS-COV-2 Vaccination 01/25/2020, 02/19/2020, 09/28/2020, 05/29/2021, 09/25/2021   PNEUMOCOCCAL CONJUGATE-20 07/17/2021   Pfizer Covid-19 Vaccine Bivalent Booster 38yrs & up 09/25/2021   Pneumococcal Conjugate-13 10/08/2022   Pneumococcal Polysaccharide-23 02/10/2016   Rsv, Bivalent, Protein Subunit Rsvpref,pf Verdis Frederickson) 10/08/2022   Tdap 07/04/2021   Zoster Recombinat (Shingrix) 07/08/2021, 09/09/2021     Objective: Vital Signs: There were no vitals taken for this visit.   Physical Exam   Musculoskeletal Exam: ***  CDAI Exam: CDAI Score: -- Patient Global: --; Provider Global: -- Swollen: --; Tender: -- Joint Exam 04/25/2023   No joint exam has been documented for this visit   There  is currently no information documented on the homunculus. Go to the Rheumatology activity and complete the homunculus joint exam.  Investigation: No additional findings.  Imaging: No results found.  Recent Labs: Lab  Results  Component Value Date   WBC 10.0 02/23/2023   HGB 14.6 02/23/2023   PLT 288 02/23/2023   NA 142 02/23/2023   K 5.0 02/23/2023   CL 104 02/23/2023   CO2 22 02/23/2023   GLUCOSE 86 02/23/2023   BUN 14 02/23/2023   CREATININE 1.52 (H) 02/23/2023   BILITOT 0.4 02/23/2023   ALKPHOS 95 02/23/2023   AST 18 02/23/2023   ALT 24 02/23/2023   PROT 7.3 02/23/2023   ALBUMIN 4.3 02/23/2023   CALCIUM 9.6 02/23/2023   GFRAA 61 12/10/2020    Speciality Comments: No specialty comments available.  Procedures:  No procedures performed Allergies: Ibuprofen, Lisinopril, and Strawberry [berry]   Assessment / Plan:     Visit Diagnoses: No diagnosis found.  Orders: No orders of the defined types were placed in this encounter.  No orders of the defined types were placed in this encounter.   Face-to-face time spent with patient was *** minutes. Greater than 50% of time was spent in counseling and coordination of care.  Follow-Up Instructions: No follow-ups on file.   Ellen Henri, CMA  Note - This record has been created using Animal nutritionist.  Chart creation errors have been sought, but may not always  have been located. Such creation errors do not reflect on  the standard of medical care.

## 2023-04-19 ENCOUNTER — Other Ambulatory Visit (HOSPITAL_COMMUNITY): Payer: Self-pay

## 2023-04-19 DIAGNOSIS — I10 Essential (primary) hypertension: Secondary | ICD-10-CM | POA: Diagnosis not present

## 2023-04-19 DIAGNOSIS — E782 Mixed hyperlipidemia: Secondary | ICD-10-CM | POA: Diagnosis not present

## 2023-04-20 ENCOUNTER — Encounter: Payer: Self-pay | Admitting: Nurse Practitioner

## 2023-04-20 ENCOUNTER — Ambulatory Visit (INDEPENDENT_AMBULATORY_CARE_PROVIDER_SITE_OTHER): Payer: Medicare HMO | Admitting: Nurse Practitioner

## 2023-04-20 VITALS — BP 148/90 | HR 79 | Ht 68.0 in | Wt 206.4 lb

## 2023-04-20 DIAGNOSIS — E1122 Type 2 diabetes mellitus with diabetic chronic kidney disease: Secondary | ICD-10-CM

## 2023-04-20 DIAGNOSIS — I10 Essential (primary) hypertension: Secondary | ICD-10-CM

## 2023-04-20 DIAGNOSIS — E782 Mixed hyperlipidemia: Secondary | ICD-10-CM

## 2023-04-20 DIAGNOSIS — N1832 Chronic kidney disease, stage 3b: Secondary | ICD-10-CM

## 2023-04-20 DIAGNOSIS — Z794 Long term (current) use of insulin: Secondary | ICD-10-CM

## 2023-04-20 DIAGNOSIS — Z7985 Long-term (current) use of injectable non-insulin antidiabetic drugs: Secondary | ICD-10-CM

## 2023-04-20 LAB — CMP14+EGFR
ALT: 14 IU/L (ref 0–44)
AST: 10 IU/L (ref 0–40)
Albumin/Globulin Ratio: 1.4
Albumin: 4.2 g/dL (ref 3.9–4.9)
Alkaline Phosphatase: 93 IU/L (ref 44–121)
BUN/Creatinine Ratio: 12 (ref 10–24)
BUN: 17 mg/dL (ref 8–27)
Bilirubin Total: 0.5 mg/dL (ref 0.0–1.2)
CO2: 22 mmol/L (ref 20–29)
Calcium: 9.5 mg/dL (ref 8.6–10.2)
Chloride: 103 mmol/L (ref 96–106)
Creatinine, Ser: 1.44 mg/dL — ABNORMAL HIGH (ref 0.76–1.27)
Globulin, Total: 3.1 g/dL (ref 1.5–4.5)
Glucose: 131 mg/dL — ABNORMAL HIGH (ref 70–99)
Potassium: 4.4 mmol/L (ref 3.5–5.2)
Sodium: 138 mmol/L (ref 134–144)
Total Protein: 7.3 g/dL (ref 6.0–8.5)
eGFR: 53 mL/min/{1.73_m2} — ABNORMAL LOW (ref >59)

## 2023-04-20 LAB — LIPID PANEL
Chol/HDL Ratio: 2.8 ratio (ref 0.0–5.0)
Cholesterol, Total: 100 mg/dL (ref 100–199)
HDL: 36 mg/dL — ABNORMAL LOW (ref >39)
LDL Chol Calc (NIH): 36 mg/dL (ref 0–99)
Triglycerides: 167 mg/dL — ABNORMAL HIGH (ref 0–149)
VLDL Cholesterol Cal: 28 mg/dL (ref 5–40)

## 2023-04-20 LAB — POCT GLYCOSYLATED HEMOGLOBIN (HGB A1C): Hemoglobin A1C: 7 % — AB (ref 4.0–5.6)

## 2023-04-20 MED ORDER — SEMAGLUTIDE (1 MG/DOSE) 4 MG/3ML ~~LOC~~ SOPN: 1.0000 mg | PEN_INJECTOR | SUBCUTANEOUS | 3 refills | Status: DC

## 2023-04-20 MED ORDER — LANTUS SOLOSTAR 100 UNIT/ML ~~LOC~~ SOPN: 20.0000 [IU] | PEN_INJECTOR | Freq: Every day | SUBCUTANEOUS | 3 refills | Status: DC

## 2023-04-20 NOTE — Progress Notes (Signed)
Endocrinology follow-up note    04/20/2023, 9:29 AM   Subjective:    Patient ID: Norman Peters, male    DOB: [birth year].  Norman Peters is being seen in follow-up after he was seen in consultation for management of currently uncontrolled symptomatic diabetes requested by  Kerri Perches, MD.   Past Medical History:  Diagnosis Date   Acute left-sided low back pain with left-sided sciatica 11/04/2020   Anxiety    Arthritis    Depression 2001   Diabetes mellitus 2010   GERD (gastroesophageal reflux disease)    Hyperlipidemia    Hypertension 2005    Past Surgical History:  Procedure Laterality Date   COLONOSCOPY N/A 07/07/2016   Procedure: COLONOSCOPY;  Surgeon: Malissa Hippo, MD;  Location: [facility];  Service: Endoscopy;  Laterality: N/A;  1030   FINGER SURGERY Left 1974   fifth , trauma on job, had to replace/repair finger   HERNIA REPAIR  2006   x3, umbilical and bilateral hernia    Social History   Socioeconomic History   Marital status: Married    Spouse name: Not on file   Number of children: Not on file   Years of education: Not on file   Highest education level: 11th grade  Occupational History   Not on file  Tobacco Use   Smoking status: Former    Packs/day: 0.50    Years: 30.00    Additional pack years: 0.00    Total pack years: 15.00    Types: Cigarettes    Quit date: 01/20/2011    Years since quitting: 12.2   Smokeless tobacco: Never  Vaping Use   Vaping Use: Never used  Substance and Sexual Activity   Alcohol use: No   Drug use: No   Sexual activity: Not Currently  Other Topics Concern   Not on file  Social History Narrative   Not on file   Social Determinants of Health   Financial Resource Strain: Low Risk  (02/24/2023)   Overall Financial Resource Strain (CARDIA)    Difficulty of Paying Living Expenses: Not hard at all  Food Insecurity: No Food  Insecurity (02/24/2023)   Hunger Vital Sign    Worried About Running Out of Food in the Last Year: Never true    Ran Out of Food in the Last Year: Never true  Transportation Needs: No Transportation Needs (02/24/2023)   PRAPARE - Administrator, Civil Service (Medical): No    Lack of Transportation (Non-Medical): No  Physical Activity: Insufficiently Active (04/22/2022)   Exercise Vital Sign    Days of Exercise per Week: 2 days    Minutes of Exercise per Session: 20 min  Stress: No Stress Concern Present (02/24/2023)   [employer] of Occupational Health - Occupational Stress Questionnaire    Feeling of Stress : Only a little  Social Connections: Socially Integrated (02/24/2023)   Social Connection and Isolation Panel [NHANES]    Frequency of Communication with Friends and Family: Twice a week    Frequency of Social Gatherings with Friends and Family: Once a week    Attends Religious  Services: More than 4 times per year    Active Member of Clubs or Organizations: Yes    Attends Banker Meetings: More than 4 times per year    Marital Status: Married    Family History  Problem Relation Age of Onset   Stroke Mother     Outpatient Encounter Medications as of 04/20/2023  Medication Sig   acetaminophen (TYLENOL) 325 MG tablet Take 650 mg by mouth every 6 (six) hours as needed.   albuterol (VENTOLIN HFA) 108 (90 Base) MCG/ACT inhaler INHALE 2 PUFFS BY MOUTH ONCE DAILY AS NEEDED FOR WHEEZING FOR SHORTNESS OF BREATH   allopurinol (ZYLOPRIM) 300 MG tablet Take 1 tablet (300 mg total) by mouth daily.   amlodipine-olmesartan (AZOR) 10-20 MG tablet Take 1 tablet by mouth daily.   aspirin EC 81 MG tablet Take 81 mg by mouth every morning.   busPIRone (BUSPAR) 5 MG tablet Take 1 tablet (5 mg total) by mouth 2 (two) times daily.   fenofibrate (TRICOR) 48 MG tablet Take 1 tablet (48 mg total) by mouth daily.   FLUoxetine (PROZAC) 40 MG capsule TAKE 1 CAPSULE EVERY DAY    hydroxychloroquine (PLAQUENIL) 200 MG tablet Take 1 tablet (200 mg total) by mouth daily.   methocarbamol (ROBAXIN) 750 MG tablet Take 1 tablet (750 mg total) by mouth 4 (four) times daily.   montelukast (SINGULAIR) 10 MG tablet Take 1 tablet by mouth once daily   olmesartan (BENICAR) 20 MG tablet Take 1 tablet (20 mg total) by mouth daily.   rosuvastatin (CRESTOR) 5 MG tablet TAKE 1 TABLET EVERY MONDAY, WEDNESDAY AND FRIDAY (DISCONTINUE PRAVASTATIN)   Semaglutide, 1 MG/DOSE, 4 MG/3ML SOPN Inject 1 mg as directed once a week.   traMADol (ULTRAM) 50 MG tablet Take 1 tablet (50 mg total) by mouth every 12 (twelve) hours as needed.   TRUE METRIX BLOOD GLUCOSE TEST test strip TEST BLOOD SUGAR ONE TIME DAILY AS DIRECTED   [DISCONTINUED] insulin glargine (LANTUS SOLOSTAR) 100 UNIT/ML Solostar Pen Inject 30 Units into the skin at bedtime.   [DISCONTINUED] Semaglutide,0.25 or 0.5MG /DOS, (OZEMPIC, 0.25 OR 0.5 MG/DOSE,) 2 MG/3ML SOPN INJECT 0.5 MG SUBCUTANEOUSLY ONCE A WEEK   insulin glargine (LANTUS SOLOSTAR) 100 UNIT/ML Solostar Pen Inject 20 Units into the skin at bedtime.   No facility-administered encounter medications on file as of 04/20/2023.    ALLERGIES: Allergies  Allergen Reactions   Ibuprofen Rash   Lisinopril Cough   Strawberry [Berry] Hives and Swelling    TONGUE SWELLING    VACCINATION STATUS: Immunization History  Administered Date(s) Administered   Fluad Quad(high Dose 65+) 09/02/2020, 07/17/2021, 09/15/2022   Influenza,inj,Quad PF,6+ Mos 07/14/2016, 08/25/2017, 08/30/2018, 07/02/2019   Moderna Covid-19 Vaccine Bivalent Booster 73yrs & up 10/01/2022   PFIZER(Purple Top)SARS-COV-2 Vaccination 01/25/2020, 02/19/2020, 09/28/2020, 05/29/2021, 09/25/2021   PNEUMOCOCCAL CONJUGATE-20 07/17/2021   Pfizer Covid-19 Vaccine Bivalent Booster 24yrs & up 09/25/2021   Pneumococcal Conjugate-13 10/08/2022   Pneumococcal Polysaccharide-23 02/10/2016   Rsv, Bivalent, Protein Subunit Rsvpref,pf  Verdis Frederickson) 10/08/2022   Tdap 07/04/2021   Zoster Recombinat (Shingrix) 07/08/2021, 09/09/2021    Diabetes He presents for his follow-up diabetic visit. He has type 2 diabetes mellitus. Onset time: He was diagnosed at approximate age of 48 years. His disease course has been improving. Pertinent negatives for hypoglycemia include no confusion, headaches, pallor or seizures. Associated symptoms include fatigue. Pertinent negatives for diabetes include no blurred vision, no chest pain, no polydipsia, no polyphagia, no polyuria, no weakness and no weight  loss. Hypoglycemia complications include nocturnal hypoglycemia. Pertinent negatives for hypoglycemia complications include no required assistance (went to ED for hypoglycemia since last visit). Symptoms are improving. Diabetic complications include nephropathy and peripheral neuropathy. Risk factors for coronary artery disease include diabetes mellitus, dyslipidemia, obesity, hypertension, male sex, tobacco exposure and sedentary lifestyle. Current diabetic treatment includes insulin injections (and Ozempic). He is compliant with treatment most of the time. His weight is decreasing steadily. He is following a generally healthy diet. When asked about meal planning, he reported none. He has had a previous visit with a dietitian. He rarely participates in exercise. His home blood glucose trend is decreasing steadily. His overall blood glucose range is 140-180 mg/dl. (He presents today with his meter and CGM showing at goal glycemic profile overall.  His POCT A1c today is 7, increasing slightly from last visit of 6.8%.  Analysis of his CGM shows TIR 66%, TAR 34%, TBR 0%, with a GMI of 7.3%.  He denies any significant hypoglycemia.) An ACE inhibitor/angiotensin II receptor blocker is not being taken. He does not see a podiatrist.Eye exam is current.  Hyperlipidemia This is a chronic problem. The current episode started more than 1 year ago. The problem is  uncontrolled. Recent lipid tests were reviewed and are variable. Exacerbating diseases include chronic renal disease, diabetes and obesity. Factors aggravating his hyperlipidemia include fatty foods. Pertinent negatives include no chest pain, myalgias or shortness of breath. Current antihyperlipidemic treatment includes statins. The current treatment provides mild improvement of lipids. Compliance problems include adherence to exercise and adherence to diet.  Risk factors for coronary artery disease include diabetes mellitus, dyslipidemia, male sex, obesity, hypertension and a sedentary lifestyle.  Hypertension This is a chronic problem. The current episode started more than 1 year ago. The problem has been resolved since onset. The problem is controlled. Pertinent negatives include no blurred vision, chest pain, headaches, neck pain, palpitations or shortness of breath. There are no associated agents to hypertension. Risk factors for coronary artery disease include diabetes mellitus, male gender, smoking/tobacco exposure, sedentary lifestyle, obesity and dyslipidemia. Past treatments include calcium channel blockers and diuretics. The current treatment provides mild improvement. There are no compliance problems.  Hypertensive end-organ damage includes kidney disease. Identifiable causes of hypertension include chronic renal disease.    Review of systems  Constitutional: + decreasing body weight,  current Body mass index is 31.38 kg/m. , no fatigue, no subjective hyperthermia, no subjective hypothermia Eyes: no blurry vision, no xerophthalmia ENT: no sore throat, no nodules palpated in throat, no dysphagia/odynophagia, no hoarseness Cardiovascular: no chest pain, no shortness of breath, no palpitations, no leg swelling Respiratory: no cough, no shortness of breath Gastrointestinal: no nausea/vomiting/diarrhea Musculoskeletal: no muscle/joint aches Skin: no rashes, no hyperemia Neurological: no  tremors, no numbness, no tingling, no dizziness Psychiatric: no depression, no anxiety   Objective:    BP (!) 148/90 (BP Location: Right Arm, Patient Position: Sitting, Cuff Size: Large) Comment: Retake manuel cuff  Pulse 79   Ht 5\' 8"  (1.727 m)   Wt 206 lb 6.4 oz (93.6 kg)   BMI 31.38 kg/m   Wt Readings from Last 3 Encounters:  04/20/23 206 lb 6.4 oz (93.6 kg)  02/24/23 212 lb (96.2 kg)  01/20/23 207 lb (93.9 kg)    BP Readings from Last 3 Encounters:  04/20/23 (!) 148/90  02/24/23 (!) 150/90  01/20/23 122/78     Physical Exam- Limited  Constitutional:  Body mass index is 31.38 kg/m. , not in acute distress,  normal state of mind Eyes:  EOMI, no exophthalmos Musculoskeletal: no gross deformities, strength intact in all four extremities, no gross restriction of joint movements Skin:  no rashes, no hyperemia Neurological: no tremor with outstretched hands  Diabetic Foot Exam - Simple   Simple Foot Form Diabetic Foot exam was performed with the following findings: Yes 04/20/2023  9:22 AM  Visual Inspection See comments: Yes Sensation Testing Intact to touch and monofilament testing bilaterally: Yes See comments: Yes Pulse Check Posterior Tibialis and Dorsalis pulse intact bilaterally: Yes Comments Decreased sensation to monofilament tool on left foot- hammer toe deformity to toes 3-5 bilaterally     CMP     Component Value Date/Time   NA 138 04/19/2023 1055   K 4.4 04/19/2023 1055   CL 103 04/19/2023 1055   CO2 22 04/19/2023 1055   GLUCOSE 131 (H) 04/19/2023 1055   GLUCOSE 113 (H) 10/23/2022 0242   BUN 17 04/19/2023 1055   CREATININE 1.44 (H) 04/19/2023 1055   CREATININE 1.47 (H) 08/25/2020 0927   CALCIUM 9.5 04/19/2023 1055   PROT 7.3 04/19/2023 1055   ALBUMIN 4.2 04/19/2023 1055   AST 10 04/19/2023 1055   ALT 14 04/19/2023 1055   ALKPHOS 93 04/19/2023 1055   BILITOT 0.5 04/19/2023 1055   GFRNONAA 40 (L) 10/23/2022 0242   GFRNONAA 50 (L) 08/25/2020  0927   GFRAA 61 12/10/2020 0933   GFRAA 58 (L) 08/25/2020 0927     Diabetic Labs (most recent): Lab Results  Component Value Date   HGBA1C 7.0 (A) 04/20/2023   HGBA1C 6.8 (A) 11/19/2022   HGBA1C 6.8 (A) 07/20/2022   MICROALBUR 150 03/25/2022   MICROALBUR 150 03/16/2021   MICROALBUR 188.1 (H) 01/07/2020     Lipid Panel     Component Value Date/Time   CHOL 100 04/19/2023 1055   TRIG 167 (H) 04/19/2023 1055   HDL 36 (L) 04/19/2023 1055   CHOLHDL 2.8 04/19/2023 1055   CHOLHDL 3.4 08/25/2020 0927   VLDL 39 (H) 05/14/2017 0831   LDLCALC 36 04/19/2023 1055   LDLCALC 55 08/25/2020 0927   LABVLDL 28 04/19/2023 1055      Lab Results  Component Value Date   TSH 1.300 03/04/2022   TSH 2.215 07/22/2021   TSH 2.100 06/10/2021   TSH 2.79 08/25/2020   TSH 1.96 08/24/2019   TSH 1.34 01/31/2018   TSH 1.61 02/10/2016   FREET4 1.14 06/10/2021   FREET4 1.0 08/25/2020     Assessment & Plan:   1) Type 2 diabetes mellitus with stage 3a chronic kidney disease, with long-term current use of insulin (HCC)  - Norman Peters has currently uncontrolled symptomatic type 2 DM since 67 years of age.  He presents today with his meter and CGM showing at goal glycemic profile overall.  His POCT A1c today is 7, increasing slightly from last visit of 6.8%.  Analysis of his CGM shows TIR 66%, TAR 34%, TBR 0%, with a GMI of 7.3%.  He denies any significant hypoglycemia.   Recent labs reviewed.  - I had a long discussion with him about the progressive nature of diabetes and the pathology behind its complications. -his diabetes is complicated by CKD and he remains at a high risk for more acute and chronic complications which include CAD, CVA, CKD, retinopathy, and neuropathy. These are all discussed in detail with him.  - Nutritional counseling repeated at each appointment due to patients tendency to fall back in to old habits.  - The patient  admits there is a room for improvement in their diet and  drink choices. -  Suggestion is made for the patient to avoid simple carbohydrates from their diet including Cakes, Sweet Desserts / Pastries, Ice Cream, Soda (diet and regular), Sweet Tea, Candies, Chips, Cookies, Sweet Pastries, Store Bought Juices, Alcohol in Excess of 1-2 drinks a day, Artificial Sweeteners, Coffee Creamer, and "Sugar-free" Products. This will help patient to have stable blood glucose profile and potentially avoid unintended weight gain.   - I encouraged the patient to switch to unprocessed or minimally processed complex starch and increased protein intake (animal or plant source), fruits, and vegetables.   - Patient is advised to stick to a routine mealtimes to eat 3 meals a day and avoid unnecessary snacks (to snack only to correct hypoglycemia).  - I have approached him with the following individualized plan to manage his diabetes and patient agrees:   - he will continue to benefit from basal insulin.    -He is advised to increase his Ozempic to 1 mg SQ weekly and decrease his Lantus to 20 units SQ nightly.   -He is encouraged to continue monitoring blood glucose twice daily (using his CGM), before breakfast and before bed, and to call the clinic if he has readings less than 70 or greater than 200 for 3 tests in a row.   - he is warned not to take insulin without proper monitoring per orders.  -He reports that he had developed unexplained cough while taking Glipizide and Metformin.  After he finished his Glipizide and Metformin, reportedly the cough has improved.  He is advised to stay off of Glipizide and Metformin for now.   - Specific targets for  A1c;  LDL, HDL,  and Triglycerides were discussed with the patient.  2) Blood Pressure /Hypertension:  His blood pressure is slightly above target today.  he is advised to continue his current medications including Amlodipine/Olmesartan 10-20 mg p.o. daily.  3) Lipids/Hyperlipidemia:   His most recent lipid panel from  04/19/23 shows controlled LDL of 36 and elevated triglycerides of 167.  He is advised to continue Crestor 5 mg po every other day at bedtime and Fenofibrate 48 mg po daily.  Side effects and precautions discussed with him.   4)  Weight/Diet:  His Body mass index is 31.38 kg/m.  -   clearly complicating his diabetes care.   he is a candidate for weight loss. I discussed with him the fact that loss of 5 - 10% of his  current body weight will have the most impact on his diabetes management.  Exercise, and detailed carbohydrates information provided  -  detailed on discharge instructions.  5) Chronic Care/Health Maintenance: -he is on a Statin medications and is encouraged to initiate and continue to follow up with Ophthalmology, Dentist, Podiatrist at least yearly or according to recommendations, and advised to stay away from smoking. I have recommended yearly flu vaccine and pneumonia vaccine at least every 5 years; moderate intensity exercise for up to 150 minutes weekly; and  sleep for at least 7 hours a day.  - he is advised to maintain close follow up with Kerri Perches, MD for primary care needs, as well as his other providers for optimal and coordinated care.     I spent  28  minutes in the care of the patient today including review of labs from CMP, Lipids, Thyroid Function, Hematology (current and previous including abstractions from other facilities); face-to-face time discussing  his blood glucose readings/logs, discussing hypoglycemia and hyperglycemia episodes and symptoms, medications doses, his options of short and long term treatment based on the latest standards of care / guidelines;  discussion about incorporating lifestyle medicine;  and documenting the encounter. Risk reduction counseling performed per USPSTF guidelines to reduce obesity and cardiovascular risk factors.     Please refer to Patient Instructions for Blood Glucose Monitoring and Insulin/Medications Dosing Guide"   in media tab for additional information. Please  also refer to " Patient Self Inventory" in the Media  tab for reviewed elements of pertinent patient history.  [name] participated in the discussions, expressed understanding, and voiced agreement with the above plans.  All questions were answered to his satisfaction. he is encouraged to contact clinic should he have any questions or concerns prior to his return visit.   Follow up plan: - Return in about 3 months (around 07/21/2023) for Diabetes F/U with A1c in office, No previsit labs, Bring meter and logs.  Ronny Bacon, [facility] [address] [place], [place] [address] Phone: [phone] Fax: [phone]  04/20/2023, 9:29 AM

## 2023-04-21 DIAGNOSIS — Z79899 Other long term (current) drug therapy: Secondary | ICD-10-CM | POA: Diagnosis not present

## 2023-04-21 LAB — HM DIABETES EYE EXAM

## 2023-04-22 ENCOUNTER — Encounter: Payer: Self-pay | Admitting: Family Medicine

## 2023-04-22 ENCOUNTER — Other Ambulatory Visit (HOSPITAL_COMMUNITY): Payer: Self-pay

## 2023-04-22 ENCOUNTER — Ambulatory Visit (INDEPENDENT_AMBULATORY_CARE_PROVIDER_SITE_OTHER): Payer: Medicare HMO | Admitting: Family Medicine

## 2023-04-22 VITALS — BP 136/80 | HR 91 | Ht 68.0 in | Wt 209.1 lb

## 2023-04-22 DIAGNOSIS — E785 Hyperlipidemia, unspecified: Secondary | ICD-10-CM

## 2023-04-22 DIAGNOSIS — M0579 Rheumatoid arthritis with rheumatoid factor of multiple sites without organ or systems involvement: Secondary | ICD-10-CM | POA: Diagnosis not present

## 2023-04-22 DIAGNOSIS — E1140 Type 2 diabetes mellitus with diabetic neuropathy, unspecified: Secondary | ICD-10-CM | POA: Diagnosis not present

## 2023-04-22 DIAGNOSIS — E66811 Obesity, class 1: Secondary | ICD-10-CM

## 2023-04-22 DIAGNOSIS — E669 Obesity, unspecified: Secondary | ICD-10-CM

## 2023-04-22 DIAGNOSIS — I10 Essential (primary) hypertension: Secondary | ICD-10-CM | POA: Diagnosis not present

## 2023-04-22 MED ORDER — CHLORTHALIDONE 25 MG PO TABS: 25.0000 mg | ORAL_TABLET | Freq: Every day | ORAL | 2 refills | Status: DC

## 2023-04-22 MED ORDER — AMLODIPINE-OLMESARTAN 10-40 MG PO TABS: 1.0000 | ORAL_TABLET | Freq: Every day | ORAL | 3 refills | Status: DC

## 2023-04-22 MED ORDER — BUSPIRONE HCL 5 MG PO TABS
5.0000 mg | ORAL_TABLET | Freq: Two times a day (BID) | ORAL | 5 refills | Status: DC
  Filled 2023-04-22 – 2023-05-30 (×2): qty 60, 30d supply, fill #0
  Filled 2023-07-02: qty 60, 30d supply, fill #1

## 2023-04-22 NOTE — Patient Instructions (Addendum)
F/U in  10 weeks , re evaluate  blood pressure, call if you need me sooner  Blood pressure is still too high  Non fasting chem 7 and EGFR 3 days before follow up  New additional medication for blood pressure is Chlorthalidone 25 mg one daily, this is at [facility], start today  New higler dose of azor is 10/40 ONE daily, this will be shipped from [facility]  Until you get the10/40 azor dose  PLEASE continue Azor 10/20 one daily and olmesartan 20 mg one daily which you are now taking  Once you get Azor 10/40 , please STOP azor 10/20 and olmwsRTAN 20 MG  Urine Acr TODAY  PLEASE REDUCE CHEESE , AND OILY FOODS  CONGRATS ON IMPROVING Health habits and overall health , keep it up!

## 2023-04-24 LAB — MICROALBUMIN / CREATININE URINE RATIO
Creatinine, Urine: 177.4 mg/dL
Microalb/Creat Ratio: 303 mg/g creat — ABNORMAL HIGH (ref 0–29)
Microalbumin, Urine: 537.3 ug/mL

## 2023-04-25 ENCOUNTER — Encounter: Payer: Self-pay | Admitting: Physician Assistant

## 2023-04-25 ENCOUNTER — Ambulatory Visit: Payer: Medicare HMO | Attending: Rheumatology | Admitting: Physician Assistant

## 2023-04-25 ENCOUNTER — Encounter: Payer: Self-pay | Admitting: Family Medicine

## 2023-04-25 VITALS — BP 138/81 | HR 89 | Resp 17 | Ht 68.0 in | Wt 207.6 lb

## 2023-04-25 DIAGNOSIS — N1832 Chronic kidney disease, stage 3b: Secondary | ICD-10-CM

## 2023-04-25 DIAGNOSIS — E1140 Type 2 diabetes mellitus with diabetic neuropathy, unspecified: Secondary | ICD-10-CM | POA: Diagnosis not present

## 2023-04-25 DIAGNOSIS — F32A Depression, unspecified: Secondary | ICD-10-CM

## 2023-04-25 DIAGNOSIS — F419 Anxiety disorder, unspecified: Secondary | ICD-10-CM

## 2023-04-25 DIAGNOSIS — M19041 Primary osteoarthritis, right hand: Secondary | ICD-10-CM

## 2023-04-25 DIAGNOSIS — Z79899 Other long term (current) drug therapy: Secondary | ICD-10-CM

## 2023-04-25 DIAGNOSIS — J302 Other seasonal allergic rhinitis: Secondary | ICD-10-CM

## 2023-04-25 DIAGNOSIS — I10 Essential (primary) hypertension: Secondary | ICD-10-CM | POA: Diagnosis not present

## 2023-04-25 DIAGNOSIS — E559 Vitamin D deficiency, unspecified: Secondary | ICD-10-CM

## 2023-04-25 DIAGNOSIS — E785 Hyperlipidemia, unspecified: Secondary | ICD-10-CM

## 2023-04-25 DIAGNOSIS — M19071 Primary osteoarthritis, right ankle and foot: Secondary | ICD-10-CM

## 2023-04-25 DIAGNOSIS — M170 Bilateral primary osteoarthritis of knee: Secondary | ICD-10-CM

## 2023-04-25 DIAGNOSIS — M5136 Other intervertebral disc degeneration, lumbar region: Secondary | ICD-10-CM | POA: Diagnosis not present

## 2023-04-25 DIAGNOSIS — E790 Hyperuricemia without signs of inflammatory arthritis and tophaceous disease: Secondary | ICD-10-CM | POA: Diagnosis not present

## 2023-04-25 DIAGNOSIS — E538 Deficiency of other specified B group vitamins: Secondary | ICD-10-CM

## 2023-04-25 DIAGNOSIS — M0579 Rheumatoid arthritis with rheumatoid factor of multiple sites without organ or systems involvement: Secondary | ICD-10-CM

## 2023-04-25 DIAGNOSIS — Z6829 Body mass index (BMI) 29.0-29.9, adult: Secondary | ICD-10-CM

## 2023-04-25 DIAGNOSIS — M19072 Primary osteoarthritis, left ankle and foot: Secondary | ICD-10-CM

## 2023-04-25 DIAGNOSIS — M19042 Primary osteoarthritis, left hand: Secondary | ICD-10-CM

## 2023-04-25 DIAGNOSIS — D539 Nutritional anemia, unspecified: Secondary | ICD-10-CM

## 2023-04-25 MED ORDER — HYDROXYCHLOROQUINE SULFATE 200 MG PO TABS: 200.0000 mg | ORAL_TABLET | Freq: Every day | ORAL | 0 refills | Status: DC

## 2023-04-25 NOTE — Assessment & Plan Note (Signed)
Hyperlipidemia:Low fat diet discussed and encouraged.   Lipid Panel  Lab Results  Component Value Date   CHOL 100 04/19/2023   HDL 36 (L) 04/19/2023   LDLCALC 36 04/19/2023   TRIG 167 (H) 04/19/2023   CHOLHDL 2.8 04/19/2023     Needs to reduce fat in diet, no med change

## 2023-04-25 NOTE — Assessment & Plan Note (Signed)
Uncontroolled , add chlorthalidoine 25 mg daily, change to azor 10/40 DASH diet and commitment to daily physical activity for a minimum of 30 minutes discussed and encouraged, as a part of hypertension management. The importance of attaining a healthy weight is also discussed.     04/22/2023    1:25 PM 04/22/2023    1:23 PM 04/20/2023    9:06 AM 04/20/2023    8:59 AM 02/24/2023   11:58 AM 02/24/2023   11:27 AM 01/20/2023   10:55 AM  BP/Weight  Systolic BP 136 153 148 159 150 143 122  Diastolic BP 80 82 90 92 90 82 78  Wt. (Lbs)  209.12  206.4  212 207  BMI  31.8 kg/m2  31.38 kg/m2  32.23 kg/m2 31.47 kg/m2     Re eval in 10 weeks

## 2023-04-25 NOTE — Assessment & Plan Note (Signed)
Norman Peters is reminded of the importance of commitment to daily physical activity for 30 minutes or more, as able and the need to limit carbohydrate intake to 30 to 60 grams per meal to help with blood sugar control.   The need to take medication as prescribed, test blood sugar as directed, and to call between visits if there is a concern that blood sugar is uncontrolled is also discussed.   Norman Peters is reminded of the importance of daily foot exam, annual eye examination, and good blood sugar, blood pressure and cholesterol control. Marked improvement , managed by Endo     Latest Ref Rng & Units 04/22/2023    2:10 PM 04/20/2023    9:18 AM 04/19/2023   10:55 AM 02/23/2023    9:16 AM 11/19/2022    9:23 AM  Diabetic Labs  HbA1c 4.0 - 5.6 %  7.0    6.8   Micro/Creat Ratio 0 - 29 mg/g creat 303       Chol 100 - 199 mg/dL   409     HDL >81 mg/dL   36     Calc LDL 0 - 99 mg/dL   36     Triglycerides 0 - 149 mg/dL   191     Creatinine 4.78 - 1.27 mg/dL   2.95  6.21        01/13/6577    1:25 PM 04/22/2023    1:23 PM 04/20/2023    9:06 AM 04/20/2023    8:59 AM 02/24/2023   11:58 AM 02/24/2023   11:27 AM 01/20/2023   10:55 AM  BP/Weight  Systolic BP 136 153 148 159 150 143 122  Diastolic BP 80 82 90 92 90 82 78  Wt. (Lbs)  209.12  206.4  212 207  BMI  31.8 kg/m2  31.38 kg/m2  32.23 kg/m2 31.47 kg/m2      04/20/2023    9:30 AM 01/15/2022    1:00 PM  Foot/eye exam completion dates  Foot Form Completion Done Done

## 2023-04-25 NOTE — Assessment & Plan Note (Signed)
Improvement in pain since starting definitive therapy

## 2023-04-25 NOTE — Patient Instructions (Signed)
Standing Labs We placed an order today for your standing lab work.   Please have your standing labs drawn in 3 months   Please have your labs drawn 2 weeks prior to your appointment so that the provider can discuss your lab results at your appointment, if possible.  Please note that you may see your imaging and lab results in MyChart before we have reviewed them. We will contact you once all results are reviewed. Please allow our office up to 72 hours to thoroughly review all of the results before contacting the office for clarification of your results.  WALK-IN LAB HOURS  Monday through Thursday from 8:00 am -12:30 pm and 1:00 pm-5:00 pm and Friday from 8:00 am-12:00 pm.  Patients with office visits requiring labs will be seen before walk-in labs.  You may encounter longer than normal wait times. Please allow additional time. Wait times may be shorter on  Monday and Thursday afternoons.  We do not book appointments for walk-in labs. We appreciate your patience and understanding with our staff.   Labs are drawn by [facility]. Please bring your co-pay at the time of your lab draw.  You may receive a bill from [facility] for your lab work.  Please note if you are on Hydroxychloroquine and and an order has been placed for a Hydroxychloroquine level,  you will need to have it drawn 4 hours or more after your last dose.  If you wish to have your labs drawn at another location, please call the office 24 hours in advance so we can fax the orders.  The office is located at [address], [address], [place], [place] [address]   If you have any questions regarding directions or hours of operation,  please call [phone].   As a reminder, please drink plenty of water prior to coming for your lab work. Thanks!  

## 2023-04-25 NOTE — Assessment & Plan Note (Signed)
  Patient re-educated about  the importance of commitment to a  minimum of 150 minutes of exercise per week as able.  The importance of healthy food choices with portion control discussed, as well as eating regularly and within a 12 hour window most days. The need to choose "clean , [place]" food 50 to 75% of the time is discussed, as well as to make water the primary drink and set a goal of 64 ounces water daily.       04/22/2023    1:23 PM 04/20/2023    8:59 AM 02/24/2023   11:27 AM  Weight /BMI  Weight 209 lb 1.9 oz 206 lb 6.4 oz 212 lb  Height 5\' 8"  (1.727 m) 5\' 8"  (1.727 m) 5\' 8"  (1.727 m)  BMI 31.8 kg/m2 31.38 kg/m2 32.23 kg/m2

## 2023-04-25 NOTE — Progress Notes (Signed)
Norman Peters     MRN: [id number]      DOB: [birth year]  Chief Complaint  Patient presents with   Follow-up    Follow up    HPI Norman Peters is here for follow up and re-evaluation of chronic medical conditions, medication management and review of any available recent lab and radiology data.  Preventive health is updated, specifically  Cancer screening and Immunization.   Questions or concerns regarding consultations or procedures which the PT has had in the interim are  addressed. The PT denies any adverse reactions to current medications since the last visit.  Denies polyuria, polydipsia, blurred vision , or hypoglycemic episodes. Bllood sugar is markedly improved ROS Denies recent fever or chills. Denies sinus pressure, nasal congestion, ear pain or sore throat. Denies chest congestion, productive cough or wheezing. Denies chest pains, palpitations and leg swelling Denies abdominal pain, nausea, vomiting,diarrhea or constipation.   Denies dysuria, frequency, hesitancy or incontinence. Denies uncontrolled  joint pain, swelling and limitation in mobility. Denies headaches, seizures, numbness, or tingling. Denies depression, anxiety or insomnia. Denies skin break down or rash.   PE  BP 136/80 (BP Location: Left Arm, Patient Position: Sitting, Cuff Size: Large)   Pulse 91   Ht 5\' 8"  (1.727 m)   Wt 209 lb 1.9 oz (94.9 kg)   SpO2 93%   BMI 31.80 kg/m   Patient alert and oriented and in no cardiopulmonary distress.  HEENT: No facial asymmetry, EOMI,     Neck supple .  Chest: Clear to auscultation bilaterally.  CVS: S1, S2 no murmurs, no S3.Regular rate.  ABD: Soft non tender.   Ext: No edema  MS: Adequate ROM spine, shoulders, hips and knees.  Skin: Intact, no ulcerations or rash noted.  Psych: Good Peters contact, normal affect. Memory intact not anxious or depressed appearing.  CNS: CN 2-12 intact, power,  normal throughout.no focal deficits noted.   Assessment &  Plan  Essential hypertension, benign Uncontroolled , add chlorthalidoine 25 mg daily, change to azor 10/40 DASH diet and commitment to daily physical activity for a minimum of 30 minutes discussed and encouraged, as a part of hypertension management. The importance of attaining a healthy weight is also discussed.     04/22/2023    1:25 PM 04/22/2023    1:23 PM 04/20/2023    9:06 AM 04/20/2023    8:59 AM 02/24/2023   11:58 AM 02/24/2023   11:27 AM 01/20/2023   10:55 AM  BP/Weight  Systolic BP 136 153 148 159 150 143 122  Diastolic BP 80 82 90 92 90 82 78  Wt. (Lbs)  209.12  206.4  212 207  BMI  31.8 kg/m2  31.38 kg/m2  32.23 kg/m2 31.47 kg/m2     Re eval in 10 weeks  Hyperlipidemia LDL goal <100 Hyperlipidemia:Low fat diet discussed and encouraged.   Lipid Panel  Lab Results  Component Value Date   CHOL 100 04/19/2023   HDL 36 (L) 04/19/2023   LDLCALC 36 04/19/2023   TRIG 167 (H) 04/19/2023   CHOLHDL 2.8 04/19/2023     Needs to reduce fat in diet, no med change  Rheumatoid arthritis involving multiple sites with positive rheumatoid factor (HCC) Improvement in pain since starting definitive therapy  Type 2 diabetes mellitus with diabetic neuropathy, unspecified (HCC) Norman Peters is reminded of the importance of commitment to daily physical activity for 30 minutes or more, as able and the need to limit carbohydrate intake to 30 to 60  grams per meal to help with blood sugar control.   The need to take medication as prescribed, test blood sugar as directed, and to call between visits if there is a concern that blood sugar is uncontrolled is also discussed.   Norman Peters is reminded of the importance of daily foot exam, annual Peters examination, and good blood sugar, blood pressure and cholesterol control. Marked improvement , managed by Endo     Latest Ref Rng & Units 04/22/2023    2:10 PM 04/20/2023    9:18 AM 04/19/2023   10:55 AM 02/23/2023    9:16 AM 11/19/2022    9:23 AM   Diabetic Labs  HbA1c 4.0 - 5.6 %  7.0    6.8   Micro/Creat Ratio 0 - 29 mg/g creat 303       Chol 100 - 199 mg/dL   962     HDL >95 mg/dL   36     Calc LDL 0 - 99 mg/dL   36     Triglycerides 0 - 149 mg/dL   284     Creatinine 1.32 - 1.27 mg/dL   4.40  1.02        06/02/3663    1:25 PM 04/22/2023    1:23 PM 04/20/2023    9:06 AM 04/20/2023    8:59 AM 02/24/2023   11:58 AM 02/24/2023   11:27 AM 01/20/2023   10:55 AM  BP/Weight  Systolic BP 136 153 148 159 150 143 122  Diastolic BP 80 82 90 92 90 82 78  Wt. (Lbs)  209.12  206.4  212 207  BMI  31.8 kg/m2  31.38 kg/m2  32.23 kg/m2 31.47 kg/m2      04/20/2023    9:30 AM 01/15/2022    1:00 PM  Foot/Peters exam completion dates  Foot Form Completion Done Done        Obesity (BMI 30.0-34.9)  Patient re-educated about  the importance of commitment to a  minimum of 150 minutes of exercise per week as able.  The importance of healthy food choices with portion control discussed, as well as eating regularly and within a 12 hour window most days. The need to choose "clean , [place]" food 50 to 75% of the time is discussed, as well as to make water the primary drink and set a goal of 64 ounces water daily.       04/22/2023    1:23 PM 04/20/2023    8:59 AM 02/24/2023   11:27 AM  Weight /BMI  Weight 209 lb 1.9 oz 206 lb 6.4 oz 212 lb  Height 5\' 8"  (1.727 m) 5\' 8"  (1.727 m) 5\' 8"  (1.727 m)  BMI 31.8 kg/m2 31.38 kg/m2 32.23 kg/m2

## 2023-04-25 NOTE — Progress Notes (Signed)
Office Visit Note  Patient: Norman Peters             Date of Birth: [birth year]           MRN: [id number]             PCP: Kerri Perches, MD Referring: Kerri Perches, MD Visit Date: 04/25/2023 Occupation: @GUAROCC @  Subjective:  Medication monitoring   History of Present Illness: Norman Peters is a 67 y.o. male with history of seropositive rheumatoid arthritis and osteoarthritis.  He is taking plaquenil 200 mg 1 tablet by mouth daily. Plaquenil was started after his last office visit on 01/20/23.  He has been tolerating Plaquenil without any side effects.  He has not yet noticed any clinical benefit.  He continues to have chronic pain and stiffness in multiple joints.  His arthralgias have been migratory.  He states that his arthralgias are worse on cloudy days.  He has noticed intermittent inflammation in his left ankle along the joint line.  He previously worked several physically demanding jobs which he feels contributes to some of his arthralgias.  He has not yet noticed any clinical benefit since initiating Plaquenil but is apprehensive to try any other medications at this time.  He is been taking Tylenol 650 mg daily for pain relief, which is helpful.    Activities of Daily Living:  Patient reports morning stiffness for 5-10 minutes.   Patient Reports nocturnal pain.  Difficulty dressing/grooming: Reports Difficulty climbing stairs: Reports Difficulty getting out of chair: Reports Difficulty using hands for taps, buttons, cutlery, and/or writing: Denies  Review of Systems  Constitutional:  Negative for fatigue.  HENT:  Positive for mouth dryness. Negative for mouth sores.   Eyes:  Negative for dryness.  Respiratory:  Positive for shortness of breath.   Cardiovascular:  Negative for chest pain and palpitations.  Gastrointestinal:  Negative for blood in stool, constipation and diarrhea.  Endocrine: Negative for increased urination.  Genitourinary:  Negative for  involuntary urination.  Musculoskeletal:  Positive for joint pain, joint pain, joint swelling, myalgias, morning stiffness, muscle tenderness and myalgias. Negative for gait problem and muscle weakness.  Skin:  Positive for sensitivity to sunlight. Negative for color change and rash.  Allergic/Immunologic: Negative for susceptible to infections.  Neurological:  Negative for dizziness and headaches.  Hematological:  Negative for swollen glands.  Psychiatric/Behavioral:  Positive for sleep disturbance. Negative for depressed mood. The patient is not nervous/anxious.     PMFS History:  Patient Active Problem List   Diagnosis Date Noted   Rheumatoid arthritis involving multiple sites with positive rheumatoid factor (HCC) 02/28/2023   High risk medication use 02/28/2023   Hyperuricemia 02/28/2023   Encounter for chronic pain management 07/12/2022   Back pain 06/21/2022   B12 deficiency 07/30/2021   Macrocytic anemia 07/22/2021   Chronic kidney disease 07/10/2021   Type 2 diabetes mellitus with diabetic neuropathy, unspecified (HCC) 07/06/2021   Seasonal allergies 09/12/2020   Osteoarthritis of right knee 09/02/2020   type 2 dia with ckd 02/05/2020   Annual visit for general adult medical examination with abnormal findings 01/11/2020   Vitamin D deficiency 01/11/2020   Obesity (BMI 30.0-34.9) 09/03/2019   Dental caries 09/03/2019   Chronic elbow pain, left 06/23/2019   Hyperlipidemia LDL goal <100 05/29/2017   Pain in joint involving multiple sites 02/12/2016   Essential hypertension, benign 02/10/2016   Anxiety and depression 02/10/2016    Past Medical History:  Diagnosis Date   Acute left-sided  low back pain with left-sided sciatica 11/04/2020   Anxiety    Arthritis    Depression 2001   Diabetes mellitus 2010   GERD (gastroesophageal reflux disease)    Hyperlipidemia    Hypertension 2005    Family History  Problem Relation Age of Onset   Stroke Mother    Past Surgical  History:  Procedure Laterality Date   COLONOSCOPY N/A 07/07/2016   Procedure: COLONOSCOPY;  Surgeon: Malissa Hippo, MD;  Location: [facility];  Service: Endoscopy;  Laterality: N/A;  1030   FINGER SURGERY Left 1974   fifth , trauma on job, had to replace/repair finger   HERNIA REPAIR  2006   x3, umbilical and bilateral hernia   Social History   Social History Narrative   Not on file   Immunization History  Administered Date(s) Administered   Fluad Quad(high Dose 65+) 09/02/2020, 07/17/2021, 09/15/2022   Influenza,inj,Quad PF,6+ Mos 07/14/2016, 08/25/2017, 08/30/2018, 07/02/2019   Moderna Covid-19 Vaccine Bivalent Booster 63yrs & up 10/01/2022   PFIZER(Purple Top)SARS-COV-2 Vaccination 01/25/2020, 02/19/2020, 09/28/2020, 05/29/2021, 09/25/2021   PNEUMOCOCCAL CONJUGATE-20 07/17/2021   Pfizer Covid-19 Vaccine Bivalent Booster 60yrs & up 09/25/2021   Pneumococcal Conjugate-13 10/08/2022   Pneumococcal Polysaccharide-23 02/10/2016   Rsv, Bivalent, Protein Subunit Rsvpref,pf Verdis Frederickson) 10/08/2022   Tdap 07/04/2021   Zoster Recombinat (Shingrix) 07/08/2021, 09/09/2021     Objective: Vital Signs: BP 138/81 (BP Location: Right Arm, Patient Position: Sitting, Cuff Size: Large)   Pulse 89   Resp 17   Ht 5\' 8"  (1.727 m)   Wt 207 lb 9.6 oz (94.2 kg)   BMI 31.57 kg/m    Physical Exam Vitals and nursing note reviewed.  Constitutional:      Appearance: He is well-developed.  HENT:     Head: Normocephalic and atraumatic.  Eyes:     Conjunctiva/sclera: Conjunctivae normal.     Pupils: Pupils are equal, round, and reactive to light.  Cardiovascular:     Rate and Rhythm: Normal rate and regular rhythm.     Heart sounds: Normal heart sounds.  Pulmonary:     Effort: Pulmonary effort is normal.     Breath sounds: Normal breath sounds.  Abdominal:     General: Bowel sounds are normal.     Palpations: Abdomen is soft.  Musculoskeletal:     Cervical back: Normal range of motion and  neck supple.  Skin:    General: Skin is warm and dry.     Capillary Refill: Capillary refill takes less than 2 seconds.  Neurological:     Mental Status: He is alert and oriented to person, place, and time.  Psychiatric:        Behavior: Behavior normal.      Musculoskeletal Exam: C-spine has good range of motion.  Shoulder joints, elbow joints, wrist joints, MCPs, PIPs, DIPs have good range of motion with no synovitis.  DIP thickening noted bilaterally.  Tenderness over bilateral first MCP joints.  Discomfort with range of motion of both hip joints.  Painful range of motion of both knee joints.  No warmth or effusion noted.  Tenderness along the left ankle joint line.  Right ankle has good range of motion with no warmth or effusion.  CDAI Exam: CDAI Score: -- Patient Global: 60 / 100; Provider Global: 40 / 100 Swollen: --; Tender: -- Joint Exam 04/25/2023   No joint exam has been documented for this visit   There is currently no information documented on the homunculus. Go to the  Rheumatology activity and complete the homunculus joint exam.  Investigation: No additional findings.  Imaging: No results found.  Recent Labs: Lab Results  Component Value Date   WBC 10.0 02/23/2023   HGB 14.6 02/23/2023   PLT 288 02/23/2023   NA 138 04/19/2023   K 4.4 04/19/2023   CL 103 04/19/2023   CO2 22 04/19/2023   GLUCOSE 131 (H) 04/19/2023   BUN 17 04/19/2023   CREATININE 1.44 (H) 04/19/2023   BILITOT 0.5 04/19/2023   ALKPHOS 93 04/19/2023   AST 10 04/19/2023   ALT 14 04/19/2023   PROT 7.3 04/19/2023   ALBUMIN 4.2 04/19/2023   CALCIUM 9.5 04/19/2023   GFRAA 61 12/10/2020    Speciality Comments: PLQ eye exam: 04/21/2023 WNL @ [facility], Dr. Sherryll Burger. Follow up in 1 year.  Procedures:  No procedures performed Allergies: Ibuprofen, Lisinopril, and Strawberry [berry]   Assessment / Plan:     Visit Diagnoses: Rheumatoid arthritis involving multiple sites with positive rheumatoid  factor (HCC) -He has ongoing joint pain involving multiple joints.  His pain has been most severe in both hands, both knees, both feet.  He has noticed intermittent inflammation of the left ankle.  No synovitis was noted on examination today.  He was initiated on Plaquenil after his last office visit on 01/20/2023.  He has been tolerating Plaquenil without any side effects.  He has been taking Plaquenil 200 mg 1 tablet by mouth daily.  He has not yet noticed any clinical benefit since initiating Plaquenil but is willing to give it more time and for [place] to reassess for the full efficacy at his follow-up visit.  Discussed that at his follow-up visit if he continues to have no response we will discontinue the use of Plaquenil.  He was in agreement.  He will follow up in the office in 3 months or sooner if needed.  Plan: hydroxychloroquine (PLAQUENIL) 200 MG tablet  High risk medication use -Plaquenil 200 mg 1 tablet by mouth daily.  Plaquenil was initiated after his last office visit on 01/20/2023.  PLQ eye exam: 04/21/2023 WNL @ [facility], Dr. Sherryll Burger. Follow up in 1 year.  CMP updated on 04/19/23. CBC updated on 02/23/23.  Plan to update CBC and CMP at his scheduled follow-up visit and then every 5 months if Plaquenil was continued.Plan: hydroxychloroquine (PLAQUENIL) 200 MG tablet  Primary osteoarthritis of both hands: He has PIP and DIP thickening consistent with osteoarthritis of both hands.  He has tenderness over several PIP joints.  Discussed the importance of joint protection and muscle strengthening.  Primary osteoarthritis of both knees: Good range of motion of both knee joints noted on examination today.  He continues to experience intermittent pain and stiffness in both knees.  Primary osteoarthritis of both feet: He continues to experience intermittent discomfort in both feet.  He has had intermittent swelling in the left ankle.  On examination today he has tenderness along the left ankle joint line  but no active synovitis was noted.  He will remain on Plaquenil as prescribed for now.  DDD (degenerative disc disease), lumbar: No symptoms of radiculopathy at this time.  Hyperuricemia: No signs or symptoms of a gout flare.  He remains on allopurinol 300 mg daily.  Essential hypertension, benign: Blood pressure was 138/81 today in the office.  Other medical conditions are listed as follows:  Type 2 diabetes mellitus with diabetic neuropathy, unspecified whether long term insulin use (HCC)  Stage 3b chronic kidney disease (HCC)  Macrocytic anemia  Vitamin D deficiency  Hyperlipidemia LDL goal <100  B12 deficiency  Anxiety and depression  Seasonal allergies  BMI 29.0-29.9,adult   Orders: No orders of the defined types were placed in this encounter.  Meds ordered this encounter  Medications   hydroxychloroquine (PLAQUENIL) 200 MG tablet    Sig: Take 1 tablet (200 mg total) by mouth daily.    Dispense:  90 tablet    Refill:  0    Follow-Up Instructions: Return in about 3 months (around 07/26/2023) for Rheumatoid arthritis, Osteoarthritis.   Gearldine Bienenstock, PA-C  Note - This record has been created using Dragon software.  Chart creation errors have been sought, but may not always  have been located. Such creation errors do not reflect on  the standard of medical care.

## 2023-04-27 ENCOUNTER — Encounter: Payer: Self-pay | Admitting: Internal Medicine

## 2023-04-27 ENCOUNTER — Ambulatory Visit (INDEPENDENT_AMBULATORY_CARE_PROVIDER_SITE_OTHER): Payer: Medicare HMO | Admitting: Internal Medicine

## 2023-04-27 DIAGNOSIS — Z0000 Encounter for general adult medical examination without abnormal findings: Secondary | ICD-10-CM | POA: Diagnosis not present

## 2023-04-27 NOTE — Progress Notes (Signed)
Subjective:   Norman Peters is a 67 y.o. male who presents for Medicare Annual/Subsequent preventive examination.  Visit Complete: Virtual  I connected with  Norman Peters on 04/27/23 by a audio enabled telemedicine application and verified that I am speaking with the correct person using two identifiers.  Patient Location: Home  Provider Location: Office/Clinic  I discussed the limitations of evaluation and management by telemedicine. The patient expressed understanding and agreed to proceed.  Patient Medicare AWV questionnaire was completed by the patient on 04/27/2023; I have confirmed that all information answered by patient is correct and no changes since this date.  Review of Systems    Review of Systems  All other systems reviewed and are negative.   Objective:   Per patient no change in vitals since last visit, unable to obtain new vitals due to telehealth visit  Today's Vitals   04/27/23 1043  PainSc: 6    There is no height or weight on file to calculate BMI.     04/27/2023   10:52 AM 12/15/2022    2:06 PM 10/23/2022   12:20 AM 09/11/2022    9:45 PM 08/23/2022    3:04 PM 06/02/2022    2:17 PM 05/06/2022   12:33 AM  Advanced Directives  Does Patient Have a Medical Advance Directive? Yes No No No No No No  Does patient want to make changes to medical advance directive? Yes (MAU/Ambulatory/Procedural Areas - Information given)        Would patient like information on creating a medical advance directive?  No - Patient declined No - Patient declined Yes (ED - Information included in AVS) No - Patient declined No - Patient declined     Current Medications (verified) Outpatient Encounter Medications as of 04/27/2023  Medication Sig   acetaminophen (TYLENOL) 325 MG tablet Take 650 mg by mouth every 6 (six) hours as needed.   albuterol (VENTOLIN HFA) 108 (90 Base) MCG/ACT inhaler INHALE 2 PUFFS BY MOUTH ONCE DAILY AS NEEDED FOR WHEEZING FOR SHORTNESS OF BREATH    allopurinol (ZYLOPRIM) 300 MG tablet Take 1 tablet (300 mg total) by mouth daily.   amLODipine-olmesartan (AZOR) 10-40 MG tablet Take 1 tablet by mouth daily.   aspirin EC 81 MG tablet Take 81 mg by mouth every morning.   busPIRone (BUSPAR) 5 MG tablet Take 1 tablet (5 mg total) by mouth 2 (two) times daily.   chlorthalidone (HYGROTON) 25 MG tablet Take 1 tablet (25 mg total) by mouth daily.   FLUoxetine (PROZAC) 40 MG capsule TAKE 1 CAPSULE EVERY DAY   hydroxychloroquine (PLAQUENIL) 200 MG tablet Take 1 tablet (200 mg total) by mouth daily.   insulin glargine (LANTUS SOLOSTAR) 100 UNIT/ML Solostar Pen Inject 20 Units into the skin at bedtime.   methocarbamol (ROBAXIN) 750 MG tablet Take 1 tablet (750 mg total) by mouth 4 (four) times daily.   rosuvastatin (CRESTOR) 5 MG tablet TAKE 1 TABLET EVERY MONDAY, WEDNESDAY AND FRIDAY (DISCONTINUE PRAVASTATIN)   Semaglutide, 1 MG/DOSE, 4 MG/3ML SOPN Inject 1 mg as directed once a week.   TRUE METRIX BLOOD GLUCOSE TEST test strip TEST BLOOD SUGAR ONE TIME DAILY AS DIRECTED   No facility-administered encounter medications on file as of 04/27/2023.    Allergies (verified) Ibuprofen, Lisinopril, and Strawberry [berry]   History: Past Medical History:  Diagnosis Date   Acute left-sided low back pain with left-sided sciatica 11/04/2020   Allergy    Anemia    Anxiety    Arthritis  Blood transfusion without reported diagnosis    Cataract    Chronic kidney disease    Depression 2001   Diabetes mellitus 2010   GERD (gastroesophageal reflux disease)    Hyperlipidemia    Hypertension 2005   Past Surgical History:  Procedure Laterality Date   COLON SURGERY     COLONOSCOPY N/A 07/07/2016   Procedure: COLONOSCOPY;  Surgeon: Malissa Hippo, MD;  Location: [facility];  Service: Endoscopy;  Laterality: N/A;  1030   FINGER SURGERY Left 1974   fifth , trauma on job, had to replace/repair finger   HERNIA REPAIR  2006   x3, umbilical and bilateral  hernia   Family History  Problem Relation Age of Onset   Stroke Mother    Social History   Socioeconomic History   Marital status: Married    Spouse name: Not on file   Number of children: Not on file   Years of education: Not on file   Highest education level: 11th grade  Occupational History   Not on file  Tobacco Use   Smoking status: Former    Packs/day: 0.50    Years: 30.00    Additional pack years: 0.00    Total pack years: 15.00    Types: Cigarettes    Quit date: 01/20/2011    Years since quitting: 12.2    Passive exposure: Never   Smokeless tobacco: Never  Vaping Use   Vaping Use: Never used  Substance and Sexual Activity   Alcohol use: No   Drug use: No   Sexual activity: Not Currently  Other Topics Concern   Not on file  Social History Narrative   Not on file   Social Determinants of Health   Financial Resource Strain: Low Risk  (04/27/2023)   Overall Financial Resource Strain (CARDIA)    Difficulty of Paying Living Expenses: Not hard at all  Food Insecurity: No Food Insecurity (04/27/2023)   Hunger Vital Sign    Worried About Running Out of Food in the Last Year: Never true    Ran Out of Food in the Last Year: Never true  Transportation Needs: Unmet Transportation Needs (04/27/2023)   PRAPARE - Transportation    Lack of Transportation (Medical): Yes    Lack of Transportation (Non-Medical): Yes  Physical Activity: Sufficiently Active (04/27/2023)   Exercise Vital Sign    Days of Exercise per Week: 5 days    Minutes of Exercise per Session: 60 min  Recent Concern: Physical Activity - Insufficiently Active (04/27/2023)   Exercise Vital Sign    Days of Exercise per Week: 2 days    Minutes of Exercise per Session: 20 min  Stress: Stress Concern Present (04/27/2023)   [employer] of Occupational Health - Occupational Stress Questionnaire    Feeling of Stress : To some extent  Social Connections: Socially Integrated (04/27/2023)   Social Connection and  Isolation Panel [NHANES]    Frequency of Communication with Friends and Family: Three times a week    Frequency of Social Gatherings with Friends and Family: Once a week    Attends Religious Services: More than 4 times per year    Active Member of [employer] or Organizations: Yes    Attends Engineer, structural: More than 4 times per year    Marital Status: Married    Tobacco Counseling Counseling given: Not Answered   Clinical Intake:  Pre-visit preparation completed: Yes  Pain : 0-10 Pain Score: 6  Pain Type: Chronic pain Pain  Location: Knee Pain Descriptors / Indicators: Aching Pain Onset: More than a month ago Pain Frequency: Constant Pain Relieving Factors: Tylenol 650  Pain Relieving Factors: Tylenol 650  Nutritional Status: BMI > 30  Obese Diabetes: Yes CBG done?: Yes CBG resulted in Enter/ Edit results?: Yes (116) Did pt. bring in CBG monitor from home?: No  How often do you need to have someone help you when you read instructions, pamphlets, or other written materials from your doctor or pharmacy?: 1 - Never What is the last grade level you completed in school?: 11th grade         Activities of Daily Living    04/27/2023   10:47 AM 04/27/2023    7:10 AM  In your present state of health, do you have any difficulty performing the following activities:  Hearing? 0 0  Vision? 0 0  Difficulty concentrating or making decisions? 0 0  Walking or climbing stairs? 1 1  Dressing or bathing? 0 0  Doing errands, shopping? 0 0  Preparing Food and eating ?  N  Using the Toilet?  N  In the past six months, have you accidently leaked urine?  N  Do you have problems with loss of bowel control?  N  Managing your Medications?  N  Managing your Finances?  N  Housekeeping or managing your Housekeeping?  N    Patient Care Team: Kerri Perches, MD as PCP - General (Family Medicine) Jonelle Sidle, MD as PCP - Cardiology (Cardiology) Doreatha Massed,  MD as Medical Oncologist (Hematology)  Indicate any recent Medical Services you may have received from other than [facility] providers in the past year (date may be approximate).     Assessment:   This is a routine wellness examination for Ebenezer.  Hearing/Vision screen No results found.  Dietary issues and exercise activities discussed:     Goals Addressed             This Visit's Progress    Patient Stated       Would like to get A1C down <7  and lose weight        Depression Screen    04/27/2023   10:54 AM 04/22/2023    1:24 PM 02/24/2023   11:33 AM 11/11/2022    3:27 PM 09/15/2022    1:31 PM 06/30/2022    1:30 PM 06/30/2022    1:04 PM  PHQ 2/9 Scores  PHQ - 2 Score 0 1 2 2 1  0 0  PHQ- 9 Score  2 4 3 2       Fall Risk    04/27/2023   10:54 AM 04/27/2023    7:10 AM 04/22/2023    1:24 PM 02/24/2023   11:33 AM 11/11/2022    3:27 PM  Fall Risk   Falls in the past year? 0 0 0 0 0  Number falls in past yr:   0 0 0  Injury with Fall?   0 0 0  Risk for fall due to :   No Fall Risks  No Fall Risks  Follow up   Falls evaluation completed  Falls evaluation completed    MEDICARE RISK AT HOME:   TIMED UP AND GO:  Was the test performed?  No    Cognitive Function:    04/22/2022   10:41 AM  MMSE - Mini Mental State Exam  Not completed: Unable to complete        04/27/2023   10:54 AM 04/22/2022  10:41 AM 04/03/2021   10:31 AM 04/01/2020    8:50 AM 03/28/2019    9:45 AM  6CIT Screen  What Year? 0 points 0 points 0 points 0 points 0 points  What month? 0 points 0 points 0 points 0 points 0 points  What time? 0 points 0 points 0 points 0 points 0 points  Count back from 20 0 points 0 points 0 points 0 points 0 points  Months in reverse 4 points 0 points 0 points 4 points 0 points  Repeat phrase 4 points 0 points 0 points 0 points 0 points  Total Score 8 points 0 points 0 points 4 points 0 points    Immunizations Immunization History  Administered Date(s) Administered    Fluad Quad(high Dose 65+) 09/02/2020, 07/17/2021, 09/15/2022   Influenza,inj,Quad PF,6+ Mos 07/14/2016, 08/25/2017, 08/30/2018, 07/02/2019   Moderna Covid-19 Vaccine Bivalent Booster 53yrs & up 10/01/2022   PFIZER(Purple Top)SARS-COV-2 Vaccination 01/25/2020, 02/19/2020, 09/28/2020, 05/29/2021, 09/25/2021   PNEUMOCOCCAL CONJUGATE-20 07/17/2021   Pfizer Covid-19 Vaccine Bivalent Booster 34yrs & up 09/25/2021   Pneumococcal Conjugate-13 10/08/2022   Pneumococcal Polysaccharide-23 02/10/2016   Rsv, Bivalent, Protein Subunit Rsvpref,pf Verdis Frederickson) 10/08/2022   Tdap 07/04/2021   Zoster Recombinat (Shingrix) 07/08/2021, 09/09/2021    TDAP status: Up to date  Flu Vaccine status: Up to date  Pneumococcal vaccine status: Up to date  Covid-19 vaccine status: Completed vaccines  Qualifies for Shingles Vaccine? Yes   Zostavax completed No   Shingrix Completed?: Yes  Screening Tests Health Maintenance  Topic Date Due   COVID-19 Vaccine (7 - 2023-24 season) 11/26/2022   Medicare Annual Wellness (AWV)  04/23/2023   INFLUENZA VACCINE  06/09/2023   OPHTHALMOLOGY EXAM  07/15/2023   HEMOGLOBIN A1C  10/20/2023   Diabetic kidney evaluation - eGFR measurement  04/18/2024   FOOT EXAM  04/19/2024   Diabetic kidney evaluation - Urine ACR  04/21/2024   Colonoscopy  07/07/2026   DTaP/Tdap/Td (2 - Td or Tdap) 07/05/2031   Pneumonia Vaccine 7+ Years old  Completed   Hepatitis C Screening  Completed   Zoster Vaccines- Shingrix  Completed   HPV VACCINES  Aged Out    Health Maintenance  Health Maintenance Due  Topic Date Due   COVID-19 Vaccine (7 - 2023-24 season) 11/26/2022   Medicare Annual Wellness (AWV)  04/23/2023    Colorectal cancer screening: Type of screening: Colonoscopy. Completed 07/07/2016. Repeat every 10 years  Lung Cancer Screening: (Low Dose CT Chest recommended if Age 41-80 years, 20 pack-year currently smoking OR have quit w/in 15years.) does not qualify.   Additional  Screening:  Hepatitis C Screening: does not qualify; Completed 02/10/2016 Vision Screening: Recommended annual ophthalmology exams for early detection of glaucoma and other disorders of the eye. Is the patient up to date with their annual eye exam?  Yes  Who is the provider or what is the name of the office in which the patient attends annual eye exams? [facility] If pt is not established with a provider, would they like to be referred to a provider to establish care? No .   Dental Screening: Recommended annual dental exams for proper oral hygiene  Diabetic Foot Exam: Diabetic Foot Exam: Completed 04/20/2023  Community Resource Referral / Chronic Care Management: CRR required this visit?  No   CCM required this visit?  No     Plan:     I have personally reviewed and noted the following in the patient's chart:   Medical and social  history Use of alcohol, tobacco or illicit drugs  Current medications and supplements including opioid prescriptions. Patient is not currently taking opioid prescriptions. Functional ability and status Nutritional status Physical activity Advanced directives List of other physicians Hospitalizations, surgeries, and ER visits in previous 12 months Vitals Screenings to include cognitive, depression, and falls Referrals and appointments  In addition, I have reviewed and discussed with patient certain preventive protocols, quality metrics, and best practice recommendations. A written personalized care plan for preventive services as well as general preventive health recommendations were provided to patient.     Milus Banister, MD   04/27/2023   After Visit Summary: (MyChart) Due to this being a telephonic visit, the after visit summary with patients personalized plan was offered to patient via MyChart

## 2023-04-27 NOTE — Patient Instructions (Signed)
  Norman Peters , Thank you for taking time to come for your Medicare Wellness Visit. I appreciate your ongoing commitment to your health goals. Please review the following plan we discussed and let me know if I can assist you in the future.   These are the goals we discussed:  Goals      Patient Stated     Would like to get A1C down <7  and lose weight         This is a list of the screening recommended for you and due dates:  Health Maintenance  Topic Date Due   COVID-19 Vaccine (7 - 2023-24 season) 11/26/2022   Medicare Annual Wellness Visit  04/23/2023   Flu Shot  06/09/2023   Eye exam for diabetics  07/15/2023   Hemoglobin A1C  10/20/2023   Yearly kidney function blood test for diabetes  04/18/2024   Complete foot exam   04/19/2024   Yearly kidney health urinalysis for diabetes  04/21/2024   Colon Cancer Screening  07/07/2026   DTaP/Tdap/Td vaccine (2 - Td or Tdap) 07/05/2031   Pneumonia Vaccine  Completed   Hepatitis C Screening  Completed   Zoster (Shingles) Vaccine  Completed   HPV Vaccine  Aged Out

## 2023-04-28 ENCOUNTER — Other Ambulatory Visit: Payer: Self-pay | Admitting: Family Medicine

## 2023-05-02 ENCOUNTER — Encounter: Payer: Self-pay | Admitting: Nurse Practitioner

## 2023-05-11 ENCOUNTER — Ambulatory Visit (INDEPENDENT_AMBULATORY_CARE_PROVIDER_SITE_OTHER): Payer: Medicare HMO | Admitting: Internal Medicine

## 2023-05-11 ENCOUNTER — Encounter: Payer: Self-pay | Admitting: Internal Medicine

## 2023-05-11 VITALS — BP 118/73 | HR 85 | Ht 68.0 in | Wt 202.1 lb

## 2023-05-11 DIAGNOSIS — M170 Bilateral primary osteoarthritis of knee: Secondary | ICD-10-CM | POA: Diagnosis not present

## 2023-05-11 MED ORDER — BUPIVACAINE HCL (PF) 0.25 % IJ SOLN
4.0000 mL | Freq: Once | INTRAMUSCULAR | Status: AC
Start: 2023-05-11 — End: 2023-05-11
  Administered 2023-05-11: 4 mL

## 2023-05-11 MED ORDER — TRIAMCINOLONE ACETONIDE 40 MG/ML IJ SUSP
40.0000 mg | Freq: Once | INTRAMUSCULAR | Status: AC
Start: 2023-05-11 — End: 2023-05-11
  Administered 2023-05-11: 40 mg via INTRAMUSCULAR

## 2023-05-11 MED ORDER — LIDOCAINE HCL 1 % IJ SOLN
2.0000 mL | Freq: Once | INTRAMUSCULAR | Status: AC
Start: 2023-05-11 — End: 2023-05-11
  Administered 2023-05-11: 2 mL via INTRADERMAL

## 2023-05-11 NOTE — Patient Instructions (Signed)
Thank you, Mr.Nazario Feutz for allowing [place] to provide your care today.   Your pain is due to arthritis. These are the different medications you can take for this: Tylenol 500mg  1-2 tabs three times a day for pain. Capsaicin, aspercreme, or biofreeze topically up to four times a day may also help with pain. Some supplements that may help for arthritis: Boswellia extract, curcumin, pycnogenol  Cortisone injection your received today.  If cortisone injections do not help, there are different types of shots that may help but they take longer to take effect. It's important that you continue to stay active. Straight leg raises, knee extensions 3 sets of 10 once a day (add ankle weight if these become too easy). Consider physical therapy to strengthen muscles around the joint that hurts to take pressure off of the joint itself if home exercises are not effective.  Shoe inserts with good arch support may be helpful. Heat or ice 15 minutes at a time 3-4 times a day as needed to help with pain. Water aerobics and cycling with low resistance are the best two types of exercise for arthritis though any exercise is ok as long as it doesn't worsen the pain. Follow up with me if your pain returns Go to ER if you get a red, hot , and swollen knee.         Thurmon Fair, M.D.

## 2023-05-11 NOTE — Progress Notes (Signed)
HPI:NormanNorman Peters is a 67 y.o. male with significant history of rheumatoid arthritis. DDD of lumbar spine and chronic bilateral osteoarthritis of knees who presents with bilateral knee pain. For the details of today's visit, please refer to the assessment and plan.  Physical Exam: Vitals:   05/11/23 0831  BP: 118/73  Pulse: 85  SpO2: 93%  Weight: 202 lb 1.9 oz (91.7 kg)  Height: 5\' 8"  (1.727 m)     Physical Exam Constitutional:      General: He is not in acute distress.    Appearance: He is not ill-appearing.  Musculoskeletal:     Comments: Left Knee: - Inspection: Valgus knee. No effusion. No erythema or bruising. Skin intact - Palpation: no TTP - ROM: full active ROM with flexion and extension in knee, with crepitus - Strength: 5/5 strength - Neuro/vasc: NV intact - Special Tests: - LIGAMENTS: negative anterior/posterior drawer no MCL or LCL laxity  Right Knee: - Inspection: Varus knee. No effusion. No erythema or bruising. Skin intact - Palpation: no TTP - ROM: full active ROM with flexion and extension in knee, with crepitus - Strength: 5/5 strength - Neuro/vasc: NV intact - Special Tests: - LIGAMENTS: negative anterior/posterior drawer no MCL or LCL laxity         Assessment & Plan:   [name] was seen today for knee pain.  Bilateral primary osteoarthritis of knee Assessment & Plan: Patient continues to have bilateral knee pain. He is trying to put off knee replacement. He is aware to only current cure we have to offer is knee replacement. He has been evaluated by Dr.Harrison .Reviewed prior imaging and patient had knee Xrays in January showing severe osteoarthritis of both knees. He would like to have corticosteroid knee injections today. Evaluated with [facility] and patient has minimal effusion on left and right knee.   Aspiration/Injection Procedure Note Norman Peters Jan 23, 1956  Procedure: Injection Indications: left knee osteoarthitis Risks of procedure  as well as the alternatives and risks of each were explained to the patient. Consent for procedure obtained.    After a time out was preformed, the knee was prepped in a sterile fashion. Cold spray was applied to the skin over the insertion site. The left knee superior lateral suprapatellar pouch was injected using 2 cc of 1% lidocaine on a 23-gauge 1-1/2 inch needle.  The syringe was switched to mixture containing 1 cc's of 40 mg Kenalog and 4 cc's of .25% Bupivacaine was injected.  Ultrasound was used. A sterile dressing was applied. The patient tolerated the procedures well without complication.   Aspiration/Injection Procedure Note Norman Peters 1956-07-14  Procedure: Injection Indications: right knee osteoarthitis Risks of procedure as well as the alternatives and risks of each were explained to the patient. Consent for procedure obtained.    After a time out was preformed, the knee was prepped in a sterile fashion. Cold spray was applied to the skin over the insertion site. The right knee superior lateral suprapatellar pouch was injected using 2 cc of 1% lidocaine on a 23-gauge 1-1/2 inch needle.  The syringe was switched to mixture containing 1 cc's of 40 mg Kenalog and 4 cc's of .25% Bupivacaine was injected.  Ultrasound was used. A sterile dressing was applied. The patient tolerated the procedures well without complication.     Orders: -     BUPivacaine HCl (PF) -     BUPivacaine HCl (PF) -     Triamcinolone Acetonide -     Triamcinolone Acetonide -  Lidocaine HCl -     Lidocaine HCl      Norman Banister, MD

## 2023-05-15 DIAGNOSIS — M170 Bilateral primary osteoarthritis of knee: Secondary | ICD-10-CM | POA: Insufficient documentation

## 2023-05-15 NOTE — Assessment & Plan Note (Signed)
Patient continues to have bilateral knee pain. He is trying to put off knee replacement. He is aware to only current cure we have to offer is knee replacement. He has been evaluated by Dr.Harrison .Reviewed prior imaging and patient had knee Xrays in January showing severe osteoarthritis of both knees. He would like to have corticosteroid knee injections today. Evaluated with [facility] and patient has minimal effusion on left and right knee.   Aspiration/Injection Procedure Note Joselito Choudry 1956/01/21  Procedure: Injection Indications: left knee osteoarthitis Risks of procedure as well as the alternatives and risks of each were explained to the patient. Consent for procedure obtained.    After a time out was preformed, the knee was prepped in a sterile fashion. Cold spray was applied to the skin over the insertion site. The left knee superior lateral suprapatellar pouch was injected using 2 cc of 1% lidocaine on a 23-gauge 1-1/2 inch needle.  The syringe was switched to mixture containing 1 cc's of 40 mg Kenalog and 4 cc's of .25% Bupivacaine was injected.  Ultrasound was used. A sterile dressing was applied. The patient tolerated the procedures well without complication.   Aspiration/Injection Procedure Note Ozziel Kobza 1956-10-08  Procedure: Injection Indications: right knee osteoarthitis Risks of procedure as well as the alternatives and risks of each were explained to the patient. Consent for procedure obtained.    After a time out was preformed, the knee was prepped in a sterile fashion. Cold spray was applied to the skin over the insertion site. The right knee superior lateral suprapatellar pouch was injected using 2 cc of 1% lidocaine on a 23-gauge 1-1/2 inch needle.  The syringe was switched to mixture containing 1 cc's of 40 mg Kenalog and 4 cc's of .25% Bupivacaine was injected.  Ultrasound was used. A sterile dressing was applied. The patient tolerated the procedures well without  complication.

## 2023-05-18 ENCOUNTER — Other Ambulatory Visit: Payer: Self-pay | Admitting: Family Medicine

## 2023-05-29 DIAGNOSIS — E1165 Type 2 diabetes mellitus with hyperglycemia: Secondary | ICD-10-CM | POA: Diagnosis not present

## 2023-05-29 DIAGNOSIS — E1122 Type 2 diabetes mellitus with diabetic chronic kidney disease: Secondary | ICD-10-CM | POA: Diagnosis not present

## 2023-05-30 ENCOUNTER — Other Ambulatory Visit: Payer: Self-pay

## 2023-05-30 ENCOUNTER — Other Ambulatory Visit (HOSPITAL_COMMUNITY): Payer: Self-pay

## 2023-06-09 ENCOUNTER — Other Ambulatory Visit: Payer: Self-pay | Admitting: Family Medicine

## 2023-07-01 ENCOUNTER — Ambulatory Visit: Payer: Medicare HMO | Admitting: Family Medicine

## 2023-07-02 ENCOUNTER — Other Ambulatory Visit: Payer: Self-pay | Admitting: Family Medicine

## 2023-07-02 DIAGNOSIS — J302 Other seasonal allergic rhinitis: Secondary | ICD-10-CM

## 2023-07-02 DIAGNOSIS — R062 Wheezing: Secondary | ICD-10-CM

## 2023-07-05 ENCOUNTER — Encounter: Payer: Self-pay | Admitting: Family Medicine

## 2023-07-05 ENCOUNTER — Other Ambulatory Visit (HOSPITAL_COMMUNITY): Payer: Self-pay

## 2023-07-05 ENCOUNTER — Ambulatory Visit: Payer: Medicare HMO | Admitting: Family Medicine

## 2023-07-05 ENCOUNTER — Other Ambulatory Visit: Payer: Self-pay

## 2023-07-05 VITALS — BP 109/73 | HR 88 | Ht 68.0 in | Wt 200.0 lb

## 2023-07-05 DIAGNOSIS — F419 Anxiety disorder, unspecified: Secondary | ICD-10-CM | POA: Diagnosis not present

## 2023-07-05 DIAGNOSIS — H6593 Unspecified nonsuppurative otitis media, bilateral: Secondary | ICD-10-CM

## 2023-07-05 DIAGNOSIS — F32A Depression, unspecified: Secondary | ICD-10-CM

## 2023-07-05 DIAGNOSIS — I10 Essential (primary) hypertension: Secondary | ICD-10-CM | POA: Diagnosis not present

## 2023-07-05 DIAGNOSIS — G8929 Other chronic pain: Secondary | ICD-10-CM

## 2023-07-05 DIAGNOSIS — Z23 Encounter for immunization: Secondary | ICD-10-CM | POA: Diagnosis not present

## 2023-07-05 DIAGNOSIS — J302 Other seasonal allergic rhinitis: Secondary | ICD-10-CM | POA: Diagnosis not present

## 2023-07-05 DIAGNOSIS — M25562 Pain in left knee: Secondary | ICD-10-CM

## 2023-07-05 DIAGNOSIS — M0579 Rheumatoid arthritis with rheumatoid factor of multiple sites without organ or systems involvement: Secondary | ICD-10-CM | POA: Diagnosis not present

## 2023-07-05 DIAGNOSIS — R062 Wheezing: Secondary | ICD-10-CM | POA: Diagnosis not present

## 2023-07-05 DIAGNOSIS — E1140 Type 2 diabetes mellitus with diabetic neuropathy, unspecified: Secondary | ICD-10-CM | POA: Diagnosis not present

## 2023-07-05 DIAGNOSIS — M25561 Pain in right knee: Secondary | ICD-10-CM | POA: Diagnosis not present

## 2023-07-05 MED ORDER — CEPHALEXIN 500 MG PO CAPS: 500.0000 mg | ORAL_CAPSULE | Freq: Three times a day (TID) | ORAL | 0 refills | Status: DC

## 2023-07-05 MED ORDER — BUSPIRONE HCL 5 MG PO TABS: 5.0000 mg | ORAL_TABLET | Freq: Three times a day (TID) | ORAL | 5 refills | Status: DC

## 2023-07-05 MED ORDER — ROSUVASTATIN CALCIUM 5 MG PO TABS: ORAL_TABLET | ORAL | 3 refills | Status: DC

## 2023-07-05 MED ORDER — ALBUTEROL SULFATE HFA 108 (90 BASE) MCG/ACT IN AERS
2.0000 | INHALATION_SPRAY | Freq: Every day | RESPIRATORY_TRACT | 0 refills | Status: AC | PRN
Start: 2023-07-05 — End: ?

## 2023-07-05 MED ORDER — ALBUTEROL SULFATE HFA 108 (90 BASE) MCG/ACT IN AERS
2.0000 | INHALATION_SPRAY | Freq: Every day | RESPIRATORY_TRACT | 0 refills | Status: DC | PRN
  Filled 2023-07-05: qty 6.7, 100d supply, fill #0

## 2023-07-05 NOTE — Progress Notes (Signed)
Norman Peters     MRN: [id number]      DOB: [birth year]  Chief Complaint  Patient presents with   Hypertension    F/up for Hypertension   Knee Pain    Follow up for Knee pain. Has gotten two shots in knee but pain is returning.    Ear Fullness    Pt. States. That both his ears stay congested, he feels like there is a blockage.    Medication Refill    Pt. Is requesting refills of Buspar wants to increase to 10 mg, Crestor is out of refills, Rosuvastatin 5mg . Needs albuterol inhaler refill as well.     HPI [name] is here for follow up and re-evaluation of chronic medical conditions, medication management and review of any available recent lab and radiology data.  Preventive health is updated, specifically  Cancer screening and Immunization.   Questions or concerns regarding consultations or procedures which the PT has had in the interim are  addressed. The PT denies any adverse reactions to current medications since the last visit. Concerns as above Denies polyuria, polydipsia, blurred vision , or hypoglycemic episodes. Blood sugar doping very well   ROS Denies recent fever or chills. Denies sinus pressure, nasal congestion, ear pain or sore throat. Denies chest congestion, productive cough or wheezing. Denies chest pains, palpitations and leg swelling Denies abdominal pain, nausea, vomiting,diarrhea or constipation.   Denies dysuria, frequency, hesitancy or incontinence. . Denies headaches, seizures, numbness, or tingling. Denies depression, anxiety or insomnia. Denies skin break down or rash.   PE  BP 109/73 (BP Location: Left Arm, Patient Position: Sitting, Cuff Size: Large)   Pulse 88   Ht 5\' 8"  (1.727 m)   Wt 200 lb (90.7 kg)   SpO2 92%   BMI 30.41 kg/m   Patient alert and oriented and in no cardiopulmonary distress.  HEENT: No facial asymmetry, EOMI,     Neck supple .Tm erythematous bilaterally  Chest: Clear to auscultation bilaterally.  CVS: S1, S2 no  murmurs, no S3.Regular rate.  ABD: Soft non tender.   Ext: No edema  MS: Adequate ROM spine, shoulders, hips and reduxced in  knees.  Skin: Intact, no ulcerations or rash noted.  Psych: Good eye contact, normal affect. Memory intact not anxious or depressed appearing.  CNS: CN 2-12 intact, power,  normal throughout.no focal deficits noted.   Assessment & Plan  Anxiety and depression Anxiety uncontrolled increase frequency of buspar to 3 times daily  Immunization due After obtaining informed consent, the  influenza vaccine is  administered , with no adverse effect noted at the time of administration.   Type 2 diabetes mellitus with diabetic neuropathy, unspecified [facility]) [name] is reminded of the importance of commitment to daily physical activity for 30 minutes or more, as able and the need to limit carbohydrate intake to 30 to 60 grams per meal to help with blood sugar control.   The need to take medication as prescribed, test blood sugar as directed, and to call between visits if there is a concern that blood sugar is uncontrolled is also discussed.   [name] is reminded of the importance of daily foot exam, annual eye examination, and good blood sugar, blood pressure and cholesterol control. Controlled and managed by endo     Latest Ref Rng & Units 04/22/2023    2:10 PM 04/20/2023    9:18 AM 04/19/2023   10:55 AM 02/23/2023    9:16 AM 11/19/2022  9:23 AM  Diabetic Labs  HbA1c 4.0 - 5.6 %  7.0    6.8   Micro/Creat Ratio 0 - 29 mg/g creat 303       Chol 100 - 199 mg/dL   630     HDL >16 mg/dL   36     Calc LDL 0 - 99 mg/dL   36     Triglycerides 0 - 149 mg/dL   010     Creatinine 9.32 - 1.27 mg/dL   3.55  7.32        12/10/5425    2:48 PM 05/11/2023    8:31 AM 04/25/2023    1:18 PM 04/25/2023   12:50 PM 04/22/2023    1:25 PM 04/22/2023    1:23 PM 04/20/2023    9:06 AM  BP/Weight  Systolic BP 109 118 138 150 136 153 148  Diastolic BP 73 73 81 76 80 82 90  Wt.  (Lbs) 200 202.12  207.6  209.12   BMI 30.41 kg/m2 30.73 kg/m2  31.57 kg/m2  31.8 kg/m2       04/20/2023    9:30 AM 01/15/2022    1:00 PM  Foot/eye exam completion dates  Foot Form Completion Done Done        Chronic pain of both knees Worsening, refer ortho for management, has benefited from intra articular injections  Bilateral otitis media Keflex prescribed, he will call back if symptoms persist and I will refer to ENT  Essential hypertension, benign Controlled, no change in medication DASH diet and commitment to daily physical activity for a minimum of 30 minutes discussed and encouraged, as a part of hypertension management. The importance of attaining a healthy weight is also discussed.     07/05/2023    2:48 PM 05/11/2023    8:31 AM 04/25/2023    1:18 PM 04/25/2023   12:50 PM 04/22/2023    1:25 PM 04/22/2023    1:23 PM 04/20/2023    9:06 AM  BP/Weight  Systolic BP 109 118 138 150 136 153 148  Diastolic BP 73 73 81 76 80 82 90  Wt. (Lbs) 200 202.12  207.6  209.12   BMI 30.41 kg/m2 30.73 kg/m2  31.57 kg/m2  31.8 kg/m2        Rheumatoid arthritis involving multiple sites with positive rheumatoid factor (HCC) Has decided against definitive treatment due to s/e of mediocations

## 2023-07-05 NOTE — Patient Instructions (Signed)
Annual exam 2nd week in January, call if you need me sooner  Flu vaccine in office today.  You are referred to orthopedics regarding knee pain, that office will call you.  BuSpar dose is increased to 3 times daily as discussed.  10-day course of Keflex is prescribed for bilateral otitis media ear infection.  If ear pain persists please call and we will refer you to ENT for further evaluation.  Congrats on improved health, keep it up!  Thanks for choosing [facility], we consider it a privelige to serve you.

## 2023-07-12 ENCOUNTER — Encounter: Payer: Self-pay | Admitting: Family Medicine

## 2023-07-12 DIAGNOSIS — G8929 Other chronic pain: Secondary | ICD-10-CM | POA: Insufficient documentation

## 2023-07-12 DIAGNOSIS — Z23 Encounter for immunization: Secondary | ICD-10-CM | POA: Insufficient documentation

## 2023-07-12 DIAGNOSIS — H6693 Otitis media, unspecified, bilateral: Secondary | ICD-10-CM | POA: Insufficient documentation

## 2023-07-12 NOTE — Assessment & Plan Note (Signed)
Worsening, refer ortho for management, has benefited from intra articular injections

## 2023-07-12 NOTE — Assessment & Plan Note (Signed)
Norman Peters is reminded of the importance of commitment to daily physical activity for 30 minutes or more, as able and the need to limit carbohydrate intake to 30 to 60 grams per meal to help with blood sugar control.   The need to take medication as prescribed, test blood sugar as directed, and to call between visits if there is a concern that blood sugar is uncontrolled is also discussed.   Norman Peters is reminded of the importance of daily foot exam, annual eye examination, and good blood sugar, blood pressure and cholesterol control. Controlled and managed by endo     Latest Ref Rng & Units 04/22/2023    2:10 PM 04/20/2023    9:18 AM 04/19/2023   10:55 AM 02/23/2023    9:16 AM 11/19/2022    9:23 AM  Diabetic Labs  HbA1c 4.0 - 5.6 %  7.0    6.8   Micro/Creat Ratio 0 - 29 mg/g creat 303       Chol 100 - 199 mg/dL   027     HDL >25 mg/dL   36     Calc LDL 0 - 99 mg/dL   36     Triglycerides 0 - 149 mg/dL   366     Creatinine 4.40 - 1.27 mg/dL   3.47  4.25        9/56/3875    2:48 PM 05/11/2023    8:31 AM 04/25/2023    1:18 PM 04/25/2023   12:50 PM 04/22/2023    1:25 PM 04/22/2023    1:23 PM 04/20/2023    9:06 AM  BP/Weight  Systolic BP 109 118 138 150 136 153 148  Diastolic BP 73 73 81 76 80 82 90  Wt. (Lbs) 200 202.12  207.6  209.12   BMI 30.41 kg/m2 30.73 kg/m2  31.57 kg/m2  31.8 kg/m2       04/20/2023    9:30 AM 01/15/2022    1:00 PM  Foot/eye exam completion dates  Foot Form Completion Done Done

## 2023-07-12 NOTE — Assessment & Plan Note (Signed)
Keflex prescribed, he will call back if symptoms persist and I will refer to ENT

## 2023-07-12 NOTE — Assessment & Plan Note (Signed)
Anxiety uncontrolled increase frequency of buspar to 3 times daily

## 2023-07-12 NOTE — Assessment & Plan Note (Signed)
Has decided against definitive treatment due to s/e of mediocations

## 2023-07-12 NOTE — Assessment & Plan Note (Signed)
Controlled, no change in medication DASH diet and commitment to daily physical activity for a minimum of 30 minutes discussed and encouraged, as a part of hypertension management. The importance of attaining a healthy weight is also discussed.     07/05/2023    2:48 PM 05/11/2023    8:31 AM 04/25/2023    1:18 PM 04/25/2023   12:50 PM 04/22/2023    1:25 PM 04/22/2023    1:23 PM 04/20/2023    9:06 AM  BP/Weight  Systolic BP 109 118 138 150 136 153 148  Diastolic BP 73 73 81 76 80 82 90  Wt. (Lbs) 200 202.12  207.6  209.12   BMI 30.41 kg/m2 30.73 kg/m2  31.57 kg/m2  31.8 kg/m2

## 2023-07-12 NOTE — Assessment & Plan Note (Signed)
After obtaining informed consent, the influenza vaccine is  administered , with no adverse effect noted at the time of administration.

## 2023-07-19 ENCOUNTER — Other Ambulatory Visit: Payer: Self-pay | Admitting: Family Medicine

## 2023-07-25 ENCOUNTER — Ambulatory Visit: Payer: Medicare HMO | Admitting: Orthopedic Surgery

## 2023-07-25 ENCOUNTER — Ambulatory Visit (INDEPENDENT_AMBULATORY_CARE_PROVIDER_SITE_OTHER): Payer: Medicare HMO | Admitting: Orthopedic Surgery

## 2023-07-25 DIAGNOSIS — M170 Bilateral primary osteoarthritis of knee: Secondary | ICD-10-CM

## 2023-07-25 DIAGNOSIS — M25561 Pain in right knee: Secondary | ICD-10-CM

## 2023-07-25 DIAGNOSIS — G8929 Other chronic pain: Secondary | ICD-10-CM

## 2023-07-25 DIAGNOSIS — M25562 Pain in left knee: Secondary | ICD-10-CM | POA: Diagnosis not present

## 2023-07-25 MED ORDER — METHYLPREDNISOLONE ACETATE 40 MG/ML IJ SUSP
40.0000 mg | Freq: Once | INTRAMUSCULAR | Status: AC
Start: 2023-07-25 — End: 2023-07-25
  Administered 2023-07-25: 40 mg via INTRA_ARTICULAR

## 2023-07-25 NOTE — Progress Notes (Signed)
Chief Complaint  Patient presents with   Knee Problem   Follow-up patient with bilateral knee arthritis status post injections pain is starting to come back  Patient prefers not to go to the operating room for surgery  He requested bilateral knee injections again  Encounter Diagnoses  Name Primary?   Chronic pain of right knee Yes   Chronic pain of left knee    Primary osteoarthritis of both knees     Procedure note for bilateral knee injections  Procedure note left knee injection verbal consent was obtained to inject left knee joint  Timeout was completed to confirm the site of injection  The medications used were 40 mg depomedrol and 3 cc of 1% lidocaine  Anesthesia was provided by ethyl chloride and the skin was prepped with alcohol.  After cleaning the skin with alcohol a 20-gauge needle was used to inject the left knee joint. There were no complications. A sterile bandage was applied.   Procedure note right knee injection verbal consent was obtained to inject right knee joint  Timeout was completed to confirm the site of injection  The medications used were 40 mg depomedrol and 3 cc of 1% lidocaine  Anesthesia was provided by ethyl chloride and the skin was prepped with alcohol.  After cleaning the skin with alcohol a 20-gauge needle was used to inject the right knee joint. There were no complications. A sterile bandage was applied.

## 2023-07-26 ENCOUNTER — Encounter: Payer: Self-pay | Admitting: Nurse Practitioner

## 2023-07-26 ENCOUNTER — Ambulatory Visit (INDEPENDENT_AMBULATORY_CARE_PROVIDER_SITE_OTHER): Payer: Medicare HMO | Admitting: Nurse Practitioner

## 2023-07-26 VITALS — BP 112/71 | HR 92 | Ht 68.0 in | Wt 203.4 lb

## 2023-07-26 DIAGNOSIS — E1122 Type 2 diabetes mellitus with diabetic chronic kidney disease: Secondary | ICD-10-CM

## 2023-07-26 DIAGNOSIS — Z7985 Long-term (current) use of injectable non-insulin antidiabetic drugs: Secondary | ICD-10-CM

## 2023-07-26 DIAGNOSIS — I10 Essential (primary) hypertension: Secondary | ICD-10-CM

## 2023-07-26 DIAGNOSIS — E782 Mixed hyperlipidemia: Secondary | ICD-10-CM

## 2023-07-26 DIAGNOSIS — N1831 Long term (current) use of insulin: Secondary | ICD-10-CM

## 2023-07-26 DIAGNOSIS — Z794 Long term (current) use of insulin: Secondary | ICD-10-CM | POA: Diagnosis not present

## 2023-07-26 LAB — POCT GLYCOSYLATED HEMOGLOBIN (HGB A1C): Hemoglobin A1C: 7.3 % — AB (ref 4.0–5.6)

## 2023-07-26 MED ORDER — PEN NEEDLES 32G X 4 MM MISC: 6 refills | Status: DC

## 2023-07-26 MED ORDER — SEMAGLUTIDE (2 MG/DOSE) 8 MG/3ML ~~LOC~~ SOPN: 2.0000 mg | PEN_INJECTOR | SUBCUTANEOUS | 1 refills | Status: DC

## 2023-07-26 NOTE — Progress Notes (Signed)
Endocrinology follow-up note    07/26/2023, 8:55 AM   Subjective:    Patient ID: Norman Peters, male    DOB: 08-14-1956.  Norman Peters is being seen in follow-up after he was seen in consultation for management of currently uncontrolled symptomatic diabetes requested by  Kerri Perches, MD.   Past Medical History:  Diagnosis Date   Acute left-sided low back pain with left-sided sciatica 11/04/2020   Allergy    Anemia    Anxiety    Arthritis    Blood transfusion without reported diagnosis    Cataract    Chronic kidney disease    Depression 2001   Diabetes mellitus 2010   GERD (gastroesophageal reflux disease)    Hyperlipidemia    Hypertension 2005    Past Surgical History:  Procedure Laterality Date   COLON SURGERY     COLONOSCOPY N/A 07/07/2016   Procedure: COLONOSCOPY;  Surgeon: Malissa Hippo, MD;  Location: [facility];  Service: Endoscopy;  Laterality: N/A;  1030   FINGER SURGERY Left 1974   fifth , trauma on job, had to replace/repair finger   HERNIA REPAIR  2006   x3, umbilical and bilateral hernia    Social History   Socioeconomic History   Marital status: Married    Spouse name: Not on file   Number of children: Not on file   Years of education: Not on file   Highest education level: 11th grade  Occupational History   Not on file  Tobacco Use   Smoking status: Former    Current packs/day: 0.00    Average packs/day: 0.5 packs/day for 30.0 years (15.0 ttl pk-yrs)    Types: Cigarettes    Start date: 01/19/1981    Quit date: 01/20/2011    Years since quitting: 12.5    Passive exposure: Never   Smokeless tobacco: Never  Vaping Use   Vaping status: Never Used  Substance and Sexual Activity   Alcohol use: No   Drug use: No   Sexual activity: Not Currently  Other Topics Concern   Not on file  Social History Narrative   Not on file   Social Determinants of  Health   Financial Resource Strain: Low Risk  (04/27/2023)   Overall Financial Resource Strain (CARDIA)    Difficulty of Paying Living Expenses: Not hard at all  Food Insecurity: No Food Insecurity (04/27/2023)   Hunger Vital Sign    Worried About Running Out of Food in the Last Year: Never true    Ran Out of Food in the Last Year: Never true  Transportation Needs: Unmet Transportation Needs (04/27/2023)   PRAPARE - Transportation    Lack of Transportation (Medical): Yes    Lack of Transportation (Non-Medical): Yes  Physical Activity: Sufficiently Active (04/27/2023)   Exercise Vital Sign    Days of Exercise per Week: 5 days    Minutes of Exercise per Session: 60 min  Recent Concern: Physical Activity - Insufficiently Active (04/27/2023)   Exercise Vital Sign    Days of Exercise per Week: 2 days    Minutes of Exercise per Session: 20 min  Stress: Stress Concern Present (04/27/2023)   [employer] of Occupational Health - Occupational Stress Questionnaire    Feeling of Stress : To some extent  Social Connections: Socially Integrated (04/27/2023)   Social Connection and Isolation Panel [NHANES]    Frequency of Communication with Friends and Family: Three times a week    Frequency of Social Gatherings with Friends and Family: Once a week    Attends Religious Services: More than 4 times per year    Active Member of [employer] or Organizations: Yes    Attends Engineer, structural: More than 4 times per year    Marital Status: Married    Family History  Problem Relation Age of Onset   Stroke Mother     Outpatient Encounter Medications as of 07/26/2023  Medication Sig   acetaminophen (TYLENOL) 325 MG tablet Take 650 mg by mouth every 6 (six) hours as needed.   albuterol (VENTOLIN HFA) 108 (90 Base) MCG/ACT inhaler Inhale 2 puffs into the lungs daily as needed, for wheezing and shortness of breath.   allopurinol (ZYLOPRIM) 300 MG tablet Take 1 tablet by mouth once daily    amLODipine-olmesartan (AZOR) 10-40 MG tablet Take 1 tablet by mouth daily.   aspirin EC 81 MG tablet Take 81 mg by mouth every morning.   busPIRone (BUSPAR) 5 MG tablet Take 1 tablet (5 mg total) by mouth 3 (three) times daily.   chlorthalidone (HYGROTON) 25 MG tablet Take 1 tablet by mouth once daily   FLUoxetine (PROZAC) 40 MG capsule TAKE 1 CAPSULE EVERY DAY   Insulin Pen Needle (PEN NEEDLES) 32G X 4 MM MISC Use to inject insulin once daily   LANTUS SOLOSTAR 100 UNIT/ML Solostar Pen INJECT 45 UNITS UNDER THE SKIN EVERY MORNING (DOSE INCREASE)   methocarbamol (ROBAXIN) 750 MG tablet Take 1 tablet (750 mg total) by mouth 4 (four) times daily.   rosuvastatin (CRESTOR) 5 MG tablet TAKE 1 TABLET EVERY MONDAY, WEDNESDAY AND FRIDAY (DISCONTINUE PRAVASTATIN)   Semaglutide, 2 MG/DOSE, 8 MG/3ML SOPN Inject 2 mg as directed once a week.   TRUE METRIX BLOOD GLUCOSE TEST test strip TEST BLOOD SUGAR ONE TIME DAILY AS DIRECTED   [DISCONTINUED] Semaglutide, 1 MG/DOSE, 4 MG/3ML SOPN Inject 1 mg as directed once a week.   cephALEXin (KEFLEX) 500 MG capsule Take 1 capsule (500 mg total) by mouth 3 (three) times daily. (Patient not taking: Reported on 07/26/2023)   hydroxychloroquine (PLAQUENIL) 200 MG tablet Take 1 tablet (200 mg total) by mouth daily. (Patient not taking: Reported on 07/05/2023)   No facility-administered encounter medications on file as of 07/26/2023.    ALLERGIES: Allergies  Allergen Reactions   Ibuprofen Rash   Lisinopril Cough   Strawberry [Berry] Hives and Swelling    TONGUE SWELLING    VACCINATION STATUS: Immunization History  Administered Date(s) Administered   Fluad Quad(high Dose 65+) 09/02/2020, 07/17/2021, 09/15/2022   Fluad Trivalent(High Dose 65+) 07/05/2023   Influenza,inj,Quad PF,6+ Mos 07/14/2016, 08/25/2017, 08/30/2018, 07/02/2019   Moderna Covid-19 Vaccine Bivalent Booster 36yrs & up 10/01/2022   PFIZER(Purple Top)SARS-COV-2 Vaccination 01/25/2020, 02/19/2020,  09/28/2020, 05/29/2021, 09/25/2021   PNEUMOCOCCAL CONJUGATE-20 07/17/2021   Pfizer Covid-19 Vaccine Bivalent Booster 82yrs & up 09/25/2021   Pneumococcal Conjugate-13 10/08/2022   Pneumococcal Polysaccharide-23 02/10/2016   Rsv, Bivalent, Protein Subunit Rsvpref,pf Verdis Frederickson) 10/08/2022   Tdap 07/04/2021   Zoster Recombinant(Shingrix) 07/08/2021, 09/09/2021    Diabetes He presents for his follow-up diabetic visit. He has type 2 diabetes mellitus. Onset time: He was  diagnosed at approximate age of 48 years. His disease course has been worsening. Pertinent negatives for hypoglycemia include no confusion, headaches, pallor or seizures. Associated symptoms include fatigue. Pertinent negatives for diabetes include no blurred vision, no chest pain, no polydipsia, no polyphagia, no polyuria, no weakness and no weight loss. There are no hypoglycemic complications. Pertinent negatives for hypoglycemia complications include no required assistance (went to ED for hypoglycemia since last visit). Symptoms are improving. Diabetic complications include nephropathy and peripheral neuropathy. Risk factors for coronary artery disease include diabetes mellitus, dyslipidemia, obesity, hypertension, male sex, tobacco exposure and sedentary lifestyle. Current diabetic treatment includes insulin injections (and Ozempic). He is compliant with treatment most of the time. His weight is decreasing steadily. He is following a generally healthy diet. When asked about meal planning, he reported none. He has had a previous visit with a dietitian. He rarely participates in exercise. His home blood glucose trend is increasing steadily. His overall blood glucose range is 180-200 mg/dl. (He presents today with his meter and CGM showing at slightly above target glycemic profile overall.  His POCT A1c today is 7.3%, increasing slightly from last visit of 7%.  Analysis of his CGM shows TIR 41%, TAR 59%, TBR 0%, with a GMI of 8%.  He denies any  significant hypoglycemia.  He notes his diet has slipped in recent weeks, says he tends to eat his feelings and has been a bit more down lately.) An ACE inhibitor/angiotensin II receptor blocker is not being taken. He does not see a podiatrist.Eye exam is current.  Hyperlipidemia This is a chronic problem. The current episode started more than 1 year ago. The problem is uncontrolled. Recent lipid tests were reviewed and are variable. Exacerbating diseases include chronic renal disease, diabetes and obesity. Factors aggravating his hyperlipidemia include fatty foods. Pertinent negatives include no chest pain, myalgias or shortness of breath. Current antihyperlipidemic treatment includes statins. The current treatment provides mild improvement of lipids. Compliance problems include adherence to exercise and adherence to diet.  Risk factors for coronary artery disease include diabetes mellitus, dyslipidemia, male sex, obesity, hypertension and a sedentary lifestyle.  Hypertension This is a chronic problem. The current episode started more than 1 year ago. The problem has been resolved since onset. The problem is controlled. Pertinent negatives include no blurred vision, chest pain, headaches, neck pain, palpitations or shortness of breath. There are no associated agents to hypertension. Risk factors for coronary artery disease include diabetes mellitus, male gender, smoking/tobacco exposure, sedentary lifestyle, obesity and dyslipidemia. Past treatments include calcium channel blockers and diuretics. The current treatment provides mild improvement. There are no compliance problems.  Hypertensive end-organ damage includes kidney disease. Identifiable causes of hypertension include chronic renal disease.    Review of systems  Constitutional: + stable body weight,  current Body mass index is 30.93 kg/m. , no fatigue, no subjective hyperthermia, no subjective hypothermia Eyes: no blurry vision, no  xerophthalmia ENT: no sore throat, no nodules palpated in throat, no dysphagia/odynophagia, no hoarseness Cardiovascular: no chest pain, no shortness of breath, no palpitations, no leg swelling Respiratory: no cough, no shortness of breath Gastrointestinal: no nausea/vomiting/diarrhea Musculoskeletal: no muscle/joint aches Skin: no rashes, no hyperemia Neurological: no tremors, no numbness, no tingling, no dizziness Psychiatric: no depression, no anxiety   Objective:    BP 112/71 (BP Location: Right Arm, Patient Position: Sitting, Cuff Size: Large)   Pulse 92   Ht 5\' 8"  (1.727 m)   Wt 203 lb 6.4 oz (92.3 kg)  BMI 30.93 kg/m   Wt Readings from Last 3 Encounters:  07/26/23 203 lb 6.4 oz (92.3 kg)  07/05/23 200 lb (90.7 kg)  05/11/23 202 lb 1.9 oz (91.7 kg)    BP Readings from Last 3 Encounters:  07/26/23 112/71  07/05/23 109/73  05/11/23 118/73     Physical Exam- Limited  Constitutional:  Body mass index is 30.93 kg/m. , not in acute distress, normal state of mind Eyes:  EOMI, no exophthalmos Musculoskeletal: no gross deformities, strength intact in all four extremities, no gross restriction of joint movements Skin:  no rashes, no hyperemia Neurological: no tremor with outstretched hands  Diabetic Foot Exam - Simple   No data filed     CMP     Component Value Date/Time   NA 138 04/19/2023 1055   K 4.4 04/19/2023 1055   CL 103 04/19/2023 1055   CO2 22 04/19/2023 1055   GLUCOSE 131 (H) 04/19/2023 1055   GLUCOSE 113 (H) 10/23/2022 0242   BUN 17 04/19/2023 1055   CREATININE 1.44 (H) 04/19/2023 1055   CREATININE 1.47 (H) 08/25/2020 0927   CALCIUM 9.5 04/19/2023 1055   PROT 7.3 04/19/2023 1055   ALBUMIN 4.2 04/19/2023 1055   AST 10 04/19/2023 1055   ALT 14 04/19/2023 1055   ALKPHOS 93 04/19/2023 1055   BILITOT 0.5 04/19/2023 1055   GFRNONAA 40 (L) 10/23/2022 0242   GFRNONAA 50 (L) 08/25/2020 0927   GFRAA 61 12/10/2020 0933   GFRAA 58 (L) 08/25/2020 0927      Diabetic Labs (most recent): Lab Results  Component Value Date   HGBA1C 7.3 (A) 07/26/2023   HGBA1C 7.0 (A) 04/20/2023   HGBA1C 6.8 (A) 11/19/2022   MICROALBUR 150 03/25/2022   MICROALBUR 150 03/16/2021   MICROALBUR 188.1 (H) 01/07/2020     Lipid Panel     Component Value Date/Time   CHOL 100 04/19/2023 1055   TRIG 167 (H) 04/19/2023 1055   HDL 36 (L) 04/19/2023 1055   CHOLHDL 2.8 04/19/2023 1055   CHOLHDL 3.4 08/25/2020 0927   VLDL 39 (H) 05/14/2017 0831   LDLCALC 36 04/19/2023 1055   LDLCALC 55 08/25/2020 0927   LABVLDL 28 04/19/2023 1055      Lab Results  Component Value Date   TSH 1.300 03/04/2022   TSH 2.215 07/22/2021   TSH 2.100 06/10/2021   TSH 2.79 08/25/2020   TSH 1.96 08/24/2019   TSH 1.34 01/31/2018   TSH 1.61 02/10/2016   FREET4 1.14 06/10/2021   FREET4 1.0 08/25/2020     Assessment & Plan:   1) Type 2 diabetes mellitus with stage 3a chronic kidney disease, with long-term current use of insulin (HCC)  - Constantino Anne has currently uncontrolled symptomatic type 2 DM since 67 years of age.  He presents today with his meter and CGM showing at slightly above target glycemic profile overall.  His POCT A1c today is 7.3%, increasing slightly from last visit of 7%.  Analysis of his CGM shows TIR 41%, TAR 59%, TBR 0%, with a GMI of 8%.  He denies any significant hypoglycemia.  He notes his diet has slipped in recent weeks, says he tends to eat his feelings and has been a bit more down lately.   Recent labs reviewed.  - I had a long discussion with him about the progressive nature of diabetes and the pathology behind its complications. -his diabetes is complicated by CKD and he remains at a high risk for more acute and chronic  complications which include CAD, CVA, CKD, retinopathy, and neuropathy. These are all discussed in detail with him.  - Nutritional counseling repeated at each appointment due to patients tendency to fall back in to old habits.  -  The patient admits there is a room for improvement in their diet and drink choices. -  Suggestion is made for the patient to avoid simple carbohydrates from their diet including Cakes, Sweet Desserts / Pastries, Ice Cream, Soda (diet and regular), Sweet Tea, Candies, Chips, Cookies, Sweet Pastries, Store Bought Juices, Alcohol in Excess of 1-2 drinks a day, Artificial Sweeteners, Coffee Creamer, and "Sugar-free" Products. This will help patient to have stable blood glucose profile and potentially avoid unintended weight gain.   - I encouraged the patient to switch to unprocessed or minimally processed complex starch and increased protein intake (animal or plant source), fruits, and vegetables.   - Patient is advised to stick to a routine mealtimes to eat 3 meals a day and avoid unnecessary snacks (to snack only to correct hypoglycemia).  - I have approached him with the following individualized plan to manage his diabetes and patient agrees:   - he will continue to benefit from basal insulin.    -He is advised to increase his Ozempic to 2 mg SQ weekly and continue his Lantus 20 units SQ nightly.   -He is encouraged to continue monitoring blood glucose twice daily (using his CGM), before breakfast and before bed, and to call the clinic if he has readings less than 70 or greater than 200 for 3 tests in a row.   - he is warned not to take insulin without proper monitoring per orders.  -He reports that he had developed unexplained cough while taking Glipizide and Metformin.  After he finished his Glipizide and Metformin, reportedly the cough has improved.  He is advised to stay off of Glipizide and Metformin for now.   - Specific targets for  A1c;  LDL, HDL,  and Triglycerides were discussed with the patient.  2) Blood Pressure /Hypertension:  His blood pressure is at target today.  he is advised to continue his current medications including Amlodipine/Olmesartan 10-20 mg p.o. daily.  3)  Lipids/Hyperlipidemia:   His most recent lipid panel from 04/19/23 shows controlled LDL of 36 and elevated triglycerides of 167.  He is advised to continue Crestor 5 mg po every other day at bedtime and Fenofibrate 48 mg po daily.  Side effects and precautions discussed with him.   4)  Weight/Diet:  His Body mass index is 30.93 kg/m.  -   clearly complicating his diabetes care.   he is a candidate for weight loss. I discussed with him the fact that loss of 5 - 10% of his  current body weight will have the most impact on his diabetes management.  Exercise, and detailed carbohydrates information provided  -  detailed on discharge instructions.  5) Chronic Care/Health Maintenance: -he is on a Statin medications and is encouraged to initiate and continue to follow up with Ophthalmology, Dentist, Podiatrist at least yearly or according to recommendations, and advised to stay away from smoking. I have recommended yearly flu vaccine and pneumonia vaccine at least every 5 years; moderate intensity exercise for up to 150 minutes weekly; and  sleep for at least 7 hours a day.  - he is advised to maintain close follow up with Kerri Perches, MD for primary care needs, as well as his other providers for optimal and coordinated care.  I spent  48  minutes in the care of the patient today including review of labs from CMP, Lipids, Thyroid Function, Hematology (current and previous including abstractions from other facilities); face-to-face time discussing  his blood glucose readings/logs, discussing hypoglycemia and hyperglycemia episodes and symptoms, medications doses, his options of short and long term treatment based on the latest standards of care / guidelines;  discussion about incorporating lifestyle medicine;  and documenting the encounter. Risk reduction counseling performed per USPSTF guidelines to reduce obesity and cardiovascular risk factors.     Please refer to Patient Instructions for  Blood Glucose Monitoring and Insulin/Medications Dosing Guide"  in media tab for additional information. Please  also refer to " Patient Self Inventory" in the Media  tab for reviewed elements of pertinent patient history.  [name] participated in the discussions, expressed understanding, and voiced agreement with the above plans.  All questions were answered to his satisfaction. he is encouraged to contact clinic should he have any questions or concerns prior to his return visit.   Follow up plan: - Return in about 4 months (around 11/25/2023) for Diabetes F/U with A1c in office, No previsit labs, Bring meter and logs.  Ronny Bacon, [facility] [address] [place], [place] [address] Phone: [phone] Fax: [phone]  07/26/2023, 8:55 AM

## 2023-07-27 ENCOUNTER — Ambulatory Visit: Payer: Medicare HMO | Admitting: Physician Assistant

## 2023-08-06 ENCOUNTER — Other Ambulatory Visit: Payer: Self-pay | Admitting: Family Medicine

## 2023-08-06 DIAGNOSIS — R062 Wheezing: Secondary | ICD-10-CM

## 2023-08-06 DIAGNOSIS — J302 Other seasonal allergic rhinitis: Secondary | ICD-10-CM

## 2023-08-08 ENCOUNTER — Other Ambulatory Visit: Payer: Self-pay

## 2023-08-08 ENCOUNTER — Other Ambulatory Visit (HOSPITAL_COMMUNITY): Payer: Self-pay

## 2023-08-08 MED ORDER — ALBUTEROL SULFATE HFA 108 (90 BASE) MCG/ACT IN AERS
2.0000 | INHALATION_SPRAY | Freq: Every day | RESPIRATORY_TRACT | 0 refills | Status: DC | PRN
  Filled 2023-08-08: qty 13.4, 30d supply, fill #0

## 2023-08-09 ENCOUNTER — Telehealth: Payer: Self-pay | Admitting: Family Medicine

## 2023-08-09 ENCOUNTER — Other Ambulatory Visit: Payer: Self-pay

## 2023-08-09 MED ORDER — BUSPIRONE HCL 5 MG PO TABS: 5.0000 mg | ORAL_TABLET | Freq: Three times a day (TID) | ORAL | 5 refills | Status: DC

## 2023-08-09 NOTE — Telephone Encounter (Signed)
Refill sent.

## 2023-08-09 NOTE — Telephone Encounter (Signed)
Patient calling needing a refill on busPIRone (BUSPAR) 5 MG tablet [[id number]] Please advise [facility] in  [place]- pt says make sure it is 3 times a day instead of two as discussed. Thank you

## 2023-08-10 ENCOUNTER — Other Ambulatory Visit (HOSPITAL_COMMUNITY): Payer: Self-pay

## 2023-08-11 ENCOUNTER — Other Ambulatory Visit (HOSPITAL_COMMUNITY): Payer: Self-pay

## 2023-08-13 ENCOUNTER — Other Ambulatory Visit: Payer: Self-pay | Admitting: Family Medicine

## 2023-08-15 ENCOUNTER — Telehealth: Payer: Self-pay | Admitting: Nurse Practitioner

## 2023-08-15 ENCOUNTER — Other Ambulatory Visit: Payer: Self-pay | Admitting: Nurse Practitioner

## 2023-08-15 NOTE — Telephone Encounter (Signed)
This was given to the patient.

## 2023-08-21 ENCOUNTER — Other Ambulatory Visit: Payer: Self-pay | Admitting: Family Medicine

## 2023-08-23 ENCOUNTER — Other Ambulatory Visit: Payer: Self-pay | Admitting: Family Medicine

## 2023-08-24 ENCOUNTER — Inpatient Hospital Stay: Payer: Medicare HMO | Attending: Hematology

## 2023-08-24 DIAGNOSIS — Z87891 Personal history of nicotine dependence: Secondary | ICD-10-CM | POA: Insufficient documentation

## 2023-08-24 DIAGNOSIS — D72821 Monocytosis (symptomatic): Secondary | ICD-10-CM | POA: Diagnosis not present

## 2023-08-24 DIAGNOSIS — D539 Nutritional anemia, unspecified: Secondary | ICD-10-CM | POA: Insufficient documentation

## 2023-08-24 DIAGNOSIS — N189 Chronic kidney disease, unspecified: Secondary | ICD-10-CM | POA: Insufficient documentation

## 2023-08-24 DIAGNOSIS — E538 Deficiency of other specified B group vitamins: Secondary | ICD-10-CM | POA: Insufficient documentation

## 2023-08-24 LAB — CBC WITH DIFFERENTIAL/PLATELET
Abs Immature Granulocytes: 0.05 10*3/uL (ref 0.00–0.07)
Basophils Absolute: 0.1 10*3/uL (ref 0.0–0.1)
Basophils Relative: 1 %
Eosinophils Absolute: 0.4 10*3/uL (ref 0.0–0.5)
Eosinophils Relative: 4 %
HCT: 43.1 % (ref 39.0–52.0)
Hemoglobin: 13.9 g/dL (ref 13.0–17.0)
Immature Granulocytes: 1 %
Lymphocytes Relative: 28 %
Lymphs Abs: 2.8 10*3/uL (ref 0.7–4.0)
MCH: 30.5 pg (ref 26.0–34.0)
MCHC: 32.3 g/dL (ref 30.0–36.0)
MCV: 94.7 fL (ref 80.0–100.0)
Monocytes Absolute: 1 10*3/uL (ref 0.1–1.0)
Monocytes Relative: 10 %
Neutro Abs: 5.9 10*3/uL (ref 1.7–7.7)
Neutrophils Relative %: 56 %
Platelets: 228 10*3/uL (ref 150–400)
RBC: 4.55 MIL/uL (ref 4.22–5.81)
RDW: 14.4 % (ref 11.5–15.5)
WBC: 10.1 10*3/uL (ref 4.0–10.5)
nRBC: 0 % (ref 0.0–0.2)

## 2023-08-24 LAB — VITAMIN B12: Vitamin B-12: 966 pg/mL — ABNORMAL HIGH (ref 180–914)

## 2023-08-24 LAB — LACTATE DEHYDROGENASE: LDH: 120 U/L (ref 98–192)

## 2023-08-27 DIAGNOSIS — E1122 Type 2 diabetes mellitus with diabetic chronic kidney disease: Secondary | ICD-10-CM | POA: Diagnosis not present

## 2023-08-27 DIAGNOSIS — E1165 Type 2 diabetes mellitus with hyperglycemia: Secondary | ICD-10-CM | POA: Diagnosis not present

## 2023-08-30 NOTE — Progress Notes (Unsigned)
Norman Peters [address], [place] [address]   CLINIC:  Medical Oncology/Hematology  PCP:  Norman Perches, MD [address] [place] [address] [phone]   REASON FOR VISIT:  Follow-up for macrocytic anemia secondary to B12 deficiency  CURRENT THERAPY: B12 1000 mcg daily  INTERVAL HISTORY:   Norman Peters 67 y.o. male returns for routine follow-up of his macrocytic anemia secondary to B12 deficiency.  He was last seen by Norman Peters on 08/23/2022.  At today's visit, he reports feeling well.  No recent hospitalizations, surgeries, or changes in baseline health status.  He continues to take vitamin B12 1000 mcg daily.  No fatigue, fever, chills, shortness of breath, cough, chest pain, nausea, vomiting, abdominal pain.  Denies any signs or symptoms of blood loss.  No current signs or symptoms of blood clots.  He has 90% energy and 100% appetite. He endorses that he is maintaining a stable weight.  ASSESSMENT & PLAN:  1.  Severe macrocytic anemia secondary to vitamin B12 deficiency: - Severe macrocytic anemia on 07/10/2021 with hemoglobin 6.9.  He required 2 units PRBC. - Vitamin B12 on 07/12/2021 was found to be severely low at <50 - Intrinsic factor and parietal cell antibodies were highly suggestive of pernicious anemia.  - Colonoscopy on 07/07/2016 with 5 mm rectosigmoid hyperplastic polyp removed. - CTAP on 03/07/2021 with spleen size normal. - Has occasional blood on tissue when constipated, most likely from external hemorrhoids.  Denies any fevers, night sweats or weight loss. - Intrinsic factor and parietal cell antibodies were high suggestive of pernicious anemia.  - He is taking B12 1 mg tablet daily. - Most recent labs (08/24/2023): Normal Hgb 13.9, vitamin B12 966, normal LDH - PLAN: Macrocytic anemia secondary to severe vitamin B12 deficiency.  B12 and hemoglobin levels currently normal. - Continue B12 supplementation.  Repeat labs and  RTC in 1 year.  Consider discharge from clinic at that time if stable.  2.  Monocytosis - Have discussed with patient that his white count has been intermittently elevated for the past few years.  Differential predominantly monocytes with occasional elevations in lymphocytes.  He does not have any other cytopenias. - Most recent labs (08/24/2023): WBC 10.1 with differential. - PLAN: Continue surveillance.  If any significant changes, would consider bone marrow biopsy.  3.  CKD: - He has CKD since 2017.  4.  Social/family history: - He lives at home with his wife.  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  No exposure to chemicals.  He smoked 1 pack/day for 40 years.  Quit smoking in 2012. - No family history of cancers or leukemias  PLAN SUMMARY: >> Labs in 1 year = CBC/D, B12, MMA, LDH >> OFFICE visit in 1 year (1 week after labs)     REVIEW OF SYSTEMS: No acute complaints at today's visit.  Review of Systems  Constitutional:  Negative for appetite change, chills, diaphoresis, fatigue, fever and unexpected weight change.  HENT:   Negative for lump/mass and nosebleeds.   Eyes:  Negative for eye problems.  Respiratory:  Negative for cough, hemoptysis and shortness of breath.   Cardiovascular:  Negative for chest pain, leg swelling and palpitations.  Gastrointestinal:  Negative for abdominal pain, blood in stool, constipation, diarrhea, nausea and vomiting.  Genitourinary:  Negative for hematuria.   Skin: Negative.   Neurological:  Negative for  dizziness, headaches and light-headedness.  Hematological:  Does not bruise/bleed easily.     PHYSICAL EXAM:  ECOG PERFORMANCE STATUS: 0 - Asymptomatic  Vitals:   08/31/23 0809  BP: 125/73  Pulse: 93  Resp: 18  Temp: 98.3 F (36.8 C)  SpO2: 94%   Filed Weights   08/31/23 0809  Weight: 202 lb 6.4 oz (91.8 kg)   Physical Exam Constitutional:       Appearance: Normal appearance. He is obese.  Cardiovascular:     Heart sounds: Normal heart sounds.  Pulmonary:     Breath sounds: Normal breath sounds.  Neurological:     General: No focal deficit present.     Mental Status: Mental status is at baseline.  Psychiatric:        Behavior: Behavior normal. Behavior is cooperative.     PAST MEDICAL/SURGICAL HISTORY:  Past Medical History:  Diagnosis Date   Acute left-sided low back pain with left-sided sciatica 11/04/2020   Allergy    Anemia    Anxiety    Arthritis    Blood transfusion without reported diagnosis    Cataract    Chronic kidney disease    Depression 2001   Diabetes mellitus 2010   GERD (gastroesophageal reflux disease)    Hyperlipidemia    Hypertension 2005   Past Surgical History:  Procedure Laterality Date   COLON SURGERY     COLONOSCOPY N/A 07/07/2016   Procedure: COLONOSCOPY;  Surgeon: Norman Hippo, MD;  Location: [facility];  Service: Endoscopy;  Laterality: N/A;  1030   FINGER SURGERY Left 1974   fifth , trauma on job, had to replace/repair finger   HERNIA REPAIR  2006   x3, umbilical and bilateral hernia    SOCIAL HISTORY:  Social History   Socioeconomic History   Marital status: Married    Spouse name: Not on file   Number of children: Not on file   Years of education: Not on file   Highest education level: 11th grade  Occupational History   Not on file  Tobacco Use   Smoking status: Former    Current packs/day: 0.00    Average packs/day: 0.5 packs/day for 30.0 years (15.0 ttl pk-yrs)    Types: Cigarettes    Start date: 01/19/1981    Quit date: 01/20/2011    Years since quitting: 12.6    Passive exposure: Never   Smokeless tobacco: Never  Vaping Use   Vaping status: Never Used  Substance and Sexual Activity   Alcohol use: No   Drug use: No   Sexual activity: Not Currently  Other Topics Concern   Not on file  Social History Narrative   Not on file   Social Determinants of  Health   Financial Resource Strain: Low Risk  (04/27/2023)   Overall Financial Resource Strain (CARDIA)    Difficulty of Paying Living Expenses: Not hard at all  Food Insecurity: No Food Insecurity (04/27/2023)   Hunger Vital Sign    Worried About Running Out of Food in the Last Year: Never true    Ran Out of Food in the Last Year: Never true  Transportation Needs: Unmet Transportation Needs (04/27/2023)   PRAPARE - Transportation    Lack of Transportation (Medical): Yes    Lack of Transportation (Non-Medical): Yes  Physical Activity: Sufficiently Active (04/27/2023)   Exercise Vital Sign    Days of Exercise per Week: 5 days    Minutes of Exercise per Session: 60 min  Recent Concern: Physical  Activity - Insufficiently Active (04/27/2023)   Exercise Vital Sign    Days of Exercise per Week: 2 days    Minutes of Exercise per Session: 20 min  Stress: Stress Concern Present (04/27/2023)   [employer] of Occupational Health - Occupational Stress Questionnaire    Feeling of Stress : To some extent  Social Connections: Socially Integrated (04/27/2023)   Social Connection and Isolation Panel [NHANES]    Frequency of Communication with Friends and Family: Three times a week    Frequency of Social Gatherings with Friends and Family: Once a week    Attends Religious Services: More than 4 times per year    Active Member of [employer] or Organizations: Yes    Attends Engineer, structural: More than 4 times per year    Marital Status: Married  Catering manager Violence: Not At Risk (04/22/2022)   Humiliation, Afraid, Rape, and Kick questionnaire    Fear of Current or Ex-Partner: No    Emotionally Abused: No    Physically Abused: No    Sexually Abused: No    FAMILY HISTORY:  Family History  Problem Relation Age of Onset   Stroke Mother     CURRENT MEDICATIONS:  Outpatient Encounter Medications as of 08/31/2023  Medication Sig Note   acetaminophen (TYLENOL) 325 MG tablet Take 650 mg  by mouth every 6 (six) hours as needed.    albuterol (VENTOLIN HFA) 108 (90 Base) MCG/ACT inhaler Inhale 2 puffs into the lungs daily as needed, for wheezing and shortness of breath.    allopurinol (ZYLOPRIM) 300 MG tablet Take 1 tablet by mouth once daily    amLODipine-olmesartan (AZOR) 10-40 MG tablet Take 1 tablet by mouth daily.    aspirin EC 81 MG tablet Take 81 mg by mouth every morning.    busPIRone (BUSPAR) 5 MG tablet Take 1 tablet (5 mg total) by mouth 3 (three) times daily.    chlorthalidone (HYGROTON) 25 MG tablet Take 1 tablet by mouth once daily    cyanocobalamin 1000 MCG tablet Take 1,000 mcg by mouth daily.    FLUoxetine (PROZAC) 40 MG capsule TAKE 1 CAPSULE EVERY DAY    Insulin Pen Needle (PEN NEEDLES) 32G X 4 MM MISC Use to inject insulin once daily    LANTUS SOLOSTAR 100 UNIT/ML Solostar Pen INJECT 45 UNITS UNDER THE SKIN EVERY MORNING (DOSE INCREASE) 08/31/2023: Patient reports decrease to 10 units   rosuvastatin (CRESTOR) 5 MG tablet TAKE 1 TABLET EVERY MONDAY, WEDNESDAY AND FRIDAY (DISCONTINUE PRAVASTATIN)    Semaglutide, 2 MG/DOSE, 8 MG/3ML SOPN Inject 2 mg as directed once a week.    TRUE METRIX BLOOD GLUCOSE TEST test strip TEST BLOOD SUGAR ONE TIME DAILY AS DIRECTED    cephALEXin (KEFLEX) 500 MG capsule Take 1 capsule (500 mg total) by mouth 3 (three) times daily. (Patient not taking: Reported on 07/26/2023)    hydroxychloroquine (PLAQUENIL) 200 MG tablet Take 1 tablet (200 mg total) by mouth daily. (Patient not taking: Reported on 07/05/2023)    methocarbamol (ROBAXIN) 750 MG tablet Take 1 tablet (750 mg total) by mouth 4 (four) times daily. (Patient not taking: Reported on 08/31/2023)    No facility-administered encounter medications on file as of 08/31/2023.    ALLERGIES:  Allergies  Allergen Reactions   Ibuprofen Rash   Lisinopril Cough   Strawberry [Berry] Hives and Swelling    TONGUE SWELLING    LABORATORY DATA:  I have reviewed the labs as listed.  CBC  Component Value Date/Time   WBC 10.1 08/24/2023 0840   RBC 4.55 08/24/2023 0840   HGB 13.9 08/24/2023 0840   HGB 14.6 02/23/2023 0916   HCT 43.1 08/24/2023 0840   HCT 44.9 02/23/2023 0916   PLT 228 08/24/2023 0840   PLT 288 02/23/2023 0916   MCV 94.7 08/24/2023 0840   MCV 89 02/23/2023 0916   MCH 30.5 08/24/2023 0840   MCHC 32.3 08/24/2023 0840   RDW 14.4 08/24/2023 0840   RDW 13.6 02/23/2023 0916   LYMPHSABS 2.8 08/24/2023 0840   LYMPHSABS 3.4 (H) 02/23/2023 0916   MONOABS 1.0 08/24/2023 0840   EOSABS 0.4 08/24/2023 0840   EOSABS 0.3 02/23/2023 0916   BASOSABS 0.1 08/24/2023 0840   BASOSABS 0.1 02/23/2023 0916      Latest Ref Rng & Units 04/19/2023   10:55 AM 02/23/2023    9:16 AM 10/23/2022    2:42 AM  CMP  Glucose 70 - 99 mg/dL 191  86  478   BUN 8 - 27 mg/dL 17  14  22    Creatinine 0.76 - 1.27 mg/dL 2.95  6.21  3.08   Sodium 134 - 144 mmol/L 138  142  135   Potassium 3.5 - 5.2 mmol/L 4.4  5.0  4.1   Chloride 96 - 106 mmol/L 103  104  101   CO2 20 - 29 mmol/L 22  22  27    Calcium 8.6 - 10.2 mg/dL 9.5  9.6  9.4   Total Protein 6.0 - 8.5 g/dL 7.3  7.3    Total Bilirubin 0.0 - 1.2 mg/dL 0.5  0.4    Alkaline Phos 44 - 121 IU/L 93  95    AST 0 - 40 IU/L 10  18    ALT 0 - 44 IU/L 14  24      DIAGNOSTIC IMAGING:  I have independently reviewed the relevant imaging and discussed with the patient.   WRAP UP:  All questions were answered. The patient knows to call the clinic with any problems, questions or concerns.  Medical decision making: Low  Time spent on visit: I spent 15 minutes counseling the patient face to face. The total time spent in the appointment was 22 minutes and more than 50% was on counseling.  Carnella Guadalajara, PA-C  08/31/23 8:31 AM

## 2023-08-31 ENCOUNTER — Other Ambulatory Visit: Payer: Self-pay

## 2023-08-31 ENCOUNTER — Encounter: Payer: Self-pay | Admitting: Physician Assistant

## 2023-08-31 ENCOUNTER — Inpatient Hospital Stay: Payer: Medicare HMO | Admitting: Physician Assistant

## 2023-08-31 VITALS — BP 125/73 | HR 93 | Temp 98.3°F | Resp 18 | Wt 202.4 lb

## 2023-08-31 DIAGNOSIS — N189 Chronic kidney disease, unspecified: Secondary | ICD-10-CM | POA: Diagnosis not present

## 2023-08-31 DIAGNOSIS — Z87891 Personal history of nicotine dependence: Secondary | ICD-10-CM | POA: Diagnosis not present

## 2023-08-31 DIAGNOSIS — D518 Other vitamin B12 deficiency anemias: Secondary | ICD-10-CM

## 2023-08-31 DIAGNOSIS — D539 Nutritional anemia, unspecified: Secondary | ICD-10-CM | POA: Diagnosis not present

## 2023-08-31 DIAGNOSIS — E538 Deficiency of other specified B group vitamins: Secondary | ICD-10-CM | POA: Diagnosis not present

## 2023-08-31 DIAGNOSIS — D72821 Monocytosis (symptomatic): Secondary | ICD-10-CM | POA: Diagnosis not present

## 2023-08-31 NOTE — Patient Instructions (Signed)
[facility] at [facility] **VISIT SUMMARY & IMPORTANT INSTRUCTIONS **   You were seen today by Rojelio Brenner PA-C for your follow-up visit.   We are monitoring and managing your anemia related to vitamin B12 deficiency. Your most recent labs show normal blood counts and vitamin B12 levels. Continue to take your vitamin B12 supplement (1000 mcg) once daily.  FOLLOW-UP APPOINTMENT: We will recheck labs and see you for an office visit in 1 year.  ** Thank you for trusting me with your healthcare!  I strive to provide all of my patients with quality care at each visit.  If you receive a survey for this visit, I would be so grateful to you for taking the time to provide feedback.  Thank you in advance!  ~ Ellysa Parrack                   Dr. Doreatha Massed   &   Rojelio Brenner, PA-C   - - - - - - - - - - - - - - - - - -    Thank you for choosing [facility] at [facility] to provide your oncology and hematology care.  To afford each patient quality time with our provider, please arrive at least 15 minutes before your scheduled appointment time.   If you have a lab appointment with the Cancer Center please come in thru the Main Entrance and check in at the main information desk.  You need to re-schedule your appointment should you arrive 10 or more minutes late.  We strive to give you quality time with our providers, and arriving late affects you and other patients whose appointments are after yours.  Also, if you no show three or more times for appointments you may be dismissed from the clinic at the providers discretion.     Again, thank you for choosing [facility].  Our hope is that these requests will decrease the amount of time that you wait before being seen by our physicians.       _____________________________________________________________  Should you have questions after your visit to [facility], please  contact our office at [phone] and follow the prompts.  Our office hours are 8:00 a.m. and 4:30 p.m. Monday - Friday.  Please note that voicemails left after 4:00 p.m. may not be returned until the following business day.  We are closed weekends and major holidays.  You do have access to a nurse 24-7, just call the main number to the clinic [phone] and do not press any options, hold on the line and a nurse will answer the phone.    For prescription refill requests, have your pharmacy contact our office and allow 72 hours.

## 2023-09-01 ENCOUNTER — Other Ambulatory Visit: Payer: Self-pay | Admitting: Family Medicine

## 2023-10-11 ENCOUNTER — Other Ambulatory Visit: Payer: Self-pay | Admitting: Family Medicine

## 2023-11-03 ENCOUNTER — Ambulatory Visit (INDEPENDENT_AMBULATORY_CARE_PROVIDER_SITE_OTHER): Payer: Medicare HMO | Admitting: Family Medicine

## 2023-11-03 VITALS — BP 128/72 | HR 88 | Ht 68.0 in | Wt 203.0 lb

## 2023-11-03 DIAGNOSIS — E559 Vitamin D deficiency, unspecified: Secondary | ICD-10-CM | POA: Diagnosis not present

## 2023-11-03 DIAGNOSIS — Z125 Encounter for screening for malignant neoplasm of prostate: Secondary | ICD-10-CM | POA: Diagnosis not present

## 2023-11-03 DIAGNOSIS — E785 Hyperlipidemia, unspecified: Secondary | ICD-10-CM | POA: Diagnosis not present

## 2023-11-03 DIAGNOSIS — F419 Anxiety disorder, unspecified: Secondary | ICD-10-CM | POA: Diagnosis not present

## 2023-11-03 DIAGNOSIS — B359 Dermatophytosis, unspecified: Secondary | ICD-10-CM

## 2023-11-03 DIAGNOSIS — F32A Depression, unspecified: Secondary | ICD-10-CM

## 2023-11-03 DIAGNOSIS — I10 Essential (primary) hypertension: Secondary | ICD-10-CM | POA: Diagnosis not present

## 2023-11-03 DIAGNOSIS — M2550 Pain in unspecified joint: Secondary | ICD-10-CM

## 2023-11-03 DIAGNOSIS — E1140 Type 2 diabetes mellitus with diabetic neuropathy, unspecified: Secondary | ICD-10-CM | POA: Diagnosis not present

## 2023-11-03 MED ORDER — CLOTRIMAZOLE-BETAMETHASONE 1-0.05 % EX CREA: 1.0000 | TOPICAL_CREAM | Freq: Two times a day (BID) | CUTANEOUS | 0 refills | Status: DC

## 2023-11-03 MED ORDER — TERBINAFINE HCL 250 MG PO TABS: 250.0000 mg | ORAL_TABLET | Freq: Every day | ORAL | 0 refills | Status: DC

## 2023-11-03 NOTE — Patient Instructions (Signed)
Annual exam in January as already scheduled please keep that appointment.  Labs today lipid CMP and EGFR PSA TSH and vitamin D.  Medication prescribed for rash on left leg.   Terbinafine tablets 1 daily for 3 weeks and topical antifungal /steroid cream to be applied twice daily to the area around the rash.  Do not traumatize and scratch the skin although it may feel itchy.  Nurse please send for COVID-vaccine and document states he got this at [facility] probably in  November.  Nurse please send for his eye exam which she had in [place] again sometime in the past 3 to 4 months.

## 2023-11-04 ENCOUNTER — Encounter: Payer: Self-pay | Admitting: Family Medicine

## 2023-11-04 LAB — VITAMIN D 25 HYDROXY (VIT D DEFICIENCY, FRACTURES): Vit D, 25-Hydroxy: 26.7 ng/mL — ABNORMAL LOW (ref 30.0–100.0)

## 2023-11-04 LAB — CMP14+EGFR
ALT: 19 [IU]/L (ref 0–44)
AST: 15 [IU]/L (ref 0–40)
Albumin: 4.3 g/dL (ref 3.9–4.9)
Alkaline Phosphatase: 107 [IU]/L (ref 44–121)
BUN/Creatinine Ratio: 13 (ref 10–24)
BUN: 24 mg/dL (ref 8–27)
Bilirubin Total: 0.5 mg/dL (ref 0.0–1.2)
CO2: 22 mmol/L (ref 20–29)
Calcium: 9.6 mg/dL (ref 8.6–10.2)
Chloride: 102 mmol/L (ref 96–106)
Creatinine, Ser: 1.81 mg/dL — ABNORMAL HIGH (ref 0.76–1.27)
Globulin, Total: 2.5 g/dL (ref 1.5–4.5)
Glucose: 116 mg/dL — ABNORMAL HIGH (ref 70–99)
Potassium: 4.6 mmol/L (ref 3.5–5.2)
Sodium: 138 mmol/L (ref 134–144)
Total Protein: 6.8 g/dL (ref 6.0–8.5)
eGFR: 40 mL/min/{1.73_m2} — ABNORMAL LOW (ref >59)

## 2023-11-04 LAB — LIPID PANEL
Chol/HDL Ratio: 2.8 {ratio} (ref 0.0–5.0)
Cholesterol, Total: 105 mg/dL (ref 100–199)
HDL: 37 mg/dL — ABNORMAL LOW (ref >39)
LDL Chol Calc (NIH): 38 mg/dL (ref 0–99)
Triglycerides: 182 mg/dL — ABNORMAL HIGH (ref 0–149)
VLDL Cholesterol Cal: 30 mg/dL (ref 5–40)

## 2023-11-04 LAB — PSA: Prostate Specific Ag, Serum: 2.4 ng/mL (ref 0.0–4.0)

## 2023-11-04 LAB — TSH: TSH: 2.15 u[IU]/mL (ref 0.450–4.500)

## 2023-11-06 DIAGNOSIS — B359 Dermatophytosis, unspecified: Secondary | ICD-10-CM | POA: Insufficient documentation

## 2023-11-06 DIAGNOSIS — Z125 Encounter for screening for malignant neoplasm of prostate: Secondary | ICD-10-CM | POA: Insufficient documentation

## 2023-11-06 NOTE — Assessment & Plan Note (Signed)
Controlled, no change in medication Marked improvement over the past 5 years  aas he has settled into his new home

## 2023-11-06 NOTE — Assessment & Plan Note (Signed)
Updated lab on day pof visit

## 2023-11-06 NOTE — Assessment & Plan Note (Signed)
Hyperlipidemia:Low fat diet discussed and encouraged.   Lipid Panel  Lab Results  Component Value Date   CHOL 105 11/03/2023   HDL 37 (L) 11/03/2023   LDLCALC 38 11/03/2023   TRIG 182 (H) 11/03/2023   CHOLHDL 2.8 11/03/2023     Needs to reduce fat in diet

## 2023-11-06 NOTE — Assessment & Plan Note (Signed)
Has opted not to take DMD afraid of complications

## 2023-11-06 NOTE — Assessment & Plan Note (Signed)
Terbinafine x 3 weeks and topical antifungal, clotrimazole/betameth

## 2023-11-06 NOTE — Assessment & Plan Note (Signed)
PSA ordered

## 2023-11-06 NOTE — Assessment & Plan Note (Signed)
Controlled, no change in medication DASH diet and commitment to daily physical activity for a minimum of 30 minutes discussed and encouraged, as a part of hypertension management. The importance of attaining a healthy weight is also discussed.     11/03/2023    9:51 AM 11/03/2023    9:21 AM 08/31/2023    8:09 AM 07/26/2023    8:23 AM 07/05/2023    2:48 PM 05/11/2023    8:31 AM 04/25/2023    1:18 PM  BP/Weight  Systolic BP 128 134 125 112 109 118 138  Diastolic BP 72 77 73 71 73 73 81  Wt. (Lbs)  203 202.4 203.4 200 202.12   BMI  30.87 kg/m2 30.77 kg/m2 30.93 kg/m2 30.41 kg/m2 30.73 kg/m2

## 2023-11-06 NOTE — Progress Notes (Signed)
Norman Peters     MRN: [id number]      DOB: 10-24-1956  Chief Complaint  Patient presents with   Follow-up    Bruise on left leg x 1 year gotten bigger     HPI Norman Peters is here for follow up and re-evaluation of chronic medical conditions, medication management and review of any available recent lab and radiology data.  Preventive health is updated, specifically  Cancer screening and Immunization.   Questions or concerns regarding consultations or procedures which the PT has had in the interim are  addressed. The PT denies any adverse reactions to current medications since the last visit.  Denies polyuria, polydipsia, blurred vision , or hypoglycemic episodes. C/o itchy rash o lef leg increasing in size x 1 year, no pus or redness. Denies polyuria, polydipsia, blurred vision , or hypoglycemic episodes.  ROS Denies recent fever or chills. Denies sinus pressure, nasal congestion, ear pain or sore throat. Denies chest congestion, productive cough or wheezing. Denies chest pains, palpitations and leg swelling Denies abdominal pain, nausea, vomiting,diarrhea or constipation.   Denies dysuria, frequency, hesitancy or incontinence. Denies uncontrolled  joint pain, swelling and limitation in mobility. Denies headaches, seizures, numbness, or tingling. Denies depression, anxiety or insomnia.Feels well    PE  BP 128/72   Pulse 88   Ht 5\' 8"  (1.727 m)   Wt 203 lb (92.1 kg)   SpO2 93%   BMI 30.87 kg/m   Patient alert and oriented and in no cardiopulmonary distress.  HEENT: No facial asymmetry, EOMI,     Neck supple .  Chest: Clear to auscultation bilaterally.  CVS: S1, S2 no murmurs, no S3.Regular rate.  ABD: Soft non tender.   Ext: No edema  MS: Decreased  ROM spine, shoulders, hips and knees.  Skin: fungal rash noted.on left leg max diameter approx   Psych: Good eye contact, normal affect. Memory intact not anxious or depressed appearing.  CNS: CN 2-12 intact, power,   normal throughout.no focal deficits noted.   Assessment & Plan  Essential hypertension, benign Controlled, no change in medication DASH diet and commitment to daily physical activity for a minimum of 30 minutes discussed and encouraged, as a part of hypertension management. The importance of attaining a healthy weight is also discussed.     11/03/2023    9:51 AM 11/03/2023    9:21 AM 08/31/2023    8:09 AM 07/26/2023    8:23 AM 07/05/2023    2:48 PM 05/11/2023    8:31 AM 04/25/2023    1:18 PM  BP/Weight  Systolic BP 128 134 125 112 109 118 138  Diastolic BP 72 77 73 71 73 73 81  Wt. (Lbs)  203 202.4 203.4 200 202.12   BMI  30.87 kg/m2 30.77 kg/m2 30.93 kg/m2 30.41 kg/m2 30.73 kg/m2        Hyperlipidemia LDL goal <100 Hyperlipidemia:Low fat diet discussed and encouraged.   Lipid Panel  Lab Results  Component Value Date   CHOL 105 11/03/2023   HDL 37 (L) 11/03/2023   LDLCALC 38 11/03/2023   TRIG 182 (H) 11/03/2023   CHOLHDL 2.8 11/03/2023     Needs to reduce fat in diet  Special screening for malignant neoplasm of prostate PSA ordered  Type 2 diabetes mellitus with other specified complication (HCC) Diabetes associated with hypertension, hyperlipidemia, arthritis, and depression  Norman Peters is reminded of the importance of commitment to daily physical activity for 30 minutes or more, as able and the  need to limit carbohydrate intake to 30 to 60 grams per meal to help with blood sugar control.  Managed by Endo, anticipate improvement   The need to take medication as prescribed, test blood sugar as directed, and to call between visits if there is a concern that blood sugar is uncontrolled is also discussed.   Norman Peters is reminded of the importance of daily foot exam, annual eye examination, and good blood sugar, blood pressure and cholesterol control.     Latest Ref Rng & Units 11/03/2023    9:57 AM 07/26/2023    8:41 AM 04/22/2023    2:10 PM 04/20/2023    9:18 AM  04/19/2023   10:55 AM  Diabetic Labs  HbA1c 4.0 - 5.6 %  7.3   7.0    Micro/Creat Ratio 0 - 29 mg/g creat   303     Chol 100 - 199 mg/dL 098     119   HDL >14 mg/dL 37     36   Calc LDL 0 - 99 mg/dL 38     36   Triglycerides 0 - 149 mg/dL 782     956   Creatinine 0.76 - 1.27 mg/dL 2.13     0.86       57/84/6962    9:51 AM 11/03/2023    9:21 AM 08/31/2023    8:09 AM 07/26/2023    8:23 AM 07/05/2023    2:48 PM 05/11/2023    8:31 AM 04/25/2023    1:18 PM  BP/Weight  Systolic BP 128 134 125 112 109 118 138  Diastolic BP 72 77 73 71 73 73 81  Wt. (Lbs)  203 202.4 203.4 200 202.12   BMI  30.87 kg/m2 30.77 kg/m2 30.93 kg/m2 30.41 kg/m2 30.73 kg/m2       04/20/2023    9:30 AM 01/15/2022    1:00 PM  Foot/eye exam completion dates  Foot Form Completion Done Done        Vitamin D deficiency Updated lab on day pof visit   Pain in joint involving multiple sites Has opted not to take DMD afraid of complications  Anxiety and depression Controlled, no change in medication Marked improvement over the past 5 years  aas he has settled into his new home  Tinea Terbinafine x 3 weeks and topical antifungal, clotrimazole/betameth

## 2023-11-06 NOTE — Assessment & Plan Note (Addendum)
Diabetes associated with hypertension, hyperlipidemia, arthritis, and depression  Norman Peters is reminded of the importance of commitment to daily physical activity for 30 minutes or more, as able and the need to limit carbohydrate intake to 30 to 60 grams per meal to help with blood sugar control.  Managed by Endo, anticipate improvement   The need to take medication as prescribed, test blood sugar as directed, and to call between visits if there is a concern that blood sugar is uncontrolled is also discussed.   Norman Peters is reminded of the importance of daily foot exam, annual eye examination, and good blood sugar, blood pressure and cholesterol control.     Latest Ref Rng & Units 11/03/2023    9:57 AM 07/26/2023    8:41 AM 04/22/2023    2:10 PM 04/20/2023    9:18 AM 04/19/2023   10:55 AM  Diabetic Labs  HbA1c 4.0 - 5.6 %  7.3   7.0    Micro/Creat Ratio 0 - 29 mg/g creat   303     Chol 100 - 199 mg/dL 914     782   HDL >95 mg/dL 37     36   Calc LDL 0 - 99 mg/dL 38     36   Triglycerides 0 - 149 mg/dL 621     308   Creatinine 0.76 - 1.27 mg/dL 6.57     8.46       96/29/5284    9:51 AM 11/03/2023    9:21 AM 08/31/2023    8:09 AM 07/26/2023    8:23 AM 07/05/2023    2:48 PM 05/11/2023    8:31 AM 04/25/2023    1:18 PM  BP/Weight  Systolic BP 128 134 125 112 109 118 138  Diastolic BP 72 77 73 71 73 73 81  Wt. (Lbs)  203 202.4 203.4 200 202.12   BMI  30.87 kg/m2 30.77 kg/m2 30.93 kg/m2 30.41 kg/m2 30.73 kg/m2       04/20/2023    9:30 AM 01/15/2022    1:00 PM  Foot/eye exam completion dates  Foot Form Completion Done Done

## 2023-11-08 ENCOUNTER — Other Ambulatory Visit: Payer: Self-pay | Admitting: Family Medicine

## 2023-11-21 ENCOUNTER — Other Ambulatory Visit: Payer: Self-pay | Admitting: Family Medicine

## 2023-11-22 ENCOUNTER — Encounter (HOSPITAL_COMMUNITY): Payer: Self-pay | Admitting: Hematology

## 2023-11-22 ENCOUNTER — Ambulatory Visit (INDEPENDENT_AMBULATORY_CARE_PROVIDER_SITE_OTHER): Payer: Medicare HMO | Admitting: Family Medicine

## 2023-11-22 ENCOUNTER — Encounter: Payer: Self-pay | Admitting: Family Medicine

## 2023-11-22 VITALS — BP 120/74 | HR 97 | Ht 68.0 in | Wt 204.0 lb

## 2023-11-22 DIAGNOSIS — I10 Essential (primary) hypertension: Secondary | ICD-10-CM | POA: Diagnosis not present

## 2023-11-22 DIAGNOSIS — Z0001 Encounter for general adult medical examination with abnormal findings: Secondary | ICD-10-CM | POA: Diagnosis not present

## 2023-11-22 DIAGNOSIS — E790 Hyperuricemia without signs of inflammatory arthritis and tophaceous disease: Secondary | ICD-10-CM

## 2023-11-22 DIAGNOSIS — E1140 Type 2 diabetes mellitus with diabetic neuropathy, unspecified: Secondary | ICD-10-CM

## 2023-11-22 DIAGNOSIS — E559 Vitamin D deficiency, unspecified: Secondary | ICD-10-CM

## 2023-11-22 DIAGNOSIS — E785 Hyperlipidemia, unspecified: Secondary | ICD-10-CM | POA: Diagnosis not present

## 2023-11-22 DIAGNOSIS — E538 Deficiency of other specified B group vitamins: Secondary | ICD-10-CM

## 2023-11-22 MED ORDER — MONTELUKAST SODIUM 10 MG PO TABS: 10.0000 mg | ORAL_TABLET | Freq: Every day | ORAL | 5 refills | Status: DC

## 2023-11-22 NOTE — Patient Instructions (Addendum)
 Follow-up in end June , call if you need me sooner.  New medications in for allergies montelukast  1 tablet once daily.  You need a COVID booster you can get this at your pharmacy today.  Please reduce fried and fatty foods butter cheese egg yolks red meat, your triglycerides have gone up.  Fasting lipid CMP and EGFR , B12 level, and uric acid and urine ACR lto be drawn/ collected June 15 or after.  Please commit to 64 ounces of water  daily.  It is important that you exercise regularly at least 30 minutes 5 times a week. If you develop chest pain, have severe difficulty breathing, or feel very tired, stop exercising immediately and seek medical attention  Thanks for choosing [facility], we consider it a privelige to serve you.

## 2023-11-25 ENCOUNTER — Ambulatory Visit (INDEPENDENT_AMBULATORY_CARE_PROVIDER_SITE_OTHER): Payer: Medicare HMO | Admitting: Orthopedic Surgery

## 2023-11-25 ENCOUNTER — Ambulatory Visit (INDEPENDENT_AMBULATORY_CARE_PROVIDER_SITE_OTHER): Payer: Medicare HMO | Admitting: Nurse Practitioner

## 2023-11-25 ENCOUNTER — Encounter: Payer: Self-pay | Admitting: Nurse Practitioner

## 2023-11-25 ENCOUNTER — Encounter: Payer: Self-pay | Admitting: Orthopedic Surgery

## 2023-11-25 ENCOUNTER — Encounter (HOSPITAL_COMMUNITY): Payer: Self-pay | Admitting: Hematology

## 2023-11-25 VITALS — BP 115/72 | HR 84 | Ht 68.0 in | Wt 204.6 lb

## 2023-11-25 DIAGNOSIS — Z7985 Long-term (current) use of injectable non-insulin antidiabetic drugs: Secondary | ICD-10-CM

## 2023-11-25 DIAGNOSIS — N1832 Chronic kidney disease, stage 3b: Secondary | ICD-10-CM

## 2023-11-25 DIAGNOSIS — I10 Essential (primary) hypertension: Secondary | ICD-10-CM | POA: Diagnosis not present

## 2023-11-25 DIAGNOSIS — M1712 Unilateral primary osteoarthritis, left knee: Secondary | ICD-10-CM

## 2023-11-25 DIAGNOSIS — E1122 Type 2 diabetes mellitus with diabetic chronic kidney disease: Secondary | ICD-10-CM | POA: Diagnosis not present

## 2023-11-25 DIAGNOSIS — E782 Mixed hyperlipidemia: Secondary | ICD-10-CM

## 2023-11-25 DIAGNOSIS — Z794 Long term (current) use of insulin: Secondary | ICD-10-CM | POA: Diagnosis not present

## 2023-11-25 DIAGNOSIS — E1165 Type 2 diabetes mellitus with hyperglycemia: Secondary | ICD-10-CM | POA: Diagnosis not present

## 2023-11-25 DIAGNOSIS — M1711 Unilateral primary osteoarthritis, right knee: Secondary | ICD-10-CM

## 2023-11-25 DIAGNOSIS — M170 Bilateral primary osteoarthritis of knee: Secondary | ICD-10-CM

## 2023-11-25 LAB — POCT GLYCOSYLATED HEMOGLOBIN (HGB A1C): Hemoglobin A1C: 6.9 % — AB (ref 4.0–5.6)

## 2023-11-25 MED ORDER — SEMAGLUTIDE (2 MG/DOSE) 8 MG/3ML ~~LOC~~ SOPN: 2.0000 mg | PEN_INJECTOR | SUBCUTANEOUS | 1 refills | Status: DC

## 2023-11-25 MED ORDER — METHYLPREDNISOLONE ACETATE 40 MG/ML IJ SUSP
40.0000 mg | Freq: Once | INTRAMUSCULAR | Status: AC
  Administered 2023-11-25: 40 mg via INTRA_ARTICULAR

## 2023-11-25 MED ORDER — PEN NEEDLES 32G X 4 MM MISC: 6 refills | Status: AC

## 2023-11-25 MED ORDER — LANTUS SOLOSTAR 100 UNIT/ML ~~LOC~~ SOPN: 10.0000 [IU] | PEN_INJECTOR | Freq: Every day | SUBCUTANEOUS | 3 refills | Status: DC

## 2023-11-25 NOTE — Patient Instructions (Signed)
You have received an injection of steroids into the joint. 15% of patients will have increased pain within the 24 hours postinjection.   This is transient and will go away.   We recommend that you use ice packs on the injection site for 20 minutes every 2 hours and extra strength Tylenol 2 tablets every 8 as needed until the pain resolves.  If you continue to have pain after taking the Tylenol and using the ice please call the office for further instructions.  

## 2023-11-25 NOTE — Progress Notes (Signed)
Injections both knees

## 2023-11-25 NOTE — Progress Notes (Signed)
Patient ID: Norman Peters, male   DOB: 1956/09/17, 68 y.o.   MRN: [id number]  Encounter Diagnoses  Name Primary?   Primary osteoarthritis of right knee Yes   Primary osteoarthritis of left knee     Procedure note for bilateral knee injections  Procedure note left knee injection verbal consent was obtained to inject left knee joint  Timeout was completed to confirm the site of injection  The medications used were 40 mg depomedrol and 3 cc of 1% lidocaine  Anesthesia was provided by ethyl chloride and the skin was prepped with alcohol.  After cleaning the skin with alcohol a 20-gauge needle was used to inject the left knee joint. There were no complications. A sterile bandage was applied.   Procedure note right knee injection verbal consent was obtained to inject right knee joint  Timeout was completed to confirm the site of injection  The medications used were 40 mg depomedrol and 3 cc of 1% lidocaine  Anesthesia was provided by ethyl chloride and the skin was prepped with alcohol.  After cleaning the skin with alcohol a 20-gauge needle was used to inject the right knee joint. There were no complications. A sterile bandage was applied.

## 2023-11-25 NOTE — Progress Notes (Signed)
Endocrinology follow-up note    11/25/2023, 10:27 AM   Subjective:    Patient ID: Norman Peters, male    DOB: 1956-09-19.  Norman Peters is being seen in follow-up after he was seen in consultation for management of currently uncontrolled symptomatic diabetes requested by  Norman Perches, MD.   Past Medical History:  Diagnosis Date   Acute left-sided low back pain with left-sided sciatica 11/04/2020   Allergy    Anemia    Anxiety    Arthritis    Blood transfusion without reported diagnosis    Cataract    Chronic kidney disease    Depression 2001   Diabetes mellitus 2010   GERD (gastroesophageal reflux disease)    Hyperlipidemia    Hypertension 2005    Past Surgical History:  Procedure Laterality Date   COLON SURGERY     COLONOSCOPY N/A 07/07/2016   Procedure: COLONOSCOPY;  Surgeon: Norman Hippo, MD;  Location: [facility];  Service: Endoscopy;  Laterality: N/A;  1030   FINGER SURGERY Left 1974   fifth , trauma on job, had to replace/repair finger   HERNIA REPAIR  2006   x3, umbilical and bilateral hernia    Social History   Socioeconomic History   Marital status: Married    Spouse name: Not on file   Number of children: Not on file   Years of education: Not on file   Highest education level: 10th grade  Occupational History   Not on file  Tobacco Use   Smoking status: Former    Current packs/day: 0.00    Average packs/day: 0.5 packs/day for 30.0 years (15.0 ttl pk-yrs)    Types: Cigarettes    Start date: 01/19/1981    Quit date: 01/20/2011    Years since quitting: 12.8    Passive exposure: Never   Smokeless tobacco: Never  Vaping Use   Vaping status: Never Used  Substance and Sexual Activity   Alcohol use: No   Drug use: No   Sexual activity: Not Currently  Other Topics Concern   Not on file  Social History Narrative   Not on file   Social Drivers of Health    Financial Resource Strain: Low Risk  (11/03/2023)   Overall Financial Resource Strain (CARDIA)    Difficulty of Paying Living Expenses: Not hard at all  Food Insecurity: No Food Insecurity (11/03/2023)   Hunger Vital Sign    Worried About Running Out of Food in the Last Year: Never true    Ran Out of Food in the Last Year: Never true  Transportation Needs: No Transportation Needs (11/03/2023)   PRAPARE - Administrator, Civil Service (Medical): No    Lack of Transportation (Non-Medical): No  Physical Activity: Insufficiently Active (11/03/2023)   Exercise Vital Sign    Days of Exercise per Week: 3 days    Minutes of Exercise per Session: 10 min  Stress: Stress Concern Present (11/03/2023)   [employer] of Occupational Health - Occupational Stress Questionnaire    Feeling of Stress : To some extent  Social Connections: Socially Integrated (11/03/2023)  Social Connection and Isolation Panel [NHANES]    Frequency of Communication with Friends and Family: Three times a week    Frequency of Social Gatherings with Friends and Family: Once a week    Attends Religious Services: More than 4 times per year    Active Member of [employer] or Organizations: Yes    Attends Engineer, structural: More than 4 times per year    Marital Status: Married    Family History  Problem Relation Age of Onset   Stroke Mother     Outpatient Encounter Medications as of 11/25/2023  Medication Sig   acetaminophen (TYLENOL) 325 MG tablet Take 650 mg by mouth every 6 (six) hours as needed.   albuterol (VENTOLIN HFA) 108 (90 Base) MCG/ACT inhaler Inhale 2 puffs into the lungs daily as needed, for wheezing and shortness of breath.   allopurinol (ZYLOPRIM) 300 MG tablet Take 1 tablet by mouth once daily   amLODipine-olmesartan (AZOR) 10-40 MG tablet Take 1 tablet by mouth daily.   aspirin EC 81 MG tablet Take 81 mg by mouth every morning.   busPIRone (BUSPAR) 5 MG tablet Take 1 tablet (5  mg total) by mouth 3 (three) times daily.   chlorthalidone (HYGROTON) 25 MG tablet Take 1 tablet by mouth once daily   Cholecalciferol (VITAMIN D-3) 25 MCG (1000 UT) CAPS Take by mouth daily.   clotrimazole-betamethasone (LOTRISONE) cream Apply 1 Application topically 2 (two) times daily.   cyanocobalamin 1000 MCG tablet Take 1,000 mcg by mouth daily.   FLUoxetine (PROZAC) 40 MG capsule TAKE 1 CAPSULE EVERY DAY   montelukast (SINGULAIR) 10 MG tablet Take 1 tablet (10 mg total) by mouth at bedtime.   POTASSIUM PO Take by mouth daily.   rosuvastatin (CRESTOR) 5 MG tablet TAKE 1 TABLET EVERY MONDAY, WEDNESDAY AND FRIDAY (DISCONTINUE PRAVASTATIN)   TRUE METRIX BLOOD GLUCOSE TEST test strip TEST BLOOD SUGAR ONE TIME DAILY AS DIRECTED   [DISCONTINUED] Insulin Pen Needle (PEN NEEDLES) 32G X 4 MM MISC Use to inject insulin once daily   [DISCONTINUED] LANTUS SOLOSTAR 100 UNIT/ML Solostar Pen INJECT 45 UNITS UNDER THE SKIN EVERY MORNING (DOSE INCREASE)   [DISCONTINUED] Semaglutide, 2 MG/DOSE, 8 MG/3ML SOPN Inject 2 mg as directed once a week.   insulin glargine (LANTUS SOLOSTAR) 100 UNIT/ML Solostar Pen Inject 10 Units into the skin at bedtime.   Insulin Pen Needle (PEN NEEDLES) 32G X 4 MM MISC Use to inject insulin once daily   Semaglutide, 2 MG/DOSE, 8 MG/3ML SOPN Inject 2 mg as directed once a week.   No facility-administered encounter medications on file as of 11/25/2023.    ALLERGIES: Allergies  Allergen Reactions   Ibuprofen Rash   Lisinopril Cough   Strawberry [Berry] Hives and Swelling    TONGUE SWELLING    VACCINATION STATUS: Immunization History  Administered Date(s) Administered   Fluad Quad(high Dose 65+) 09/02/2020, 07/17/2021, 09/15/2022   Fluad Trivalent(High Dose 65+) 07/05/2023   Influenza,inj,Quad PF,6+ Mos 07/14/2016, 08/25/2017, 08/30/2018, 07/02/2019   MODERNA COVID-19 SARS-COV-2 PEDS BIVALENT BOOSTER 57yr-15yr 07/25/2023   Moderna Covid-19 Vaccine Bivalent Booster 13yrs  & up 10/01/2022   PFIZER(Purple Top)SARS-COV-2 Vaccination 01/25/2020, 02/19/2020, 09/28/2020, 05/29/2021, 09/25/2021   PNEUMOCOCCAL CONJUGATE-20 07/17/2021   Pfizer Covid-19 Vaccine Bivalent Booster 35yrs & up 09/25/2021   Pneumococcal Conjugate-13 10/08/2022   Pneumococcal Polysaccharide-23 02/10/2016   Rsv, Bivalent, Protein Subunit Rsvpref,pf Verdis Frederickson) 10/08/2022   Tdap 07/04/2021   Zoster Recombinant(Shingrix) 07/08/2021, 09/09/2021    Diabetes He presents for his follow-up  diabetic visit. He has type 2 diabetes mellitus. Onset time: He was diagnosed at approximate age of 48 years. His disease course has been improving. Pertinent negatives for hypoglycemia include no confusion, headaches, pallor or seizures. Associated symptoms include fatigue. Pertinent negatives for diabetes include no blurred vision, no chest pain, no polydipsia, no polyphagia, no polyuria, no weakness and no weight loss. There are no hypoglycemic complications. Pertinent negatives for hypoglycemia complications include no required assistance (went to ED for hypoglycemia since last visit). Symptoms are improving. Diabetic complications include nephropathy and peripheral neuropathy. Risk factors for coronary artery disease include diabetes mellitus, dyslipidemia, obesity, hypertension, male sex, tobacco exposure and sedentary lifestyle. Current diabetic treatment includes insulin injections (and Ozempic). He is compliant with treatment most of the time. His weight is decreasing steadily. He is following a generally healthy diet. When asked about meal planning, he reported none. He has had a previous visit with a dietitian. He rarely participates in exercise. His home blood glucose trend is decreasing steadily. His overall blood glucose range is 140-180 mg/dl. (He presents today with his CGM and logs, showing mostly at goal glycemic profile.  His POCT A1c today is 6.9%, improving from last visit of 7.2%.  He admits he did indulge  over the holidays but is getting back on track.  Analysis of his CGM shows TIR 81%, TAR 19%, TBR 0% with a GMI of 6.9%) An ACE inhibitor/angiotensin II receptor blocker is not being taken. He does not see a podiatrist.Eye exam is current.  Hyperlipidemia This is a chronic problem. The current episode started more than 1 year ago. The problem is uncontrolled. Recent lipid tests were reviewed and are variable. Exacerbating diseases include chronic renal disease, diabetes and obesity. Factors aggravating his hyperlipidemia include fatty foods. Pertinent negatives include no chest pain, myalgias or shortness of breath. Current antihyperlipidemic treatment includes statins. The current treatment provides mild improvement of lipids. Compliance problems include adherence to exercise and adherence to diet.  Risk factors for coronary artery disease include diabetes mellitus, dyslipidemia, male sex, obesity, hypertension and a sedentary lifestyle.  Hypertension This is a chronic problem. The current episode started more than 1 year ago. The problem has been resolved since onset. The problem is controlled. Pertinent negatives include no blurred vision, chest pain, headaches, neck pain, palpitations or shortness of breath. There are no associated agents to hypertension. Risk factors for coronary artery disease include diabetes mellitus, male gender, smoking/tobacco exposure, sedentary lifestyle, obesity and dyslipidemia. Past treatments include calcium channel blockers and diuretics. The current treatment provides mild improvement. There are no compliance problems.  Hypertensive end-organ damage includes kidney disease. Identifiable causes of hypertension include chronic renal disease.    Review of systems  Constitutional: + stable body weight,  current Body mass index is 31.11 kg/m. , no fatigue, no subjective hyperthermia, no subjective hypothermia Eyes: no blurry vision, no xerophthalmia ENT: no sore throat, no  nodules palpated in throat, no dysphagia/odynophagia, no hoarseness Cardiovascular: no chest pain, no shortness of breath, no palpitations, no leg swelling Respiratory: no cough, no shortness of breath Gastrointestinal: no nausea/vomiting/diarrhea Musculoskeletal: no muscle/joint aches Skin: no rashes, no hyperemia Neurological: no tremors, no numbness, no tingling, no dizziness Psychiatric: no depression, no anxiety   Objective:    BP 115/72 (BP Location: Right Arm, Patient Position: Sitting, Cuff Size: Large)   Pulse 84   Ht 5\' 8"  (1.727 m)   Wt 204 lb 9.6 oz (92.8 kg)   BMI 31.11 kg/m   Wt  Readings from Last 3 Encounters:  11/25/23 204 lb 9.6 oz (92.8 kg)  11/22/23 204 lb (92.5 kg)  11/03/23 203 lb (92.1 kg)    BP Readings from Last 3 Encounters:  11/25/23 115/72  11/22/23 120/74  11/03/23 128/72     Physical Exam- Limited  Constitutional:  Body mass index is 31.11 kg/m. , not in acute distress, normal state of mind Eyes:  EOMI, no exophthalmos Musculoskeletal: no gross deformities, strength intact in all four extremities, no gross restriction of joint movements Skin:  no rashes, no hyperemia Neurological: no tremor with outstretched hands  Diabetic Foot Exam - Simple   No data filed     CMP     Component Value Date/Time   NA 138 11/03/2023 0957   K 4.6 11/03/2023 0957   CL 102 11/03/2023 0957   CO2 22 11/03/2023 0957   GLUCOSE 116 (H) 11/03/2023 0957   GLUCOSE 113 (H) 10/23/2022 0242   BUN 24 11/03/2023 0957   CREATININE 1.81 (H) 11/03/2023 0957   CREATININE 1.47 (H) 08/25/2020 0927   CALCIUM 9.6 11/03/2023 0957   PROT 6.8 11/03/2023 0957   ALBUMIN 4.3 11/03/2023 0957   AST 15 11/03/2023 0957   ALT 19 11/03/2023 0957   ALKPHOS 107 11/03/2023 0957   BILITOT 0.5 11/03/2023 0957   GFRNONAA 40 (L) 10/23/2022 0242   GFRNONAA 50 (L) 08/25/2020 0927   GFRAA 61 12/10/2020 0933   GFRAA 58 (L) 08/25/2020 0927     Diabetic Labs (most recent): Lab  Results  Component Value Date   HGBA1C 6.9 (A) 11/25/2023   HGBA1C 7.3 (A) 07/26/2023   HGBA1C 7.0 (A) 04/20/2023   MICROALBUR 150 03/25/2022   MICROALBUR 150 03/16/2021   MICROALBUR 188.1 (H) 01/07/2020     Lipid Panel     Component Value Date/Time   CHOL 105 11/03/2023 0957   TRIG 182 (H) 11/03/2023 0957   HDL 37 (L) 11/03/2023 0957   CHOLHDL 2.8 11/03/2023 0957   CHOLHDL 3.4 08/25/2020 0927   VLDL 39 (H) 05/14/2017 0831   LDLCALC 38 11/03/2023 0957   LDLCALC 55 08/25/2020 0927   LABVLDL 30 11/03/2023 0957      Lab Results  Component Value Date   TSH 2.150 11/03/2023   TSH 1.300 03/04/2022   TSH 2.215 07/22/2021   TSH 2.100 06/10/2021   TSH 2.79 08/25/2020   TSH 1.96 08/24/2019   TSH 1.34 01/31/2018   TSH 1.61 02/10/2016   FREET4 1.14 06/10/2021   FREET4 1.0 08/25/2020     Assessment & Plan:   1) Type 2 diabetes mellitus with stage 3a chronic kidney disease, with long-term current use of insulin (HCC)  - Esau Wilkin has currently uncontrolled symptomatic type 2 DM since 68 years of age.  He presents today with his CGM and logs, showing mostly at goal glycemic profile.  His POCT A1c today is 6.9%, improving from last visit of 7.2%.  He admits he did indulge over the holidays but is getting back on track.  Analysis of his CGM shows TIR 81%, TAR 19%, TBR 0% with a GMI of 6.9%.   Recent labs reviewed.  - I had a long discussion with him about the progressive nature of diabetes and the pathology behind its complications. -his diabetes is complicated by CKD and he remains at a high risk for more acute and chronic complications which include CAD, CVA, CKD, retinopathy, and neuropathy. These are all discussed in detail with him.  - Nutritional counseling repeated  at each appointment due to patients tendency to fall back in to old habits.  - The patient admits there is a room for improvement in their diet and drink choices. -  Suggestion is made for the patient to  avoid simple carbohydrates from their diet including Cakes, Sweet Desserts / Pastries, Ice Cream, Soda (diet and regular), Sweet Tea, Candies, Chips, Cookies, Sweet Pastries, Store Bought Juices, Alcohol in Excess of 1-2 drinks a day, Artificial Sweeteners, Coffee Creamer, and "Sugar-free" Products. This will help patient to have stable blood glucose profile and potentially avoid unintended weight gain.   - I encouraged the patient to switch to unprocessed or minimally processed complex starch and increased protein intake (animal or plant source), fruits, and vegetables.   - Patient is advised to stick to a routine mealtimes to eat 3 meals a day and avoid unnecessary snacks (to snack only to correct hypoglycemia).  - I have approached him with the following individualized plan to manage his diabetes and patient agrees:   - he will continue to benefit from basal insulin.    -He is advised to increase his Ozempic to 2 mg SQ weekly and continue his Lantus 10 units SQ nightly.   May try to stop Lantus at next visit.  -He is encouraged to continue monitoring blood glucose twice daily (using his CGM), before breakfast and before bed, and to call the clinic if he has readings less than 70 or greater than 200 for 3 tests in a row.   - he is warned not to take insulin without proper monitoring per orders.  -He reports that he had developed unexplained cough while taking Glipizide and Metformin.  After he finished his Glipizide and Metformin, reportedly the cough has improved.  He is advised to stay off of Glipizide and Metformin for now.   - Specific targets for  A1c;  LDL, HDL,  and Triglycerides were discussed with the patient.  2) Blood Pressure /Hypertension:  His blood pressure is at target today.  he is advised to continue his current medications including Amlodipine/Olmesartan 10-20 mg p.o. daily.  3) Lipids/Hyperlipidemia:   His most recent lipid panel from 11/03/23 shows controlled LDL of 38  and elevated triglycerides of 182.  He is advised to continue Crestor 5 mg po every other day at bedtime and Fenofibrate 48 mg po daily.  Side effects and precautions discussed with him.   4)  Weight/Diet:  His Body mass index is 31.11 kg/m.  -   clearly complicating his diabetes care.   he is a candidate for weight loss. I discussed with him the fact that loss of 5 - 10% of his  current body weight will have the most impact on his diabetes management.  Exercise, and detailed carbohydrates information provided  -  detailed on discharge instructions.  5) Chronic Care/Health Maintenance: -he is on a Statin medications and is encouraged to initiate and continue to follow up with Ophthalmology, Dentist, Podiatrist at least yearly or according to recommendations, and advised to stay away from smoking. I have recommended yearly flu vaccine and pneumonia vaccine at least every 5 years; moderate intensity exercise for up to 150 minutes weekly; and  sleep for at least 7 hours a day.  - he is advised to maintain close follow up with Norman Perches, MD for primary care needs, as well as his other providers for optimal and coordinated care.     I spent  42  minutes in the care of  the patient today including review of labs from CMP, Lipids, Thyroid Function, Hematology (current and previous including abstractions from other facilities); face-to-face time discussing  his blood glucose readings/logs, discussing hypoglycemia and hyperglycemia episodes and symptoms, medications doses, his options of short and long term treatment based on the latest standards of care / guidelines;  discussion about incorporating lifestyle medicine;  and documenting the encounter. Risk reduction counseling performed per USPSTF guidelines to reduce obesity and cardiovascular risk factors.     Please refer to Patient Instructions for Blood Glucose Monitoring and Insulin/Medications Dosing Guide"  in media tab for additional  information. Please  also refer to " Patient Self Inventory" in the Media  tab for reviewed elements of pertinent patient history.  [name] participated in the discussions, expressed understanding, and voiced agreement with the above plans.  All questions were answered to his satisfaction. he is encouraged to contact clinic should he have any questions or concerns prior to his return visit.   Follow up plan: - Return in about 4 months (around 03/24/2024) for Diabetes F/U with A1c in office, No previsit labs, Bring meter and logs.  Ronny Bacon, [facility] [address] [place], [place] [address] Phone: [phone] Fax: [phone]  11/25/2023, 10:27 AM

## 2023-11-28 ENCOUNTER — Encounter: Payer: Self-pay | Admitting: Family Medicine

## 2023-11-28 NOTE — Progress Notes (Signed)
   Norman Peters     MRN: [id number]      DOB: [birth year]  Chief Complaint  Patient presents with   Annual Exam    cpe    HPI: Patient is in for annual physical exam. Uncontrolled allergies are addressed and medication prescribed. Recent labs, if available are reviewed. Immunization is reviewed , and  needs Covid vaccine    PE; BP 120/74 (BP Location: Right Arm, Patient Position: Sitting, Cuff Size: Large)   Pulse 97   Ht 5' 8 (1.727 m)   Wt 204 lb (92.5 kg)   SpO2 93%   BMI 31.02 kg/m   Pleasant male, alert and oriented x 3, in no cardio-pulmonary distress. Afebrile. HEENT No facial trauma or asymetry. Sinuses non tender. EOMI External ears normal,  Neck: supple, no adenopathy,JVD or thyromegaly.No bruits.  Chest: Clear to ascultation bilaterally.No crackles or wheezes. Non tender to palpation  Cardiovascular system; Heart sounds normal,  S1 and  S2 ,no S3.  No murmur, or thrill. Apical beat not displaced Peripheral pulses normal.  Abdomen: Soft, non tender, no organomegaly or masses. No bruits. Bowel sounds normal. No guarding, tenderness or rebound.    Musculoskeletal exam: Decreased  ROM of spine, hips , shoulders and knees.  deformity ,swelling and  crepitus noted.in knees No muscle wasting or atrophy.   Neurologic: Cranial nerves 2 to 12 intact. Power, tone ,sensation normal throughout. No disturbance in gait. No tremor.  Skin: Intact, no ulceration, erythema , scaling or rash noted. Pigmentation normal throughout  Psych; Normal mood and affect. Judgement and concentration normal   Assessment & Plan:  Annual visit for general adult medical examination with abnormal findings Annual exam as documented. Counseling done  re healthy lifestyle involving commitment to 150 minutes exercise per week, heart healthy diet, and attaining healthy weight.The importance of adequate sleep also discussed. Regular seat belt use and home safety, is also  discussed. Changes in health habits are decided on by the patient with goals and time frames  set for achieving them. Immunization and cancer screening needs are specifically addressed at this visit.

## 2023-11-28 NOTE — Assessment & Plan Note (Signed)

## 2023-12-11 ENCOUNTER — Other Ambulatory Visit: Payer: Self-pay | Admitting: Family Medicine

## 2023-12-20 ENCOUNTER — Telehealth: Payer: Self-pay | Admitting: Family Medicine

## 2023-12-20 NOTE — Telephone Encounter (Signed)
[employer] care plan   Noted Copied Sleeved (put in provider box)  Call patient when completed

## 2023-12-29 ENCOUNTER — Ambulatory Visit: Payer: Medicare HMO | Admitting: Family Medicine

## 2024-01-15 ENCOUNTER — Other Ambulatory Visit: Payer: Self-pay | Admitting: Family Medicine

## 2024-01-21 ENCOUNTER — Other Ambulatory Visit: Payer: Self-pay | Admitting: Family Medicine

## 2024-01-31 ENCOUNTER — Ambulatory Visit (INDEPENDENT_AMBULATORY_CARE_PROVIDER_SITE_OTHER): Admitting: Family Medicine

## 2024-01-31 ENCOUNTER — Encounter: Payer: Self-pay | Admitting: Family Medicine

## 2024-01-31 VITALS — BP 118/76 | HR 94 | Ht 68.0 in | Wt 205.0 lb

## 2024-01-31 DIAGNOSIS — R062 Wheezing: Secondary | ICD-10-CM

## 2024-01-31 DIAGNOSIS — F419 Anxiety disorder, unspecified: Secondary | ICD-10-CM | POA: Diagnosis not present

## 2024-01-31 DIAGNOSIS — F32A Depression, unspecified: Secondary | ICD-10-CM | POA: Diagnosis not present

## 2024-01-31 DIAGNOSIS — I10 Essential (primary) hypertension: Secondary | ICD-10-CM | POA: Diagnosis not present

## 2024-01-31 DIAGNOSIS — E66811 Obesity, class 1: Secondary | ICD-10-CM | POA: Diagnosis not present

## 2024-01-31 DIAGNOSIS — E785 Hyperlipidemia, unspecified: Secondary | ICD-10-CM | POA: Diagnosis not present

## 2024-01-31 DIAGNOSIS — J302 Other seasonal allergic rhinitis: Secondary | ICD-10-CM

## 2024-01-31 DIAGNOSIS — E1169 Type 2 diabetes mellitus with other specified complication: Secondary | ICD-10-CM | POA: Diagnosis not present

## 2024-01-31 DIAGNOSIS — Z794 Long term (current) use of insulin: Secondary | ICD-10-CM | POA: Diagnosis not present

## 2024-01-31 MED ORDER — CHLORPHENIRAMINE MALEATE 4 MG PO TABS: ORAL_TABLET | ORAL | 0 refills | Status: DC

## 2024-01-31 MED ORDER — ALBUTEROL SULFATE HFA 108 (90 BASE) MCG/ACT IN AERS: 2.0000 | INHALATION_SPRAY | Freq: Every day | RESPIRATORY_TRACT | 0 refills | Status: AC | PRN

## 2024-01-31 MED ORDER — AZELASTINE HCL 0.1 % NA SOLN: 2.0000 | Freq: Two times a day (BID) | NASAL | 12 refills | Status: DC

## 2024-01-31 MED ORDER — CHLORTHALIDONE 25 MG PO TABS: 25.0000 mg | ORAL_TABLET | Freq: Every day | ORAL | 0 refills | Status: DC

## 2024-01-31 MED ORDER — CLOTRIMAZOLE-BETAMETHASONE 1-0.05 % EX CREA: 1.0000 | TOPICAL_CREAM | Freq: Two times a day (BID) | CUTANEOUS | 0 refills | Status: DC

## 2024-01-31 MED ORDER — ALLOPURINOL 300 MG PO TABS: 300.0000 mg | ORAL_TABLET | Freq: Every day | ORAL | 0 refills | Status: DC

## 2024-01-31 NOTE — Assessment & Plan Note (Signed)
 Hyperlipidemia:Low fat diet discussed and encouraged.   Lipid Panel  Lab Results  Component Value Date   CHOL 105 11/03/2023   HDL 37 (L) 11/03/2023   LDLCALC 38 11/03/2023   TRIG 182 (H) 11/03/2023   CHOLHDL 2.8 11/03/2023     Updated lab needed at/ before next visit. Needs to lower fat intake

## 2024-01-31 NOTE — Progress Notes (Signed)
 Norman Peters     MRN: [id number]      DOB: February 13, 1956  Chief Complaint  Patient presents with   Follow-up    Review from illness : still experiencing cough, and chest congestion in the morning. Sinus pressure and drainage, ears are stopped up     HPI Norman Peters is here for follow up and re-evaluation of chronic medical conditions, medication management and review of any available recent lab and radiology data.  Preventive health is updated, specifically  Cancer screening and Immunization.   Questions or concerns regarding consultations or procedures which the PT has had in the interim are  addressed. The PT denies any adverse reactions to current medications since the last visit.  Cough x 2 weeks, was worse  initially, go better after 1 eek, still has cough esp when lying down feels drainage atback of throat, sputum is clear , no fever or chills Denies polyuria, polydipsia, blurred vision , or hypoglycemic episodes.  Blood sugar very well controlled  ROS Denies recent fever or chills. . Denies chest pains, palpitations and leg swelling Denies abdominal pain, nausea, vomiting,diarrhea or constipation.   Denies dysuria, frequency, hesitancy or incontinence. Denies uncontrolled joint pain, swelling and limitation in mobility. Denies headaches, seizures, numbness, or tingling. Denies depression, anxiety or insomnia. Denies skin break down or rash.   PE  BP 118/76   Pulse 94   Ht 5\' 8"  (1.727 m)   Wt 205 lb (93 kg)   SpO2 92%   BMI 31.17 kg/m   Patient alert and oriented and in no cardiopulmonary distress.  HEENT: No facial asymmetry, EOMI,     Neck supple .  Chest: Clear to auscultation bilaterally.  CVS: S1, S2 no murmurs, no S3.Regular rate.  ABD: Soft non tender.   Ext: No edema  MS: Adequate though reduced  ROM spine, shoulders, hips and knees.  Skin: Intact, no ulcerations or rash noted.  Psych: Good eye contact, normal affect. Memory intact not anxious or  depressed appearing.  CNS: CN 2-12 intact, power,  normal throughout.no focal deficits noted.   Assessment & Plan  Seasonal allergies Currently uncontrolled , continue daily singulair, add astelling and short term as needed chlorpheniramine  Wheezing Reports intermittent wheezing with SOB, albuterol prescribed one only  Hyperlipidemia LDL goal <100 Hyperlipidemia:Low fat diet discussed and encouraged.   Lipid Panel  Lab Results  Component Value Date   CHOL 105 11/03/2023   HDL 37 (L) 11/03/2023   LDLCALC 38 11/03/2023   TRIG 182 (H) 11/03/2023   CHOLHDL 2.8 11/03/2023     Updated lab needed at/ before next visit. Needs to lower fat intake  Anxiety and depression Controlled, no change in medication   Essential hypertension, benign Controlled, no change in medication DASH diet and commitment to daily physical activity for a minimum of 30 minutes discussed and encouraged, as a part of hypertension management. The importance of attaining a healthy weight is also discussed.     01/31/2024    9:42 AM 11/25/2023    8:34 AM 11/22/2023    2:51 PM 11/03/2023    9:51 AM 11/03/2023    9:21 AM 08/31/2023    8:09 AM 07/26/2023    8:23 AM  BP/Weight  Systolic BP 118 115 120 128 134 125 112  Diastolic BP 76 72 74 72 77 73 71  Wt. (Lbs) 205 204.6 204  203 202.4 203.4  BMI 31.17 kg/m2 31.11 kg/m2 31.02 kg/m2  30.87 kg/m2 30.77 kg/m2 30.93  kg/m2       Type 2 diabetes mellitus with other specified complication (HCC) Diabetes associated with hypertension, hyperlipidemia, arthritis, and depression  Norman Peters is reminded of the importance of commitment to daily physical activity for 30 minutes or more, as able and the need to limit carbohydrate intake to 30 to 60 grams per meal to help with blood sugar control.   The need to take medication as prescribed, test blood sugar as directed, and to call between visits if there is a concern that blood sugar is uncontrolled is also  discussed.   Norman Peters is reminded of the importance of daily foot exam, annual eye examination, and good blood sugar, blood pressure and cholesterol control.     Latest Ref Rng & Units 11/25/2023    8:51 AM 11/03/2023    9:57 AM 07/26/2023    8:41 AM 04/22/2023    2:10 PM 04/20/2023    9:18 AM  Diabetic Labs  HbA1c 4.0 - 5.6 % 6.9   7.3   7.0   Micro/Creat Ratio 0 - 29 mg/g creat    303    Chol 100 - 199 mg/dL  161      HDL >09 mg/dL  37      Calc LDL 0 - 99 mg/dL  38      Triglycerides 0 - 149 mg/dL  604      Creatinine 5.40 - 1.27 mg/dL  9.81          1/91/4782    9:42 AM 11/25/2023    8:34 AM 11/22/2023    2:51 PM 11/03/2023    9:51 AM 11/03/2023    9:21 AM 08/31/2023    8:09 AM 07/26/2023    8:23 AM  BP/Weight  Systolic BP 118 115 120 128 134 125 112  Diastolic BP 76 72 74 72 77 73 71  Wt. (Lbs) 205 204.6 204  203 202.4 203.4  BMI 31.17 kg/m2 31.11 kg/m2 31.02 kg/m2  30.87 kg/m2 30.77 kg/m2 30.93 kg/m2      Latest Ref Rng & Units 04/21/2023   12:00 AM 04/20/2023    9:30 AM  Foot/eye exam completion dates  Eye Exam No Retinopathy No Retinopathy       Foot Form Completion   Done     This result is from an external source.      Excellent control, managed by Endo  Obesity (BMI 30.0-34.9)  Patient re-educated about  the importance of commitment to a  minimum of 150 minutes of exercise per week as able.  The importance of healthy food choices with portion control discussed, as well as eating regularly and within a 12 hour window most days. The need to choose "clean , [place]" food 50 to 75% of the time is discussed, as well as to make water the primary drink and set a goal of 64 ounces water daily.       01/31/2024    9:42 AM 11/25/2023    8:34 AM 11/22/2023    2:51 PM  Weight /BMI  Weight 205 lb 204 lb 9.6 oz 204 lb  Height 5\' 8"  (1.727 m) 5\' 8"  (1.727 m) 5\' 8"  (1.727 m)  BMI 31.17 kg/m2 31.11 kg/m2 31.02 kg/m2

## 2024-01-31 NOTE — Assessment & Plan Note (Signed)
 Controlled, no change in medication

## 2024-01-31 NOTE — Assessment & Plan Note (Signed)
 Currently uncontrolled , continue daily singulair, add astelling and short term as needed chlorpheniramine

## 2024-01-31 NOTE — Assessment & Plan Note (Signed)
 Reports intermittent wheezing with SOB, albuterol prescribed one only

## 2024-01-31 NOTE — Assessment & Plan Note (Signed)
 Diabetes associated with hypertension, hyperlipidemia, arthritis, and depression  [name] is reminded of the importance of commitment to daily physical activity for 30 minutes or more, as able and the need to limit carbohydrate intake to 30 to 60 grams per meal to help with blood sugar control.   The need to take medication as prescribed, test blood sugar as directed, and to call between visits if there is a concern that blood sugar is uncontrolled is also discussed.   [name] is reminded of the importance of daily foot exam, annual eye examination, and good blood sugar, blood pressure and cholesterol control.     Latest Ref Rng & Units 11/25/2023    8:51 AM 11/03/2023    9:57 AM 07/26/2023    8:41 AM 04/22/2023    2:10 PM 04/20/2023    9:18 AM  Diabetic Labs  HbA1c 4.0 - 5.6 % 6.9   7.3   7.0   Micro/Creat Ratio 0 - 29 mg/g creat    303    Chol 100 - 199 mg/dL  295      HDL >62 mg/dL  37      Calc LDL 0 - 99 mg/dL  38      Triglycerides 0 - 149 mg/dL  130      Creatinine 8.65 - 1.27 mg/dL  7.84          6/96/2952    9:42 AM 11/25/2023    8:34 AM 11/22/2023    2:51 PM 11/03/2023    9:51 AM 11/03/2023    9:21 AM 08/31/2023    8:09 AM 07/26/2023    8:23 AM  BP/Weight  Systolic BP 118 115 120 128 134 125 112  Diastolic BP 76 72 74 72 77 73 71  Wt. (Lbs) 205 204.6 204  203 202.4 203.4  BMI 31.17 kg/m2 31.11 kg/m2 31.02 kg/m2  30.87 kg/m2 30.77 kg/m2 30.93 kg/m2      Latest Ref Rng & Units 04/21/2023   12:00 AM 04/20/2023    9:30 AM  Foot/eye exam completion dates  Eye Exam No Retinopathy No Retinopathy       Foot Form Completion   Done     This result is from an external source.      Excellent control, managed by Endo

## 2024-01-31 NOTE — Patient Instructions (Addendum)
 F/U in June as before, call if you  need me sooner  New for allergies  is daily nose spray, decongestant tablets are coming through the mail,uise as directed  Cough is from uncontrolled allergies  It is important that you exercise regularly at least 30 minutes 5 times a week. If you develop chest pain, have severe difficulty breathing, or feel very tired, stop exercising immediately and seek medical attention   Think about what you will eat, plan ahead. Choose " clean, [place], fresh or frozen" over canned, processed or packaged foods which are more sugary, salty and fatty. 70 to 75% of food eaten should be vegetables and fruit. Three meals at set times with snacks allowed between meals, but they must be fruit or vegetables. Aim to eat over a 12 hour period , example 7 am to 7 pm, and STOP after  your last meal of the day. Drink water,generally about 64 ounces per day, no other drink is as healthy. Fruit juice is best enjoyed in a healthy way, by EATING the fruit.   Thanks for choosing [facility], we consider it a privelige to serve you.

## 2024-01-31 NOTE — Assessment & Plan Note (Signed)
  Patient re-educated about  the importance of commitment to a  minimum of 150 minutes of exercise per week as able.  The importance of healthy food choices with portion control discussed, as well as eating regularly and within a 12 hour window most days. The need to choose "clean , [place]" food 50 to 75% of the time is discussed, as well as to make water the primary drink and set a goal of 64 ounces water daily.       01/31/2024    9:42 AM 11/25/2023    8:34 AM 11/22/2023    2:51 PM  Weight /BMI  Weight 205 lb 204 lb 9.6 oz 204 lb  Height 5\' 8"  (1.727 m) 5\' 8"  (1.727 m) 5\' 8"  (1.727 m)  BMI 31.17 kg/m2 31.11 kg/m2 31.02 kg/m2

## 2024-01-31 NOTE — Assessment & Plan Note (Signed)
 Controlled, no change in medication DASH diet and commitment to daily physical activity for a minimum of 30 minutes discussed and encouraged, as a part of hypertension management. The importance of attaining a healthy weight is also discussed.     01/31/2024    9:42 AM 11/25/2023    8:34 AM 11/22/2023    2:51 PM 11/03/2023    9:51 AM 11/03/2023    9:21 AM 08/31/2023    8:09 AM 07/26/2023    8:23 AM  BP/Weight  Systolic BP 118 115 120 128 134 125 112  Diastolic BP 76 72 74 72 77 73 71  Wt. (Lbs) 205 204.6 204  203 202.4 203.4  BMI 31.17 kg/m2 31.11 kg/m2 31.02 kg/m2  30.87 kg/m2 30.77 kg/m2 30.93 kg/m2

## 2024-02-01 ENCOUNTER — Other Ambulatory Visit: Payer: Self-pay | Admitting: Family Medicine

## 2024-02-15 ENCOUNTER — Other Ambulatory Visit: Payer: Self-pay | Admitting: Family Medicine

## 2024-02-23 DIAGNOSIS — E1165 Type 2 diabetes mellitus with hyperglycemia: Secondary | ICD-10-CM | POA: Diagnosis not present

## 2024-02-23 DIAGNOSIS — E1122 Type 2 diabetes mellitus with diabetic chronic kidney disease: Secondary | ICD-10-CM | POA: Diagnosis not present

## 2024-02-25 ENCOUNTER — Encounter (HOSPITAL_COMMUNITY): Payer: Self-pay | Admitting: Emergency Medicine

## 2024-02-25 ENCOUNTER — Emergency Department (HOSPITAL_COMMUNITY)

## 2024-02-25 ENCOUNTER — Other Ambulatory Visit: Payer: Self-pay

## 2024-02-25 ENCOUNTER — Emergency Department (HOSPITAL_COMMUNITY)
Admission: EM | Admit: 2024-02-25 | Discharge: 2024-02-25 | Disposition: A | Discharge status: Home / Self Care | Attending: Emergency Medicine | Admitting: Emergency Medicine

## 2024-02-25 DIAGNOSIS — R1084 Generalized abdominal pain: Secondary | ICD-10-CM

## 2024-02-25 DIAGNOSIS — Z794 Long term (current) use of insulin: Secondary | ICD-10-CM | POA: Insufficient documentation

## 2024-02-25 DIAGNOSIS — Z7982 Long term (current) use of aspirin: Secondary | ICD-10-CM | POA: Insufficient documentation

## 2024-02-25 DIAGNOSIS — R1031 Right lower quadrant pain: Secondary | ICD-10-CM | POA: Insufficient documentation

## 2024-02-25 DIAGNOSIS — R1030 Lower abdominal pain, unspecified: Secondary | ICD-10-CM | POA: Diagnosis not present

## 2024-02-25 DIAGNOSIS — N3289 Other specified disorders of bladder: Secondary | ICD-10-CM | POA: Diagnosis not present

## 2024-02-25 DIAGNOSIS — R935 Abnormal findings on diagnostic imaging of other abdominal regions, including retroperitoneum: Secondary | ICD-10-CM | POA: Diagnosis not present

## 2024-02-25 DIAGNOSIS — K429 Umbilical hernia without obstruction or gangrene: Secondary | ICD-10-CM | POA: Diagnosis not present

## 2024-02-25 LAB — URINALYSIS, ROUTINE W REFLEX MICROSCOPIC
Bacteria, UA: NONE SEEN
Bilirubin Urine: NEGATIVE
Glucose, UA: NEGATIVE mg/dL
Hgb urine dipstick: NEGATIVE
Ketones, ur: NEGATIVE mg/dL
Leukocytes,Ua: NEGATIVE
Nitrite: NEGATIVE
Protein, ur: 30 mg/dL — AB
Specific Gravity, Urine: 1.019 (ref 1.005–1.030)
pH: 5 (ref 5.0–8.0)

## 2024-02-25 LAB — COMPREHENSIVE METABOLIC PANEL WITH GFR
ALT: 15 U/L (ref 0–44)
AST: 14 U/L — ABNORMAL LOW (ref 15–41)
Albumin: 4.1 g/dL (ref 3.5–5.0)
Alkaline Phosphatase: 83 U/L (ref 38–126)
Anion gap: 10 (ref 5–15)
BUN: 20 mg/dL (ref 8–23)
CO2: 22 mmol/L (ref 22–32)
Calcium: 9.6 mg/dL (ref 8.9–10.3)
Chloride: 104 mmol/L (ref 98–111)
Creatinine, Ser: 1.87 mg/dL — ABNORMAL HIGH (ref 0.61–1.24)
GFR, Estimated: 39 mL/min — ABNORMAL LOW (ref >60)
Glucose, Bld: 130 mg/dL — ABNORMAL HIGH (ref 70–99)
Potassium: 3.7 mmol/L (ref 3.5–5.1)
Sodium: 136 mmol/L (ref 135–145)
Total Bilirubin: 0.4 mg/dL (ref 0.0–1.2)
Total Protein: 7.7 g/dL (ref 6.5–8.1)

## 2024-02-25 LAB — CBC WITH DIFFERENTIAL/PLATELET
Abs Immature Granulocytes: 0.08 10*3/uL — ABNORMAL HIGH (ref 0.00–0.07)
Basophils Absolute: 0.1 10*3/uL (ref 0.0–0.1)
Basophils Relative: 1 %
Eosinophils Absolute: 0.5 10*3/uL (ref 0.0–0.5)
Eosinophils Relative: 5 %
HCT: 46.2 % (ref 39.0–52.0)
Hemoglobin: 15.2 g/dL (ref 13.0–17.0)
Immature Granulocytes: 1 %
Lymphocytes Relative: 23 %
Lymphs Abs: 2.6 10*3/uL (ref 0.7–4.0)
MCH: 29.6 pg (ref 26.0–34.0)
MCHC: 32.9 g/dL (ref 30.0–36.0)
MCV: 90.1 fL (ref 80.0–100.0)
Monocytes Absolute: 1 10*3/uL (ref 0.1–1.0)
Monocytes Relative: 9 %
Neutro Abs: 6.9 10*3/uL (ref 1.7–7.7)
Neutrophils Relative %: 61 %
Platelets: 322 10*3/uL (ref 150–400)
RBC: 5.13 MIL/uL (ref 4.22–5.81)
RDW: 14 % (ref 11.5–15.5)
WBC: 11.1 10*3/uL — ABNORMAL HIGH (ref 4.0–10.5)
nRBC: 0 % (ref 0.0–0.2)

## 2024-02-25 MED ORDER — CEPHALEXIN 500 MG PO CAPS: 500.0000 mg | ORAL_CAPSULE | Freq: Four times a day (QID) | ORAL | 0 refills | Status: AC

## 2024-02-25 MED ORDER — IOHEXOL 300 MG/ML  SOLN
100.0000 mL | Freq: Once | INTRAMUSCULAR | Status: AC | PRN
  Administered 2024-02-25: 100 mL via INTRAVENOUS

## 2024-02-25 MED ORDER — CEPHALEXIN 500 MG PO CAPS
500.0000 mg | ORAL_CAPSULE | Freq: Once | ORAL | Status: AC
  Administered 2024-02-25: 500 mg via ORAL
  Filled 2024-02-25: qty 1

## 2024-02-25 NOTE — ED Notes (Signed)
 Patient aware we need a UA, urinal at bedside.

## 2024-02-25 NOTE — Discharge Instructions (Signed)
 The CTs and shows that you may have a bladder infection, please take the cephalexin  as prescribed to treat this infection, you can follow-up with your family doctor this week.  ER for severe or worse  symptoms   thank you for allowing us  to treat you in the emergency department today.  After reviewing your examination and potential testing that was done it appears that you are safe to go home.  I would like for you to follow-up with your doctor within the next several days, have them obtain your records and follow-up with them to review all potential tests and results from your visit.  If you should develop severe or worsening symptoms return to the emergency department immediately

## 2024-02-25 NOTE — ED Provider Notes (Signed)
 [place] EMERGENCY DEPARTMENT AT [facility] Provider Note   CSN: [id number] Arrival date & time: 02/25/24  1150     History {Add pertinent medical, surgical, social history, OB history to HPI:1} Chief Complaint  Patient presents with   Abdominal Pain    Norman Peters is a 68 y.o. male.  Patient complains of lower abdominal pain and some right flank pain.  Patient also complains of difficulty getting his stream up.  No fevers no chills  The history is provided by the patient and medical records. No language interpreter was used.  Abdominal Pain Pain location:  Suprapubic and RLQ Pain quality: aching   Pain radiates to:  Does not radiate Pain severity:  Mild Onset quality:  Sudden Timing:  Constant Progression:  Worsening Chronicity:  New Relieved by:  Nothing Worsened by:  Nothing Associated symptoms: no chest pain, no cough, no diarrhea, no fatigue and no hematuria        Home Medications Prior to Admission medications   Medication Sig Start Date End Date Taking? Authorizing Provider  acetaminophen  (TYLENOL ) 325 MG tablet Take 650 mg by mouth every 6 (six) hours as needed.    [provider]  albuterol  (VENTOLIN  HFA) 108 (90 Base) MCG/ACT inhaler Inhale 2 puffs into the lungs daily as needed, for wheezing and shortness of breath. 01/31/24   Towanda Fret, MD  allopurinol  (ZYLOPRIM ) 300 MG tablet Take 1 tablet (300 mg total) by mouth daily. 01/31/24   Towanda Fret, MD  amLODipine -olmesartan  (AZOR ) 10-40 MG tablet TAKE 1 TABLET EVERY DAY 02/15/24   Towanda Fret, MD  aspirin EC 81 MG tablet Take 81 mg by mouth every morning. 03/08/16   Towanda Fret, MD  azelastine  (ASTELIN ) 0.1 % nasal spray Place 2 sprays into both nostrils 2 (two) times daily. Use in each nostril as directed 01/31/24   Towanda Fret, MD  busPIRone  (BUSPAR ) 5 MG tablet Take 1 tablet (5 mg total) by mouth 3 (three) times daily. 08/09/23   Towanda Fret, MD   chlorpheniramine  (CHLOR-TRIMETON ) 4 MG tablet Take one tablet by mouth every morning fr 1 week, then as needed 01/31/24   Towanda Fret, MD  chlorthalidone  (HYGROTON ) 25 MG tablet Take 1 tablet (25 mg total) by mouth daily. 01/31/24   Towanda Fret, MD  Cholecalciferol (VITAMIN D -3) 25 MCG (1000 UT) CAPS Take by mouth daily.    [provider]  clotrimazole -betamethasone  (LOTRISONE ) cream Apply 1 Application topically 2 (two) times daily. 01/31/24   Towanda Fret, MD  cyanocobalamin  1000 MCG tablet Take 1,000 mcg by mouth daily.    [provider]  FLUoxetine  (PROZAC ) 40 MG capsule TAKE 1 CAPSULE EVERY DAY 08/22/23   Towanda Fret, MD  insulin  glargine (LANTUS  SOLOSTAR) 100 UNIT/ML Solostar Pen Inject 10 Units into the skin at bedtime. 11/25/23   Wendel Hals, NP  Insulin  Pen Needle (PEN NEEDLES) 32G X 4 MM MISC Use to inject insulin  once daily 11/25/23   Wendel Hals, NP  montelukast  (SINGULAIR ) 10 MG tablet Take 1 tablet (10 mg total) by mouth at bedtime. 11/22/23   Towanda Fret, MD  POTASSIUM PO Take by mouth daily.    [provider]  rosuvastatin  (CRESTOR ) 5 MG tablet TAKE 1 TABLET EVERY MONDAY, WEDNESDAY AND FRIDAY (DISCONTINUE PRAVASTATIN ) 02/01/24   Towanda Fret, MD  Semaglutide , 2 MG/DOSE, 8 MG/3ML SOPN Inject 2 mg as directed once a week. 11/25/23   Verlyn Goad,  Arminda Landmark, NP  TRUE METRIX BLOOD GLUCOSE TEST test strip TEST BLOOD SUGAR ONE TIME DAILY AS DIRECTED 08/15/23   Towanda Fret, MD      Allergies    Ibuprofen, Lisinopril, and Strawberry [berry]    Review of Systems   Review of Systems  Constitutional:  Negative for appetite change and fatigue.  HENT:  Negative for congestion, ear discharge and sinus pressure.   Eyes:  Negative for discharge.  Respiratory:  Negative for cough.   Cardiovascular:  Negative for chest pain.  Gastrointestinal:  Positive for abdominal pain. Negative for diarrhea.   Genitourinary:  Negative for frequency and hematuria.  Musculoskeletal:  Negative for back pain.  Skin:  Negative for rash.  Neurological:  Negative for seizures and headaches.  Psychiatric/Behavioral:  Negative for hallucinations.     Physical Exam Updated Vital Signs BP 132/76 (BP Location: Right Arm)   Pulse (!) 102   Temp 99.1 F (37.3 C) (Oral)   Resp 16   Ht 5\' 8"  (1.727 m)   Wt 93 kg   SpO2 96%   BMI 31.17 kg/m  Physical Exam Vitals and nursing note reviewed.  Constitutional:      Appearance: He is well-developed.  HENT:     Head: Normocephalic.     Nose: Nose normal.  Eyes:     General: No scleral icterus.    Conjunctiva/sclera: Conjunctivae normal.  Neck:     Thyroid : No thyromegaly.  Cardiovascular:     Rate and Rhythm: Normal rate and regular rhythm.     Heart sounds: No murmur heard.    No friction rub. No gallop.  Pulmonary:     Breath sounds: No stridor. No wheezing or rales.  Chest:     Chest wall: No tenderness.  Abdominal:     General: There is no distension.     Tenderness: There is abdominal tenderness. There is no rebound.     Comments: Patient with tenderness to right groin.  And mild tenderness suprapubic  Musculoskeletal:        General: Normal range of motion.     Cervical back: Neck supple.  Lymphadenopathy:     Cervical: No cervical adenopathy.  Skin:    Findings: No erythema or rash.  Neurological:     Mental Status: He is alert and oriented to person, place, and time.     Motor: No abnormal muscle tone.     Coordination: Coordination normal.  Psychiatric:        Behavior: Behavior normal.     ED Results / Procedures / Treatments   Labs (all labs ordered are listed, but only abnormal results are displayed) Labs Reviewed  CBC WITH DIFFERENTIAL/PLATELET - Abnormal; Notable for the following components:      Result Value   WBC 11.1 (*)    Abs Immature Granulocytes 0.08 (*)    All other components within normal limits   COMPREHENSIVE METABOLIC PANEL WITH GFR - Abnormal; Notable for the following components:   Glucose, Bld 130 (*)    Creatinine, Ser 1.87 (*)    AST 14 (*)    GFR, Estimated 39 (*)    All other components within normal limits  URINALYSIS, ROUTINE W REFLEX MICROSCOPIC    EKG None  Radiology No results found.  Procedures Procedures  {Document cardiac monitor, telemetry assessment procedure when appropriate:1}  Medications Ordered in ED Medications  iohexol  (OMNIPAQUE ) 300 MG/ML solution 100 mL (100 mLs Intravenous Contrast Given 02/25/24 1356)  ED Course/ Medical Decision Making/ A&P   {   Click here for ABCD2, HEART and other calculatorsREFRESH Note before signing :1}                              Medical Decision Making Amount and/or Complexity of Data Reviewed Labs: ordered. Radiology: ordered.  Risk Prescription drug management.   Patient with right inguinal pain.  {Document critical care time when appropriate:1} {Document review of labs and clinical decision tools ie heart score, Chads2Vasc2 etc:1}  {Document your independent review of radiology images, and any outside records:1} {Document your discussion with family members, caretakers, and with consultants:1} {Document social determinants of health affecting pt's care:1} {Document your decision making why or why not admission, treatments were needed:1} Final Clinical Impression(s) / ED Diagnoses Final diagnoses:  None    Rx / DC Orders ED Discharge Orders     None

## 2024-02-25 NOTE — ED Triage Notes (Signed)
 Pt to ER with c/o RLQ abdominal pain that radiates into flank.  Pt states pain for last week.  States some difficulty with urine stream.  Denies n/v.

## 2024-02-25 NOTE — ED Provider Notes (Signed)
 Pt HD stable Reviewed UA and CT -  Non specific, UA cleanish - CT with ? Pyelo -  Presentation is non specific  Cephalexin  and home Add U cx   Early Glisson, MD 02/25/24 1746

## 2024-02-28 LAB — URINE CULTURE

## 2024-03-13 ENCOUNTER — Ambulatory Visit (INDEPENDENT_AMBULATORY_CARE_PROVIDER_SITE_OTHER): Payer: Self-pay | Admitting: Family Medicine

## 2024-03-13 ENCOUNTER — Encounter: Payer: Self-pay | Admitting: Family Medicine

## 2024-03-13 VITALS — BP 134/78 | HR 97 | Resp 16 | Ht 68.0 in | Wt 202.1 lb

## 2024-03-13 DIAGNOSIS — Z794 Long term (current) use of insulin: Secondary | ICD-10-CM

## 2024-03-13 DIAGNOSIS — I10 Essential (primary) hypertension: Secondary | ICD-10-CM

## 2024-03-13 DIAGNOSIS — E1169 Type 2 diabetes mellitus with other specified complication: Secondary | ICD-10-CM | POA: Diagnosis not present

## 2024-03-13 DIAGNOSIS — N1832 Chronic kidney disease, stage 3b: Secondary | ICD-10-CM | POA: Diagnosis not present

## 2024-03-13 DIAGNOSIS — R300 Dysuria: Secondary | ICD-10-CM

## 2024-03-13 NOTE — Patient Instructions (Addendum)
 Keep appointment as before  Urine c/S today  cBC and diff, chem 7 and EGFR today  Thanks for choosing [facility], we consider it a privelige to serve you.

## 2024-03-14 LAB — CBC WITH DIFFERENTIAL/PLATELET
Basophils Absolute: 0.1 10*3/uL (ref 0.0–0.2)
Basos: 1 %
EOS (ABSOLUTE): 0.4 10*3/uL (ref 0.0–0.4)
Eos: 4 %
Hematocrit: 45.8 % (ref 37.5–51.0)
Hemoglobin: 15.2 g/dL (ref 13.0–17.7)
Immature Grans (Abs): 0 10*3/uL (ref 0.0–0.1)
Immature Granulocytes: 0 %
Lymphocytes Absolute: 2.5 10*3/uL (ref 0.7–3.1)
Lymphs: 24 %
MCH: 29.8 pg (ref 26.6–33.0)
MCHC: 33.2 g/dL (ref 31.5–35.7)
MCV: 90 fL (ref 79–97)
Monocytes Absolute: 1 10*3/uL — ABNORMAL HIGH (ref 0.1–0.9)
Monocytes: 10 %
Neutrophils Absolute: 6.4 10*3/uL (ref 1.4–7.0)
Neutrophils: 61 %
Platelets: 292 10*3/uL (ref 150–450)
RBC: 5.1 x10E6/uL (ref 4.14–5.80)
RDW: 14 % (ref 11.6–15.4)
WBC: 10.5 10*3/uL (ref 3.4–10.8)

## 2024-03-14 LAB — CMP14+EGFR
ALT: 12 IU/L (ref 0–44)
AST: 14 IU/L (ref 0–40)
Albumin: 4.5 g/dL (ref 3.9–4.9)
Alkaline Phosphatase: 101 IU/L (ref 44–121)
BUN/Creatinine Ratio: 12 (ref 10–24)
BUN: 21 mg/dL (ref 8–27)
Bilirubin Total: 0.6 mg/dL (ref 0.0–1.2)
CO2: 23 mmol/L (ref 20–29)
Calcium: 10.1 mg/dL (ref 8.6–10.2)
Chloride: 99 mmol/L (ref 96–106)
Creatinine, Ser: 1.71 mg/dL — ABNORMAL HIGH (ref 0.76–1.27)
Globulin, Total: 2.8 g/dL (ref 1.5–4.5)
Glucose: 75 mg/dL (ref 70–99)
Potassium: 4 mmol/L (ref 3.5–5.2)
Sodium: 138 mmol/L (ref 134–144)
Total Protein: 7.3 g/dL (ref 6.0–8.5)
eGFR: 43 mL/min/{1.73_m2} — ABNORMAL LOW (ref >59)

## 2024-03-15 ENCOUNTER — Other Ambulatory Visit: Payer: Self-pay | Admitting: Family Medicine

## 2024-03-17 ENCOUNTER — Other Ambulatory Visit: Payer: Self-pay | Admitting: Family Medicine

## 2024-03-18 ENCOUNTER — Encounter: Payer: Self-pay | Admitting: Family Medicine

## 2024-03-19 LAB — URINE CULTURE

## 2024-03-20 ENCOUNTER — Telehealth: Payer: Self-pay | Admitting: Family Medicine

## 2024-03-20 NOTE — Telephone Encounter (Signed)
 R/C to Patient - Spoke with Patient and made Patient aware that Referral's can take up to 2 weeks depending on the Specialty. Patient is aware that Referral has been sent to Dr. Mcarthur Speedy Office and once the Referral is reviewed by their Office they will reach out to him for Appt Scheduling. Patient voiced understanding.

## 2024-03-20 NOTE — Telephone Encounter (Signed)
 Copied from CRM [id number]. Topic: Referral - Status >> Mar 20, 2024 10:56 AM Felizardo Hotter wrote: Reason for CRM:Pt called regarding referral# [id number] and would like an update on why is it on still pending status. Please call pt at [phone].

## 2024-03-21 ENCOUNTER — Encounter: Payer: Self-pay | Admitting: Family Medicine

## 2024-03-21 DIAGNOSIS — R300 Dysuria: Secondary | ICD-10-CM | POA: Insufficient documentation

## 2024-03-21 MED ORDER — NITROFURANTOIN MACROCRYSTAL 50 MG PO CAPS: 50.0000 mg | ORAL_CAPSULE | Freq: Two times a day (BID) | ORAL | 0 refills | Status: DC

## 2024-03-21 MED ORDER — PENICILLIN V POTASSIUM 500 MG PO TABS: 500.0000 mg | ORAL_TABLET | Freq: Three times a day (TID) | ORAL | 0 refills | Status: DC

## 2024-03-21 NOTE — Progress Notes (Signed)
 Norman Peters     MRN: [id number]      DOB: [birth year]  Chief Complaint  Patient presents with   Follow-up    Was seen in ED on 04/19 for bilateral lq abdominal pain. Was dx with uti. Has finished all prescribed meds - still experiencing lower abdominal pain that radiates towards his lower back. Denies frequency/urgency in urination, burning, or fever.     HPI Norman Peters is here for follow up and re-evaluation of chronic medical conditions, medication management and review of any available recent lab and radiology data.  Recently dx with uti in eD tho c/s was contaminated , c/o persistent symptoms  ROS Denies recent fever or chills. Denies sinus pressure, nasal congestion, ear pain or sore throat. Denies chest congestion, productive cough or wheezing. Denies chest pains, palpitations and leg swelling Denies a nausea, vomiting,diarrhea or constipation.    Denies uncontrolled  joint pain, swelling and limitation in mobility. Denies headaches, seizures, numbness, or tingling. Denies uncontrolled  depression, anxiety or insomnia. Denies skin break down or rash.   PE  BP 134/78   Pulse 97   Resp 16   Ht 5\' 8"  (1.727 m)   Wt 202 lb 1.9 oz (91.7 kg)   SpO2 95%   BMI 30.73 kg/m   Patient alert and oriented and in no cardiopulmonary distress.  HEENT: No facial asymmetry, EOMI,     Neck supple .  Chest: Clear to auscultation bilaterally.  CVS: S1, S2 no murmurs, no S3.Regular rate.  ABD: Soft mild suprapubic tenderness, no renal angle tenderness giardinmg or rebound Ext: No edema  MS: Adequate though reduced  ROM spine, shoulders, hips and knees.  Skin: Intact, no ulcerations or rash noted.  Psych: Good eye contact, normal affect. Memory intact not anxious or depressed appearing.  CNS: CN 2-12 intact, power,  normal throughout.no focal deficits noted.   Assessment & Plan  Dysuria Ongoing symptoms following recent treatment for presumed UTI rept culture Positive for  infection needs treatment  with nitrofurantoin, will call in am , original result note inaccurate  Essential hypertension, benign Controlled, no change in medication DASH diet and commitment to daily physical activity for a minimum of 30 minutes discussed and encouraged, as a part of hypertension management. The importance of attaining a healthy weight is also discussed.     03/13/2024   11:05 AM 02/25/2024    5:12 PM 02/25/2024    3:15 PM 02/25/2024   12:08 PM 01/31/2024    9:42 AM 11/25/2023    8:34 AM 11/22/2023    2:51 PM  BP/Weight  Systolic BP 134 120 129 132 118 115 120  Diastolic BP 78 80 80 76 76 72 74  Wt. (Lbs) 202.12   205 205 204.6 204  BMI 30.73 kg/m2   31.17 kg/m2 31.17 kg/m2 31.11 kg/m2 31.02 kg/m2       Type 2 diabetes mellitus with other specified complication (HCC) Diabetes associated with hypertension, hyperlipidemia, and arthritis Excellent control , managed by Endo  Norman Peters is reminded of the importance of commitment to daily physical activity for 30 minutes or more, as able and the need to limit carbohydrate intake to 30 to 60 grams per meal to help with blood sugar control.   The need to take medication as prescribed, test blood sugar as directed, and to call between visits if there is a concern that blood sugar is uncontrolled is also discussed.   Norman Peters is reminded of the importance of  daily foot exam, annual eye examination, and good blood sugar, blood pressure and cholesterol control.     Latest Ref Rng & Units 03/13/2024   11:48 AM 02/25/2024   12:40 PM 11/25/2023    8:51 AM 11/03/2023    9:57 AM 07/26/2023    8:41 AM  Diabetic Labs  HbA1c 4.0 - 5.6 %   6.9   7.3   Chol 100 - 199 mg/dL    454    HDL >09 mg/dL    37    Calc LDL 0 - 99 mg/dL    38    Triglycerides 0 - 149 mg/dL    811    Creatinine 9.14 - 1.27 mg/dL 7.82  9.56   2.13        03/13/2024   11:05 AM 02/25/2024    5:12 PM 02/25/2024    3:15 PM 02/25/2024   12:08 PM 01/31/2024    9:42  AM 11/25/2023    8:34 AM 11/22/2023    2:51 PM  BP/Weight  Systolic BP 134 120 129 132 118 115 120  Diastolic BP 78 80 80 76 76 72 74  Wt. (Lbs) 202.12   205 205 204.6 204  BMI 30.73 kg/m2   31.17 kg/m2 31.17 kg/m2 31.11 kg/m2 31.02 kg/m2      Latest Ref Rng & Units 04/21/2023   12:00 AM 04/20/2023    9:30 AM  Foot/eye exam completion dates  Eye Exam No Retinopathy No Retinopathy       Foot Form Completion   Done     This result is from an external source.        Chronic kidney disease Refer to nephrology for evaluation and managemnet

## 2024-03-21 NOTE — Telephone Encounter (Signed)
 Pls call and advise pt that on review his recent urine culture is POSITIVE for infection and I have prescribed penicillin  for 10 days which he needs to take ( note had been made that he did not have a UTI which is an error) PLS ensure pt understands and gets med sent in. Also pls call and tell [facility] NOT to dispense nitrofurantoin which I had originally prescribed, the antibiotic  he is to be dispensed is pen V  Thank you! ?? Pls ask

## 2024-03-21 NOTE — Assessment & Plan Note (Addendum)
 Diabetes associated with hypertension, hyperlipidemia, and arthritis Excellent control , managed by Endo  Norman Peters is reminded of the importance of commitment to daily physical activity for 30 minutes or more, as able and the need to limit carbohydrate intake to 30 to 60 grams per meal to help with blood sugar control.   The need to take medication as prescribed, test blood sugar as directed, and to call between visits if there is a concern that blood sugar is uncontrolled is also discussed.   Norman Peters is reminded of the importance of daily foot exam, annual eye examination, and good blood sugar, blood pressure and cholesterol control.     Latest Ref Rng & Units 03/13/2024   11:48 AM 02/25/2024   12:40 PM 11/25/2023    8:51 AM 11/03/2023    9:57 AM 07/26/2023    8:41 AM  Diabetic Labs  HbA1c 4.0 - 5.6 %   6.9   7.3   Chol 100 - 199 mg/dL    161    HDL >09 mg/dL    37    Calc LDL 0 - 99 mg/dL    38    Triglycerides 0 - 149 mg/dL    604    Creatinine 5.40 - 1.27 mg/dL 9.81  1.91   4.78        03/13/2024   11:05 AM 02/25/2024    5:12 PM 02/25/2024    3:15 PM 02/25/2024   12:08 PM 01/31/2024    9:42 AM 11/25/2023    8:34 AM 11/22/2023    2:51 PM  BP/Weight  Systolic BP 134 120 129 132 118 115 120  Diastolic BP 78 80 80 76 76 72 74  Wt. (Lbs) 202.12   205 205 204.6 204  BMI 30.73 kg/m2   31.17 kg/m2 31.17 kg/m2 31.11 kg/m2 31.02 kg/m2      Latest Ref Rng & Units 04/21/2023   12:00 AM 04/20/2023    9:30 AM  Foot/eye exam completion dates  Eye Exam No Retinopathy No Retinopathy       Foot Form Completion   Done     This result is from an external source.

## 2024-03-21 NOTE — Telephone Encounter (Signed)
 Pt informed. As well as pharmacy

## 2024-03-21 NOTE — Assessment & Plan Note (Signed)
 Refer to nephrology for evaluation and managemnet

## 2024-03-21 NOTE — Assessment & Plan Note (Addendum)
 Ongoing symptoms following recent treatment for presumed UTI rept culture Positive for infection needs treatment  with nitrofurantoin, will call in am , original result note inaccurate

## 2024-03-21 NOTE — Assessment & Plan Note (Signed)
 Controlled, no change in medication DASH diet and commitment to daily physical activity for a minimum of 30 minutes discussed and encouraged, as a part of hypertension management. The importance of attaining a healthy weight is also discussed.     03/13/2024   11:05 AM 02/25/2024    5:12 PM 02/25/2024    3:15 PM 02/25/2024   12:08 PM 01/31/2024    9:42 AM 11/25/2023    8:34 AM 11/22/2023    2:51 PM  BP/Weight  Systolic BP 134 120 129 132 118 115 120  Diastolic BP 78 80 80 76 76 72 74  Wt. (Lbs) 202.12   205 205 204.6 204  BMI 30.73 kg/m2   31.17 kg/m2 31.17 kg/m2 31.11 kg/m2 31.02 kg/m2

## 2024-03-28 ENCOUNTER — Other Ambulatory Visit: Payer: Self-pay | Admitting: Family Medicine

## 2024-03-30 ENCOUNTER — Ambulatory Visit: Payer: Medicare HMO | Admitting: Nurse Practitioner

## 2024-04-10 ENCOUNTER — Other Ambulatory Visit: Payer: Self-pay | Admitting: Family Medicine

## 2024-04-12 ENCOUNTER — Ambulatory Visit (INDEPENDENT_AMBULATORY_CARE_PROVIDER_SITE_OTHER): Admitting: Nurse Practitioner

## 2024-04-12 ENCOUNTER — Encounter: Payer: Self-pay | Admitting: Nurse Practitioner

## 2024-04-12 VITALS — BP 122/72 | HR 83 | Ht 68.0 in | Wt 201.4 lb

## 2024-04-12 DIAGNOSIS — E1122 Type 2 diabetes mellitus with diabetic chronic kidney disease: Secondary | ICD-10-CM

## 2024-04-12 DIAGNOSIS — Z794 Long term (current) use of insulin: Secondary | ICD-10-CM | POA: Diagnosis not present

## 2024-04-12 DIAGNOSIS — E782 Mixed hyperlipidemia: Secondary | ICD-10-CM | POA: Diagnosis not present

## 2024-04-12 DIAGNOSIS — N1832 Chronic kidney disease, stage 3b: Secondary | ICD-10-CM | POA: Diagnosis not present

## 2024-04-12 DIAGNOSIS — Z7985 Long-term (current) use of injectable non-insulin antidiabetic drugs: Secondary | ICD-10-CM | POA: Diagnosis not present

## 2024-04-12 DIAGNOSIS — I10 Essential (primary) hypertension: Secondary | ICD-10-CM

## 2024-04-12 LAB — POCT GLYCOSYLATED HEMOGLOBIN (HGB A1C): Hemoglobin A1C: 6.2 % — AB (ref 4.0–5.6)

## 2024-04-12 MED ORDER — SEMAGLUTIDE (2 MG/DOSE) 8 MG/3ML ~~LOC~~ SOPN: 2.0000 mg | PEN_INJECTOR | SUBCUTANEOUS | 3 refills | Status: DC

## 2024-04-12 NOTE — Progress Notes (Signed)
 Endocrinology follow-up note    04/12/2024, 9:35 AM   Subjective:    Patient ID: Norman Peters, male    DOB: August 08, 1956.  Yogesh Cominsky is being seen in follow-up after he was seen in consultation for management of currently uncontrolled symptomatic diabetes requested by  Towanda Fret, MD.   Past Medical History:  Diagnosis Date   Acute left-sided low back pain with left-sided sciatica 11/04/2020   Allergy    Anemia    Anxiety    Arthritis    Blood transfusion without reported diagnosis    Cataract    Chronic kidney disease    Depression 2001   Diabetes mellitus 2010   GERD (gastroesophageal reflux disease)    Hyperlipidemia    Hypertension 2005    Past Surgical History:  Procedure Laterality Date   COLON SURGERY     COLONOSCOPY N/A 07/07/2016   Procedure: COLONOSCOPY;  Surgeon: Ruby Corporal, MD;  Location: [facility];  Service: Endoscopy;  Laterality: N/A;  1030   FINGER SURGERY Left 1974   fifth , trauma on job, had to replace/repair finger   HERNIA REPAIR  2006   x3, umbilical and bilateral hernia    Social History   Socioeconomic History   Marital status: Married    Spouse name: Not on file   Number of children: Not on file   Years of education: Not on file   Highest education level: 10th grade  Occupational History   Not on file  Tobacco Use   Smoking status: Former    Current packs/day: 0.00    Average packs/day: 0.5 packs/day for 30.0 years (15.0 ttl pk-yrs)    Types: Cigarettes    Start date: 01/19/1981    Quit date: 01/20/2011    Years since quitting: 13.2    Passive exposure: Never   Smokeless tobacco: Never  Vaping Use   Vaping status: Never Used  Substance and Sexual Activity   Alcohol use: No   Drug use: No   Sexual activity: Not Currently  Other Topics Concern   Not on file  Social History Narrative   Not on file   Social Drivers of Health    Financial Resource Strain: Low Risk  (11/03/2023)   Overall Financial Resource Strain (CARDIA)    Difficulty of Paying Living Expenses: Not hard at all  Food Insecurity: No Food Insecurity (11/03/2023)   Hunger Vital Sign    Worried About Running Out of Food in the Last Year: Never true    Ran Out of Food in the Last Year: Never true  Transportation Needs: No Transportation Needs (11/03/2023)   PRAPARE - Administrator, Civil Service (Medical): No    Lack of Transportation (Non-Medical): No  Physical Activity: Insufficiently Active (11/03/2023)   Exercise Vital Sign    Days of Exercise per Week: 3 days    Minutes of Exercise per Session: 10 min  Stress: Stress Concern Present (11/03/2023)   [employer] of Occupational Health - Occupational Stress Questionnaire    Feeling of Stress : To some extent  Social Connections: Socially Integrated (11/03/2023)  Social Connection and Isolation Panel [NHANES]    Frequency of Communication with Friends and Family: Three times a week    Frequency of Social Gatherings with Friends and Family: Once a week    Attends Religious Services: More than 4 times per year    Active Member of [employer] or Organizations: Yes    Attends Engineer, structural: More than 4 times per year    Marital Status: Married    Family History  Problem Relation Age of Onset   Stroke Mother     Outpatient Encounter Medications as of 04/12/2024  Medication Sig   acetaminophen  (TYLENOL ) 325 MG tablet Take 650 mg by mouth every 6 (six) hours as needed.   albuterol  (VENTOLIN  HFA) 108 (90 Base) MCG/ACT inhaler Inhale 2 puffs into the lungs daily as needed, for wheezing and shortness of breath.   allopurinol  (ZYLOPRIM ) 300 MG tablet Take 1 tablet (300 mg total) by mouth daily.   amLODipine -olmesartan  (AZOR ) 10-40 MG tablet TAKE 1 TABLET EVERY DAY   aspirin EC 81 MG tablet Take 81 mg by mouth every morning.   azelastine  (ASTELIN ) 0.1 % nasal spray Place  2 sprays into both nostrils 2 (two) times daily. Use in each nostril as directed   busPIRone  (BUSPAR ) 5 MG tablet Take 1 tablet (5 mg total) by mouth 3 (three) times daily.   chlorpheniramine  (CHLOR-TRIMETON ) 4 MG tablet Take one tablet by mouth every morning fr 1 week, then as needed   chlorthalidone  (HYGROTON ) 25 MG tablet Take 1 tablet by mouth once daily   Cholecalciferol (VITAMIN D -3) 25 MCG (1000 UT) CAPS Take by mouth daily.   clotrimazole -betamethasone  (LOTRISONE ) cream Apply 1 Application topically 2 (two) times daily.   cyanocobalamin  1000 MCG tablet Take 1,000 mcg by mouth daily.   FLUoxetine  (PROZAC ) 40 MG capsule TAKE 1 CAPSULE EVERY DAY   Insulin  Pen Needle (PEN NEEDLES) 32G X 4 MM MISC Use to inject insulin  once daily   montelukast  (SINGULAIR ) 10 MG tablet Take 1 tablet (10 mg total) by mouth at bedtime.   POTASSIUM PO Take by mouth daily.   rosuvastatin  (CRESTOR ) 5 MG tablet TAKE 1 TABLET EVERY MONDAY, WEDNESDAY AND FRIDAY (DISCONTINUE PRAVASTATIN )   TRUE METRIX BLOOD GLUCOSE TEST test strip TEST BLOOD SUGAR ONE TIME DAILY AS DIRECTED   [DISCONTINUED] LANTUS  SOLOSTAR 100 UNIT/ML Solostar Pen INJECT 45 UNITS UNDER THE SKIN EVERY MORNING (DOSE INCREASE)   [DISCONTINUED] Semaglutide , 2 MG/DOSE, 8 MG/3ML SOPN Inject 2 mg as directed once a week.   penicillin  v potassium (VEETID) 500 MG tablet Take 1 tablet (500 mg total) by mouth 3 (three) times daily. (Patient not taking: Reported on 04/12/2024)   Semaglutide , 2 MG/DOSE, 8 MG/3ML SOPN Inject 2 mg as directed once a week.   No facility-administered encounter medications on file as of 04/12/2024.    ALLERGIES: Allergies  Allergen Reactions   Ibuprofen Rash   Lisinopril Cough   Strawberry [Berry] Hives and Swelling    TONGUE SWELLING    VACCINATION STATUS: Immunization History  Administered Date(s) Administered   Fluad Quad(high Dose 65+) 09/02/2020, 07/17/2021, 09/15/2022   Fluad Trivalent(High Dose 65+) 07/05/2023    Influenza,inj,Quad PF,6+ Mos 07/14/2016, 08/25/2017, 08/30/2018, 07/02/2019   MODERNA COVID-19 SARS-COV-2 PEDS BIVALENT BOOSTER 54yr-90yr 07/25/2023   Moderna Covid-19 Vaccine Bivalent Booster 27yrs & up 10/01/2022   PFIZER(Purple Top)SARS-COV-2 Vaccination 01/25/2020, 02/19/2020, 09/28/2020, 05/29/2021, 09/25/2021   PNEUMOCOCCAL CONJUGATE-20 07/17/2021   Pfizer Covid-19 Vaccine Bivalent Booster 35yrs & up 09/25/2021   Pneumococcal  Conjugate-13 10/08/2022   Pneumococcal Polysaccharide-23 02/10/2016   Rsv, Bivalent, Protein Subunit Rsvpref,pf Pattricia Bores) 10/08/2022   Tdap 07/04/2021   Zoster Recombinant(Shingrix) 07/08/2021, 09/09/2021    Diabetes He presents for his follow-up diabetic visit. He has type 2 diabetes mellitus. Onset time: He was diagnosed at approximate age of 48 years. His disease course has been improving. Pertinent negatives for hypoglycemia include no confusion, headaches, pallor or seizures. Associated symptoms include fatigue. Pertinent negatives for diabetes include no blurred vision, no chest pain, no polydipsia, no polyphagia, no polyuria, no weakness and no weight loss. There are no hypoglycemic complications. Pertinent negatives for hypoglycemia complications include no required assistance (went to ED for hypoglycemia since last visit). Symptoms are improving. Diabetic complications include nephropathy and peripheral neuropathy. Risk factors for coronary artery disease include diabetes mellitus, dyslipidemia, obesity, hypertension, male sex, tobacco exposure and sedentary lifestyle. Current diabetic treatment includes insulin  injections (and Ozempic ). He is compliant with treatment most of the time. His weight is decreasing steadily. He is following a generally healthy diet. When asked about meal planning, he reported none. He has had a previous visit with a dietitian. He rarely participates in exercise. His home blood glucose trend is decreasing steadily. His overall blood  glucose range is 130-140 mg/dl. (He presents today with his CGM and logs, at goal glycemic profile overall.  His POCT A1c today is 6.2%, improving from last visit of 6.9%.   Analysis of his CGM shows TIR 94%, TAR 6%, TBR 0% with a GMI of 6.5%.  He generally feels very well.) An ACE inhibitor/angiotensin II receptor blocker is not being taken. He does not see a podiatrist.Eye exam is current.  Hyperlipidemia This is a chronic problem. The current episode started more than 1 year ago. The problem is uncontrolled. Recent lipid tests were reviewed and are variable. Exacerbating diseases include chronic renal disease, diabetes and obesity. Factors aggravating his hyperlipidemia include fatty foods. Pertinent negatives include no chest pain, myalgias or shortness of breath. Current antihyperlipidemic treatment includes statins. The current treatment provides mild improvement of lipids. Compliance problems include adherence to exercise and adherence to diet.  Risk factors for coronary artery disease include diabetes mellitus, dyslipidemia, male sex, obesity, hypertension and a sedentary lifestyle.  Hypertension This is a chronic problem. The current episode started more than 1 year ago. The problem has been resolved since onset. The problem is controlled. Pertinent negatives include no blurred vision, chest pain, headaches, neck pain, palpitations or shortness of breath. There are no associated agents to hypertension. Risk factors for coronary artery disease include diabetes mellitus, male gender, smoking/tobacco exposure, sedentary lifestyle, obesity and dyslipidemia. Past treatments include calcium  channel blockers and diuretics. The current treatment provides mild improvement. There are no compliance problems.  Hypertensive end-organ damage includes kidney disease. Identifiable causes of hypertension include chronic renal disease.    Review of systems  Constitutional: + slowly decreasing body weight,  current  Body mass index is 30.62 kg/m. , no fatigue, no subjective hyperthermia, no subjective hypothermia Eyes: no blurry vision, no xerophthalmia ENT: no sore throat, no nodules palpated in throat, no dysphagia/odynophagia, no hoarseness Cardiovascular: no chest pain, no shortness of breath, no palpitations, no leg swelling Respiratory: no cough, no shortness of breath Gastrointestinal: no nausea/vomiting/diarrhea Musculoskeletal: no muscle/joint aches Skin: no rashes, no hyperemia Neurological: no tremors, no numbness, no tingling, no dizziness Psychiatric: no depression, no anxiety   Objective:    BP 122/72 (BP Location: Right Arm, Patient Position: Sitting, Cuff Size: Large)  Pulse 83   Ht 5\' 8"  (1.727 m)   Wt 201 lb 6.4 oz (91.4 kg)   BMI 30.62 kg/m   Wt Readings from Last 3 Encounters:  04/12/24 201 lb 6.4 oz (91.4 kg)  03/13/24 202 lb 1.9 oz (91.7 kg)  02/25/24 205 lb (93 kg)    BP Readings from Last 3 Encounters:  04/12/24 122/72  03/13/24 134/78  02/25/24 120/80     Physical Exam- Limited  Constitutional:  Body mass index is 30.62 kg/m. , not in acute distress, normal state of mind Eyes:  EOMI, no exophthalmos Musculoskeletal: no gross deformities, strength intact in all four extremities, no gross restriction of joint movements Skin:  no rashes, no hyperemia Neurological: no tremor with outstretched hands  Diabetic Foot Exam - Simple   No data filed     CMP     Component Value Date/Time   NA 138 03/13/2024 1148   K 4.0 03/13/2024 1148   CL 99 03/13/2024 1148   CO2 23 03/13/2024 1148   GLUCOSE 75 03/13/2024 1148   GLUCOSE 130 (H) 02/25/2024 1240   BUN 21 03/13/2024 1148   CREATININE 1.71 (H) 03/13/2024 1148   CREATININE 1.47 (H) 08/25/2020 0927   CALCIUM  10.1 03/13/2024 1148   PROT 7.3 03/13/2024 1148   ALBUMIN 4.5 03/13/2024 1148   AST 14 03/13/2024 1148   ALT 12 03/13/2024 1148   ALKPHOS 101 03/13/2024 1148   BILITOT 0.6 03/13/2024 1148   GFRNONAA  39 (L) 02/25/2024 1240   GFRNONAA 50 (L) 08/25/2020 0927   GFRAA 61 12/10/2020 0933   GFRAA 58 (L) 08/25/2020 0927     Diabetic Labs (most recent): Lab Results  Component Value Date   HGBA1C 6.2 (A) 04/12/2024   HGBA1C 6.9 (A) 11/25/2023   HGBA1C 7.3 (A) 07/26/2023   MICROALBUR 150 03/25/2022   MICROALBUR 150 03/16/2021   MICROALBUR 188.1 (H) 01/07/2020     Lipid Panel     Component Value Date/Time   CHOL 105 11/03/2023 0957   TRIG 182 (H) 11/03/2023 0957   HDL 37 (L) 11/03/2023 0957   CHOLHDL 2.8 11/03/2023 0957   CHOLHDL 3.4 08/25/2020 0927   VLDL 39 (H) 05/14/2017 0831   LDLCALC 38 11/03/2023 0957   LDLCALC 55 08/25/2020 0927   LABVLDL 30 11/03/2023 0957      Lab Results  Component Value Date   TSH 2.150 11/03/2023   TSH 1.300 03/04/2022   TSH 2.215 07/22/2021   TSH 2.100 06/10/2021   TSH 2.79 08/25/2020   TSH 1.96 08/24/2019   TSH 1.34 01/31/2018   TSH 1.61 02/10/2016   FREET4 1.14 06/10/2021   FREET4 1.0 08/25/2020     Assessment & Plan:   1) Type 2 diabetes mellitus with stage 3a chronic kidney disease, with long-term current use of insulin  (HCC)  - Zion Ta has currently uncontrolled symptomatic type 2 DM since 68 years of age.  He presents today with his CGM and logs, at goal glycemic profile overall.  His POCT A1c today is 6.2%, improving from last visit of 6.9%.   Analysis of his CGM shows TIR 94%, TAR 6%, TBR 0% with a GMI of 6.5%.  He generally feels very well.   Recent labs reviewed.  - I had a long discussion with him about the progressive nature of diabetes and the pathology behind its complications. -his diabetes is complicated by CKD and he remains at a high risk for more acute and chronic complications which include CAD,  CVA, CKD, retinopathy, and neuropathy. These are all discussed in detail with him.  - Nutritional counseling repeated at each appointment due to patients tendency to fall back in to old habits.  - The patient admits  there is a room for improvement in their diet and drink choices. -  Suggestion is made for the patient to avoid simple carbohydrates from their diet including Cakes, Sweet Desserts / Pastries, Ice Cream, Soda (diet and regular), Sweet Tea, Candies, Chips, Cookies, Sweet Pastries, Store Bought Juices, Alcohol in Excess of 1-2 drinks a day, Artificial Sweeteners, Coffee Creamer, and "Sugar-free" Products. This will help patient to have stable blood glucose profile and potentially avoid unintended weight gain.   - I encouraged the patient to switch to unprocessed or minimally processed complex starch and increased protein intake (animal or plant source), fruits, and vegetables.   - Patient is advised to stick to a routine mealtimes to eat 3 meals a day and avoid unnecessary snacks (to snack only to correct hypoglycemia).  - I have approached him with the following individualized plan to manage his diabetes and patient agrees:   - he will continue to benefit from basal insulin .    -He is advised to continue Ozempic  2 mg SQ weekly and STOP his Lantus  altogether at this time.  -He is encouraged to continue monitoring blood glucose twice daily (using his CGM), before breakfast and before bed, and to call the clinic if he has readings less than 70 or greater than 200 for 3 tests in a row.   - he is warned not to take insulin  without proper monitoring per orders.  -He reports that he had developed unexplained cough while taking Glipizide  and Metformin .  After he finished his Glipizide  and Metformin , reportedly the cough has improved.  He is advised to stay off of Glipizide  and Metformin  for now.   - Specific targets for  A1c;  LDL, HDL,  and Triglycerides were discussed with the patient.  2) Blood Pressure /Hypertension:  His blood pressure is at target today.  he is advised to continue his current medications including Amlodipine /Olmesartan  10-20 mg p.o. daily.  3) Lipids/Hyperlipidemia:   His most  recent lipid panel from 11/03/23 shows controlled LDL of 38 and elevated triglycerides of 182.  He is advised to continue Crestor  5 mg po every other day at bedtime and Fenofibrate  48 mg po daily.  Side effects and precautions discussed with him.   4)  Weight/Diet:  His Body mass index is 30.62 kg/m.  -   clearly complicating his diabetes care.   he is a candidate for weight loss. I discussed with him the fact that loss of 5 - 10% of his  current body weight will have the most impact on his diabetes management.  Exercise, and detailed carbohydrates information provided  -  detailed on discharge instructions.  5) Chronic Care/Health Maintenance: -he is on a Statin medications and is encouraged to initiate and continue to follow up with Ophthalmology, Dentist, Podiatrist at least yearly or according to recommendations, and advised to stay away from smoking. I have recommended yearly flu vaccine and pneumonia vaccine at least every 5 years; moderate intensity exercise for up to 150 minutes weekly; and  sleep for at least 7 hours a day.  - he is advised to maintain close follow up with Towanda Fret, MD for primary care needs, as well as his other providers for optimal and coordinated care.     I spent  27  minutes in the care of the patient today including review of labs from CMP, Lipids, Thyroid  Function, Hematology (current and previous including abstractions from other facilities); face-to-face time discussing  his blood glucose readings/logs, discussing hypoglycemia and hyperglycemia episodes and symptoms, medications doses, his options of short and long term treatment based on the latest standards of care / guidelines;  discussion about incorporating lifestyle medicine;  and documenting the encounter. Risk reduction counseling performed per USPSTF guidelines to reduce obesity and cardiovascular risk factors.     Please refer to Patient Instructions for Blood Glucose Monitoring and  Insulin /Medications Dosing Guide"  in media tab for additional information. Please  also refer to " Patient Self Inventory" in the Media  tab for reviewed elements of pertinent patient history.  Malissa Se participated in the discussions, expressed understanding, and voiced agreement with the above plans.  All questions were answered to his satisfaction. he is encouraged to contact clinic should he have any questions or concerns prior to his return visit.   Follow up plan: - Return in about 6 months (around 10/12/2024) for Diabetes F/U with A1c in office, No previsit labs.  Hulon Magic, [facility] [address] [place], [place] [address] Phone: [phone] Fax: [phone]  04/12/2024, 9:35 AM

## 2024-04-13 ENCOUNTER — Other Ambulatory Visit: Payer: Self-pay | Admitting: Family Medicine

## 2024-04-26 ENCOUNTER — Encounter: Payer: Medicare HMO | Admitting: Family Medicine

## 2024-04-30 ENCOUNTER — Ambulatory Visit (INDEPENDENT_AMBULATORY_CARE_PROVIDER_SITE_OTHER)

## 2024-04-30 VITALS — Ht 68.0 in | Wt 205.0 lb

## 2024-04-30 DIAGNOSIS — Z0000 Encounter for general adult medical examination without abnormal findings: Secondary | ICD-10-CM

## 2024-04-30 NOTE — Progress Notes (Signed)
 Subjective:   Norman Peters is a 68 y.o. who presents for a Medicare Wellness preventive visit.  As a reminder, Annual Wellness Visits don't include a physical exam, and some assessments may be limited, especially if this visit is performed virtually. We may recommend an in-person follow-up visit with your provider if needed.  Visit Complete: Virtual I connected with  Ryan Seip on 04/30/24 by a audio enabled telemedicine application and verified that I am speaking with the correct person using two identifiers.  Patient Location: Home  Provider Location: Office/Clinic  I discussed the limitations of evaluation and management by telemedicine. The patient expressed understanding and agreed to proceed.  Vital Signs: Because this visit was a virtual/telehealth visit, some criteria may be missing or patient reported. Any vitals not documented were not able to be obtained and vitals that have been documented are patient reported.  VideoDeclined- This patient declined Librarian, academic. Therefore the visit was completed with audio only.  Persons Participating in Visit: Patient.  AWV Questionnaire: Yes: Patient Medicare AWV questionnaire was completed by the patient on 04/26/2024; I have confirmed that all information answered by patient is correct and no changes since this date.  Cardiac Risk Factors include: advanced age (>73men, >28 women);diabetes mellitus;dyslipidemia;hypertension;male gender;obesity (BMI >30kg/m2);sedentary lifestyle     Objective:    Today's Vitals   04/26/24 1245 04/30/24 1512  Weight:  205 lb (93 kg)  Height:  5' 8 (1.727 m)  PainSc: 7     Body mass index is 31.17 kg/m.     04/30/2024    3:14 PM 02/25/2024   12:09 PM 08/31/2023    8:15 AM 04/27/2023   10:52 AM 12/15/2022    2:06 PM 10/23/2022   12:20 AM 09/11/2022    9:45 PM  Advanced Directives  Does Patient Have a Medical Advance Directive? No No No Yes No No No  Does  patient want to make changes to medical advance directive?    Yes (MAU/Ambulatory/Procedural Areas - Information given)     Would patient like information on creating a medical advance directive? Yes (MAU/Ambulatory/Procedural Areas - Information given) No - Patient declined No - Patient declined  No - Patient declined No - Patient declined Yes (ED - Information included in AVS)    Current Medications (verified) Outpatient Encounter Medications as of 04/30/2024  Medication Sig   acetaminophen  (TYLENOL ) 325 MG tablet Take 650 mg by mouth every 6 (six) hours as needed.   albuterol  (VENTOLIN  HFA) 108 (90 Base) MCG/ACT inhaler Inhale 2 puffs into the lungs daily as needed, for wheezing and shortness of breath.   allopurinol  (ZYLOPRIM ) 300 MG tablet Take 1 tablet (300 mg total) by mouth daily.   amLODipine -olmesartan  (AZOR ) 10-40 MG tablet TAKE 1 TABLET EVERY DAY   aspirin EC 81 MG tablet Take 81 mg by mouth every morning.   azelastine  (ASTELIN ) 0.1 % nasal spray Place 2 sprays into both nostrils 2 (two) times daily. Use in each nostril as directed   busPIRone  (BUSPAR ) 5 MG tablet Take 1 tablet (5 mg total) by mouth 3 (three) times daily.   chlorpheniramine  (CHLOR-TRIMETON ) 4 MG tablet Take one tablet by mouth every morning fr 1 week, then as needed   chlorthalidone  (HYGROTON ) 25 MG tablet Take 1 tablet by mouth once daily   Cholecalciferol (VITAMIN D -3) 25 MCG (1000 UT) CAPS Take by mouth daily.   clotrimazole -betamethasone  (LOTRISONE ) cream Apply 1 Application topically 2 (two) times daily.   cyanocobalamin  1000 MCG tablet  Take 1,000 mcg by mouth daily.   FLUoxetine  (PROZAC ) 40 MG capsule TAKE 1 CAPSULE EVERY DAY   Insulin  Pen Needle (PEN NEEDLES) 32G X 4 MM MISC Use to inject insulin  once daily   montelukast  (SINGULAIR ) 10 MG tablet Take 1 tablet (10 mg total) by mouth at bedtime.   penicillin  v potassium (VEETID) 500 MG tablet Take 1 tablet (500 mg total) by mouth 3 (three) times daily.    POTASSIUM PO Take by mouth daily.   rosuvastatin  (CRESTOR ) 5 MG tablet TAKE 1 TABLET EVERY MONDAY, WEDNESDAY AND FRIDAY (DISCONTINUE PRAVASTATIN )   Semaglutide , 2 MG/DOSE, 8 MG/3ML SOPN Inject 2 mg as directed once a week.   TRUE METRIX BLOOD GLUCOSE TEST test strip TEST BLOOD SUGAR ONE TIME DAILY AS DIRECTED   No facility-administered encounter medications on file as of 04/30/2024.    Allergies (verified) Ibuprofen, Lisinopril, and Strawberry [berry]   History: Past Medical History:  Diagnosis Date   Acute left-sided low back pain with left-sided sciatica 11/04/2020   Allergy    Anemia    Anxiety    Arthritis    Blood transfusion without reported diagnosis    Cataract    Chronic kidney disease    Depression 2001   Diabetes mellitus 2010   GERD (gastroesophageal reflux disease)    Hyperlipidemia    Hypertension 2005   Past Surgical History:  Procedure Laterality Date   COLON SURGERY     COLONOSCOPY N/A 07/07/2016   Procedure: COLONOSCOPY;  Surgeon: Claudis RAYMOND Rivet, MD;  Location: [facility];  Service: Endoscopy;  Laterality: N/A;  1030   FINGER SURGERY Left 1974   fifth , trauma on job, had to replace/repair finger   HERNIA REPAIR  2006   x3, umbilical and bilateral hernia   Family History  Problem Relation Age of Onset   Stroke Mother    Social History   Socioeconomic History   Marital status: Married    Spouse name: Not on file   Number of children: Not on file   Years of education: Not on file   Highest education level: 10th grade  Occupational History   Not on file  Tobacco Use   Smoking status: Former    Current packs/day: 0.00    Average packs/day: 0.5 packs/day for 30.0 years (15.0 ttl pk-yrs)    Types: Cigarettes    Start date: 01/19/1981    Quit date: 01/20/2011    Years since quitting: 13.2    Passive exposure: Never   Smokeless tobacco: Never  Vaping Use   Vaping status: Never Used  Substance and Sexual Activity   Alcohol use: No   Drug  use: No   Sexual activity: Not Currently  Other Topics Concern   Not on file  Social History Narrative   Not on file   Social Drivers of Health   Financial Resource Strain: Low Risk  (04/26/2024)   Overall Financial Resource Strain (CARDIA)    Difficulty of Paying Living Expenses: Not hard at all  Food Insecurity: No Food Insecurity (04/26/2024)   Hunger Vital Sign    Worried About Running Out of Food in the Last Year: Never true    Ran Out of Food in the Last Year: Never true  Transportation Needs: No Transportation Needs (04/26/2024)   PRAPARE - Administrator, Civil Service (Medical): No    Lack of Transportation (Non-Medical): No  Physical Activity: Insufficiently Active (04/26/2024)   Exercise Vital Sign    Days  of Exercise per Week: 1 day    Minutes of Exercise per Session: 40 min  Stress: No Stress Concern Present (04/26/2024)   [employer] of Occupational Health - Occupational Stress Questionnaire    Feeling of Stress: Not at all  Social Connections: Socially Integrated (04/26/2024)   Social Connection and Isolation Panel    Frequency of Communication with Friends and Family: More than three times a week    Frequency of Social Gatherings with Friends and Family: Three times a week    Attends Religious Services: More than 4 times per year    Active Member of Clubs or Organizations: Yes    Attends Engineer, structural: More than 4 times per year    Marital Status: Married    Tobacco Counseling Counseling given: Yes    Clinical Intake:  Pre-visit preparation completed: Yes  Pain : 0-10 Pain Score: 7  Pain Type: Chronic pain Pain Location: Knee Pain Orientation: Right, Left Pain Descriptors / Indicators: Constant Pain Onset: More than a month ago Pain Frequency: Constant     BMI - recorded: 31.17 Nutritional Risks: None Diabetes: Yes CBG done?: No (telehealth visit.)  Lab Results  Component Value Date   HGBA1C 6.2 (A) 04/12/2024    HGBA1C 6.9 (A) 11/25/2023   HGBA1C 7.3 (A) 07/26/2023     How often do you need to have someone help you when you read instructions, pamphlets, or other written materials from your doctor or pharmacy?: 1 - Never  Interpreter Needed?: No  Information entered by :: Stefano ORN CMA   Activities of Daily Living     04/26/2024   12:45 PM 04/23/2024    8:31 PM  In your present state of health, do you have any difficulty performing the following activities:  Hearing? 0 0  Vision? 0 0  Difficulty concentrating or making decisions? 0 0  Walking or climbing stairs? 0 0  Dressing or bathing? 0 0  Doing errands, shopping? 0 0  Preparing Food and eating ? N N  Using the Toilet? N N  In the past six months, have you accidently leaked urine? N N  Do you have problems with loss of bowel control? N N  Managing your Medications? N N  Managing your Finances? N N  Housekeeping or managing your Housekeeping? N N    Patient Care Team: Antonetta Rollene BRAVO, MD as PCP - General (Family Medicine) Rogers Hai, MD as Medical Oncologist (Hematology) Debera Jayson MATSU, MD as Consulting Physician (Cardiology) Darroll Anes, DO as Consulting Physician (Optometry) [facility]  as Consulting Physician (Optometry) Therisa Benton PARAS, NP as Nurse Practitioner (Endocrinology) Margrette Taft BRAVO, MD as Consulting Physician (Orthopedic Surgery)  I have updated your Care Teams any recent Medical Services you may have received from other providers in the past year.     Assessment:   This is a routine wellness examination for Mostyn.  Hearing/Vision screen Hearing Screening - Comments:: Patient denies any hearing difficulties.   Vision Screening - Comments:: Wears rx glasses - office has changed his appointment 3 times Sees [facility] [place] location    Goals Addressed             This Visit's Progress    Patient Stated   On track    Would like to get A1C  down <7  and lose weight        Depression Screen     04/30/2024    3:15 PM 03/13/2024  11:07 AM 01/31/2024    9:52 AM 11/22/2023    2:52 PM 07/05/2023    3:01 PM 05/11/2023    8:32 AM 04/27/2023   10:54 AM  PHQ 2/9 Scores  PHQ - 2 Score 0 0 2 0 2 0 0  PHQ- 9 Score 0 1 3  5 1      Fall Risk     04/26/2024   12:45 PM 04/23/2024    8:31 PM 03/13/2024   11:06 AM 01/31/2024    9:52 AM 11/22/2023    2:52 PM  Fall Risk   Falls in the past year? 0 0 0 0 0  Number falls in past yr: 0 0 0 0 0  Injury with Fall? 0 0 0 0 0  Risk for fall due to : No Fall Risks   No Fall Risks No Fall Risks  Follow up Falls evaluation completed;Education provided;Falls prevention discussed  Falls evaluation completed Falls evaluation completed Falls evaluation completed    MEDICARE RISK AT HOME:  Medicare Risk at Home Any stairs in or around the home?: (Patient-Rptd) Yes If so, are there any without handrails?: (Patient-Rptd) No Home free of loose throw rugs in walkways, pet beds, electrical cords, etc?: (Patient-Rptd) Yes Adequate lighting in your home to reduce risk of falls?: (Patient-Rptd) Yes Life alert?: (Patient-Rptd) No Use of a cane, walker or w/c?: (Patient-Rptd) Yes Grab bars in the bathroom?: (Patient-Rptd) Yes Shower chair or bench in shower?: (Patient-Rptd) No Elevated toilet seat or a handicapped toilet?: (Patient-Rptd) No  TIMED UP AND GO:  Was the test performed?  No  Cognitive Function: 6CIT completed    04/22/2022   10:41 AM  MMSE - Mini Mental State Exam  Not completed: Unable to complete        04/30/2024    3:15 PM 04/27/2023   10:54 AM 04/22/2022   10:41 AM 04/03/2021   10:31 AM 04/01/2020    8:50 AM  6CIT Screen  What Year? 0 points 0 points 0 points 0 points 0 points  What month? 0 points 0 points 0 points 0 points 0 points  What time? 0 points 0 points 0 points 0 points 0 points  Count back from 20 0 points 0 points 0 points 0 points 0 points  Months in reverse 0 points  4 points 0 points 0 points 4 points  Repeat phrase 0 points 4 points 0 points 0 points 0 points  Total Score 0 points 8 points 0 points 0 points 4 points    Immunizations Immunization History  Administered Date(s) Administered   Fluad Quad(high Dose 65+) 09/02/2020, 07/17/2021, 09/15/2022   Fluad Trivalent(High Dose 65+) 07/05/2023   Influenza,inj,Quad PF,6+ Mos 07/14/2016, 08/25/2017, 08/30/2018, 07/02/2019   MODERNA COVID-19 SARS-COV-2 PEDS BIVALENT BOOSTER 73yr-87yr 07/25/2023   Moderna Covid-19 Vaccine Bivalent Booster 61yrs & up 10/01/2022   PFIZER(Purple Top)SARS-COV-2 Vaccination 01/25/2020, 02/19/2020, 09/28/2020, 05/29/2021, 09/25/2021   PNEUMOCOCCAL CONJUGATE-20 07/17/2021   Pfizer Covid-19 Vaccine Bivalent Booster 40yrs & up 09/25/2021   Pneumococcal Conjugate-13 10/08/2022   Pneumococcal Polysaccharide-23 02/10/2016   Rsv, Bivalent, Protein Subunit Rsvpref,pf [place]) 10/08/2022   Tdap 07/04/2021   Zoster Recombinant(Shingrix) 07/08/2021, 09/09/2021    Screening Tests Health Maintenance  Topic Date Due   COVID-19 Vaccine (8 - 2024-25 season) 09/19/2023   Diabetic kidney evaluation - Urine ACR  04/21/2024   FOOT EXAM  04/19/2024   OPHTHALMOLOGY EXAM  04/20/2024   INFLUENZA VACCINE  06/08/2024   HEMOGLOBIN A1C  10/12/2024   Diabetic kidney  evaluation - eGFR measurement  03/13/2025   Medicare Annual Wellness (AWV)  04/30/2025   Colonoscopy  07/07/2026   DTaP/Tdap/Td (2 - Td or Tdap) 07/05/2031   Pneumococcal Vaccine: 50+ Years  Completed   Hepatitis C Screening  Completed   Zoster Vaccines- Shingrix  Completed   HPV VACCINES  Aged Out   Meningococcal B Vaccine  Aged Out    Health Maintenance  Health Maintenance Due  Topic Date Due   COVID-19 Vaccine (8 - 2024-25 season) 09/19/2023   Diabetic kidney evaluation - Urine ACR  04/21/2024   FOOT EXAM  04/19/2024   OPHTHALMOLOGY EXAM  04/20/2024   Health Maintenance Items Addressed: DM foot exam due. Patient has  had an eye exam scheduled and his eye care provider has rescheduled it three different times.   Additional Screening:  Vision Screening: Recommended annual ophthalmology exams for early detection of glaucoma and other disorders of the eye. Would you like a referral to an eye doctor? No    Dental Screening: Recommended annual dental exams for proper oral hygiene  Community Resource Referral / Chronic Care Management: CRR required this visit?  No   CCM required this visit?  No   Plan:    I have personally reviewed and noted the following in the patient's chart:   Medical and social history Use of alcohol, tobacco or illicit drugs  Current medications and supplements including opioid prescriptions. Patient is not currently taking opioid prescriptions. Functional ability and status Nutritional status Physical activity Advanced directives List of other physicians Hospitalizations, surgeries, and ER visits in previous 12 months Vitals Screenings to include cognitive, depression, and falls Referrals and appointments  In addition, I have reviewed and discussed with patient certain preventive protocols, quality metrics, and best practice recommendations. A written personalized care plan for preventive services as well as general preventive health recommendations were provided to patient.   Caige Almeda, CMA   04/30/2024   After Visit Summary: (MyChart) Due to this being a telephonic visit, the after visit summary with patients personalized plan was offered to patient via MyChart   Notes: Nothing significant to report at this time.

## 2024-04-30 NOTE — Patient Instructions (Addendum)
 [name] ,  Thank you for taking time out of your busy schedule to complete your Annual Wellness Visit with me. I enjoyed our conversation and look forward to speaking with you again next year. I, as well as your care team,  appreciate your ongoing commitment to your health goals. Please review the following plan we discussed and let me know if I can assist you in the future.  I enjoyed our conversation and look forward to it again next year. Blessing for the upcoming year!!  -Jhade Berko  Your Game plan/ To Do List    Referrals Placed:  Dr. Antonetta referred you to a kidney doctor in May. Below is his information Dr. Rachele (kidney doctor) Phone [phone]  Follow up Visits: Medicare AWV with health advisor: May 01, 2025 at 3:50 pm telephone     Next appointment with PCP: May 01, 2024 at 3:00 pm in office  Clinician Recommendations:  Aim for 30 minutes of exercise or brisk walking, 6-8 glasses of water , and 5 servings of fruits and vegetables each day.       This is a list of the screening recommended for you and due dates:  Health Maintenance  Topic Date Due   COVID-19 Vaccine (8 - 2024-25 season) 09/19/2023   Yearly kidney health urinalysis for diabetes  04/21/2024   Complete foot exam   04/19/2024   Eye exam for diabetics  04/20/2024   Flu Shot  06/08/2024   Hemoglobin A1C  10/12/2024   Yearly kidney function blood test for diabetes  03/13/2025   Medicare Annual Wellness Visit  04/30/2025   Colon Cancer Screening  07/07/2026   DTaP/Tdap/Td vaccine (2 - Td or Tdap) 07/05/2031   Pneumococcal Vaccine for age over 59  Completed   Hepatitis C Screening  Completed   Zoster (Shingles) Vaccine  Completed   HPV Vaccine  Aged Out   Meningitis B Vaccine  Aged Out    Advanced directives: (Provided) Advance directive discussed with you today. I have provided a copy for you to complete at home and have notarized. Once this is complete, please bring a copy in to our office so we can scan  it into your chart.  Advance Care Planning is important because it:  [x]  Makes sure you receive the medical care that is consistent with your values, goals, and preferences  [x]  It provides guidance to your family and loved ones and reduces their decisional burden about whether or not they are making the right decisions based on your wishes.  Follow the link provided in your after visit summary or read over the paperwork we have mailed to you to help you started getting your Advance Directives in place. If you need assistance in completing these, please reach out to us  so that we can help you!  Understanding Your Risk for Falls Millions of people have serious injuries from falls each year. It is important to understand your risk of falling. Talk with your health care provider about your risk and what you can do to lower it. If you do have a serious fall, make sure to tell your provider. Falling once raises your risk of falling again. How can falls affect me? Serious injuries from falls are common. These include: Broken bones, such as hip fractures. Head injuries, such as traumatic brain injuries (TBI) or concussions. A fear of falling can cause you to avoid activities and stay at home. This can make your muscles weaker and raise your risk for a fall.  What can increase my risk? There are a number of risk factors that increase your risk for falling. The more risk factors you have, the higher your risk of falling. Serious injuries from a fall happen most often to people who are older than 68 years old. Teenagers and young adults ages 70-29 are also at higher risk. Common risk factors include: Weakness in the lower body. Being generally weak or confused due to long-term (chronic) illness. Dizziness or balance problems. Poor vision. Medicines that cause dizziness or drowsiness. These may include: Medicines for your blood pressure, heart, anxiety, insomnia, or swelling (edema). Pain  medicines. Muscle relaxants. Other risk factors include: Drinking alcohol. Having had a fall in the past. Having foot pain or wearing improper footwear. Working at a dangerous job. Having any of the following in your home: Tripping hazards, such as floor clutter or loose rugs. Poor lighting. Pets. Having dementia or memory loss. What actions can I take to lower my risk of falling?     Physical activity Stay physically fit. Do strength and balance exercises. Consider taking a regular class to build strength and balance. Yoga and tai chi are good options. Vision Have your eyes checked every year and your prescription for glasses or contacts updated as needed. Shoes and walking aids Wear non-skid shoes. Wear shoes that have rubber soles and low heels. Do not wear high heels. Do not walk around the house in socks or slippers. Use a cane or walker as told by your provider. Home safety Attach secure railings on both sides of your stairs. Install grab bars for your bathtub, shower, and toilet. Use a non-skid mat in your bathtub or shower. Attach bath mats securely with double-sided, non-slip rug tape. Use good lighting in all rooms. Keep a flashlight near your bed. Make sure there is a clear path from your bed to the bathroom. Use night-lights. Do not use throw rugs. Make sure all carpeting is taped or tacked down securely. Remove all clutter from walkways and stairways, including extension cords. Repair uneven or broken steps and floors. Avoid walking on icy or slippery surfaces. Walk on the grass instead of on icy or slick sidewalks. Use ice melter to get rid of ice on walkways in the winter. Use a cordless phone. Questions to ask your health care provider Can you help me check my risk for a fall? Do any of my medicines make me more likely to fall? Should I take a vitamin D  supplement? What exercises can I do to improve my strength and balance? Should I make an appointment to have  my vision checked? Do I need a bone density test to check for weak bones (osteoporosis)? Would it help to use a cane or a walker? Where to find more information Centers for Disease Control and Prevention, STEADI: TonerPromos.no Community-Based Fall Prevention Programs: TonerPromos.no [employer] on Aging: BaseRingTones.pl Contact a health care provider if: You fall at home. You are afraid of falling at home. You feel weak, drowsy, or dizzy. This information is not intended to replace advice given to you by your health care provider. Make sure you discuss any questions you have with your health care provider. Document Revised: 06/28/2022 Document Reviewed: 06/28/2022 Elsevier Patient Education  2024 [employer].

## 2024-05-01 ENCOUNTER — Ambulatory Visit: Payer: Medicare HMO | Admitting: Family Medicine

## 2024-05-01 ENCOUNTER — Encounter: Payer: Self-pay | Admitting: Family Medicine

## 2024-05-01 VITALS — BP 119/74 | HR 88 | Resp 16 | Ht 68.0 in | Wt 203.1 lb

## 2024-05-01 DIAGNOSIS — M25561 Pain in right knee: Secondary | ICD-10-CM | POA: Diagnosis not present

## 2024-05-01 DIAGNOSIS — G8929 Other chronic pain: Secondary | ICD-10-CM

## 2024-05-01 DIAGNOSIS — F32A Depression, unspecified: Secondary | ICD-10-CM | POA: Diagnosis not present

## 2024-05-01 DIAGNOSIS — E1140 Type 2 diabetes mellitus with diabetic neuropathy, unspecified: Secondary | ICD-10-CM | POA: Diagnosis not present

## 2024-05-01 DIAGNOSIS — I10 Essential (primary) hypertension: Secondary | ICD-10-CM

## 2024-05-01 DIAGNOSIS — E790 Hyperuricemia without signs of inflammatory arthritis and tophaceous disease: Secondary | ICD-10-CM | POA: Diagnosis not present

## 2024-05-01 DIAGNOSIS — F419 Anxiety disorder, unspecified: Secondary | ICD-10-CM | POA: Diagnosis not present

## 2024-05-01 DIAGNOSIS — E785 Hyperlipidemia, unspecified: Secondary | ICD-10-CM | POA: Diagnosis not present

## 2024-05-01 DIAGNOSIS — E1169 Type 2 diabetes mellitus with other specified complication: Secondary | ICD-10-CM

## 2024-05-01 DIAGNOSIS — E538 Deficiency of other specified B group vitamins: Secondary | ICD-10-CM | POA: Diagnosis not present

## 2024-05-01 DIAGNOSIS — E66811 Obesity, class 1: Secondary | ICD-10-CM

## 2024-05-01 DIAGNOSIS — M25562 Pain in left knee: Secondary | ICD-10-CM

## 2024-05-01 MED ORDER — BUSPIRONE HCL 5 MG PO TABS: 5.0000 mg | ORAL_TABLET | Freq: Two times a day (BID) | ORAL | 5 refills | Status: DC

## 2024-05-01 NOTE — Assessment & Plan Note (Signed)
 Controlled, no change in medication DASH diet and commitment to daily physical activity for a minimum of 30 minutes discussed and encouraged, as a part of hypertension management. The importance of attaining a healthy weight is also discussed.     05/01/2024    3:07 PM 04/30/2024    3:12 PM 04/12/2024    9:11 AM 03/13/2024   11:05 AM 02/25/2024    5:12 PM 02/25/2024    3:15 PM 02/25/2024   12:08 PM  BP/Weight  Systolic BP 119 -- 122 134 120 129 132  Diastolic BP 74 -- 72 78 80 80 76  Wt. (Lbs) 203.08 205 201.4 202.12   205  BMI 30.88 kg/m2 31.17 kg/m2 30.62 kg/m2 30.73 kg/m2   31.17 kg/m2

## 2024-05-01 NOTE — Assessment & Plan Note (Signed)
  Patient re-educated about  the importance of commitment to a  minimum of 150 minutes of exercise per week as able.  The importance of healthy food choices with portion control discussed, as well as eating regularly and within a 12 hour window most days. The need to choose clean , [place] food 50 to 75% of the time is discussed, as well as to make water  the primary drink and set a goal of 64 ounces water  daily.       05/01/2024    3:07 PM 04/30/2024    3:12 PM 04/12/2024    9:11 AM  Weight /BMI  Weight 203 lb 1.3 oz 205 lb 201 lb 6.4 oz  Height 5' 8 (1.727 m) 5' 8 (1.727 m) 5' 8 (1.727 m)  BMI 30.88 kg/m2 31.17 kg/m2 30.62 kg/m2    Unchanged

## 2024-05-01 NOTE — Assessment & Plan Note (Signed)
 Managed by ortho intra articular injections every 3 to 4 months

## 2024-05-01 NOTE — Assessment & Plan Note (Addendum)
 Diabetes associated with hypertension, hyperlipidemia, obesity, arthritis, and depression  Norman Peters is reminded of the importance of commitment to daily physical activity for 30 minutes or more, as able and the need to limit carbohydrate intake to 30 to 60 grams per meal to help with blood sugar control.   The need to take medication as prescribed, test blood sugar as directed, and to call between visits if there is a concern that blood sugar is uncontrolled is also discussed.   Norman Peters is reminded of the importance of daily foot exam, annual eye examination, and good blood sugar, blood pressure and cholesterol control.     Latest Ref Rng & Units 04/12/2024    9:30 AM 03/13/2024   11:48 AM 02/25/2024   12:40 PM 11/25/2023    8:51 AM 11/03/2023    9:57 AM  Diabetic Labs  HbA1c 4.0 - 5.6 % 6.2    6.9    Chol 100 - 199 mg/dL     894   HDL >60 mg/dL     37   Calc LDL 0 - 99 mg/dL     38   Triglycerides 0 - 149 mg/dL     817   Creatinine 9.23 - 1.27 mg/dL  8.28  8.12   8.18       05/01/2024    3:07 PM 04/30/2024    3:12 PM 04/12/2024    9:11 AM 03/13/2024   11:05 AM 02/25/2024    5:12 PM 02/25/2024    3:15 PM 02/25/2024   12:08 PM  BP/Weight  Systolic BP 119 -- 122 134 120 129 132  Diastolic BP 74 -- 72 78 80 80 76  Wt. (Lbs) 203.08 205 201.4 202.12   205  BMI 30.88 kg/m2 31.17 kg/m2 30.62 kg/m2 30.73 kg/m2   31.17 kg/m2      Latest Ref Rng & Units 05/01/2024    3:00 PM 04/21/2023   12:00 AM  Foot/eye exam completion dates  Eye Exam No Retinopathy  No Retinopathy      Foot Form Completion  Done      This result is from an external source.      Very well controlled  by endo

## 2024-05-01 NOTE — Progress Notes (Signed)
 Norman Peters     MRN: [id number]      DOB: [birth year]  Chief Complaint  Patient presents with   Hypertension    Follow up     HPI Norman Peters is here for follow up and re-evaluation of chronic medical conditions, medication management and review of any available recent lab and radiology data.  Preventive health is updated, specifically  Cancer screening and Immunization.   Questions or concerns regarding consultations or procedures which the PT has had in the interim are  addressed. The PT denies any adverse reactions to current medications since the last visit.  There are no new concerns.  There are no specific complaints  Diabetic med is reduced and control  is excellent Denies polyuria, polydipsia, blurred vision , or hypoglycemic episodes.   ROS Denies recent fever or chills. Denies sinus pressure, nasal congestion, ear pain or sore throat. Denies chest congestion, productive cough or wheezing. Denies chest pains, palpitations and leg swelling Denies abdominal pain, nausea, vomiting,diarrhea or constipation.   Denies dysuria, frequency, hesitancy or incontinence. Chronic knee pain intra articular injections every 3 to  4 months Denies headaches, seizures, numbness, or tingling. Denies depression, anxiety or insomnia. Denies skin break down or rash.   PE  BP 119/74   Pulse 88   Resp 16   Ht 5' 8 (1.727 m)   Wt 203 lb 1.3 oz (92.1 kg)   SpO2 94%   BMI 30.88 kg/m   Patient alert and oriented and in no cardiopulmonary distress.  HEENT: No facial asymmetry, EOMI,     Neck supple .  Chest: Clear to auscultation bilaterally.  CVS: S1, S2 no murmurs, no S3.Regular rate.  ABD: Soft non tender.   Ext: No edema  MS: Reduced  ROM spine,  adequate I shoulders, hips and reduced in  knees.  Skin: Intact, no ulcerations or rash noted.  Psych: Good eye contact, normal affect. Memory intact not anxious or depressed appearing.  CNS: CN 2-12 intact, power,  normal  throughout.no focal deficits noted.   Assessment & Plan  Anxiety and depression Controlled, no change in medication   Essential hypertension, benign Controlled, no change in medication DASH diet and commitment to daily physical activity for a minimum of 30 minutes discussed and encouraged, as a part of hypertension management. The importance of attaining a healthy weight is also discussed.     05/01/2024    3:07 PM 04/30/2024    3:12 PM 04/12/2024    9:11 AM 03/13/2024   11:05 AM 02/25/2024    5:12 PM 02/25/2024    3:15 PM 02/25/2024   12:08 PM  BP/Weight  Systolic BP 119 -- 122 134 120 129 132  Diastolic BP 74 -- 72 78 80 80 76  Wt. (Lbs) 203.08 205 201.4 202.12   205  BMI 30.88 kg/m2 31.17 kg/m2 30.62 kg/m2 30.73 kg/m2   31.17 kg/m2       Chronic pain of both knees Managed by ortho intra articular injections every 3 to 4 months  Hyperlipidemia LDL goal <100 Hyperlipidemia:Low fat diet discussed and encouraged.   Lipid Panel  Lab Results  Component Value Date   CHOL 105 11/03/2023   HDL 37 (L) 11/03/2023   LDLCALC 38 11/03/2023   TRIG 182 (H) 11/03/2023   CHOLHDL 2.8 11/03/2023     Updated lab needed at/ before next visit.   Type 2 diabetes mellitus with other specified complication (HCC) Diabetes associated with hypertension, hyperlipidemia, obesity, arthritis, and depression  Norman Peters is reminded of the importance of commitment to daily physical activity for 30 minutes or more, as able and the need to limit carbohydrate intake to 30 to 60 grams per meal to help with blood sugar control.   The need to take medication as prescribed, test blood sugar as directed, and to call between visits if there is a concern that blood sugar is uncontrolled is also discussed.   Norman Peters is reminded of the importance of daily foot exam, annual eye examination, and good blood sugar, blood pressure and cholesterol control.     Latest Ref Rng & Units 04/12/2024    9:30 AM  03/13/2024   11:48 AM 02/25/2024   12:40 PM 11/25/2023    8:51 AM 11/03/2023    9:57 AM  Diabetic Labs  HbA1c 4.0 - 5.6 % 6.2    6.9    Chol 100 - 199 mg/dL     894   HDL >60 mg/dL     37   Calc LDL 0 - 99 mg/dL     38   Triglycerides 0 - 149 mg/dL     817   Creatinine 9.23 - 1.27 mg/dL  8.28  8.12   8.18       05/01/2024    3:07 PM 04/30/2024    3:12 PM 04/12/2024    9:11 AM 03/13/2024   11:05 AM 02/25/2024    5:12 PM 02/25/2024    3:15 PM 02/25/2024   12:08 PM  BP/Weight  Systolic BP 119 -- 122 134 120 129 132  Diastolic BP 74 -- 72 78 80 80 76  Wt. (Lbs) 203.08 205 201.4 202.12   205  BMI 30.88 kg/m2 31.17 kg/m2 30.62 kg/m2 30.73 kg/m2   31.17 kg/m2      Latest Ref Rng & Units 05/01/2024    3:00 PM 04/21/2023   12:00 AM  Foot/eye exam completion dates  Eye Exam No Retinopathy  No Retinopathy      Foot Form Completion  Done      This result is from an external source.      Very well controlled  by endo  Obesity (BMI 30.0-34.9)  Patient re-educated about  the importance of commitment to a  minimum of 150 minutes of exercise per week as able.  The importance of healthy food choices with portion control discussed, as well as eating regularly and within a 12 hour window most days. The need to choose clean , [place] food 50 to 75% of the time is discussed, as well as to make water  the primary drink and set a goal of 64 ounces water  daily.       05/01/2024    3:07 PM 04/30/2024    3:12 PM 04/12/2024    9:11 AM  Weight /BMI  Weight 203 lb 1.3 oz 205 lb 201 lb 6.4 oz  Height 5' 8 (1.727 m) 5' 8 (1.727 m) 5' 8 (1.727 m)  BMI 30.88 kg/m2 31.17 kg/m2 30.62 kg/m2    Unchanged

## 2024-05-01 NOTE — Assessment & Plan Note (Signed)
 Controlled, no change in medication

## 2024-05-01 NOTE — Assessment & Plan Note (Signed)
 Hyperlipidemia:Low fat diet discussed and encouraged.   Lipid Panel  Lab Results  Component Value Date   CHOL 105 11/03/2023   HDL 37 (L) 11/03/2023   LDLCALC 38 11/03/2023   TRIG 182 (H) 11/03/2023   CHOLHDL 2.8 11/03/2023     Updated lab needed at/ before next visit.

## 2024-05-01 NOTE — Patient Instructions (Addendum)
 F/U in 5 months, call if you need me sooner  Labs ordered Jan 14 today includes urine test  CONGRATS on excellent bloodsugar and blood pressure  It is important that you exercise regularly at least 30 minutes 5 times a week. If you develop chest pain, have severe difficulty breathing, or feel very tired, stop exercising immediately and seek medical attention     Think about what you will eat, plan ahead. Choose  clean, [place], fresh or frozen over canned, processed or packaged foods which are more sugary, salty and fatty. 70 to 75% of food eaten should be vegetables and fruit. Three meals at set times with snacks allowed between meals, but they must be fruit or vegetables. Aim to eat over a 12 hour period , example 7 am to 7 pm, and STOP after  your last meal of the day. Drink water ,generally about 64 ounces per day, no other drink is as healthy. Fruit juice is best enjoyed in a healthy way, by EATING the fruit.  Thanks for choosing [facility], we consider it a privelige to serve you.

## 2024-05-03 ENCOUNTER — Telehealth: Payer: Self-pay

## 2024-05-03 ENCOUNTER — Ambulatory Visit: Payer: Self-pay | Admitting: Family Medicine

## 2024-05-03 LAB — CMP14+EGFR
ALT: 8 IU/L (ref 0–44)
AST: 13 IU/L (ref 0–40)
Albumin: 4.6 g/dL (ref 3.9–4.9)
Alkaline Phosphatase: 81 IU/L (ref 44–121)
BUN/Creatinine Ratio: 13 (ref 10–24)
BUN: 24 mg/dL (ref 8–27)
Bilirubin Total: 0.6 mg/dL (ref 0.0–1.2)
CO2: 24 mmol/L (ref 20–29)
Calcium: 9.9 mg/dL (ref 8.6–10.2)
Chloride: 99 mmol/L (ref 96–106)
Creatinine, Ser: 1.79 mg/dL — ABNORMAL HIGH (ref 0.76–1.27)
Globulin, Total: 2.6 g/dL (ref 1.5–4.5)
Glucose: 76 mg/dL (ref 70–99)
Potassium: 4.7 mmol/L (ref 3.5–5.2)
Sodium: 136 mmol/L (ref 134–144)
Total Protein: 7.2 g/dL (ref 6.0–8.5)
eGFR: 41 mL/min/{1.73_m2} — ABNORMAL LOW (ref >59)

## 2024-05-03 LAB — VITAMIN B12: Vitamin B-12: 1096 pg/mL (ref 232–1245)

## 2024-05-03 LAB — LIPID PANEL
Chol/HDL Ratio: 3 ratio (ref 0.0–5.0)
Cholesterol, Total: 98 mg/dL — ABNORMAL LOW (ref 100–199)
HDL: 33 mg/dL — ABNORMAL LOW (ref >39)
LDL Chol Calc (NIH): 36 mg/dL (ref 0–99)
Triglycerides: 173 mg/dL — ABNORMAL HIGH (ref 0–149)
VLDL Cholesterol Cal: 29 mg/dL (ref 5–40)

## 2024-05-03 LAB — URIC ACID: Uric Acid: 3.2 mg/dL — ABNORMAL LOW (ref 3.8–8.4)

## 2024-05-03 LAB — MICROALBUMIN / CREATININE URINE RATIO
Creatinine, Urine: 117.2 mg/dL
Microalb/Creat Ratio: 17 mg/g{creat} (ref 0–29)
Microalbumin, Urine: 19.4 ug/mL

## 2024-05-03 NOTE — Telephone Encounter (Signed)
 Copied from CRM [id number]. Topic: Clinical - Lab/Test Results >> May 03, 2024 10:57 AM Sophia H wrote: Reason for CRM: Patient is calling in regarding recent lab work - he is mostly interested in following up on his urine sample results, please advise.  # [phone]

## 2024-05-03 NOTE — Telephone Encounter (Signed)
Waiting for provider to review.

## 2024-05-05 DIAGNOSIS — E1122 Type 2 diabetes mellitus with diabetic chronic kidney disease: Secondary | ICD-10-CM | POA: Diagnosis not present

## 2024-05-05 DIAGNOSIS — R809 Proteinuria, unspecified: Secondary | ICD-10-CM | POA: Diagnosis not present

## 2024-05-05 DIAGNOSIS — N1832 Chronic kidney disease, stage 3b: Secondary | ICD-10-CM | POA: Diagnosis not present

## 2024-05-05 DIAGNOSIS — I129 Hypertensive chronic kidney disease with stage 1 through stage 4 chronic kidney disease, or unspecified chronic kidney disease: Secondary | ICD-10-CM | POA: Diagnosis not present

## 2024-05-07 ENCOUNTER — Ambulatory Visit (INDEPENDENT_AMBULATORY_CARE_PROVIDER_SITE_OTHER): Admitting: Orthopedic Surgery

## 2024-05-07 ENCOUNTER — Other Ambulatory Visit (HOSPITAL_COMMUNITY): Payer: Self-pay | Admitting: Nephrology

## 2024-05-07 ENCOUNTER — Encounter: Payer: Self-pay | Admitting: Orthopedic Surgery

## 2024-05-07 DIAGNOSIS — M170 Bilateral primary osteoarthritis of knee: Secondary | ICD-10-CM

## 2024-05-07 DIAGNOSIS — N189 Chronic kidney disease, unspecified: Secondary | ICD-10-CM

## 2024-05-07 DIAGNOSIS — M1711 Unilateral primary osteoarthritis, right knee: Secondary | ICD-10-CM

## 2024-05-07 DIAGNOSIS — M1712 Unilateral primary osteoarthritis, left knee: Secondary | ICD-10-CM

## 2024-05-07 DIAGNOSIS — E1122 Type 2 diabetes mellitus with diabetic chronic kidney disease: Secondary | ICD-10-CM

## 2024-05-07 DIAGNOSIS — N1832 Chronic kidney disease, stage 3b: Secondary | ICD-10-CM

## 2024-05-07 DIAGNOSIS — I129 Hypertensive chronic kidney disease with stage 1 through stage 4 chronic kidney disease, or unspecified chronic kidney disease: Secondary | ICD-10-CM

## 2024-05-07 MED ORDER — METHYLPREDNISOLONE ACETATE 40 MG/ML IJ SUSP
40.0000 mg | Freq: Once | INTRAMUSCULAR | Status: AC
  Administered 2024-05-07: 40 mg via INTRA_ARTICULAR

## 2024-05-07 NOTE — Progress Notes (Signed)
 Chief Complaint  Patient presents with   Injections    Bilateral knees   Norman Peters request bilateral knee injections for osteoarthritis and chronic knee pain  Procedure note for bilateral knee injections  Procedure note left knee injection verbal consent was obtained to inject left knee joint  Timeout was completed to confirm the site of injection  The medications used were 40 mg depomedrol and 3 cc of 1% lidocaine   Anesthesia was provided by ethyl chloride and the skin was prepped with alcohol.  After cleaning the skin with alcohol a 20-gauge needle was used to inject the left knee joint. There were no complications. A sterile bandage was applied.   Procedure note right knee injection verbal consent was obtained to inject right knee joint  Timeout was completed to confirm the site of injection  The medications used were 40 mg depomedrol and 3 cc of 1% lidocaine   Anesthesia was provided by ethyl chloride and the skin was prepped with alcohol.  After cleaning the skin with alcohol a 20-gauge needle was used to inject the right knee joint. There were no complications. A sterile bandage was applied.    Encounter Diagnoses  Name Primary?   Primary osteoarthritis of right knee Yes   Primary osteoarthritis of left knee

## 2024-05-15 ENCOUNTER — Ambulatory Visit (HOSPITAL_COMMUNITY)
Admission: RE | Admit: 2024-05-15 | Discharge: 2024-05-15 | Disposition: A | Discharge status: Home / Self Care | Source: Ambulatory Visit | Attending: Nephrology | Admitting: Nephrology

## 2024-05-15 DIAGNOSIS — E1122 Type 2 diabetes mellitus with diabetic chronic kidney disease: Secondary | ICD-10-CM | POA: Diagnosis not present

## 2024-05-15 DIAGNOSIS — N1832 Chronic kidney disease, stage 3b: Secondary | ICD-10-CM | POA: Insufficient documentation

## 2024-05-15 DIAGNOSIS — I129 Hypertensive chronic kidney disease with stage 1 through stage 4 chronic kidney disease, or unspecified chronic kidney disease: Secondary | ICD-10-CM | POA: Insufficient documentation

## 2024-05-15 DIAGNOSIS — N189 Chronic kidney disease, unspecified: Secondary | ICD-10-CM | POA: Insufficient documentation

## 2024-05-16 ENCOUNTER — Other Ambulatory Visit: Payer: Self-pay | Admitting: Family Medicine

## 2024-05-23 DIAGNOSIS — E1165 Type 2 diabetes mellitus with hyperglycemia: Secondary | ICD-10-CM | POA: Diagnosis not present

## 2024-05-23 DIAGNOSIS — E1122 Type 2 diabetes mellitus with diabetic chronic kidney disease: Secondary | ICD-10-CM | POA: Diagnosis not present

## 2024-06-04 ENCOUNTER — Other Ambulatory Visit: Payer: Self-pay | Admitting: Family Medicine

## 2024-06-09 ENCOUNTER — Other Ambulatory Visit: Payer: Self-pay | Admitting: Family Medicine

## 2024-06-20 ENCOUNTER — Other Ambulatory Visit: Payer: Self-pay | Admitting: Family Medicine

## 2024-07-18 ENCOUNTER — Other Ambulatory Visit: Payer: Self-pay | Admitting: Family Medicine

## 2024-07-23 ENCOUNTER — Other Ambulatory Visit: Payer: Self-pay | Admitting: Family Medicine

## 2024-07-25 ENCOUNTER — Other Ambulatory Visit: Payer: Self-pay | Admitting: Family Medicine

## 2024-07-26 ENCOUNTER — Other Ambulatory Visit (HOSPITAL_COMMUNITY)
Admission: RE | Admit: 2024-07-26 | Discharge: 2024-07-26 | Disposition: A | Discharge status: Home / Self Care | Source: Ambulatory Visit | Attending: Nephrology | Admitting: Nephrology

## 2024-07-26 DIAGNOSIS — R809 Proteinuria, unspecified: Secondary | ICD-10-CM | POA: Diagnosis not present

## 2024-07-26 DIAGNOSIS — E1122 Type 2 diabetes mellitus with diabetic chronic kidney disease: Secondary | ICD-10-CM | POA: Diagnosis present

## 2024-07-26 DIAGNOSIS — N1832 Chronic kidney disease, stage 3b: Secondary | ICD-10-CM | POA: Insufficient documentation

## 2024-07-26 DIAGNOSIS — I129 Hypertensive chronic kidney disease with stage 1 through stage 4 chronic kidney disease, or unspecified chronic kidney disease: Secondary | ICD-10-CM | POA: Diagnosis not present

## 2024-07-26 DIAGNOSIS — R778 Other specified abnormalities of plasma proteins: Secondary | ICD-10-CM | POA: Diagnosis not present

## 2024-07-26 LAB — VITAMIN D 25 HYDROXY (VIT D DEFICIENCY, FRACTURES): Vit D, 25-Hydroxy: 48.37 ng/mL (ref 30–100)

## 2024-07-26 LAB — IRON AND TIBC
Iron: 68 ug/dL (ref 45–182)
Saturation Ratios: 21 % (ref 17.9–39.5)
TIBC: 331 ug/dL (ref 250–450)
UIBC: 263 ug/dL

## 2024-07-26 LAB — CBC
HCT: 47.7 % (ref 39.0–52.0)
Hemoglobin: 15.3 g/dL (ref 13.0–17.0)
MCH: 29.6 pg (ref 26.0–34.0)
MCHC: 32.1 g/dL (ref 30.0–36.0)
MCV: 92.3 fL (ref 80.0–100.0)
Platelets: 252 K/uL (ref 150–400)
RBC: 5.17 MIL/uL (ref 4.22–5.81)
RDW: 14.3 % (ref 11.5–15.5)
WBC: 10.9 K/uL — ABNORMAL HIGH (ref 4.0–10.5)
nRBC: 0 % (ref 0.0–0.2)

## 2024-07-26 LAB — RENAL FUNCTION PANEL
Albumin: 3.9 g/dL (ref 3.5–5.0)
Anion gap: 12 (ref 5–15)
BUN: 13 mg/dL (ref 8–23)
CO2: 24 mmol/L (ref 22–32)
Calcium: 9.4 mg/dL (ref 8.9–10.3)
Chloride: 102 mmol/L (ref 98–111)
Creatinine, Ser: 1.35 mg/dL — ABNORMAL HIGH (ref 0.61–1.24)
GFR, Estimated: 57 mL/min — ABNORMAL LOW (ref >60)
Glucose, Bld: 135 mg/dL — ABNORMAL HIGH (ref 70–99)
Phosphorus: 3.3 mg/dL (ref 2.5–4.6)
Potassium: 4.1 mmol/L (ref 3.5–5.1)
Sodium: 138 mmol/L (ref 135–145)

## 2024-07-26 LAB — URINALYSIS, W/ REFLEX TO CULTURE (INFECTION SUSPECTED)
Bacteria, UA: NONE SEEN
Bilirubin Urine: NEGATIVE
Glucose, UA: NEGATIVE mg/dL
Hgb urine dipstick: NEGATIVE
Ketones, ur: NEGATIVE mg/dL
Leukocytes,Ua: NEGATIVE
Nitrite: NEGATIVE
Protein, ur: 100 mg/dL — AB
Specific Gravity, Urine: 1.02 (ref 1.005–1.030)
pH: 5 (ref 5.0–8.0)

## 2024-07-26 LAB — FOLATE: Folate: 13.6 ng/mL (ref >5.9)

## 2024-07-26 LAB — VITAMIN B12: Vitamin B-12: 952 pg/mL — ABNORMAL HIGH (ref 180–914)

## 2024-07-26 LAB — HEMOGLOBIN A1C
Hgb A1c MFr Bld: 6.2 % — ABNORMAL HIGH (ref 4.8–5.6)
Mean Plasma Glucose: 131.24 mg/dL

## 2024-07-26 LAB — PROTEIN / CREATININE RATIO, URINE
Creatinine, Urine: 354 mg/dL
Protein Creatinine Ratio: 0.27 mg/mg{creat} — ABNORMAL HIGH (ref 0.00–0.15)
Total Protein, Urine: 97 mg/dL

## 2024-07-26 LAB — HEPATITIS B SURFACE ANTIGEN: Hepatitis B Surface Ag: NONREACTIVE

## 2024-07-26 LAB — MAGNESIUM: Magnesium: 1.9 mg/dL (ref 1.7–2.4)

## 2024-07-26 LAB — FERRITIN: Ferritin: 35 ng/mL (ref 24–336)

## 2024-07-26 LAB — HEPATITIS B SURFACE ANTIBODY,QUALITATIVE: Hep B S Ab: NONREACTIVE

## 2024-07-26 LAB — HEPATITIS B CORE ANTIBODY, IGM: Hep B C IgM: NONREACTIVE

## 2024-07-26 LAB — URIC ACID: Uric Acid, Serum: 3.7 mg/dL (ref 3.7–8.6)

## 2024-07-27 LAB — HCV RT-PCR, QUANT (NON-GRAPH): Hepatitis C Quantitation: NOT DETECTED [IU]/mL

## 2024-07-27 LAB — MICROALBUMIN / CREATININE URINE RATIO
Creatinine, Urine: 307.9 mg/dL
Microalb Creat Ratio: 191 mg/g{creat} — ABNORMAL HIGH (ref 0–29)
Microalb, Ur: 586.6 ug/mL — ABNORMAL HIGH

## 2024-07-27 LAB — PARATHYROID HORMONE, INTACT (NO CA): PTH: 33 pg/mL (ref 15–65)

## 2024-07-27 LAB — HCV AB W REFLEX TO QUANT PCR: HCV Ab: REACTIVE — AB

## 2024-07-28 LAB — HIV-1/HIV-2 QUAL RNA
HIV-1 RNA, Qualitative: NONREACTIVE
HIV-2 RNA, Qualitative: NONREACTIVE

## 2024-07-30 ENCOUNTER — Other Ambulatory Visit: Payer: Self-pay | Admitting: Family Medicine

## 2024-07-30 LAB — PROTEIN ELECTROPHORESIS, SERUM
A/G Ratio: 1.1 (ref 0.7–1.7)
Albumin ELP: 3.5 g/dL (ref 2.9–4.4)
Alpha-1-Globulin: 0.2 g/dL (ref 0.0–0.4)
Alpha-2-Globulin: 0.8 g/dL (ref 0.4–1.0)
Beta Globulin: 1 g/dL (ref 0.7–1.3)
Gamma Globulin: 1.2 g/dL (ref 0.4–1.8)
Globulin, Total: 3.2 g/dL (ref 2.2–3.9)
Total Protein ELP: 6.7 g/dL (ref 6.0–8.5)

## 2024-07-31 LAB — MISC LABCORP TEST (SEND OUT): Labcorp test code: 141330

## 2024-07-31 LAB — IMMUNOFIXATION, URINE

## 2024-08-01 ENCOUNTER — Other Ambulatory Visit: Payer: Self-pay | Admitting: Family Medicine

## 2024-08-01 LAB — MISC LABCORP TEST (SEND OUT): Labcorp test code: 120256

## 2024-08-08 ENCOUNTER — Other Ambulatory Visit: Payer: Self-pay

## 2024-08-08 MED ORDER — CHLORTHALIDONE 25 MG PO TABS: 25.0000 mg | ORAL_TABLET | Freq: Every day | ORAL | 2 refills | Status: DC

## 2024-08-08 MED ORDER — MONTELUKAST SODIUM 10 MG PO TABS: 10.0000 mg | ORAL_TABLET | Freq: Every day | ORAL | 2 refills | Status: AC

## 2024-08-08 MED ORDER — AZELASTINE HCL 0.1 % NA SOLN: 2.0000 | Freq: Two times a day (BID) | NASAL | 12 refills | Status: AC

## 2024-08-09 ENCOUNTER — Ambulatory Visit: Admitting: Orthopedic Surgery

## 2024-08-09 DIAGNOSIS — M170 Bilateral primary osteoarthritis of knee: Secondary | ICD-10-CM

## 2024-08-09 DIAGNOSIS — E1122 Type 2 diabetes mellitus with diabetic chronic kidney disease: Secondary | ICD-10-CM | POA: Diagnosis not present

## 2024-08-09 DIAGNOSIS — M1712 Unilateral primary osteoarthritis, left knee: Secondary | ICD-10-CM

## 2024-08-09 DIAGNOSIS — N1831 Chronic kidney disease, stage 3a: Secondary | ICD-10-CM | POA: Diagnosis not present

## 2024-08-09 DIAGNOSIS — M1711 Unilateral primary osteoarthritis, right knee: Secondary | ICD-10-CM

## 2024-08-09 DIAGNOSIS — R809 Proteinuria, unspecified: Secondary | ICD-10-CM | POA: Diagnosis not present

## 2024-08-09 DIAGNOSIS — I129 Hypertensive chronic kidney disease with stage 1 through stage 4 chronic kidney disease, or unspecified chronic kidney disease: Secondary | ICD-10-CM | POA: Diagnosis not present

## 2024-08-09 MED ORDER — METHYLPREDNISOLONE ACETATE 40 MG/ML IJ SUSP
40.0000 mg | Freq: Once | INTRAMUSCULAR | Status: AC
  Administered 2024-08-09: 40 mg via INTRA_ARTICULAR

## 2024-08-09 NOTE — Progress Notes (Signed)
 Chief Complaint  Patient presents with   Injections    Both knees    Encounter Diagnoses  Name Primary?   Primary osteoarthritis of right knee Yes   Primary osteoarthritis of left knee    Primary osteoarthritis of both knees     Procedure note for bilateral knee injections  Procedure note left knee injection verbal consent was obtained to inject left knee joint  Timeout was completed to confirm the site of injection  The medications used were 40 mg depomedrol and 3 cc of 1% lidocaine   Anesthesia was provided by ethyl chloride and the skin was prepped with alcohol.  After cleaning the skin with alcohol a 20-gauge needle was used to inject the left knee joint. There were no complications. A sterile bandage was applied.   Procedure note right knee injection verbal consent was obtained to inject right knee joint  Timeout was completed to confirm the site of injection  The medications used were 40 mg depomedrol and 3 cc of 1% lidocaine   Anesthesia was provided by ethyl chloride and the skin was prepped with alcohol.  After cleaning the skin with alcohol a 20-gauge needle was used to inject the right knee joint. There were no complications. A sterile bandage was applied.

## 2024-08-14 ENCOUNTER — Other Ambulatory Visit: Payer: Self-pay

## 2024-08-14 MED ORDER — CHLORPHENIRAMINE MALEATE 4 MG PO TABS: ORAL_TABLET | ORAL | 0 refills | Status: AC

## 2024-08-16 ENCOUNTER — Telehealth: Payer: Self-pay | Admitting: *Deleted

## 2024-08-16 NOTE — Telephone Encounter (Signed)
 Received notification from [facility] that the patient was needing a PA for his Ozempic  2 mg. This will be sent to the PA department for their help.

## 2024-08-17 ENCOUNTER — Other Ambulatory Visit (HOSPITAL_COMMUNITY): Payer: Self-pay

## 2024-08-17 ENCOUNTER — Telehealth: Payer: Self-pay

## 2024-08-17 NOTE — Telephone Encounter (Signed)
 Pharmacy Patient Advocate Encounter   Received notification from Pt Calls Messages that prior authorization for Ozempic  2 MG/DOSE, 8 MG/3ML  is required/requested.   Insurance verification completed.   The patient is insured through [employer].   Per test claim: Refill too soon. PA is not needed at this time. Medication was filled 08/03/24. Next eligible fill date is 10/05/24.

## 2024-08-20 NOTE — Telephone Encounter (Signed)
 Patient was called , voice message was left sharing this information with the patient.

## 2024-08-21 DIAGNOSIS — E1122 Type 2 diabetes mellitus with diabetic chronic kidney disease: Secondary | ICD-10-CM | POA: Diagnosis not present

## 2024-08-21 DIAGNOSIS — E1165 Type 2 diabetes mellitus with hyperglycemia: Secondary | ICD-10-CM | POA: Diagnosis not present

## 2024-08-26 ENCOUNTER — Other Ambulatory Visit: Payer: Self-pay | Admitting: Family Medicine

## 2024-08-29 ENCOUNTER — Inpatient Hospital Stay: Payer: Medicare HMO | Attending: Physician Assistant

## 2024-08-29 DIAGNOSIS — E538 Deficiency of other specified B group vitamins: Secondary | ICD-10-CM | POA: Diagnosis not present

## 2024-08-29 DIAGNOSIS — N189 Chronic kidney disease, unspecified: Secondary | ICD-10-CM | POA: Insufficient documentation

## 2024-08-29 DIAGNOSIS — D539 Nutritional anemia, unspecified: Secondary | ICD-10-CM | POA: Diagnosis not present

## 2024-08-29 DIAGNOSIS — D72821 Monocytosis (symptomatic): Secondary | ICD-10-CM | POA: Insufficient documentation

## 2024-08-29 DIAGNOSIS — D518 Other vitamin B12 deficiency anemias: Secondary | ICD-10-CM

## 2024-08-29 DIAGNOSIS — Z87891 Personal history of nicotine dependence: Secondary | ICD-10-CM | POA: Insufficient documentation

## 2024-08-29 LAB — CBC WITH DIFFERENTIAL/PLATELET
Abs Immature Granulocytes: 0.05 K/uL (ref 0.00–0.07)
Basophils Absolute: 0.1 K/uL (ref 0.0–0.1)
Basophils Relative: 1 %
Eosinophils Absolute: 0.5 K/uL (ref 0.0–0.5)
Eosinophils Relative: 4 %
HCT: 50.7 % (ref 39.0–52.0)
Hemoglobin: 16.1 g/dL (ref 13.0–17.0)
Immature Granulocytes: 0 %
Lymphocytes Relative: 19 %
Lymphs Abs: 2.2 K/uL (ref 0.7–4.0)
MCH: 29.8 pg (ref 26.0–34.0)
MCHC: 31.8 g/dL (ref 30.0–36.0)
MCV: 93.7 fL (ref 80.0–100.0)
Monocytes Absolute: 1.1 K/uL — ABNORMAL HIGH (ref 0.1–1.0)
Monocytes Relative: 9 %
Neutro Abs: 8 K/uL — ABNORMAL HIGH (ref 1.7–7.7)
Neutrophils Relative %: 67 %
Platelets: 253 K/uL (ref 150–400)
RBC: 5.41 MIL/uL (ref 4.22–5.81)
RDW: 13.9 % (ref 11.5–15.5)
WBC: 12 K/uL — ABNORMAL HIGH (ref 4.0–10.5)
nRBC: 0 % (ref 0.0–0.2)

## 2024-08-29 LAB — LACTATE DEHYDROGENASE: LDH: 148 U/L (ref 98–192)

## 2024-08-29 LAB — VITAMIN B12: Vitamin B-12: 1131 pg/mL — ABNORMAL HIGH (ref 180–914)

## 2024-09-03 NOTE — Progress Notes (Unsigned)
 [facility] [address], [place] [address]   CLINIC:  Medical Oncology/Hematology  PCP:  Antonetta Rollene BRAVO, MD [address] [place] [address] [phone]    REASON FOR VISIT:  Follow-up for macrocytic anemia secondary to B12 deficiency  CURRENT THERAPY: B12 1000 mcg daily  INTERVAL HISTORY:   Norman Peters 68 y.o. male returns for routine follow-up of his macrocytic anemia secondary to B12 deficiency.   He was last seen by Pleasant Barefoot PA-C on 08/31/2023.  At today's visit, he reports feeling fairly well.   No recent hospitalizations, surgeries, or changes in baseline health status.   He continues to take vitamin B12 1000 mcg daily.   No fatigue, fever, chills, shortness of breath, cough, chest pain, nausea, vomiting, abdominal pain.   Denies any signs or symptoms of blood loss.   No masses or lymphadenopathy. He has 75% energy and 100% appetite.  He endorses that he is maintaining a stable weight.  ASSESSMENT & PLAN:  1.  Severe macrocytic anemia secondary to vitamin B12 deficiency: - Severe macrocytic anemia on 07/10/2021 with hemoglobin 6.9.  He required 2 units PRBC. - Vitamin B12 on 07/12/2021 was found to be severely low at <50 - Intrinsic factor and parietal cell antibodies were highly suggestive of pernicious anemia.  - Colonoscopy on 07/07/2016 with 5 mm rectosigmoid hyperplastic polyp removed. - CTAP on 03/07/2021 with spleen size normal. - Has occasional blood on tissue when constipated, most likely from external hemorrhoids.  Denies any fevers, night sweats or weight loss. - Intrinsic factor and parietal cell antibodies were high suggestive of pernicious anemia.  - He is taking B12 1 mg tablet daily. - Most recent labs (08/29/2024): Normal Hgb 16.1/MCV 93.7, vitamin B12 1131, normal LDH - PLAN: Macrocytic anemia secondary to severe vitamin B12 deficiency.  B12 and hemoglobin levels currently normal. - Continue B12  supplementation indefinitely. - Macrocytic anemia has resolved after successful B12 repletion.  Recommend discharge to PCP at this time.    2.  Monocytosis - Have discussed with patient that his white count has been intermittently elevated for the past few years.  Differential predominantly monocytes with occasional elevations in lymphocytes.  He does not have any other cytopenias. - Most recent labs (08/29/2024): WBC 12.0, ANC 8.0, monocytes 1.1. - PLAN: Continue surveillance via PCP.  If any significant changes, recommend return to [facility] for additional labs and possible bone marrow biopsy.  3.  CKD: - He has CKD since 2017.  4.  Social/family history: - He lives at home with his wife.  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  No exposure to chemicals.  He smoked 1 pack/day for 40 years.  Quit smoking in 2012. - No family history of cancers or leukemias  PLAN SUMMARY:  >> Tentative discharge to PCP, can return as needed     REVIEW OF SYSTEMS: No acute complaints at today's visit.  Review of Systems  Constitutional:  Negative for appetite change, chills, diaphoresis, fatigue, fever and unexpected weight change.  HENT:   Negative for lump/mass and nosebleeds.   Eyes:  Negative for eye problems.  Respiratory:  Negative for cough, hemoptysis and shortness of breath.   Cardiovascular:  Negative for chest pain, leg swelling and palpitations.  Gastrointestinal:  Negative for abdominal pain, blood in stool, constipation, diarrhea, nausea and vomiting.  Genitourinary:  Negative for hematuria.  Skin: Negative.   Neurological:  Negative for dizziness, headaches and light-headedness.  Hematological:  Does not bruise/bleed easily.     PHYSICAL EXAM:  ECOG PERFORMANCE STATUS: 0 - Asymptomatic  Vitals:   09/04/24 0821  BP: 133/80  Pulse: 88  Resp: 18  Temp: 98.2 F (36.8 C)  SpO2: 96%    Filed  Weights   09/04/24 0821  Weight: 205 lb (93 kg)    Physical Exam Constitutional:      Appearance: Normal appearance. He is obese.  Cardiovascular:     Heart sounds: Normal heart sounds.  Pulmonary:     Breath sounds: Normal breath sounds.  Neurological:     General: No focal deficit present.     Mental Status: Mental status is at baseline.  Psychiatric:        Behavior: Behavior normal. Behavior is cooperative.     PAST MEDICAL/SURGICAL HISTORY:  Past Medical History:  Diagnosis Date   Acute left-sided low back pain with left-sided sciatica 11/04/2020   Allergy    Anemia    Anxiety    Arthritis    Blood transfusion without reported diagnosis    Cataract    Chronic kidney disease    Depression 2001   Diabetes mellitus 2010   GERD (gastroesophageal reflux disease)    Hyperlipidemia    Hypertension 2005   Past Surgical History:  Procedure Laterality Date   COLON SURGERY     COLONOSCOPY N/A 07/07/2016   Procedure: COLONOSCOPY;  Surgeon: Claudis RAYMOND Rivet, MD;  Location: [facility];  Service: Endoscopy;  Laterality: N/A;  1030   FINGER SURGERY Left 1974   fifth , trauma on job, had to replace/repair finger   HERNIA REPAIR  2006   x3, umbilical and bilateral hernia    SOCIAL HISTORY:  Social History   Socioeconomic History   Marital status: Married    Spouse name: Not on file   Number of children: Not on file   Years of education: Not on file   Highest education level: 10th grade  Occupational History   Not on file  Tobacco Use   Smoking status: Former    Current packs/day: 0.00    Average packs/day: 0.5 packs/day for 30.0 years (15.0 ttl pk-yrs)    Types: Cigarettes    Start date: 01/19/1981    Quit date: 01/20/2011    Years since quitting: 13.6    Passive exposure: Never   Smokeless tobacco: Never  Vaping Use   Vaping status: Never Used  Substance and Sexual Activity   Alcohol use: No   Drug use: No   Sexual activity: Not Currently  Other Topics  Concern   Not on file  Social History Narrative   Not on file   Social Drivers of Health   Financial Resource Strain: Low Risk  (04/26/2024)   Overall Financial Resource Strain (CARDIA)    Difficulty of Paying Living Expenses: Not hard at all  Food Insecurity: No Food Insecurity (04/26/2024)   Hunger Vital Sign    Worried About Running Out of Food in the Last Year: Never true    Ran Out of Food in the Last Year: Never true  Transportation Needs: No Transportation Needs (04/26/2024)   PRAPARE - Administrator, Civil Service (Medical): No    Lack of Transportation (Non-Medical): No  Physical Activity: Insufficiently Active (04/26/2024)   Exercise Vital Sign    Days of Exercise per Week: 1 day    Minutes of Exercise  per Session: 40 min  Stress: No Stress Concern Present (04/26/2024)   [employer] of Occupational Health - Occupational Stress Questionnaire    Feeling of Stress: Not at all  Social Connections: Socially Integrated (04/26/2024)   Social Connection and Isolation Panel    Frequency of Communication with Friends and Family: More than three times a week    Frequency of Social Gatherings with Friends and Family: Three times a week    Attends Religious Services: More than 4 times per year    Active Member of Clubs or Organizations: Yes    Attends Banker Meetings: More than 4 times per year    Marital Status: Married  Catering Manager Violence: Not At Risk (04/30/2024)   Humiliation, Afraid, Rape, and Kick questionnaire    Fear of Current or Ex-Partner: No    Emotionally Abused: No    Physically Abused: No    Sexually Abused: No    FAMILY HISTORY:  Family History  Problem Relation Age of Onset   Stroke Mother     CURRENT MEDICATIONS:  Outpatient Encounter Medications as of 09/04/2024  Medication Sig   acetaminophen  (TYLENOL ) 325 MG tablet Take 650 mg by mouth every 6 (six) hours as needed.   albuterol  (VENTOLIN  HFA) 108 (90 Base) MCG/ACT  inhaler Inhale 2 puffs into the lungs daily as needed, for wheezing and shortness of breath.   allopurinol  (ZYLOPRIM ) 300 MG tablet Take 1 tablet by mouth once daily   amLODipine -olmesartan  (AZOR ) 10-40 MG tablet TAKE 1 TABLET EVERY DAY   aspirin EC 81 MG tablet Take 81 mg by mouth every morning.   azelastine  (ASTELIN ) 0.1 % nasal spray Place 2 sprays into both nostrils 2 (two) times daily. Use in each nostril as directed   busPIRone  (BUSPAR ) 5 MG tablet TAKE 1 TABLET BY MOUTH THREE TIMES DAILY   chlorpheniramine  (CHLOR-TRIMETON ) 4 MG tablet Take one tablet by mouth every morning fr 1 week, then as needed   chlorthalidone  (HYGROTON ) 25 MG tablet Take 1 tablet (25 mg total) by mouth daily.   Cholecalciferol (VITAMIN D -3) 25 MCG (1000 UT) CAPS Take by mouth daily.   clotrimazole -betamethasone  (LOTRISONE ) cream APPLY CREAM TOPICALLY TWICE A DAY   cyanocobalamin  1000 MCG tablet Take 1,000 mcg by mouth daily.   FLUoxetine  (PROZAC ) 40 MG capsule TAKE 1 CAPSULE EVERY DAY   glucose blood (TRUE METRIX BLOOD GLUCOSE TEST) test strip TEST BLOOD SUGAR ONE TIME DAILY AS DIRECTED   Insulin  Pen Needle (PEN NEEDLES) 32G X 4 MM MISC Use to inject insulin  once daily   JARDIANCE  10 MG TABS tablet Take 10 mg by mouth daily.   LANTUS  SOLOSTAR 100 UNIT/ML Solostar Pen Inject into the skin.   montelukast  (SINGULAIR ) 10 MG tablet Take 1 tablet (10 mg total) by mouth at bedtime.   POTASSIUM PO Take by mouth daily.   rosuvastatin  (CRESTOR ) 5 MG tablet TAKE 1 TABLET EVERY MONDAY, WEDNESDAY AND FRIDAY (DISCONTINUE PRAVASTATIN )   Semaglutide , 2 MG/DOSE, 8 MG/3ML SOPN Inject 2 mg as directed once a week.   No facility-administered encounter medications on file as of 09/04/2024.    ALLERGIES:  Allergies  Allergen Reactions   Ibuprofen Rash   Lisinopril Cough   Strawberry [Berry] Hives and Swelling    TONGUE SWELLING    LABORATORY DATA:  I have reviewed the labs as listed.  CBC    Component Value Date/Time    WBC 12.0 (H) 08/29/2024 0746   RBC 5.41 08/29/2024 0746   HGB  16.1 08/29/2024 0746   HGB 15.2 03/13/2024 1148   HCT 50.7 08/29/2024 0746   HCT 45.8 03/13/2024 1148   PLT 253 08/29/2024 0746   PLT 292 03/13/2024 1148   MCV 93.7 08/29/2024 0746   MCV 90 03/13/2024 1148   MCH 29.8 08/29/2024 0746   MCHC 31.8 08/29/2024 0746   RDW 13.9 08/29/2024 0746   RDW 14.0 03/13/2024 1148   LYMPHSABS 2.2 08/29/2024 0746   LYMPHSABS 2.5 03/13/2024 1148   MONOABS 1.1 (H) 08/29/2024 0746   EOSABS 0.5 08/29/2024 0746   EOSABS 0.4 03/13/2024 1148   BASOSABS 0.1 08/29/2024 0746   BASOSABS 0.1 03/13/2024 1148      Latest Ref Rng & Units 07/26/2024    8:10 AM 05/01/2024    3:52 PM 03/13/2024   11:48 AM  CMP  Glucose 70 - 99 mg/dL 864  76  75   BUN 8 - 23 mg/dL 13  24  21    Creatinine 0.61 - 1.24 mg/dL 8.64  8.20  8.28   Sodium 135 - 145 mmol/L 138  136  138   Potassium 3.5 - 5.1 mmol/L 4.1  4.7  4.0   Chloride 98 - 111 mmol/L 102  99  99   CO2 22 - 32 mmol/L 24  24  23    Calcium  8.9 - 10.3 mg/dL 9.4  9.9  89.8   Total Protein 6.0 - 8.5 g/dL  7.2  7.3   Total Bilirubin 0.0 - 1.2 mg/dL  0.6  0.6   Alkaline Phos 44 - 121 IU/L  81  101   AST 0 - 40 IU/L  13  14   ALT 0 - 44 IU/L  8  12     DIAGNOSTIC IMAGING:  I have independently reviewed the relevant imaging and discussed with the patient.   WRAP UP:  All questions were answered. The patient knows to call the clinic with any problems, questions or concerns.  Medical decision making: Low  Time spent on visit: I spent 15 minutes counseling the patient face to face. The total time spent in the appointment was 22 minutes and more than 50% was on counseling.  Pleasant CHRISTELLA Barefoot, PA-C  09/04/24 9:06 AM

## 2024-09-04 ENCOUNTER — Inpatient Hospital Stay: Admitting: Physician Assistant

## 2024-09-04 VITALS — BP 133/80 | HR 88 | Temp 98.2°F | Resp 18 | Ht <= 58 in | Wt 205.0 lb

## 2024-09-04 DIAGNOSIS — Z87891 Personal history of nicotine dependence: Secondary | ICD-10-CM | POA: Diagnosis not present

## 2024-09-04 DIAGNOSIS — D539 Nutritional anemia, unspecified: Secondary | ICD-10-CM | POA: Diagnosis not present

## 2024-09-04 DIAGNOSIS — N189 Chronic kidney disease, unspecified: Secondary | ICD-10-CM | POA: Diagnosis not present

## 2024-09-04 DIAGNOSIS — D518 Other vitamin B12 deficiency anemias: Secondary | ICD-10-CM

## 2024-09-04 DIAGNOSIS — E538 Deficiency of other specified B group vitamins: Secondary | ICD-10-CM | POA: Diagnosis not present

## 2024-09-04 DIAGNOSIS — D72821 Monocytosis (symptomatic): Secondary | ICD-10-CM | POA: Diagnosis not present

## 2024-09-04 NOTE — Patient Instructions (Addendum)
 [facility] at [facility] **VISIT SUMMARY & IMPORTANT INSTRUCTIONS **   You were seen today by Pleasant Barefoot PA-C for your follow-up visit.   We have been monitoring and managing your anemia related to vitamin B12 deficiency. You had severe anemia in 2022, related to severe vitamin B12 deficiency. Your most recent labs show normal blood counts and vitamin B12 levels. Continue to take your vitamin B12 supplement (1000 mcg) once daily. You will need to continue taking vitamin B12 supplement for the rest of your life.  If your vitamin B12 levels get too high, you can decrease how often you take vitamin B12 but you should NEVER stop taking vitamin B12. At this time, you do not need any additional follow-up visits at the [facility].  Your primary care provider can continue to check your labs every 6 to 12 months and can send you back to us  in the future if needed. Please note that you do have mild elevations in your white blood cell count.  This does not require additional testing at this time, but if you continue to have elevations in your white blood cell count you may need to come back to us  in the future.  ** Thank you for trusting me with your healthcare!  I strive to provide all of my patients with quality care at each visit.  If you receive a survey for this visit, I would be so grateful to you for taking the time to provide feedback.  Thank you in advance!  ~ Jorden Minchey                                        Dr. Mickiel Davonna Pleasant Barefoot, PA-C     Delon Hope, NP   - - - - - - - - - - - - - - - - - -     Thank you for choosing [facility] at [facility] to provide your oncology and hematology care.  To afford each patient quality time with our provider, please arrive at least 15 minutes before your scheduled appointment time.   If you have a lab appointment with the Cancer Center please come in thru the Main Entrance and check  in at the main information desk.  You need to re-schedule your appointment should you arrive 10 or more minutes late.  We strive to give you quality time with our providers, and arriving late affects you and other patients whose appointments are after yours.  Also, if you no show three or more times for appointments you may be dismissed from the clinic at the providers discretion.     Again, thank you for choosing [facility].  Our hope is that these requests will decrease the amount of time that you wait before being seen by our physicians.       _____________________________________________________________  Should you have questions after your visit to [facility], please contact our office at [phone] and follow the prompts.  Our office hours are 8:00 a.m. and 4:30 p.m. Monday - Friday.  Please note that voicemails left after 4:00 p.m. may not be returned until the following business day.  We are closed weekends and major holidays.  You do have access to a nurse 24-7, just call the main number to the clinic [phone] and  do not press any options, hold on the line and a nurse will answer the phone.    For prescription refill requests, have your pharmacy contact our office and allow 72 hours.

## 2024-09-05 ENCOUNTER — Ambulatory Visit: Payer: Medicare HMO | Admitting: Physician Assistant

## 2024-09-05 LAB — METHYLMALONIC ACID, SERUM: Methylmalonic Acid, Quantitative: 177 nmol/L (ref 0–378)

## 2024-09-10 DIAGNOSIS — H524 Presbyopia: Secondary | ICD-10-CM | POA: Diagnosis not present

## 2024-09-30 ENCOUNTER — Other Ambulatory Visit: Payer: Self-pay | Admitting: Family Medicine

## 2024-10-02 ENCOUNTER — Ambulatory Visit: Admitting: Family Medicine

## 2024-10-12 ENCOUNTER — Encounter: Payer: Self-pay | Admitting: Nurse Practitioner

## 2024-10-12 ENCOUNTER — Ambulatory Visit: Admitting: Nurse Practitioner

## 2024-10-12 VITALS — BP 114/74 | HR 67 | Ht 68.0 in | Wt 206.2 lb

## 2024-10-12 DIAGNOSIS — E1122 Type 2 diabetes mellitus with diabetic chronic kidney disease: Secondary | ICD-10-CM | POA: Diagnosis not present

## 2024-10-12 DIAGNOSIS — E782 Mixed hyperlipidemia: Secondary | ICD-10-CM | POA: Diagnosis not present

## 2024-10-12 DIAGNOSIS — Z7985 Long-term (current) use of injectable non-insulin antidiabetic drugs: Secondary | ICD-10-CM | POA: Diagnosis not present

## 2024-10-12 DIAGNOSIS — I10 Essential (primary) hypertension: Secondary | ICD-10-CM

## 2024-10-12 DIAGNOSIS — Z794 Long term (current) use of insulin: Secondary | ICD-10-CM

## 2024-10-12 DIAGNOSIS — N1832 Chronic kidney disease, stage 3b: Secondary | ICD-10-CM

## 2024-10-12 MED ORDER — SEMAGLUTIDE (2 MG/DOSE) 8 MG/3ML ~~LOC~~ SOPN: 2.0000 mg | PEN_INJECTOR | SUBCUTANEOUS | 3 refills | Status: AC

## 2024-10-12 NOTE — Progress Notes (Signed)
 Endocrinology follow-up note    10/12/2024, 8:59 AM   Subjective:    Patient ID: Norman Peters, male    DOB: 1956/09/24.  Norman Peters is being seen in follow-up after he was seen in consultation for management of currently uncontrolled symptomatic diabetes requested by  Antonetta Rollene BRAVO, MD.   Past Medical History:  Diagnosis Date   Acute left-sided low back pain with left-sided sciatica 11/04/2020   Allergy    Anemia    Anxiety    Arthritis    Blood transfusion without reported diagnosis    Cataract    Chronic kidney disease    Depression 2001   Diabetes mellitus 2010   GERD (gastroesophageal reflux disease)    Hyperlipidemia    Hypertension 2005    Past Surgical History:  Procedure Laterality Date   COLON SURGERY     COLONOSCOPY N/A 07/07/2016   Procedure: COLONOSCOPY;  Surgeon: Claudis RAYMOND Rivet, MD;  Location: [facility];  Service: Endoscopy;  Laterality: N/A;  1030   FINGER SURGERY Left 1974   fifth , trauma on job, had to replace/repair finger   HERNIA REPAIR  2006   x3, umbilical and bilateral hernia    Social History   Socioeconomic History   Marital status: Married    Spouse name: Not on file   Number of children: Not on file   Years of education: Not on file   Highest education level: 10th grade  Occupational History   Not on file  Tobacco Use   Smoking status: Former    Current packs/day: 0.00    Average packs/day: 0.5 packs/day for 30.0 years (15.0 ttl pk-yrs)    Types: Cigarettes    Start date: 01/19/1981    Quit date: 01/20/2011    Years since quitting: 13.7    Passive exposure: Never   Smokeless tobacco: Never  Vaping Use   Vaping status: Never Used  Substance and Sexual Activity   Alcohol use: No   Drug use: No   Sexual activity: Not Currently  Other Topics Concern   Not on file  Social History Narrative   Not on file   Social Drivers of Health    Financial Resource Strain: Low Risk  (04/26/2024)   Overall Financial Resource Strain (CARDIA)    Difficulty of Paying Living Expenses: Not hard at all  Food Insecurity: No Food Insecurity (04/26/2024)   Hunger Vital Sign    Worried About Running Out of Food in the Last Year: Never true    Ran Out of Food in the Last Year: Never true  Transportation Needs: No Transportation Needs (04/26/2024)   PRAPARE - Administrator, Civil Service (Medical): No    Lack of Transportation (Non-Medical): No  Physical Activity: Insufficiently Active (04/26/2024)   Exercise Vital Sign    Days of Exercise per Week: 1 day    Minutes of Exercise per Session: 40 min  Stress: No Stress Concern Present (04/26/2024)   [employer] of Occupational Health - Occupational Stress Questionnaire    Feeling of Stress: Not at all  Social Connections: Socially Integrated (04/26/2024)  Social Connection and Isolation Panel    Frequency of Communication with Friends and Family: More than three times a week    Frequency of Social Gatherings with Friends and Family: Three times a week    Attends Religious Services: More than 4 times per year    Active Member of Clubs or Organizations: Yes    Attends Banker Meetings: More than 4 times per year    Marital Status: Married    Family History  Problem Relation Age of Onset   Stroke Mother     Outpatient Encounter Medications as of 10/12/2024  Medication Sig   acetaminophen  (TYLENOL ) 325 MG tablet Take 650 mg by mouth every 6 (six) hours as needed.   albuterol  (VENTOLIN  HFA) 108 (90 Base) MCG/ACT inhaler Inhale 2 puffs into the lungs daily as needed, for wheezing and shortness of breath.   allopurinol  (ZYLOPRIM ) 300 MG tablet Take 1 tablet by mouth once daily   amLODipine -olmesartan  (AZOR ) 10-40 MG tablet TAKE 1 TABLET EVERY DAY   aspirin EC 81 MG tablet Take 81 mg by mouth every morning.   azelastine  (ASTELIN ) 0.1 % nasal spray Place 2 sprays  into both nostrils 2 (two) times daily. Use in each nostril as directed   busPIRone  (BUSPAR ) 5 MG tablet TAKE 1 TABLET BY MOUTH THREE TIMES DAILY   chlorpheniramine  (CHLOR-TRIMETON ) 4 MG tablet Take one tablet by mouth every morning fr 1 week, then as needed   chlorthalidone  (HYGROTON ) 25 MG tablet Take 1 tablet (25 mg total) by mouth daily.   Cholecalciferol (VITAMIN D -3) 25 MCG (1000 UT) CAPS Take by mouth daily.   clotrimazole -betamethasone  (LOTRISONE ) cream APPLY CREAM TOPICALLY TWICE A DAY   cyanocobalamin  1000 MCG tablet Take 1,000 mcg by mouth daily.   FLUoxetine  (PROZAC ) 40 MG capsule TAKE 1 CAPSULE EVERY DAY   glucose blood (TRUE METRIX BLOOD GLUCOSE TEST) test strip TEST BLOOD SUGAR ONE TIME DAILY AS DIRECTED   Insulin  Pen Needle (PEN NEEDLES) 32G X 4 MM MISC Use to inject insulin  once daily   JARDIANCE  10 MG TABS tablet Take 10 mg by mouth daily.   montelukast  (SINGULAIR ) 10 MG tablet Take 1 tablet (10 mg total) by mouth at bedtime.   POTASSIUM PO Take by mouth daily.   rosuvastatin  (CRESTOR ) 5 MG tablet TAKE 1 TABLET EVERY MONDAY, WEDNESDAY AND FRIDAY (DISCONTINUE PRAVASTATIN )   [DISCONTINUED] LANTUS  SOLOSTAR 100 UNIT/ML Solostar Pen Inject into the skin. (Patient taking differently: Inject 10 Units into the skin at bedtime. Patient states that he injects as needed, at night when blood sugar is high.)   [DISCONTINUED] Semaglutide , 2 MG/DOSE, 8 MG/3ML SOPN Inject 2 mg as directed once a week.   Semaglutide , 2 MG/DOSE, 8 MG/3ML SOPN Inject 2 mg as directed once a week.   No facility-administered encounter medications on file as of 10/12/2024.    ALLERGIES: Allergies  Allergen Reactions   Ibuprofen Rash   Lisinopril Cough   Strawberry [Berry] Hives and Swelling    TONGUE SWELLING    VACCINATION STATUS: Immunization History  Administered Date(s) Administered    sv, Bivalent, Protein Subunit Rsvpref,pf (Abrysvo) 10/08/2022   Fluad Quad(high Dose 65+) 09/02/2020, 07/17/2021,  09/15/2022   Fluad Trivalent(High Dose 65+) 07/05/2023   Influenza,inj,Quad PF,6+ Mos 07/14/2016, 08/25/2017, 08/30/2018, 07/02/2019   MODERNA COVID-19 SARS-COV-2 PEDS BIVALENT BOOSTER 37yr-12yr 07/25/2023   Moderna Covid-19 Vaccine Bivalent Booster 23yrs & up 10/01/2022   PFIZER(Purple Top)SARS-COV-2 Vaccination 01/25/2020, 02/19/2020, 09/28/2020, 05/29/2021, 09/25/2021   PNEUMOCOCCAL CONJUGATE-20 07/17/2021  [employer] Officer, [employer] 39yrs & up 09/25/2021   Pneumococcal Conjugate-13 10/08/2022   Pneumococcal Polysaccharide-23 02/10/2016   Tdap 07/04/2021   Zoster Recombinant(Shingrix) 07/08/2021, 09/09/2021    Diabetes He presents for his follow-up diabetic visit. He has type 2 diabetes mellitus. Onset time: He was diagnosed at approximate age of 48 years. His disease course has been stable. Pertinent negatives for hypoglycemia include no confusion, pallor or seizures. Associated symptoms include fatigue. Pertinent negatives for diabetes include no polydipsia, no polyphagia, no polyuria, no weakness and no weight loss. There are no hypoglycemic complications. Pertinent negatives for hypoglycemia complications include no required assistance (went to ED for hypoglycemia since last visit). Symptoms are improving. Diabetic complications include nephropathy and peripheral neuropathy. Risk factors for coronary artery disease include diabetes mellitus, dyslipidemia, obesity, hypertension, male sex, tobacco exposure and sedentary lifestyle. Current diabetic treatment includes insulin  injections (and Ozempic ). He is compliant with treatment most of the time. His weight is decreasing steadily. He is following a generally healthy diet. When asked about meal planning, he reported none. He has had a previous visit with a dietitian. He rarely participates in exercise. His home blood glucose trend is fluctuating minimally. His overall blood glucose range is 140-180 mg/dl. (He presents today with his  CGM and logs, at goal glycemic profile overall.  His most recent A1c on 9/18 was 6.2%, unchanged from previous visit.   Analysis of his CGM shows TIR 58%, TAR 42%, TBR 0% with a GMI of 7.6%.  He admits he has been snacking more at night recently.  He will take Lantus  on occasion when glucose is higher.) An ACE inhibitor/angiotensin II receptor blocker is not being taken. He does not see a podiatrist.Eye exam is current.    Review of systems  Constitutional: + Minimally fluctuating body weight,  current Body mass index is 31.35 kg/m. , no fatigue, no subjective hyperthermia, no subjective hypothermia Eyes: no blurry vision, no xerophthalmia ENT: no sore throat, no nodules palpated in throat, no dysphagia/odynophagia, no hoarseness Cardiovascular: no chest pain, no shortness of breath, no palpitations, no leg swelling Respiratory: no cough, no shortness of breath Gastrointestinal: no nausea/vomiting/diarrhea Musculoskeletal: no muscle/joint aches, walks with cane Skin: no rashes, no hyperemia Neurological: no tremors, no numbness, no tingling, no dizziness Psychiatric: no depression, no anxiety   Objective:    BP 114/74 (BP Location: Left Arm, Patient Position: Sitting, Cuff Size: Large)   Pulse 67   Ht 5' 8 (1.727 m)   Wt 206 lb 3.2 oz (93.5 kg)   BMI 31.35 kg/m   Wt Readings from Last 3 Encounters:  10/12/24 206 lb 3.2 oz (93.5 kg)  09/04/24 205 lb (93 kg)  05/01/24 203 lb 1.3 oz (92.1 kg)    BP Readings from Last 3 Encounters:  10/12/24 114/74  09/04/24 133/80  05/01/24 119/74     Physical Exam- Limited  Constitutional:  Body mass index is 31.35 kg/m. , not in acute distress, normal state of mind Eyes:  EOMI, no exophthalmos Musculoskeletal: no gross deformities, strength intact in all four extremities, no gross restriction of joint movements, walks with cane Skin:  no rashes, no hyperemia Neurological: no tremor with outstretched hands  Diabetic Foot Exam - Simple    No data filed     CMP     Component Value Date/Time   NA 138 07/26/2024 0810   NA 136 05/01/2024 1552   K 4.1 07/26/2024 0810   CL 102 07/26/2024 0810   CO2 24  07/26/2024 0810   GLUCOSE 135 (H) 07/26/2024 0810   BUN 13 07/26/2024 0810   BUN 24 05/01/2024 1552   CREATININE 1.35 (H) 07/26/2024 0810   CREATININE 1.47 (H) 08/25/2020 0927   CALCIUM  9.4 07/26/2024 0810   PROT 7.2 05/01/2024 1552   ALBUMIN 3.9 07/26/2024 0810   ALBUMIN 4.6 05/01/2024 1552   AST 13 05/01/2024 1552   ALT 8 05/01/2024 1552   ALKPHOS 81 05/01/2024 1552   BILITOT 0.6 05/01/2024 1552   GFRNONAA 57 (L) 07/26/2024 0810   GFRNONAA 50 (L) 08/25/2020 0927   GFRAA 61 12/10/2020 0933   GFRAA 58 (L) 08/25/2020 0927     Diabetic Labs (most recent): Lab Results  Component Value Date   HGBA1C 6.2 (H) 07/26/2024   HGBA1C 6.2 (A) 04/12/2024   HGBA1C 6.9 (A) 11/25/2023   MICROALBUR 586.6 (H) 07/26/2024   MICROALBUR 150 03/25/2022   MICROALBUR 150 03/16/2021     Lipid Panel     Component Value Date/Time   CHOL 98 (L) 05/01/2024 1552   TRIG 173 (H) 05/01/2024 1552   HDL 33 (L) 05/01/2024 1552   CHOLHDL 3.0 05/01/2024 1552   CHOLHDL 3.4 08/25/2020 0927   VLDL 39 (H) 05/14/2017 0831   LDLCALC 36 05/01/2024 1552   LDLCALC 55 08/25/2020 0927   LABVLDL 29 05/01/2024 1552      Lab Results  Component Value Date   TSH 2.150 11/03/2023   TSH 1.300 03/04/2022   TSH 2.215 07/22/2021   TSH 2.100 06/10/2021   TSH 2.79 08/25/2020   TSH 1.96 08/24/2019   TSH 1.34 01/31/2018   TSH 1.61 02/10/2016   FREET4 1.14 06/10/2021   FREET4 1.0 08/25/2020     Assessment & Plan:   1) Type 2 diabetes mellitus with stage 3a chronic kidney disease, with long-term current use of insulin  (HCC)  - Dillon Livermore has currently uncontrolled symptomatic type 2 DM since 68 years of age.  He presents today with his CGM and logs, at goal glycemic profile overall.  His most recent A1c on 9/18 was 6.2%, unchanged from  previous visit.   Analysis of his CGM shows TIR 58%, TAR 42%, TBR 0% with a GMI of 7.6%.  He admits he has been snacking more at night recently.  He will take Lantus  on occasion when glucose is higher.  He was recently started on Jardiance  10 mg po daily due to proteinuria.   Recent labs reviewed.  - I had a long discussion with him about the progressive nature of diabetes and the pathology behind its complications. -his diabetes is complicated by CKD and he remains at a high risk for more acute and chronic complications which include CAD, CVA, CKD, retinopathy, and neuropathy. These are all discussed in detail with him.  - Nutritional counseling repeated/built upon at each appointment.  - The patient admits there is a room for improvement in their diet and drink choices. -  Suggestion is made for the patient to avoid simple carbohydrates from their diet including Cakes, Sweet Desserts / Pastries, Ice Cream, Soda (diet and regular), Sweet Tea, Candies, Chips, Cookies, Sweet Pastries, Store Bought Juices, Alcohol in Excess of 1-2 drinks a day, Artificial Sweeteners, Coffee Creamer, and Sugar-free Products. This will help patient to have stable blood glucose profile and potentially avoid unintended weight gain.   - I encouraged the patient to switch to unprocessed or minimally processed complex starch and increased protein intake (animal or plant source), fruits, and vegetables.   -  Patient is advised to stick to a routine mealtimes to eat 3 meals a day and avoid unnecessary snacks (to snack only to correct hypoglycemia).  - I have approached him with the following individualized plan to manage his diabetes and patient agrees:   - he will continue to benefit from basal insulin .    -He is advised to continue Ozempic  2 mg SQ weekly.  He has done well without his insulin , will keep him off for now.  I did encourage him to avoid night time snacking.  He can continue Jardiance  10 mg po daily (recently  started on this for proteinuria).  We did talk about possible side effects of this medication today.  -He is encouraged to continue monitoring blood glucose twice daily (using his CGM), before breakfast and before bed, and to call the clinic if he has readings less than 70 or greater than 200 for 3 tests in a row.   - he is warned not to take insulin  without proper monitoring per orders.  -He reports that he had developed unexplained cough while taking Glipizide  and Metformin .  After he finished his Glipizide  and Metformin , reportedly the cough has improved.  He is advised to stay off of Glipizide  and Metformin  for now.   - Specific targets for  A1c;  LDL, HDL,  and Triglycerides were discussed with the patient.  2) Blood Pressure /Hypertension:  His blood pressure is at target today.  he is advised to continue his current medications as prescribed by his PCP.  3) Lipids/Hyperlipidemia:   His most recent lipid panel from 05/01/24 shows controlled LDL of 36 and elevated triglycerides of 173.  He is advised to continue Crestor  5 mg po every other day at bedtime and Fenofibrate  48 mg po daily.  Side effects and precautions discussed with him.   4)  Weight/Diet:  His Body mass index is 31.35 kg/m.  -   clearly complicating his diabetes care.   he is a candidate for weight loss. I discussed with him the fact that loss of 5 - 10% of his  current body weight will have the most impact on his diabetes management.  Exercise, and detailed carbohydrates information provided  -  detailed on discharge instructions.  5) Chronic Care/Health Maintenance: -he is on a Statin medications and is encouraged to initiate and continue to follow up with Ophthalmology, Dentist, Podiatrist at least yearly or according to recommendations, and advised to stay away from smoking. I have recommended yearly flu vaccine and pneumonia vaccine at least every 5 years; moderate intensity exercise for up to 150 minutes weekly; and   sleep for at least 7 hours a day.  - he is advised to maintain close follow up with Antonetta Rollene BRAVO, MD for primary care needs, as well as his other providers for optimal and coordinated care.     I spent  38  minutes in the care of the patient today including review of labs from CMP, Lipids, Thyroid  Function, Hematology (current and previous including abstractions from other facilities); face-to-face time discussing  his blood glucose readings/logs, discussing hypoglycemia and hyperglycemia episodes and symptoms, medications doses, his options of short and long term treatment based on the latest standards of care / guidelines;  discussion about incorporating lifestyle medicine;  and documenting the encounter. Risk reduction counseling performed per USPSTF guidelines to reduce obesity and cardiovascular risk factors.     Please refer to Patient Instructions for Blood Glucose Monitoring and Insulin /Medications Dosing Guide  in  media tab for additional information. Please  also refer to  Patient Self Inventory in the Media  tab for reviewed elements of pertinent patient history.  Ryan Seip participated in the discussions, expressed understanding, and voiced agreement with the above plans.  All questions were answered to his satisfaction. he is encouraged to contact clinic should he have any questions or concerns prior to his return visit.   Follow up plan: - Return in about 6 months (around 04/12/2025) for Diabetes F/U with A1c in office, No previsit labs.  Benton Rio, [facility] [address] [place], [place] [address] Phone: [phone] Fax: [phone]  10/12/2024, 8:59 AM

## 2024-10-18 LAB — HM DIABETES EYE EXAM

## 2024-10-20 ENCOUNTER — Other Ambulatory Visit: Payer: Self-pay | Admitting: Family Medicine

## 2024-10-28 ENCOUNTER — Other Ambulatory Visit: Payer: Self-pay | Admitting: Family Medicine

## 2024-10-31 ENCOUNTER — Other Ambulatory Visit: Payer: Self-pay | Admitting: Family Medicine

## 2024-11-05 ENCOUNTER — Encounter: Payer: Self-pay | Admitting: *Deleted

## 2024-11-23 ENCOUNTER — Other Ambulatory Visit (HOSPITAL_COMMUNITY)
Admission: RE | Admit: 2024-11-23 | Discharge: 2024-11-23 | Disposition: A | Discharge status: Home / Self Care | Source: Ambulatory Visit | Attending: Nephrology | Admitting: Nephrology

## 2024-11-23 DIAGNOSIS — N189 Chronic kidney disease, unspecified: Secondary | ICD-10-CM | POA: Diagnosis present

## 2024-11-23 LAB — RENAL FUNCTION PANEL
Albumin: 4.3 g/dL (ref 3.5–5.0)
Anion gap: 12 (ref 5–15)
BUN: 26 mg/dL — ABNORMAL HIGH (ref 8–23)
CO2: 26 mmol/L (ref 22–32)
Calcium: 10 mg/dL (ref 8.9–10.3)
Chloride: 101 mmol/L (ref 98–111)
Creatinine, Ser: 1.69 mg/dL — ABNORMAL HIGH (ref 0.61–1.24)
GFR, Estimated: 44 mL/min — ABNORMAL LOW
Glucose, Bld: 138 mg/dL — ABNORMAL HIGH (ref 70–99)
Phosphorus: 3.6 mg/dL (ref 2.5–4.6)
Potassium: 4.2 mmol/L (ref 3.5–5.1)
Sodium: 139 mmol/L (ref 135–145)

## 2024-11-23 LAB — CBC
HCT: 48.1 % (ref 39.0–52.0)
Hemoglobin: 15.7 g/dL (ref 13.0–17.0)
MCH: 29.6 pg (ref 26.0–34.0)
MCHC: 32.6 g/dL (ref 30.0–36.0)
MCV: 90.8 fL (ref 80.0–100.0)
Platelets: 259 K/uL (ref 150–400)
RBC: 5.3 MIL/uL (ref 4.22–5.81)
RDW: 14.2 % (ref 11.5–15.5)
WBC: 10.7 K/uL — ABNORMAL HIGH (ref 4.0–10.5)
nRBC: 0 % (ref 0.0–0.2)

## 2024-11-23 LAB — PROTEIN / CREATININE RATIO, URINE
Creatinine, Urine: 238 mg/dL
Protein Creatinine Ratio: 0.1 mg/mg
Total Protein, Urine: 19 mg/dL

## 2024-12-05 ENCOUNTER — Other Ambulatory Visit: Payer: Self-pay | Admitting: Family Medicine

## 2024-12-10 ENCOUNTER — Ambulatory Visit: Admitting: Orthopedic Surgery

## 2025-04-12 ENCOUNTER — Ambulatory Visit: Admitting: Nurse Practitioner

## 2025-05-01 ENCOUNTER — Ambulatory Visit
